# Patient Record
Sex: Female | Born: 1940 | Race: White | Hispanic: No | Marital: Married | Smoking: Former smoker
Health system: Southern US, Community
[De-identification: ages and names within clinical notes are randomized; demographics above are authoritative.]

## PROBLEM LIST (undated history)

## (undated) DIAGNOSIS — H35039 Hypertensive retinopathy, unspecified eye: Secondary | ICD-10-CM

## (undated) DIAGNOSIS — E7800 Pure hypercholesterolemia, unspecified: Secondary | ICD-10-CM

## (undated) DIAGNOSIS — H3530 Unspecified macular degeneration: Secondary | ICD-10-CM

## (undated) DIAGNOSIS — F32A Depression, unspecified: Secondary | ICD-10-CM

## (undated) DIAGNOSIS — J302 Other seasonal allergic rhinitis: Secondary | ICD-10-CM

## (undated) DIAGNOSIS — F419 Anxiety disorder, unspecified: Secondary | ICD-10-CM

## (undated) DIAGNOSIS — F329 Major depressive disorder, single episode, unspecified: Secondary | ICD-10-CM

## (undated) DIAGNOSIS — M1990 Unspecified osteoarthritis, unspecified site: Secondary | ICD-10-CM

## (undated) DIAGNOSIS — R112 Nausea with vomiting, unspecified: Secondary | ICD-10-CM

## (undated) DIAGNOSIS — H269 Unspecified cataract: Secondary | ICD-10-CM

## (undated) DIAGNOSIS — E039 Hypothyroidism, unspecified: Secondary | ICD-10-CM

## (undated) DIAGNOSIS — Z9889 Other specified postprocedural states: Secondary | ICD-10-CM

## (undated) DIAGNOSIS — I10 Essential (primary) hypertension: Secondary | ICD-10-CM

## (undated) HISTORY — DX: Pure hypercholesterolemia, unspecified: E78.00

## (undated) HISTORY — PX: UTERINE FIBROID SURGERY: SHX826

## (undated) HISTORY — DX: Depression, unspecified: F32.A

## (undated) HISTORY — DX: Hypertensive retinopathy, unspecified eye: H35.039

## (undated) HISTORY — DX: Unspecified osteoarthritis, unspecified site: M19.90

## (undated) HISTORY — DX: Essential (primary) hypertension: I10

## (undated) HISTORY — DX: Unspecified macular degeneration: H35.30

## (undated) HISTORY — DX: Hypothyroidism, unspecified: E03.9

## (undated) HISTORY — DX: Other seasonal allergic rhinitis: J30.2

## (undated) HISTORY — DX: Unspecified cataract: H26.9

## (undated) HISTORY — DX: Major depressive disorder, single episode, unspecified: F32.9

## (undated) HISTORY — DX: Anxiety disorder, unspecified: F41.9

## (undated) HISTORY — PX: TUBAL LIGATION: SHX77

---

## 1999-03-03 ENCOUNTER — Other Ambulatory Visit: Admission: RE | Admit: 1999-03-03 | Discharge: 1999-03-03 | Payer: Self-pay | Admitting: Obstetrics and Gynecology

## 1999-10-25 ENCOUNTER — Other Ambulatory Visit: Admission: RE | Admit: 1999-10-25 | Discharge: 1999-10-25 | Payer: Self-pay | Admitting: Obstetrics and Gynecology

## 2000-09-20 ENCOUNTER — Ambulatory Visit (HOSPITAL_COMMUNITY): Admission: RE | Admit: 2000-09-20 | Discharge: 2000-09-20 | Payer: Self-pay | Admitting: Obstetrics and Gynecology

## 2000-09-20 ENCOUNTER — Encounter (INDEPENDENT_AMBULATORY_CARE_PROVIDER_SITE_OTHER): Payer: Self-pay | Admitting: Specialist

## 2002-01-01 ENCOUNTER — Other Ambulatory Visit: Admission: RE | Admit: 2002-01-01 | Discharge: 2002-01-01 | Payer: Self-pay | Admitting: Obstetrics and Gynecology

## 2002-10-16 ENCOUNTER — Encounter: Payer: Self-pay | Admitting: Obstetrics and Gynecology

## 2002-10-16 ENCOUNTER — Encounter: Admission: RE | Admit: 2002-10-16 | Discharge: 2002-10-16 | Payer: Self-pay | Admitting: Obstetrics and Gynecology

## 2003-02-17 ENCOUNTER — Other Ambulatory Visit: Admission: RE | Admit: 2003-02-17 | Discharge: 2003-02-17 | Payer: Self-pay | Admitting: Obstetrics and Gynecology

## 2004-03-10 ENCOUNTER — Encounter: Admission: RE | Admit: 2004-03-10 | Discharge: 2004-03-10 | Payer: Self-pay | Admitting: Obstetrics and Gynecology

## 2005-07-13 ENCOUNTER — Encounter: Admission: RE | Admit: 2005-07-13 | Discharge: 2005-07-13 | Payer: Self-pay | Admitting: Obstetrics and Gynecology

## 2005-08-03 ENCOUNTER — Encounter: Admission: RE | Admit: 2005-08-03 | Discharge: 2005-08-03 | Payer: Self-pay | Admitting: Obstetrics and Gynecology

## 2007-01-03 ENCOUNTER — Encounter: Admission: RE | Admit: 2007-01-03 | Discharge: 2007-01-03 | Payer: Self-pay | Admitting: Obstetrics and Gynecology

## 2007-01-17 ENCOUNTER — Encounter: Admission: RE | Admit: 2007-01-17 | Discharge: 2007-01-17 | Payer: Self-pay | Admitting: Obstetrics and Gynecology

## 2008-01-30 ENCOUNTER — Encounter: Admission: RE | Admit: 2008-01-30 | Discharge: 2008-01-30 | Payer: Self-pay | Admitting: Obstetrics and Gynecology

## 2008-08-16 IMAGING — MG MM SCREEN MAMMOGRAM BILATERAL
4 series · 4 of 4 positions shown · non-contrast
Comparison: none

DG SCREEN MAMMOGRAM BILATERAL
Bilateral CC and MLO view(s) were taken.
Technologist: Ji, Anna Lia([email or web address])

DIGITAL SCREENING MAMMOGRAM WITH CAD:
There is a fibroglandular pattern.  A possible mass is noted in the left breast.  Spot compression 
views and possibly sonography are recommended for further evaluation.  In the right breast, no 
masses or malignant type calcifications are identified.  Compared with prior studies.

[R CC]
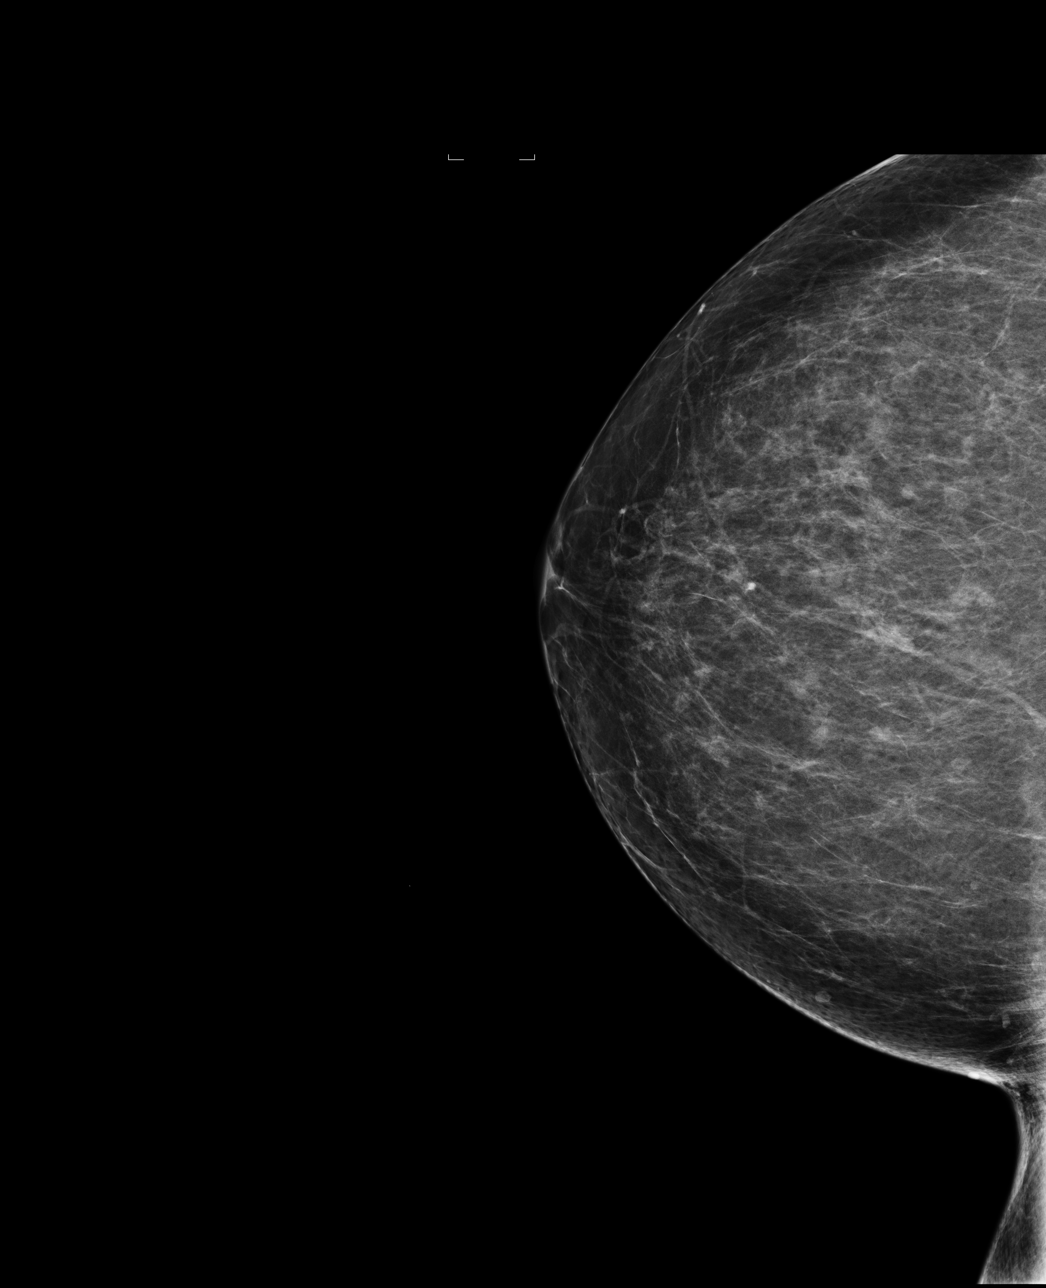

[L CC]
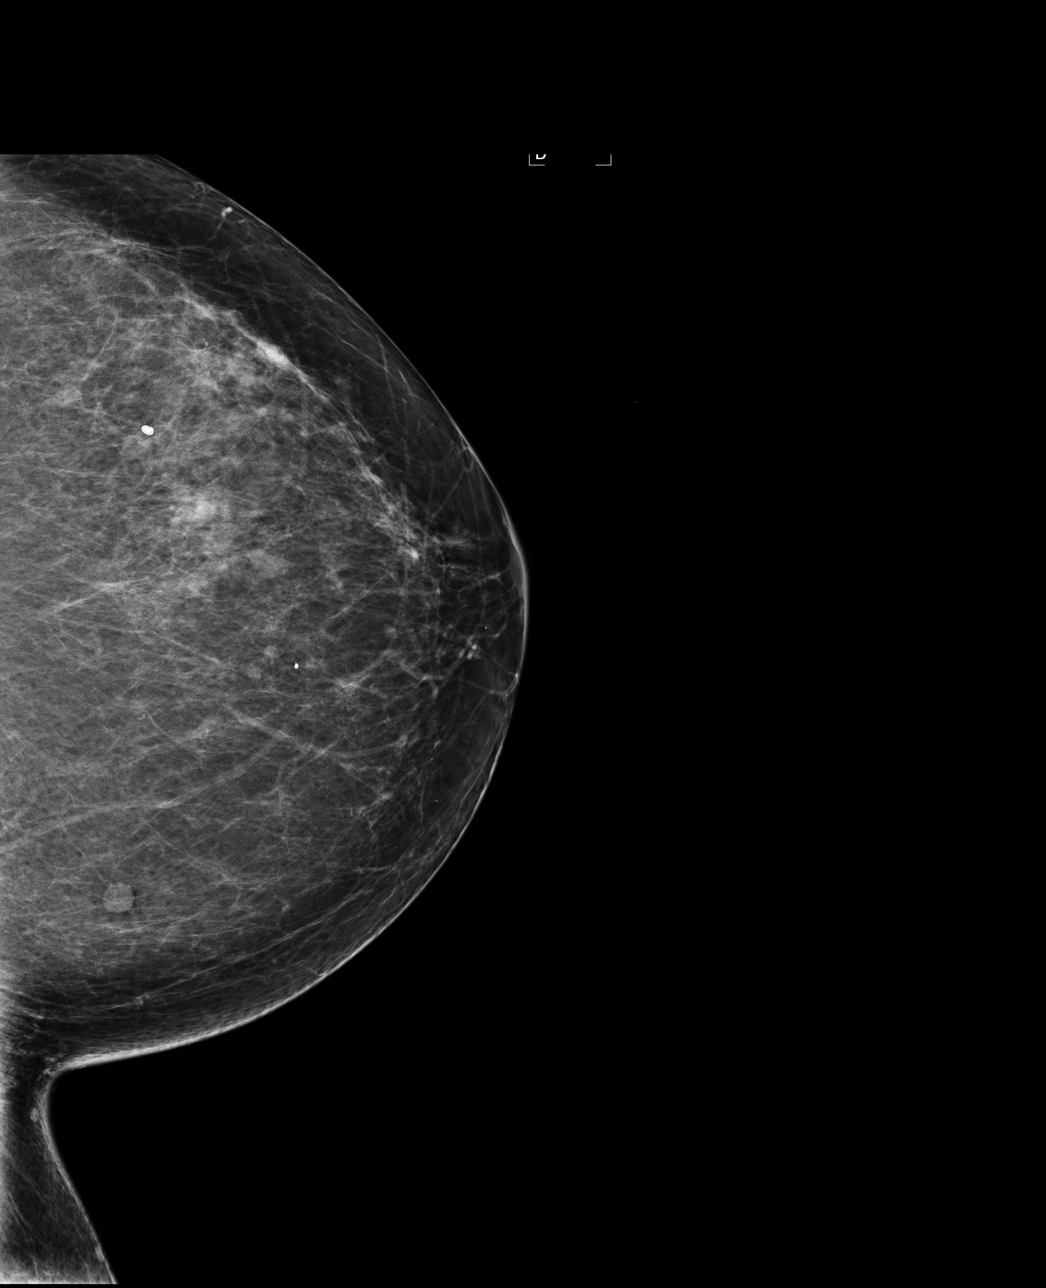

[L MLO]
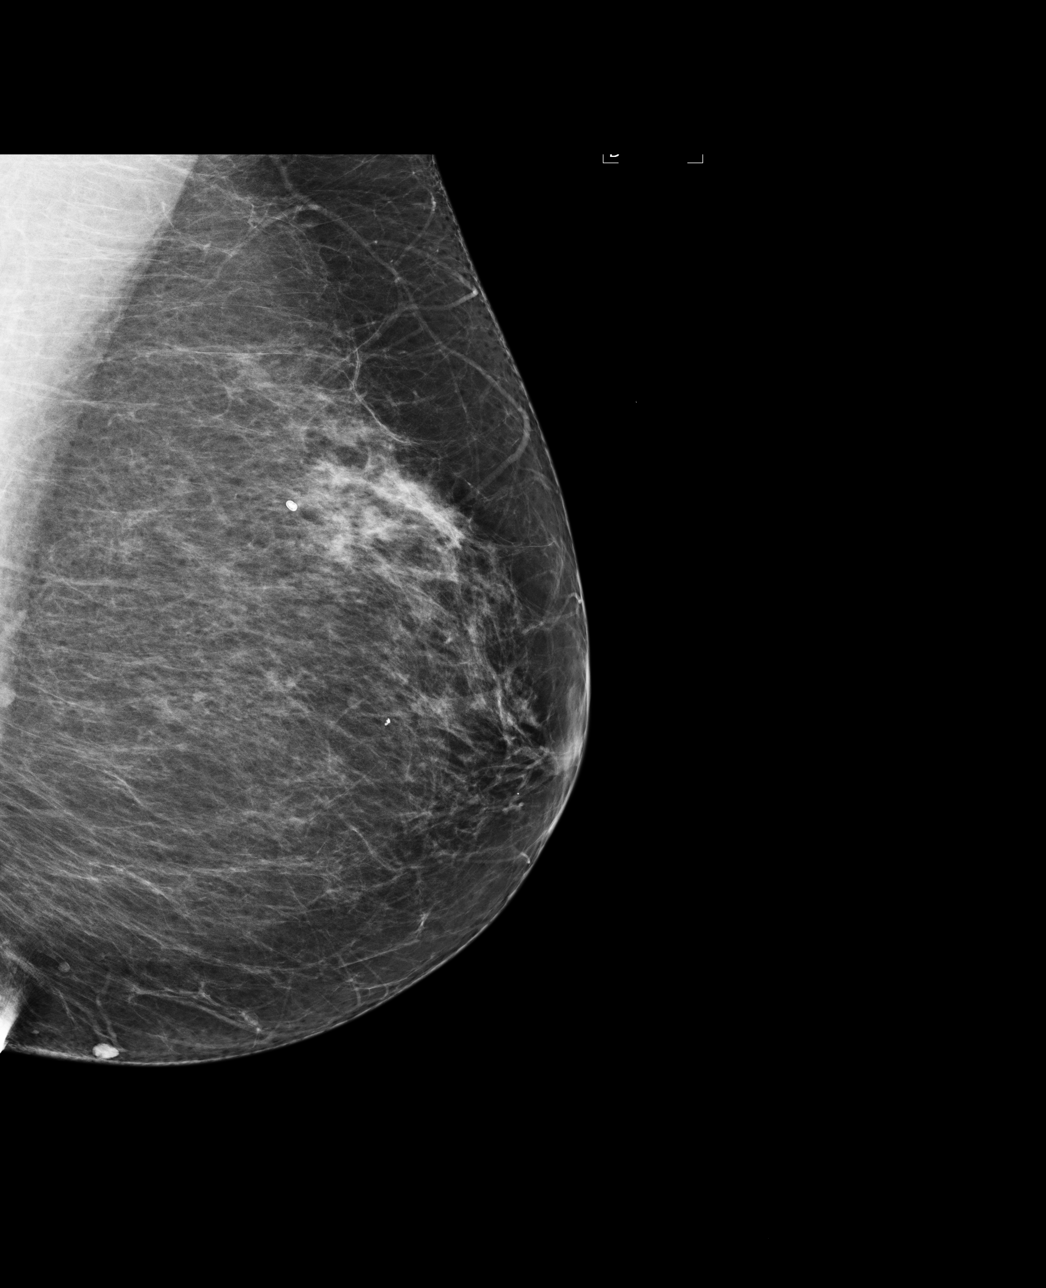

[R MLO]
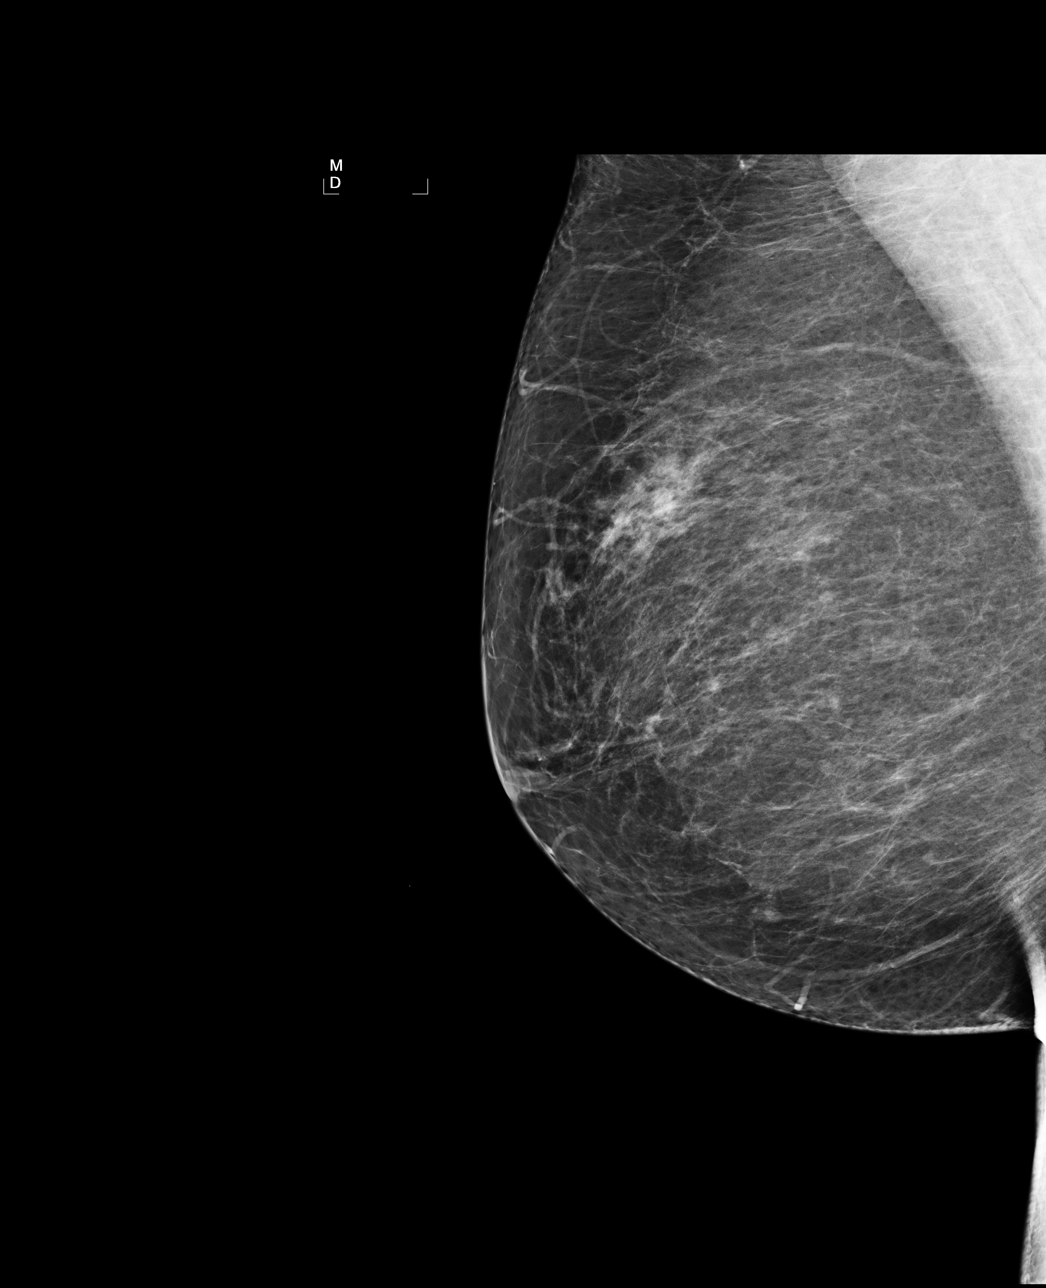

[4 of 4 positions shown; findings below may reference images not displayed]

IMPRESSION: Possible mass, left breast.  Additional evaluation is indicated.  The patient will be contacted for
additional studies and a supplementary report will follow.  No specific mammographic evidence of 
malignancy, right breast.

ASSESSMENT: Need additional imaging evaluation and/or prior mammograms for comparison - BI-RADS 0

Further imaging of the left breast.
ANALYZED BY COMPUTER AIDED DETECTION. , THIS PROCEDURE WAS A DIGITAL MAMMOGRAM.

## 2009-03-27 ENCOUNTER — Encounter: Admission: RE | Admit: 2009-03-27 | Discharge: 2009-03-27 | Payer: Self-pay | Admitting: Obstetrics and Gynecology

## 2010-04-02 ENCOUNTER — Encounter: Admission: RE | Admit: 2010-04-02 | Discharge: 2010-04-02 | Payer: Self-pay | Admitting: Obstetrics and Gynecology

## 2010-09-18 ENCOUNTER — Encounter: Payer: Self-pay | Admitting: Obstetrics and Gynecology

## 2010-09-19 ENCOUNTER — Encounter: Payer: Self-pay | Admitting: Obstetrics and Gynecology

## 2011-01-14 NOTE — Op Note (Signed)
[facility]  Patient:    Darlene Crawford, Darlene Crawford                        MRN: [id number] Proc. Date: 09/20/00 Attending:  Cordelia Pen A. Rosalio Macadamia, M.D.                           Operative Report  PREOPERATIVE DIAGNOSES:       1. Postmenopausal bleeding.                               2. Submucosal fibroid.   POSTOPERATIVE DIAGNOSES:      1. Postmenopausal bleeding.                               2. Submucosal fibroid.                               3. Endometrial polyp.  OPERATION/PROCEDURE:          1. Dilatation and curettage.                               2. Hysteroscopy.                               3. Resectoscopic excision of endometrial polyp                                  and submucosal fibroid.  SURGEON:                      Sherry A. Rosalio Macadamia, M.D.  ANESTHESIA:                   General.  INDICATIONS FOR PROCEDURE:    This patient is a 70 year old G5 P3, 0-2-2, female who has menstrual periods every month lasting six to seven days, on hormone replacement therapy.  The patient initially tried continuous hormone replacement therapy; however, she had ongoing bleeding.  Because of the ongoing bleeding she was changed to cyclical estrogen replacement therapy. Even on the estrogen replacement therapy in this fashion the patient had irregular bleeding between periods as well as her periods.  Because of that the patient underwent an ultrasound and the ultrasound revealed a 2 cm mass in the endometrial cavity.  Because of this the patient is brought to the operating room for dilatation and curettage, hysteroscopy, with resectoscope.  FINDINGS:                     1. Mid position normal size uterus.                               2. No adnexal mass.                               3. Posterior wall submucosal fibroid,  approximately 2 cm.                               4. Endometrial polyp present as well.  DESCRIPTION OF  PROCEDURE:     The patient was brought to the operating room and given adequate general anesthesia, and was placed in the dorsal lithotomy position on the operating room table.  The perineum and vagina were washed with Betadine and the bladder was in-and-out catheterized.  The patient was draped in sterile fashion and pelvic examination performed.  A speculum was placed within the vagina and the vagina washed with Betadine.  The anterior lip of the cervix was grasped with a single-tooth tenaculum and the cervix sounded.  The cervix was dilated with Shawnie Pons dilators to a #31 and the hysteroscope was easily introduced into the endometrial cavity.  Pictures were obtained.  Using a double loop right angle resector the endometrial polyp was removed first.  The submucosal fibroid was then removed in sheets.  Samples of the remaining portion of the endometrium were then excised.  Adequate hemostasis was present.  A final picture could not be obtained because the camera had gotten slightly wet and made it difficult to get an adequate picture.  Adequate hemostasis was present, however.  The resectoscope and tenaculum were removed from the vagina.  A small amount of bleeding from the tenaculum site was noted and Monsels solution was used to stop this bleeding. Adequate hemostasis was present.  All instruments were removed from the vagina.  The patient was taken out of the dorsal lithotomy position and was awakened.  She was moved from the operating room table to a stretcher in stable condition.  There were no complications.  Estimated blood loss was 5 cc.  Sorbitol differential was -90. DD:  09/20/00 TD:  09/20/00 Job: [id number]

## 2011-04-01 ENCOUNTER — Other Ambulatory Visit: Payer: Self-pay | Admitting: Internal Medicine

## 2011-04-01 DIAGNOSIS — Z1231 Encounter for screening mammogram for malignant neoplasm of breast: Secondary | ICD-10-CM

## 2011-04-22 ENCOUNTER — Ambulatory Visit
Admission: RE | Admit: 2011-04-22 | Discharge: 2011-04-22 | Disposition: A | Discharge status: Home / Self Care | Payer: PRIVATE HEALTH INSURANCE | Source: Ambulatory Visit | Attending: Internal Medicine | Admitting: Internal Medicine

## 2011-04-22 DIAGNOSIS — Z1231 Encounter for screening mammogram for malignant neoplasm of breast: Secondary | ICD-10-CM

## 2011-04-26 ENCOUNTER — Other Ambulatory Visit: Payer: Self-pay | Admitting: Internal Medicine

## 2011-04-26 DIAGNOSIS — R928 Other abnormal and inconclusive findings on diagnostic imaging of breast: Secondary | ICD-10-CM

## 2011-05-03 ENCOUNTER — Ambulatory Visit
Admission: RE | Admit: 2011-05-03 | Discharge: 2011-05-03 | Disposition: A | Discharge status: Home / Self Care | Payer: PRIVATE HEALTH INSURANCE | Source: Ambulatory Visit | Attending: Internal Medicine | Admitting: Internal Medicine

## 2011-05-03 ENCOUNTER — Other Ambulatory Visit: Payer: Self-pay | Admitting: Internal Medicine

## 2011-05-03 DIAGNOSIS — R928 Other abnormal and inconclusive findings on diagnostic imaging of breast: Secondary | ICD-10-CM

## 2012-04-27 ENCOUNTER — Other Ambulatory Visit: Payer: Self-pay | Admitting: Internal Medicine

## 2012-04-27 DIAGNOSIS — R928 Other abnormal and inconclusive findings on diagnostic imaging of breast: Secondary | ICD-10-CM

## 2012-04-27 DIAGNOSIS — N6320 Unspecified lump in the left breast, unspecified quadrant: Secondary | ICD-10-CM

## 2012-05-17 ENCOUNTER — Ambulatory Visit
Admission: RE | Admit: 2012-05-17 | Discharge: 2012-05-17 | Disposition: A | Discharge status: Home / Self Care | Payer: Medicare Other | Source: Ambulatory Visit | Attending: Internal Medicine | Admitting: Internal Medicine

## 2012-05-17 ENCOUNTER — Other Ambulatory Visit: Payer: Self-pay | Admitting: Internal Medicine

## 2012-05-17 DIAGNOSIS — Z1231 Encounter for screening mammogram for malignant neoplasm of breast: Secondary | ICD-10-CM

## 2012-05-17 DIAGNOSIS — R928 Other abnormal and inconclusive findings on diagnostic imaging of breast: Secondary | ICD-10-CM

## 2012-05-17 DIAGNOSIS — N6320 Unspecified lump in the left breast, unspecified quadrant: Secondary | ICD-10-CM

## 2013-07-09 ENCOUNTER — Other Ambulatory Visit (HOSPITAL_COMMUNITY): Payer: Self-pay | Admitting: Internal Medicine

## 2013-07-09 DIAGNOSIS — Z139 Encounter for screening, unspecified: Secondary | ICD-10-CM

## 2013-07-16 ENCOUNTER — Ambulatory Visit (HOSPITAL_COMMUNITY): Payer: Medicare Other

## 2013-08-01 ENCOUNTER — Ambulatory Visit (HOSPITAL_COMMUNITY)
Admission: RE | Admit: 2013-08-01 | Discharge: 2013-08-01 | Disposition: A | Discharge status: Home / Self Care | Payer: Medicare Other | Source: Ambulatory Visit | Attending: Internal Medicine | Admitting: Internal Medicine

## 2013-08-01 DIAGNOSIS — Z139 Encounter for screening, unspecified: Secondary | ICD-10-CM

## 2013-08-01 DIAGNOSIS — Z1231 Encounter for screening mammogram for malignant neoplasm of breast: Secondary | ICD-10-CM | POA: Insufficient documentation

## 2013-08-05 ENCOUNTER — Other Ambulatory Visit: Payer: Self-pay | Admitting: Internal Medicine

## 2013-08-05 DIAGNOSIS — R928 Other abnormal and inconclusive findings on diagnostic imaging of breast: Secondary | ICD-10-CM

## 2013-08-09 ENCOUNTER — Telehealth: Payer: Self-pay

## 2013-08-09 NOTE — Telephone Encounter (Signed)
Pt was referred by Dr. Sherryll Burger for screening colonoscopy. She said she had one about 20 years ago by a Careers adviser, can't remember his name, think he was in the office with Dr. Gabriel Cirri.   She is not having any problems and she is taking care of her husband and son who have medical problems.  Said she will call first of the year when she can schedule. I will let Dr. Sherryll Burger know.

## 2013-08-28 ENCOUNTER — Ambulatory Visit (HOSPITAL_COMMUNITY)
Admission: RE | Admit: 2013-08-28 | Discharge: 2013-08-28 | Disposition: A | Discharge status: Home / Self Care | Payer: Medicare Other | Source: Ambulatory Visit | Attending: Internal Medicine | Admitting: Internal Medicine

## 2013-08-28 ENCOUNTER — Other Ambulatory Visit: Payer: Self-pay | Admitting: Internal Medicine

## 2013-08-28 DIAGNOSIS — R928 Other abnormal and inconclusive findings on diagnostic imaging of breast: Secondary | ICD-10-CM

## 2013-09-02 ENCOUNTER — Telehealth: Payer: Self-pay

## 2013-09-02 NOTE — Telephone Encounter (Signed)
LMOM to call.

## 2013-09-24 NOTE — Telephone Encounter (Signed)
Letter reminder sent to pt to call to schedule colonoscopy.

## 2014-03-31 ENCOUNTER — Other Ambulatory Visit (HOSPITAL_COMMUNITY): Payer: Self-pay | Admitting: Nurse Practitioner

## 2014-03-31 DIAGNOSIS — R928 Other abnormal and inconclusive findings on diagnostic imaging of breast: Secondary | ICD-10-CM

## 2014-04-22 ENCOUNTER — Ambulatory Visit (HOSPITAL_COMMUNITY)
Admission: RE | Admit: 2014-04-22 | Discharge: 2014-04-22 | Disposition: A | Discharge status: Home / Self Care | Payer: Medicare HMO | Source: Ambulatory Visit | Attending: Nurse Practitioner | Admitting: Nurse Practitioner

## 2014-04-22 DIAGNOSIS — R928 Other abnormal and inconclusive findings on diagnostic imaging of breast: Secondary | ICD-10-CM | POA: Diagnosis not present

## 2016-04-01 ENCOUNTER — Other Ambulatory Visit (HOSPITAL_COMMUNITY): Payer: Self-pay | Admitting: Nurse Practitioner

## 2016-04-01 DIAGNOSIS — Z1231 Encounter for screening mammogram for malignant neoplasm of breast: Secondary | ICD-10-CM

## 2016-04-11 ENCOUNTER — Ambulatory Visit (HOSPITAL_COMMUNITY)
Admission: RE | Admit: 2016-04-11 | Discharge: 2016-04-11 | Disposition: A | Discharge status: Home / Self Care | Payer: Medicare HMO | Source: Ambulatory Visit | Attending: Nurse Practitioner | Admitting: Nurse Practitioner

## 2016-04-11 DIAGNOSIS — Z1231 Encounter for screening mammogram for malignant neoplasm of breast: Secondary | ICD-10-CM | POA: Insufficient documentation

## 2016-11-17 DIAGNOSIS — E7800 Pure hypercholesterolemia, unspecified: Secondary | ICD-10-CM | POA: Diagnosis not present

## 2016-11-17 DIAGNOSIS — I10 Essential (primary) hypertension: Secondary | ICD-10-CM | POA: Diagnosis not present

## 2016-11-17 DIAGNOSIS — R69 Illness, unspecified: Secondary | ICD-10-CM | POA: Diagnosis not present

## 2016-11-17 DIAGNOSIS — E039 Hypothyroidism, unspecified: Secondary | ICD-10-CM | POA: Diagnosis not present

## 2016-11-17 DIAGNOSIS — Z713 Dietary counseling and surveillance: Secondary | ICD-10-CM | POA: Diagnosis not present

## 2016-11-17 DIAGNOSIS — Z6831 Body mass index (BMI) 31.0-31.9, adult: Secondary | ICD-10-CM | POA: Diagnosis not present

## 2016-11-17 DIAGNOSIS — Z299 Encounter for prophylactic measures, unspecified: Secondary | ICD-10-CM | POA: Diagnosis not present

## 2017-01-11 DIAGNOSIS — R69 Illness, unspecified: Secondary | ICD-10-CM | POA: Diagnosis not present

## 2017-02-21 DIAGNOSIS — E039 Hypothyroidism, unspecified: Secondary | ICD-10-CM | POA: Diagnosis not present

## 2017-02-21 DIAGNOSIS — Z299 Encounter for prophylactic measures, unspecified: Secondary | ICD-10-CM | POA: Diagnosis not present

## 2017-02-21 DIAGNOSIS — M8580 Other specified disorders of bone density and structure, unspecified site: Secondary | ICD-10-CM | POA: Diagnosis not present

## 2017-02-21 DIAGNOSIS — I10 Essential (primary) hypertension: Secondary | ICD-10-CM | POA: Diagnosis not present

## 2017-02-21 DIAGNOSIS — R69 Illness, unspecified: Secondary | ICD-10-CM | POA: Diagnosis not present

## 2017-02-21 DIAGNOSIS — E7800 Pure hypercholesterolemia, unspecified: Secondary | ICD-10-CM | POA: Diagnosis not present

## 2017-02-21 DIAGNOSIS — Z1389 Encounter for screening for other disorder: Secondary | ICD-10-CM | POA: Diagnosis not present

## 2017-02-21 DIAGNOSIS — E559 Vitamin D deficiency, unspecified: Secondary | ICD-10-CM | POA: Diagnosis not present

## 2017-02-21 DIAGNOSIS — Z7189 Other specified counseling: Secondary | ICD-10-CM | POA: Diagnosis not present

## 2017-02-21 DIAGNOSIS — Z79899 Other long term (current) drug therapy: Secondary | ICD-10-CM | POA: Diagnosis not present

## 2017-02-21 DIAGNOSIS — Z87891 Personal history of nicotine dependence: Secondary | ICD-10-CM | POA: Diagnosis not present

## 2017-02-21 DIAGNOSIS — Z0000 Encounter for general adult medical examination without abnormal findings: Secondary | ICD-10-CM | POA: Diagnosis not present

## 2017-02-21 DIAGNOSIS — Z6831 Body mass index (BMI) 31.0-31.9, adult: Secondary | ICD-10-CM | POA: Diagnosis not present

## 2017-03-16 DIAGNOSIS — R69 Illness, unspecified: Secondary | ICD-10-CM | POA: Diagnosis not present

## 2017-05-22 ENCOUNTER — Other Ambulatory Visit (HOSPITAL_COMMUNITY): Payer: Self-pay | Admitting: Nurse Practitioner

## 2017-05-22 DIAGNOSIS — E039 Hypothyroidism, unspecified: Secondary | ICD-10-CM | POA: Diagnosis not present

## 2017-05-22 DIAGNOSIS — Z6832 Body mass index (BMI) 32.0-32.9, adult: Secondary | ICD-10-CM | POA: Diagnosis not present

## 2017-05-22 DIAGNOSIS — Z1231 Encounter for screening mammogram for malignant neoplasm of breast: Secondary | ICD-10-CM

## 2017-05-22 DIAGNOSIS — E7800 Pure hypercholesterolemia, unspecified: Secondary | ICD-10-CM | POA: Diagnosis not present

## 2017-05-22 DIAGNOSIS — R69 Illness, unspecified: Secondary | ICD-10-CM | POA: Diagnosis not present

## 2017-05-22 DIAGNOSIS — I10 Essential (primary) hypertension: Secondary | ICD-10-CM | POA: Diagnosis not present

## 2017-05-22 DIAGNOSIS — Z299 Encounter for prophylactic measures, unspecified: Secondary | ICD-10-CM | POA: Diagnosis not present

## 2017-05-26 ENCOUNTER — Ambulatory Visit (HOSPITAL_COMMUNITY): Payer: Medicare HMO

## 2017-06-01 ENCOUNTER — Ambulatory Visit (HOSPITAL_COMMUNITY)
Admission: RE | Admit: 2017-06-01 | Discharge: 2017-06-01 | Disposition: A | Discharge status: Home / Self Care | Payer: Medicare HMO | Source: Ambulatory Visit | Attending: Nurse Practitioner | Admitting: Nurse Practitioner

## 2017-06-01 DIAGNOSIS — Z1231 Encounter for screening mammogram for malignant neoplasm of breast: Secondary | ICD-10-CM | POA: Insufficient documentation

## 2017-08-02 DIAGNOSIS — H2513 Age-related nuclear cataract, bilateral: Secondary | ICD-10-CM | POA: Diagnosis not present

## 2017-08-02 DIAGNOSIS — H5203 Hypermetropia, bilateral: Secondary | ICD-10-CM | POA: Diagnosis not present

## 2017-08-02 DIAGNOSIS — I10 Essential (primary) hypertension: Secondary | ICD-10-CM | POA: Diagnosis not present

## 2017-08-02 DIAGNOSIS — D3131 Benign neoplasm of right choroid: Secondary | ICD-10-CM | POA: Diagnosis not present

## 2017-08-02 DIAGNOSIS — H353121 Nonexudative age-related macular degeneration, left eye, early dry stage: Secondary | ICD-10-CM | POA: Diagnosis not present

## 2017-08-02 DIAGNOSIS — H35033 Hypertensive retinopathy, bilateral: Secondary | ICD-10-CM | POA: Diagnosis not present

## 2017-08-02 DIAGNOSIS — H353112 Nonexudative age-related macular degeneration, right eye, intermediate dry stage: Secondary | ICD-10-CM | POA: Diagnosis not present

## 2017-08-02 DIAGNOSIS — H52223 Regular astigmatism, bilateral: Secondary | ICD-10-CM | POA: Diagnosis not present

## 2017-08-02 DIAGNOSIS — H524 Presbyopia: Secondary | ICD-10-CM | POA: Diagnosis not present

## 2017-08-02 DIAGNOSIS — H35463 Secondary vitreoretinal degeneration, bilateral: Secondary | ICD-10-CM | POA: Diagnosis not present

## 2017-08-31 DIAGNOSIS — E7800 Pure hypercholesterolemia, unspecified: Secondary | ICD-10-CM | POA: Diagnosis not present

## 2017-08-31 DIAGNOSIS — Z87891 Personal history of nicotine dependence: Secondary | ICD-10-CM | POA: Diagnosis not present

## 2017-08-31 DIAGNOSIS — Z299 Encounter for prophylactic measures, unspecified: Secondary | ICD-10-CM | POA: Diagnosis not present

## 2017-08-31 DIAGNOSIS — R69 Illness, unspecified: Secondary | ICD-10-CM | POA: Diagnosis not present

## 2017-08-31 DIAGNOSIS — Z6832 Body mass index (BMI) 32.0-32.9, adult: Secondary | ICD-10-CM | POA: Diagnosis not present

## 2017-08-31 DIAGNOSIS — M5030 Other cervical disc degeneration, unspecified cervical region: Secondary | ICD-10-CM | POA: Diagnosis not present

## 2017-08-31 DIAGNOSIS — I10 Essential (primary) hypertension: Secondary | ICD-10-CM | POA: Diagnosis not present

## 2017-08-31 DIAGNOSIS — E039 Hypothyroidism, unspecified: Secondary | ICD-10-CM | POA: Diagnosis not present

## 2017-09-26 DIAGNOSIS — H35463 Secondary vitreoretinal degeneration, bilateral: Secondary | ICD-10-CM | POA: Diagnosis not present

## 2017-09-26 DIAGNOSIS — D3131 Benign neoplasm of right choroid: Secondary | ICD-10-CM | POA: Diagnosis not present

## 2017-09-26 DIAGNOSIS — H2513 Age-related nuclear cataract, bilateral: Secondary | ICD-10-CM | POA: Diagnosis not present

## 2017-09-26 DIAGNOSIS — H35412 Lattice degeneration of retina, left eye: Secondary | ICD-10-CM | POA: Diagnosis not present

## 2017-09-26 DIAGNOSIS — H353121 Nonexudative age-related macular degeneration, left eye, early dry stage: Secondary | ICD-10-CM | POA: Diagnosis not present

## 2017-09-26 DIAGNOSIS — H353112 Nonexudative age-related macular degeneration, right eye, intermediate dry stage: Secondary | ICD-10-CM | POA: Diagnosis not present

## 2017-09-26 DIAGNOSIS — H35021 Exudative retinopathy, right eye: Secondary | ICD-10-CM | POA: Diagnosis not present

## 2017-09-28 NOTE — Progress Notes (Signed)
[facility] Note  09/29/2017     CHIEF COMPLAINT Patient presents for Retina Evaluation   HISTORY OF PRESENT ILLNESS: Darlene Crawford is a 77 y.o. female who presents to the clinic today for:   HPI    Retina Evaluation    In both eyes.  This started 1 week ago.  Duration of 24 hours.  Associated Symptoms Distortion.  Negative for Floaters, Redness, Scalp Tenderness, Weight Loss, Fever, Trauma, Pain, Flashes, Photophobia, Jaw Claudication, Fatigue, Blind Spot, Glare and Shoulder/Hip pain.  Context:  distance vision, mid-range vision, near vision, reading, watching TV and computer work.  Treatments tried include artificial tears.  Response to treatment was no improvement.  I, the attending physician,  performed the HPI with the patient and updated documentation appropriately.          Comments    Referral of Dr. Rosana Hoes for retina evaluation. Patient states for appx. one week she has noticed  discoloration in her central vision of the right eye. Denies wavy vision, glare, ocular pain. Pt uses Dry eye Gtts Prn. Pt takes PreserVision QD       Last edited by Bernarda Caffey, MD on 09/29/2017 11:07 AM. (History)    Pt states she saw Dr. Rosana Hoes due to having a change in [place] grid; Pt states she was dx with ARMD OU x 1 year ago; Pt reports bing on medication for HTN, elevated chol, and thyroid; Pt states she began taking an antiinflammatories for arthritis; Pt reports she is also taking melatonin for a sleep aid; Pt states she had an eye exam in December and states "everything was normal"; Pt states BP is "usually normal";   Referring physician: Leticia Clas, DO [address] [place]. [place], [place] [address]  HISTORICAL INFORMATION:   Selected notes from the MEDICAL RECORD NUMBER Referred by Dr. Leander Rams for concern of ARMD OD with progression to CNVM vs CSR;  LEE- 01.29.19 (R. Davis) [BCVA OD: 20/100 OS: 20/30] Ocular Hx- AMD OU, cataract OU, DES OU, HTN  retinopathy OU, WWP OU, choroidal nevus OD;  PMH- elevated chol, HTN, hyperthyoridism    CURRENT MEDICATIONS: Current Outpatient Medications (Ophthalmic Drugs)  Medication Sig  . Artificial Tear Ointment (DRY EYES OP) Apply to eye.   No current facility-administered medications for this visit.  (Ophthalmic Drugs)   Current Outpatient Medications (Other)  Medication Sig  . b complex vitamins capsule Take 1 capsule by mouth daily.  . calcium citrate-vitamin D (CITRACAL+D) 315-200 MG-UNIT tablet Take 1 tablet by mouth 2 (two) times daily.  . diclofenac (VOLTAREN) 50 MG EC tablet   . hydrochlorothiazide (HYDRODIURIL) 25 MG tablet   . levothyroxine (SYNTHROID, LEVOTHROID) 100 MCG tablet Take 100 mcg by mouth daily before breakfast.  . losartan (COZAAR) 100 MG tablet   . Melatonin-Pyridoxine (MELATIN PO) Take by mouth.  . Multiple Vitamins-Minerals (PRESERVISION AREDS 2 PO) Take by mouth.  . sertraline (ZOLOFT) 50 MG tablet Take 50 mg by mouth daily.   No current facility-administered medications for this visit.  (Other)      REVIEW OF SYSTEMS: ROS    Positive for: Musculoskeletal, Cardiovascular, Eyes   Negative for: Constitutional, Gastrointestinal, Neurological, Skin, Genitourinary, HENT, Endocrine, Respiratory, Psychiatric, Allergic/Imm, Heme/Lymph   Last edited by Zenovia Jordan, LPN on 05/02/7653  6:50 AM. (History)       ALLERGIES No Known Allergies  PAST MEDICAL HISTORY Past Medical History:  Diagnosis Date  . Hypertension   . Osteoarthritis  Past Surgical History:  Procedure Laterality Date  . TUBAL LIGATION    . UTERINE FIBROID SURGERY      FAMILY HISTORY Family History  Problem Relation Age of Onset  . Cancer Mother        lung  . Stroke Father   . Stroke Brother     SOCIAL HISTORY Social History   Tobacco Use  . Smoking status: Never Smoker  . Smokeless tobacco: Never Used  Substance Use Topics  . Alcohol use: No    Frequency: Never  .  Drug use: No         OPHTHALMIC EXAM:  Base Eye Exam    Visual Acuity (Snellen - Linear)      Right Left   Dist cc 20/30 -1 20/20 -1   Dist ph cc 20/25 +2 NI   Correction:  Glasses       Tonometry (Tonopen, 9:58 AM)      Right Left   Pressure 13 13       Pupils      Dark Light Shape React APD   Right 5 4 Round Brisk None   Left 5 4 Round Brisk None       Visual Fields (Counting fingers)      Left Right    Full Full       Extraocular Movement      Right Left    Full, Ortho Full, Ortho       Neuro/Psych    Oriented x3:  Yes   Mood/Affect:  Normal       Dilation    Both eyes:  1.0% Mydriacyl, 2.5% Phenylephrine @ 9:59 AM        Slit Lamp and Fundus Exam    Slit Lamp Exam      Right Left   Lids/Lashes Dermatochalasis - upper lid Dermatochalasis - upper lid   Conjunctiva/Sclera White and quiet White and quiet   Cornea Arcus, early nasal band K Arcus, early nasal band K   Anterior Chamber moderate depth, with narrow angle temporally moderate depth, with narrow angle temporally   Iris Round and well dilated Round and well dilated   Lens 2+ Nuclear sclerosis, 2+ Cortical cataract 2+ Nuclear sclerosis, 2+ Cortical cataract   Vitreous Vitreous syneresis, Posterior vitreous detachment, Weiss ring Vitreous syneresis       Fundus Exam      Right Left   Disc Normal Normal   C/D Ratio 0.3 0.3   Macula Blunted foveal reflex, +SRF, Drusen, no heme Flat, Retinal pigment epithelial mottling,superior para-foveal focal area of RPE atrophy, rare drusen, No heme or edema   Vessels Copper wiring, AV crossing changes, mildly Tortuous Copper wiring, AV crossing changes, mildly Tortuous   Periphery Attached, peripheral drusen / RPE changes nasally Patch of Lattice degeneration at 0600, scattered peripheral drusen, peripheral cystoid degeneration          IMAGING AND PROCEDURES  Imaging and Procedures for 09/29/17  OCT, Retina - OU - Both Eyes     Right Eye Quality  was good. Central Foveal Thickness: 413. Progression has no prior data. Findings include abnormal foveal contour, subretinal fluid, no IRF, retinal drusen , pigment epithelial detachment.   Left Eye Quality was good. Central Foveal Thickness: 276. Progression has no prior data. Findings include normal foveal contour, no IRF, no SRF (Rare drusen).   Notes *Images captured and stored on drive  Diagnosis / Impression:  OD: + central SRF OS: NFP, No IRF/SRF  Clinical management:  See below  Abbreviations: NFP - Normal foveal profile. CME - cystoid macular edema. PED - pigment epithelial detachment. IRF - intraretinal fluid. SRF - subretinal fluid. EZ - ellipsoid zone. ERM - epiretinal membrane. ORA - outer retinal atrophy. ORT - outer retinal tubulation. SRHM - subretinal hyper-reflective material         Fluorescein Angiography Optos (Transit OD)     Right Eye Early phase findings include normal observations, microaneurysm. Mid/Late phase findings include normal observations. Choroidal neovascularization is not present.   Left Eye Early phase findings include normal observations. Mid/Late phase findings include normal observations. Choroidal neovascularization is not present.   Notes Impression:  OD: relatively normal study. Mild peripheral Mas. No obvious CNV OS: normal study. No CNV                ASSESSMENT/PLAN:    ICD-10-CM   1. Exudative age-related macular degeneration of right eye with active choroidal neovascularization (HCC) H35.3211 Fluorescein Angiography Optos (Transit OD)  2. Retinal edema H35.81 OCT, Retina - OU - Both Eyes  3. Essential hypertension I10   4. Hypertensive retinopathy of both eyes H35.033 Fluorescein Angiography Optos (Transit OD)  5. 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 Posterior vitreous detachment of right eye H43.811   7. 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 Exudative age related macular  degeneration vs CSCR OD    - OCT with + central SRF  - FA without leakage / pooling into area of SRF  - VA remains 20/25  - discussed findings and prognosis  - review of systems reveals significant stressors in life, but no exogenous steroid use  - discussed possibility of treatment with anti-VEGF should symptoms worsen  - recommend monitoring for now -- pt in agreement with plan  - f/u in 3-4 wks -- DFE/OCT/possible repeat FA on [place]  3,4. Hypertensive retinopathy OU - discussed importance of tight BP control - monitor  5. Lattice degeneration OS- - patch of inferior lattice OS -- no RT, holes or SRF - discussed findings, prognosis, and treatment options including observation - monitor  6. PVD / vitreous syneresis OD-   Discussed findings and prognosis  No RT or RD on 360 peripheral exam  Reviewed s/s of RT/RD  Strict return precautions for any such RT/RD signs/symptoms  7. Combined form age-related cataract OU-  - The symptoms of cataract, surgical options, and treatments and risks were discussed with patient. - discussed diagnosis and progression - not yet visually significant - monitor for now  Ophthalmic Meds Ordered this visit:  No orders of the defined types were placed in this encounter.      Return for 3-4 wks, Dilated Exam, OCT.  There are no Patient Instructions on file for this visit.   Explained the diagnoses, plan, and follow up with the patient and they expressed understanding.  Patient expressed understanding of the importance of proper follow up care.   This document serves as a record of services personally performed by Gardiner Sleeper, MD, PhD. It was created on their behalf by Catha Brow, [place], a certified ophthalmic assistant. The creation of this record is the provider's dictation and/or activities during the visit.  Electronically signed by: Catha Brow, COA  09/29/17 3:14 PM   Gardiner Sleeper, M.D., Ph.D. Diseases & Surgery of  the Retina and [place] 09/29/17  I have reviewed the above documentation for  accuracy and completeness, and I agree with the above. Gardiner Sleeper, M.D., Ph.D. 09/29/17 3:14 PM     Abbreviations: M myopia (nearsighted); A astigmatism; H hyperopia (farsighted); P presbyopia; Mrx spectacle prescription;  CTL contact lenses; OD right eye; OS left eye; OU both eyes  XT exotropia; ET esotropia; PEK punctate epithelial keratitis; PEE punctate epithelial erosions; DES dry eye syndrome; MGD meibomian gland dysfunction; ATs artificial tears; PFAT's preservative free artificial tears; [place] nuclear sclerotic cataract; PSC posterior subcapsular cataract; ERM epi-retinal membrane; PVD posterior vitreous detachment; RD retinal detachment; DM diabetes mellitus; DR diabetic retinopathy; NPDR non-proliferative diabetic retinopathy; PDR proliferative diabetic retinopathy; CSME clinically significant macular edema; DME diabetic macular edema; dbh dot blot hemorrhages; CWS cotton wool spot; POAG primary open angle glaucoma; C/D cup-to-disc ratio; HVF humphrey visual field; GVF goldmann visual field; OCT optical coherence tomography; IOP intraocular pressure; BRVO Branch retinal vein occlusion; CRVO central retinal vein occlusion; CRAO central retinal artery occlusion; BRAO branch retinal artery occlusion; RT retinal tear; SB scleral buckle; PPV pars plana vitrectomy; VH Vitreous hemorrhage; PRP panretinal laser photocoagulation; IVK intravitreal kenalog; VMT vitreomacular traction; MH Macular hole;  NVD neovascularization of the disc; NVE neovascularization elsewhere; AREDS age related eye disease study; ARMD age related macular degeneration; POAG primary open angle glaucoma; EBMD epithelial/anterior basement membrane dystrophy; ACIOL anterior chamber intraocular lens; IOL intraocular lens; PCIOL posterior chamber intraocular lens; Phaco/IOL phacoemulsification with intraocular lens  placement; [place] photorefractive keratectomy; LASIK laser assisted in situ keratomileusis; HTN hypertension; DM diabetes mellitus; COPD chronic obstructive pulmonary disease

## 2017-09-29 ENCOUNTER — Ambulatory Visit (INDEPENDENT_AMBULATORY_CARE_PROVIDER_SITE_OTHER): Payer: Medicare HMO | Admitting: Ophthalmology

## 2017-09-29 ENCOUNTER — Encounter (INDEPENDENT_AMBULATORY_CARE_PROVIDER_SITE_OTHER): Payer: Self-pay | Admitting: Ophthalmology

## 2017-09-29 DIAGNOSIS — H3581 Retinal edema: Secondary | ICD-10-CM

## 2017-09-29 DIAGNOSIS — H353211 Exudative age-related macular degeneration, right eye, with active choroidal neovascularization: Secondary | ICD-10-CM

## 2017-09-29 DIAGNOSIS — H25813 Combined forms of age-related cataract, bilateral: Secondary | ICD-10-CM | POA: Diagnosis not present

## 2017-09-29 DIAGNOSIS — H43811 Vitreous degeneration, right eye: Secondary | ICD-10-CM

## 2017-09-29 DIAGNOSIS — H35412 Lattice degeneration of retina, left eye: Secondary | ICD-10-CM

## 2017-09-29 DIAGNOSIS — H35033 Hypertensive retinopathy, bilateral: Secondary | ICD-10-CM

## 2017-09-29 DIAGNOSIS — I10 Essential (primary) hypertension: Secondary | ICD-10-CM

## 2017-10-26 NOTE — Progress Notes (Signed)
[facility] Note  10/27/2017     CHIEF COMPLAINT Patient presents for Retina Follow Up   HISTORY OF PRESENT ILLNESS: Darlene Crawford is a 77 y.o. female who presents to the clinic today for:   HPI    Retina Follow Up    Patient presents with  Wet AMD.  In right eye.  This started 3 weeks ago.  Severity is mild.  Since onset it is gradually improving.  I, the attending physician,  performed the HPI with the patient and updated documentation appropriately.          Comments    F/U EXU AMD vs CSR OD. Patient states "she does not have as much of a dark area any more, discoloration has become lighter OD, her vision is improving gradually". Pt is taking Quill oil , PreserVision Qd  ,using Dry eye Gtt's PRN       Last edited by Bernarda Caffey, MD on 10/27/2017 11:31 AM. (History)    Pt states she feels OD VA is improving; Pt states she feels "the dark area is lighter";   Referring physician: Monico Blitz, MD [place], [place] [address]  HISTORICAL INFORMATION:   Selected notes from the MEDICAL RECORD NUMBER Referred by Dr. Leander Rams for concern of ARMD OD with progression to CNVM vs CSR;  LEE- 01.29.19 ([facility]. [facility]) [BCVA OD: 20/100 OS: 20/30] Ocular Hx- AMD OU, cataract OU, DES OU, HTN retinopathy OU, WWP OU, choroidal nevus OD;  PMH- elevated chol, HTN, hyperthyoridism    CURRENT MEDICATIONS: Current Outpatient Medications (Ophthalmic Drugs)  Medication Sig  . Artificial Tear Ointment (DRY EYES OP) Apply to eye.   No current facility-administered medications for this visit.  (Ophthalmic Drugs)   Current Outpatient Medications (Other)  Medication Sig  . b complex vitamins capsule Take 1 capsule by mouth daily.  . calcium citrate-vitamin D (CITRACAL+D) 315-200 MG-UNIT tablet Take 1 tablet by mouth 2 (two) times daily.  . diclofenac (VOLTAREN) 50 MG EC tablet   . hydrochlorothiazide (HYDRODIURIL) 25 MG tablet   . levothyroxine (SYNTHROID,  LEVOTHROID) 100 MCG tablet Take 100 mcg by mouth daily before breakfast.  . losartan (COZAAR) 100 MG tablet   . Melatonin-Pyridoxine (MELATIN PO) Take by mouth.  . Multiple Vitamins-Minerals (PRESERVISION AREDS 2 PO) Take by mouth.  . Omega-3 Fatty Acids (EQL FISH OIL PO) Take by mouth.  . sertraline (ZOLOFT) 50 MG tablet Take 50 mg by mouth daily.   No current facility-administered medications for this visit.  (Other)      REVIEW OF SYSTEMS: ROS    Positive for: Eyes   Negative for: Constitutional, Gastrointestinal, Neurological, Skin, Genitourinary, Musculoskeletal, HENT, Endocrine, Cardiovascular, Respiratory, Psychiatric, Allergic/Imm, Heme/Lymph   Last edited by Zenovia Jordan, LPN on 09/06/1476 29:56 AM. (History)       ALLERGIES No Known Allergies  PAST MEDICAL HISTORY Past Medical History:  Diagnosis Date  . Hypertension   . Osteoarthritis    Past Surgical History:  Procedure Laterality Date  . TUBAL LIGATION    . UTERINE FIBROID SURGERY      FAMILY HISTORY Family History  Problem Relation Age of Onset  . Cancer Mother        lung  . Stroke Father   . Stroke Brother     SOCIAL HISTORY Social History   Tobacco Use  . Smoking status: Never Smoker  . Smokeless tobacco: Never Used  Substance Use Topics  . Alcohol use: No  Frequency: Never  . Drug use: No         OPHTHALMIC EXAM:  Base Eye Exam    Visual Acuity (Snellen - Linear)      Right Left   Dist cc 20/30 +2 20/20   Dist ph cc NI NI   Correction:  Glasses       Tonometry (Tonopen, 10:27 AM)      Right Left   Pressure 14 17       Pachymetry    10/27/2017       Pupils      Dark Light Shape React APD   Right 4 3 Round Brisk None   Left 4 3 Round Brisk None       Visual Fields (Counting fingers)      Left Right    Full Full       Extraocular Movement      Right Left    Full Full       Neuro/Psych    Oriented x3:  Yes   Mood/Affect:  Normal       Dilation    Both  eyes:  1.0% Mydriacyl, Paremyd @ 10:26 AM        Slit Lamp and Fundus Exam    Slit Lamp Exam      Right Left   Lids/Lashes Dermatochalasis - upper lid Dermatochalasis - upper lid   Conjunctiva/Sclera White and quiet White and quiet   Cornea Arcus, early nasal band K Arcus, early nasal band K   Anterior Chamber moderate depth, with narrow angle temporally moderate depth, with narrow angle temporally   Iris Round and well dilated Round and well dilated   Lens 2+ Nuclear sclerosis, 2+ Cortical cataract 2+ Nuclear sclerosis, 2+ Cortical cataract   Vitreous Vitreous syneresis, Posterior vitreous detachment, Weiss ring Vitreous syneresis       Fundus Exam      Right Left   Disc Normal Normal   C/D Ratio 0.3 0.3   Macula Blunted foveal reflex, +SRF -- improved, Drusen, no heme, RPE mottling and clumping Flat, Retinal pigment epithelial mottling,superior para-foveal focal area of RPE atrophy, rare drusen, No heme or edema   Vessels Copper wiring, AV crossing changes, mildly Tortuous Copper wiring, AV crossing changes, mildly Tortuous   Periphery Attached, peripheral drusen / RPE changes nasally Patch of Lattice degeneration at 0600, scattered peripheral drusen, peripheral cystoid degeneration          IMAGING AND PROCEDURES  Imaging and Procedures for 10/28/17  OCT, Retina - OU - Both Eyes     Right Eye Quality was good. Central Foveal Thickness: 295. Progression has improved. Findings include subretinal fluid, no IRF, retinal drusen , pigment epithelial detachment, normal foveal contour, subretinal hyper-reflective material.   Left Eye Quality was good. Central Foveal Thickness: 275. Progression has been stable. Findings include normal foveal contour, no IRF, no SRF, retinal drusen .   Notes *Images captured and stored on drive  Diagnosis / Impression:  OD: central SRF vastly improved OS: NFP, No IRF/SRF  Clinical management:  See below  Abbreviations: NFP - Normal foveal  profile. CME - cystoid macular edema. PED - pigment epithelial detachment. IRF - intraretinal fluid. SRF - subretinal fluid. EZ - ellipsoid zone. ERM - epiretinal membrane. ORA - outer retinal atrophy. ORT - outer retinal tubulation. SRHM - subretinal hyper-reflective material         Fluorescein Angiography [place] (Transit OD)     Right Eye Progression has been stable.  Early phase findings include normal observations. Mid/Late phase findings include normal observations. Choroidal neovascularization is not present.   Left Eye Progression has been stable. Early phase findings include normal observations. Mid/Late phase findings include normal observations. Choroidal neovascularization is not present.   Notes Impression:  No CNVM OD                ASSESSMENT/PLAN:    ICD-10-CM   1. Exudative age-related macular degeneration of right eye with active choroidal neovascularization ([facility]) H35.3211 Fluorescein Angiography [place] (Transit OD)  2. Retinal edema H35.81 OCT, Retina - OU - Both Eyes  3. Essential hypertension I10   4. Hypertensive retinopathy of both eyes H35.033 Fluorescein Angiography [place] (Transit OD)  5. 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 Posterior vitreous detachment of right eye H43.811   7. 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 Exudative age related macular degeneration vs CSCR OD    - OCT with + central SRF -- vastly improved today  - repeat FA without leakage / pooling into area of SRF  - VA remains 20/25, but subjectively improved -- "dark area is lighter"  - discussed findings and prognosis  - review of systems reveals significant stressors in life, but no exogenous steroid use  - discussed possibility of treatment with anti-VEGF should symptoms worsen  - recommend continued monitoring for now -- pt in agreement with plan  - f/u in 6-8 wks -- DFE/OCT  3,4. Hypertensive retinopathy OU - discussed  importance of tight BP control - monitor  5. Lattice degeneration OS- - patch of inferior lattice OS -- no RT, holes or SRF - discussed findings, prognosis, and treatment options including observation - monitor  6. PVD / vitreous syneresis OD-   Discussed findings and prognosis  No RT or RD on 360 peripheral exam  Reviewed s/s of RT/RD  Strict return precautions for any such RT/RD signs/symptoms  7. Combined form age-related cataract OU-  - The symptoms of cataract, surgical options, and treatments and risks were discussed with patient. - discussed diagnosis and progression - not yet visually significant - monitor for now  Ophthalmic Meds Ordered this visit:  No orders of the defined types were placed in this encounter.      Return in about 7 weeks (around 12/15/2017) for F/U Exu AMD OD vs CSCR OD.  There are no Patient Instructions on file for this visit.   Explained the diagnoses, plan, and follow up with the patient and they expressed understanding.  Patient expressed understanding of the importance of proper follow up care.   This document serves as a record of services personally performed by Gardiner Sleeper, MD, PhD. It was created on their behalf by Catha Brow, [place], a certified ophthalmic assistant. The creation of this record is the provider's dictation and/or activities during the visit.  Electronically signed by: Catha Brow, [place]  10/28/17 1:25 PM   Gardiner Sleeper, M.D., Ph.D. Diseases & Surgery of the Retina and [place] 10/28/17   I have reviewed the above documentation for accuracy and completeness, and I agree with the above. Gardiner Sleeper, M.D., Ph.D. 10/28/17 1:25 PM     Abbreviations: M myopia (nearsighted); A astigmatism; H hyperopia (farsighted); P presbyopia; Mrx spectacle prescription;  CTL contact lenses; OD right eye; OS left eye; OU both eyes  XT exotropia; ET esotropia; PEK punctate epithelial  keratitis; PEE punctate epithelial erosions; DES  dry eye syndrome; MGD meibomian gland dysfunction; ATs artificial tears; PFAT's preservative free artificial tears; [place] nuclear sclerotic cataract; PSC posterior subcapsular cataract; ERM epi-retinal membrane; PVD posterior vitreous detachment; RD retinal detachment; DM diabetes mellitus; DR diabetic retinopathy; NPDR non-proliferative diabetic retinopathy; PDR proliferative diabetic retinopathy; CSME clinically significant macular edema; DME diabetic macular edema; dbh dot blot hemorrhages; CWS cotton wool spot; POAG primary open angle glaucoma; C/D cup-to-disc ratio; HVF humphrey visual field; GVF goldmann visual field; OCT optical coherence tomography; IOP intraocular pressure; BRVO Branch retinal vein occlusion; CRVO central retinal vein occlusion; CRAO central retinal artery occlusion; BRAO branch retinal artery occlusion; RT retinal tear; SB scleral buckle; PPV pars plana vitrectomy; VH Vitreous hemorrhage; PRP panretinal laser photocoagulation; IVK intravitreal kenalog; VMT vitreomacular traction; MH Macular hole;  NVD neovascularization of the disc; NVE neovascularization elsewhere; AREDS age related eye disease study; ARMD age related macular degeneration; POAG primary open angle glaucoma; EBMD epithelial/anterior basement membrane dystrophy; ACIOL anterior chamber intraocular lens; IOL intraocular lens; PCIOL posterior chamber intraocular lens; Phaco/IOL phacoemulsification with intraocular lens placement; [place] photorefractive keratectomy; LASIK laser assisted in situ keratomileusis; HTN hypertension; DM diabetes mellitus; COPD chronic obstructive pulmonary disease

## 2017-10-27 ENCOUNTER — Encounter (INDEPENDENT_AMBULATORY_CARE_PROVIDER_SITE_OTHER): Payer: Self-pay | Admitting: Ophthalmology

## 2017-10-27 ENCOUNTER — Ambulatory Visit (INDEPENDENT_AMBULATORY_CARE_PROVIDER_SITE_OTHER): Payer: Medicare HMO | Admitting: Ophthalmology

## 2017-10-27 DIAGNOSIS — H3581 Retinal edema: Secondary | ICD-10-CM

## 2017-10-27 DIAGNOSIS — H353211 Exudative age-related macular degeneration, right eye, with active choroidal neovascularization: Secondary | ICD-10-CM

## 2017-10-27 DIAGNOSIS — H35412 Lattice degeneration of retina, left eye: Secondary | ICD-10-CM

## 2017-10-27 DIAGNOSIS — H35033 Hypertensive retinopathy, bilateral: Secondary | ICD-10-CM

## 2017-10-27 DIAGNOSIS — I10 Essential (primary) hypertension: Secondary | ICD-10-CM

## 2017-10-27 DIAGNOSIS — H43811 Vitreous degeneration, right eye: Secondary | ICD-10-CM

## 2017-10-27 DIAGNOSIS — H25813 Combined forms of age-related cataract, bilateral: Secondary | ICD-10-CM

## 2017-10-28 ENCOUNTER — Encounter (INDEPENDENT_AMBULATORY_CARE_PROVIDER_SITE_OTHER): Payer: Self-pay | Admitting: Ophthalmology

## 2017-11-01 DIAGNOSIS — Z299 Encounter for prophylactic measures, unspecified: Secondary | ICD-10-CM | POA: Diagnosis not present

## 2017-11-01 DIAGNOSIS — E7800 Pure hypercholesterolemia, unspecified: Secondary | ICD-10-CM | POA: Diagnosis not present

## 2017-11-01 DIAGNOSIS — R350 Frequency of micturition: Secondary | ICD-10-CM | POA: Diagnosis not present

## 2017-11-01 DIAGNOSIS — Z6832 Body mass index (BMI) 32.0-32.9, adult: Secondary | ICD-10-CM | POA: Diagnosis not present

## 2017-11-01 DIAGNOSIS — I10 Essential (primary) hypertension: Secondary | ICD-10-CM | POA: Diagnosis not present

## 2017-11-01 DIAGNOSIS — R300 Dysuria: Secondary | ICD-10-CM | POA: Diagnosis not present

## 2017-11-01 DIAGNOSIS — E039 Hypothyroidism, unspecified: Secondary | ICD-10-CM | POA: Diagnosis not present

## 2017-11-30 DIAGNOSIS — I10 Essential (primary) hypertension: Secondary | ICD-10-CM | POA: Diagnosis not present

## 2017-11-30 DIAGNOSIS — E039 Hypothyroidism, unspecified: Secondary | ICD-10-CM | POA: Diagnosis not present

## 2017-11-30 DIAGNOSIS — Z299 Encounter for prophylactic measures, unspecified: Secondary | ICD-10-CM | POA: Diagnosis not present

## 2017-11-30 DIAGNOSIS — R69 Illness, unspecified: Secondary | ICD-10-CM | POA: Diagnosis not present

## 2017-11-30 DIAGNOSIS — E7800 Pure hypercholesterolemia, unspecified: Secondary | ICD-10-CM | POA: Diagnosis not present

## 2017-12-07 NOTE — Progress Notes (Signed)
[facility] Note  12/08/2017     CHIEF COMPLAINT Patient presents for Retina Follow Up   HISTORY OF PRESENT ILLNESS: Darlene Crawford is a 77 y.o. female who presents to the clinic today for:   HPI    Retina Follow Up    Patient presents with  Other.  In right eye.  Severity is moderate.  Duration of 7 weeks.  Since onset it is stable.  I, the attending physician,  performed the HPI with the patient and updated documentation appropriately.          Comments    Pt presents today for F/U for Exu AMD OD vs CSCR OD, pt states VA is the same since last visit, pt denies flashes, floaters, pain or wavy vision, pt uses OTC AT's PRN and Preservision vits,        Last edited by Bernarda Caffey, MD on 12/08/2017 11:05 AM. (History)    Pt states she feels OD VA is stable;   Referring physician: Monico Blitz, MD [place], [place] [address]  HISTORICAL INFORMATION:   Selected notes from the [facility] Referred by Dr. Leander Rams for concern of ARMD OD with progression to CNVM vs CSR;  LEE- 01.29.19 ([facility]. [facility]) [BCVA OD: 20/100 OS: 20/30] Ocular Hx- AMD OU, cataract OU, DES OU, HTN retinopathy OU, WWP OU, choroidal nevus OD;  PMH- elevated chol, HTN, hyperthyoridism    CURRENT MEDICATIONS: Current Outpatient Medications (Ophthalmic Drugs)  Medication Sig  . Artificial Tear Ointment (DRY EYES OP) Apply to eye.   No current facility-administered medications for this visit.  (Ophthalmic Drugs)   Current Outpatient Medications (Other)  Medication Sig  . atorvastatin (LIPITOR) 10 MG tablet   . b complex vitamins capsule Take 1 capsule by mouth daily.  . calcium citrate-vitamin D (CITRACAL+D) 315-200 MG-UNIT tablet Take 1 tablet by mouth 2 (two) times daily.  . diclofenac (VOLTAREN) 50 MG EC tablet   . hydrochlorothiazide (HYDRODIURIL) 25 MG tablet   . levothyroxine (SYNTHROID, LEVOTHROID) 100 MCG tablet Take 100 mcg by mouth daily before breakfast.   . losartan (COZAAR) 100 MG tablet   . Melatonin-Pyridoxine (MELATIN PO) Take by mouth.  . Multiple Vitamins-Minerals (PRESERVISION AREDS 2 PO) Take by mouth.  . Omega-3 Fatty Acids (EQL FISH OIL PO) Take by mouth.  . sertraline (ZOLOFT) 50 MG tablet Take 50 mg by mouth daily.   No current facility-administered medications for this visit.  (Other)      REVIEW OF SYSTEMS: ROS    Positive for: Musculoskeletal, Cardiovascular, Eyes   Negative for: Constitutional, Gastrointestinal, Neurological, Skin, Genitourinary, HENT, Endocrine, Respiratory, Psychiatric, Allergic/Imm, Heme/Lymph   Last edited by Debbrah Alar, COT on 12/08/2017 10:10 AM. (History)       ALLERGIES No Known Allergies  PAST MEDICAL HISTORY Past Medical History:  Diagnosis Date  . Hypertension   . Osteoarthritis    Past Surgical History:  Procedure Laterality Date  . TUBAL LIGATION    . UTERINE FIBROID SURGERY      FAMILY HISTORY Family History  Problem Relation Age of Onset  . Cancer Mother        lung  . Stroke Father   . Stroke Brother     SOCIAL HISTORY Social History   Tobacco Use  . Smoking status: Never Smoker  . Smokeless tobacco: Never Used  Substance Use Topics  . Alcohol use: No    Frequency: Never  . Drug use:  No         OPHTHALMIC EXAM:  Base Eye Exam    Visual Acuity (Snellen - Linear)      Right Left   Dist cc 20/20 -1 20/20 -1   Dist ph cc NI 20/20   Correction:  Glasses       Tonometry (Tonopen, 10:15 AM)      Right Left   Pressure 11 12       Pupils      Dark Light Shape React APD   Right 4 3 Round Brisk None   Left 4 3 Round Brisk None       Visual Fields (Counting fingers)      Left Right    Full Full       Extraocular Movement      Right Left    Full, Ortho Full, Ortho       Neuro/Psych    Oriented x3:  Yes   Mood/Affect:  Normal       Dilation    Both eyes:  1.0% Mydriacyl, 2.5% Phenylephrine @ 10:15 AM        Slit Lamp and Fundus  Exam    Slit Lamp Exam      Right Left   Lids/Lashes Dermatochalasis - upper lid Dermatochalasis - upper lid   Conjunctiva/Sclera White and quiet White and quiet   Cornea Arcus, early nasal band K Arcus, early nasal band K   Anterior Chamber moderate depth, with narrow angle temporally moderate depth, with narrow angle temporally   Iris Round and well dilated Round and well dilated   Lens 2+ Nuclear sclerosis, 2+ Cortical cataract 2+ Nuclear sclerosis, 2+ Cortical cataract   Vitreous Vitreous syneresis, Posterior vitreous detachment, Weiss ring Vitreous syneresis       Fundus Exam      Right Left   Disc Normal Normal   C/D Ratio 0.3 0.4   Macula Blunted foveal reflex, Drusen, no heme, RPE mottling and clumping, small area of edema/SRF inferonasal to fovea Flat, Retinal pigment epithelial mottling,superior para-foveal focal area of RPE atrophy, rare drusen, No heme or edema   Vessels Copper wiring, AV crossing changes, Tortuous Copper wiring, AV crossing changes, mildly Tortuous   Periphery Attached, peripheral drusen / RPE changes nasally Patch of Lattice degeneration at 0600, scattered peripheral drusen, peripheral cystoid degeneration          IMAGING AND PROCEDURES  Imaging and Procedures for 12/11/17  OCT, Retina - OU - Both Eyes       Right Eye Quality was good. Central Foveal Thickness: 276. Progression has worsened. Findings include subretinal fluid, no IRF, retinal drusen , normal foveal contour, pigment epithelial detachment (new pocket of SRF inferionasal to fovea).   Left Eye Quality was good. Central Foveal Thickness: 275. Progression has been stable. Findings include normal foveal contour, no IRF, no SRF, retinal drusen .   Notes *Images captured and stored on drive  Diagnosis / Impression:  OD: +SRF with new pocket inferonasal to fovea OS: NFP, No IRF/SRF  Clinical management:  See below  Abbreviations: NFP - Normal foveal profile. CME - cystoid macular  edema. PED - pigment epithelial detachment. IRF - intraretinal fluid. SRF - subretinal fluid. EZ - ellipsoid zone. ERM - epiretinal membrane. ORA - outer retinal atrophy. ORT - outer retinal tubulation. SRHM - subretinal hyper-reflective material                  ASSESSMENT/PLAN:    ICD-10-CM  1. Exudative age-related macular degeneration of right eye with active choroidal neovascularization ([facility]) H35.3211 OCT, Retina - OU - Both Eyes  2. Retinal edema H35.81   3. Essential hypertension I10   4. Hypertensive retinopathy of both eyes H35.033   5. 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 Posterior vitreous detachment of right eye H43.811   7. 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 Exudative age related macular degeneration vs CSCR OD    - OCT with central SRF improved today, but new area of SRF has reaccumulated  - repeat FA last visit without leakage / pooling into area of SRF  - VA remains 20/25, but subjectively improved -- "dark area is lighter"  - discussed findings and prognosis  - review of systems reveals significant stressors in life, but no exogenous steroid use  - discussed possibility of treatment with anti-VEGF should symptoms worsen  - OCT-A obtained today 4.12.19  - recommend continued monitoring for now -- pt in agreement with plan  - f/u in 6-8 wks -- DFE/OCT - possible repeat OCT-A  3,4. Hypertensive retinopathy OU - discussed importance of tight BP control - stable - monitor  5. Lattice degeneration OS- - patch of inferior lattice OS -- no RT, holes or SRF - discussed findings, prognosis, and treatment options including observation - monitor  6. PVD / vitreous syneresis OD-   Discussed findings and prognosis  No RT or RD on 360 peripheral exam  Reviewed s/s of RT/RD  Strict return precautions for any such RT/RD signs/symptoms  monitor  7. Combined form age-related cataract OU-  - The symptoms of cataract,  surgical options, and treatments and risks were discussed with patient. - discussed diagnosis and progression - not yet visually significant - monitor for now  Ophthalmic Meds Ordered this visit:  No orders of the defined types were placed in this encounter.      Return in about 7 weeks (around 01/26/2018) for F/U Exu AMD vs CSCR OD, Dilated exam, OCT.  There are no Patient Instructions on file for this visit.   Explained the diagnoses, plan, and follow up with the patient and they expressed understanding.  Patient expressed understanding of the importance of proper follow up care.   This document serves as a record of services personally performed by Gardiner Sleeper, MD, PhD. It was created on their behalf by Catha Brow, [place], a certified ophthalmic assistant. The creation of this record is the provider's dictation and/or activities during the visit.  Electronically signed by: Catha Brow, [place]  12/11/17 1:10 PM   Gardiner Sleeper, M.D., Ph.D. Diseases & Surgery of the Retina and [place] 12/11/17   I have reviewed the above documentation for accuracy and completeness, and I agree with the above. Gardiner Sleeper, M.D., Ph.D. 12/11/17 1:13 PM    Abbreviations: M myopia (nearsighted); A astigmatism; H hyperopia (farsighted); P presbyopia; Mrx spectacle prescription;  CTL contact lenses; OD right eye; OS left eye; OU both eyes  XT exotropia; ET esotropia; PEK punctate epithelial keratitis; PEE punctate epithelial erosions; DES dry eye syndrome; MGD meibomian gland dysfunction; ATs artificial tears; PFAT's preservative free artificial tears; [place] nuclear sclerotic cataract; PSC posterior subcapsular cataract; ERM epi-retinal membrane; PVD posterior vitreous detachment; RD retinal detachment; DM diabetes mellitus; DR diabetic retinopathy; NPDR non-proliferative diabetic retinopathy; PDR proliferative diabetic retinopathy; CSME clinically  significant macular edema; DME diabetic macular edema; dbh dot blot  hemorrhages; CWS cotton wool spot; POAG primary open angle glaucoma; C/D cup-to-disc ratio; HVF humphrey visual field; GVF goldmann visual field; OCT optical coherence tomography; IOP intraocular pressure; BRVO Branch retinal vein occlusion; CRVO central retinal vein occlusion; CRAO central retinal artery occlusion; BRAO branch retinal artery occlusion; RT retinal tear; SB scleral buckle; PPV pars plana vitrectomy; VH Vitreous hemorrhage; PRP panretinal laser photocoagulation; IVK intravitreal kenalog; VMT vitreomacular traction; MH Macular hole;  NVD neovascularization of the disc; NVE neovascularization elsewhere; AREDS age related eye disease study; ARMD age related macular degeneration; POAG primary open angle glaucoma; EBMD epithelial/anterior basement membrane dystrophy; ACIOL anterior chamber intraocular lens; IOL intraocular lens; PCIOL posterior chamber intraocular lens; Phaco/IOL phacoemulsification with intraocular lens placement; [place] photorefractive keratectomy; LASIK laser assisted in situ keratomileusis; HTN hypertension; DM diabetes mellitus; COPD chronic obstructive pulmonary disease

## 2017-12-08 ENCOUNTER — Ambulatory Visit (INDEPENDENT_AMBULATORY_CARE_PROVIDER_SITE_OTHER): Payer: Medicare HMO | Admitting: Ophthalmology

## 2017-12-08 ENCOUNTER — Encounter (INDEPENDENT_AMBULATORY_CARE_PROVIDER_SITE_OTHER): Payer: Self-pay | Admitting: Ophthalmology

## 2017-12-08 DIAGNOSIS — H25813 Combined forms of age-related cataract, bilateral: Secondary | ICD-10-CM

## 2017-12-08 DIAGNOSIS — H35033 Hypertensive retinopathy, bilateral: Secondary | ICD-10-CM

## 2017-12-08 DIAGNOSIS — H353211 Exudative age-related macular degeneration, right eye, with active choroidal neovascularization: Secondary | ICD-10-CM

## 2017-12-08 DIAGNOSIS — H43811 Vitreous degeneration, right eye: Secondary | ICD-10-CM

## 2017-12-08 DIAGNOSIS — I10 Essential (primary) hypertension: Secondary | ICD-10-CM

## 2017-12-08 DIAGNOSIS — H35412 Lattice degeneration of retina, left eye: Secondary | ICD-10-CM | POA: Diagnosis not present

## 2017-12-08 DIAGNOSIS — H3581 Retinal edema: Secondary | ICD-10-CM

## 2017-12-11 ENCOUNTER — Encounter (INDEPENDENT_AMBULATORY_CARE_PROVIDER_SITE_OTHER): Payer: Self-pay | Admitting: Ophthalmology

## 2018-02-01 NOTE — Progress Notes (Signed)
[facility] Note  02/02/2018     CHIEF COMPLAINT Patient presents for Retina Follow Up   HISTORY OF PRESENT ILLNESS: Darlene Crawford is a 77 y.o. female who presents to the clinic today for:   HPI    Retina Follow Up    Patient presents with  Wet AMD.  This started 4 months ago.  Severity is moderate.  Since onset it is gradually worsening.  I, the attending physician,  performed the HPI with the patient and updated documentation appropriately.          Comments    F/U EXU AMD/CSCR OD. Patient states she feels her vision is getting worse , the dark spot in her OD has became larger, small print is difficult to see to read. Denies floaters, flashes and ocular pain. Pt is taking Ocuvite QD, using AT gtt's Prn       Last edited by Bernarda Caffey, MD on 02/02/2018  1:24 PM. (History)    Pt states she feels OD VA is stable;   Referring physician: Monico Blitz, MD [place], [place] [id number]  HISTORICAL INFORMATION:   Selected notes from the MEDICAL RECORD NUMBER Referred by Dr. Leander Rams for concern of ARMD OD with progression to CNVM vs CSR;  LEE- 01.29.19 (R. [facility]) [BCVA OD: 20/100 OS: 20/30] Ocular Hx- AMD OU, cataract OU, DES OU, HTN retinopathy OU, WWP OU, choroidal nevus OD;  PMH- elevated chol, HTN, hyperthyoridism    CURRENT MEDICATIONS: Current Outpatient Medications (Ophthalmic Drugs)  Medication Sig  . Artificial Tear Ointment (DRY EYES OP) Apply to eye.   No current facility-administered medications for this visit.  (Ophthalmic Drugs)   Current Outpatient Medications (Other)  Medication Sig  . atorvastatin (LIPITOR) 10 MG tablet   . b complex vitamins capsule Take 1 capsule by mouth daily.  . calcium citrate-vitamin D (CITRACAL+D) 315-200 MG-UNIT tablet Take 1 tablet by mouth 2 (two) times daily.  . diclofenac (VOLTAREN) 50 MG EC tablet   . hydrochlorothiazide (HYDRODIURIL) 25 MG tablet   . levothyroxine (SYNTHROID, LEVOTHROID)  100 MCG tablet Take 100 mcg by mouth daily before breakfast.  . losartan (COZAAR) 100 MG tablet   . Melatonin-Pyridoxine (MELATIN PO) Take by mouth.  . Multiple Vitamins-Minerals (PRESERVISION AREDS 2 PO) Take by mouth.  . Omega-3 Fatty Acids (EQL FISH OIL PO) Take by mouth.  . sertraline (ZOLOFT) 50 MG tablet Take 50 mg by mouth daily.  Marland Kitchen eplerenone (INSPRA) 25 MG tablet Take 1 tablet (25 mg total) by mouth daily.   No current facility-administered medications for this visit.  (Other)      REVIEW OF SYSTEMS: ROS    Positive for: Musculoskeletal, Cardiovascular, Eyes   Negative for: Constitutional, Gastrointestinal, Neurological, Skin, Genitourinary, HENT, Endocrine, Respiratory, Psychiatric, Allergic/Imm, Heme/Lymph   Last edited by Cherrie Gauze, COA on 02/02/2018 11:50 AM. (History)       ALLERGIES No Known Allergies  PAST MEDICAL HISTORY Past Medical History:  Diagnosis Date  . Hypertension   . Osteoarthritis    Past Surgical History:  Procedure Laterality Date  . TUBAL LIGATION    . UTERINE FIBROID SURGERY      FAMILY HISTORY Family History  Problem Relation Age of Onset  . Cancer Mother        lung  . Stroke Father   . Stroke Brother     SOCIAL HISTORY Social History   Tobacco Use  . Smoking status: Never Smoker  .  Smokeless tobacco: Never Used  Substance Use Topics  . Alcohol use: No    Frequency: Never  . Drug use: No         OPHTHALMIC EXAM:  Base Eye Exam    Visual Acuity (Snellen - Linear)      Right Left   Dist cc 20/50 20/30 +2   Dist ph cc 20/30 -2 20/25       Tonometry (Tonopen, 10:28 AM)      Right Left   Pressure 14 13       Pupils      Dark Light Shape React APD   Right 4 3 Round Slow None   Left 4 3 Round Slow None       Visual Fields (Counting fingers)      Left Right    Full Full       Extraocular Movement      Right Left    Full, Ortho Full, Ortho       Neuro/Psych    Oriented x3:  Yes   Mood/Affect:   Normal       Dilation    Both eyes:  1.0% Mydriacyl, 2.5% Phenylephrine @ 10:28 AM        Slit Lamp and Fundus Exam    Slit Lamp Exam      Right Left   Lids/Lashes Dermatochalasis - upper lid Dermatochalasis - upper lid   Conjunctiva/Sclera White and quiet White and quiet   Cornea Arcus, early nasal band K Arcus, early nasal band K   Anterior Chamber moderate depth, with narrow angle temporally moderate depth, with narrow angle temporally   Iris Round and well dilated Round and well dilated   Lens 2+ Nuclear sclerosis, 2+ Cortical cataract 2+ Nuclear sclerosis, 2+ Cortical cataract   Vitreous Vitreous syneresis, Posterior vitreous detachment, Weiss ring Vitreous syneresis       Fundus Exam      Right Left   Disc Normal Normal   C/D Ratio 0.3 0.3   Macula Blunted foveal reflex, Drusen, no heme, RPE mottling and clumping, central SRF  Flat, Retinal pigment epithelial mottling,superior para-foveal focal area of RPE atrophy, rare drusen, No heme or edema   Vessels Copper wiring, AV crossing changes, Tortuous Copper wiring, AV crossing changes, mildly Tortuous   Periphery Attached, peripheral drusen / RPE changes nasally, peripheral cystoid degeneration Patch of Lattice degeneration at 0600, scattered peripheral drusen, peripheral cystoid degeneration        Refraction    Wearing Rx      Sphere Cylinder Axis Add   Right +2.00 +1.25 158 +2.50   Left +2.00 +0.75 002 +2.50   Age:  98m   Type:  PAL       Manifest Refraction      Sphere Cylinder Axis Dist VA   Right +2.75 +0.50 165 20/40+2   Left +2.50 +1.00 010 20/20          IMAGING AND PROCEDURES  Imaging and Procedures for 12/11/17  OCT, Retina - OU - Both Eyes       Right Eye Quality was good. Central Foveal Thickness: 509. Progression has worsened. Findings include subretinal fluid, no IRF, retinal drusen , pigment epithelial detachment, abnormal foveal contour.   Left Eye Quality was good. Central Foveal  Thickness: 274. Progression has been stable. Findings include normal foveal contour, no IRF, no SRF, retinal drusen .   Notes *Images captured and stored on drive  Diagnosis / Impression:  OD: interval development  of sub-foveal SRF OS: NFP, No IRF/SRF  Clinical management:  See below  Abbreviations: NFP - Normal foveal profile. CME - cystoid macular edema. PED - pigment epithelial detachment. IRF - intraretinal fluid. SRF - subretinal fluid. EZ - ellipsoid zone. ERM - epiretinal membrane. ORA - outer retinal atrophy. ORT - outer retinal tubulation. SRHM - subretinal hyper-reflective material                  ASSESSMENT/PLAN:    ICD-10-CM   1. Exudative age-related macular degeneration of right eye with active choroidal neovascularization ([place]) H35.3211   2. 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 Retinal edema H35.81 OCT, Retina - OU - Both Eyes  4. Essential hypertension I10   5. Hypertensive retinopathy of both eyes H35.033   6. 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 Posterior vitreous detachment of right eye H43.811   8. 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 Exudative age related macular degeneration vs CSCR OD    - repeat FA last visit without leakage / pooling into area of SRF  - VA decreased 20/30-2 from 20/25, subjectively worse -- "dark area is larger"  - OCT with central SRF reaccumulated again today  - repeat OCT-A obtained today (06.07.19)  - discussed findings and prognosis  - review of systems reveals significant stressors in life, but no exogenous steroid use  - discussed treatment with anti-VEGF vs 25mg  of Eplerenone  - pt wishes to be treated with Eplerenone 25mg  po daily  - f/u in 4-6 wks -- DFE/OCT - repeat OCT-A  4,5. Hypertensive retinopathy OU - discussed importance of tight BP control - stable - monitor  6. Lattice degeneration OS- - patch of inferior lattice OS -- no RT, holes or  SRF - discussed findings, prognosis, and treatment options including observation - monitor  7. PVD / vitreous syneresis OD-   Discussed findings and prognosis  No RT or RD on 360 peripheral exam  Reviewed s/s of RT/RD  Strict return precautions for any such RT/RD signs/symptoms  monitor  8. Combined form age-related cataract OU-  - The symptoms of cataract, surgical options, and treatments and risks were discussed with patient. - discussed diagnosis and progression - not yet visually significant - monitor for now  Ophthalmic Meds Ordered this visit:  Meds ordered this encounter  Medications  . eplerenone (INSPRA) 25 MG tablet    Sig: Take 1 tablet (25 mg total) by mouth daily.    Dispense:  30 tablet    Refill:  3       Return in about 1 month (around 03/02/2018) for F/U Exu AMD vs CSCR OD, DFE, OCT-A.  There are no Patient Instructions on file for this visit.   Explained the diagnoses, plan, and follow up with the patient and they expressed understanding.  Patient expressed understanding of the importance of proper follow up care.   This document serves as a record of services personally performed by Gardiner Sleeper, MD, PhD. It was created on their behalf by Catha Brow, [place], a certified ophthalmic assistant. The creation of this record is the provider's dictation and/or activities during the visit.  Electronically signed by: Catha Brow, [place]  06.06.19 4:37 PM   Gardiner Sleeper, M.D., Ph.D. Diseases & Surgery of the Retina and Vitreous [facility]  I have reviewed the above documentation for accuracy and completeness, and I agree with  the above. Gardiner Sleeper, M.D., Ph.D. 02/03/18 4:42 PM    Abbreviations: M myopia (nearsighted); A astigmatism; H hyperopia (farsighted); P presbyopia; Mrx spectacle prescription;  CTL contact lenses; OD right eye; OS left eye; OU both eyes  XT exotropia; ET esotropia; PEK punctate epithelial keratitis;  PEE punctate epithelial erosions; DES dry eye syndrome; MGD meibomian gland dysfunction; ATs artificial tears; PFAT's preservative free artificial tears; [place] nuclear sclerotic cataract; PSC posterior subcapsular cataract; ERM epi-retinal membrane; PVD posterior vitreous detachment; RD retinal detachment; DM diabetes mellitus; DR diabetic retinopathy; NPDR non-proliferative diabetic retinopathy; PDR proliferative diabetic retinopathy; CSME clinically significant macular edema; DME diabetic macular edema; dbh dot blot hemorrhages; CWS cotton wool spot; POAG primary open angle glaucoma; C/D cup-to-disc ratio; HVF humphrey visual field; GVF goldmann visual field; OCT optical coherence tomography; IOP intraocular pressure; BRVO Branch retinal vein occlusion; CRVO central retinal vein occlusion; CRAO central retinal artery occlusion; BRAO branch retinal artery occlusion; RT retinal tear; SB scleral buckle; PPV pars plana vitrectomy; VH Vitreous hemorrhage; PRP panretinal laser photocoagulation; IVK intravitreal kenalog; VMT vitreomacular traction; MH Macular hole;  NVD neovascularization of the disc; NVE neovascularization elsewhere; AREDS age related eye disease study; ARMD age related macular degeneration; POAG primary open angle glaucoma; EBMD epithelial/anterior basement membrane dystrophy; ACIOL anterior chamber intraocular lens; IOL intraocular lens; PCIOL posterior chamber intraocular lens; Phaco/IOL phacoemulsification with intraocular lens placement; [place] photorefractive keratectomy; LASIK laser assisted in situ keratomileusis; HTN hypertension; DM diabetes mellitus; COPD chronic obstructive pulmonary disease

## 2018-02-02 ENCOUNTER — Ambulatory Visit (INDEPENDENT_AMBULATORY_CARE_PROVIDER_SITE_OTHER): Payer: Medicare HMO | Admitting: Ophthalmology

## 2018-02-02 ENCOUNTER — Encounter (INDEPENDENT_AMBULATORY_CARE_PROVIDER_SITE_OTHER): Payer: Self-pay | Admitting: Ophthalmology

## 2018-02-02 DIAGNOSIS — H35033 Hypertensive retinopathy, bilateral: Secondary | ICD-10-CM | POA: Diagnosis not present

## 2018-02-02 DIAGNOSIS — H3581 Retinal edema: Secondary | ICD-10-CM | POA: Diagnosis not present

## 2018-02-02 DIAGNOSIS — H43811 Vitreous degeneration, right eye: Secondary | ICD-10-CM

## 2018-02-02 DIAGNOSIS — H353211 Exudative age-related macular degeneration, right eye, with active choroidal neovascularization: Secondary | ICD-10-CM

## 2018-02-02 DIAGNOSIS — I10 Essential (primary) hypertension: Secondary | ICD-10-CM

## 2018-02-02 DIAGNOSIS — H25813 Combined forms of age-related cataract, bilateral: Secondary | ICD-10-CM | POA: Diagnosis not present

## 2018-02-02 DIAGNOSIS — H35711 Central serous chorioretinopathy, right eye: Secondary | ICD-10-CM | POA: Diagnosis not present

## 2018-02-02 DIAGNOSIS — H35412 Lattice degeneration of retina, left eye: Secondary | ICD-10-CM | POA: Diagnosis not present

## 2018-02-02 MED ORDER — EPLERENONE 25 MG PO TABS: 25.0000 mg | ORAL_TABLET | Freq: Every day | ORAL | 3 refills | Status: DC

## 2018-02-03 ENCOUNTER — Encounter (INDEPENDENT_AMBULATORY_CARE_PROVIDER_SITE_OTHER): Payer: Self-pay | Admitting: Ophthalmology

## 2018-02-07 NOTE — Progress Notes (Signed)
[facility] Note  02/08/2018     CHIEF COMPLAINT Patient presents for Retina Follow Up   HISTORY OF PRESENT ILLNESS: Darlene Crawford is a 77 y.o. female who presents to the clinic today for:   HPI    Retina Follow Up    Patient presents with  Wet AMD.  In right eye.  Severity is mild.  Since onset it is stable.  I, the attending physician,  performed the HPI with the patient and updated documentation appropriately.          Comments    F/U EXU AMD/CSCR OD. Patient states her vision has became distorted. Pt is ready to have tx if indicated.         Last edited by Bernarda Caffey, MD on 02/08/2018  1:36 PM. (History)      Referring physician: Monico Blitz, MD [place], [place] [id number]  HISTORICAL INFORMATION:   Selected notes from the MEDICAL RECORD NUMBER Referred by Dr. Leander Rams for concern of ARMD OD with progression to CNVM vs CSR;  LEE- 01.29.19 ([facility]. [facility]) [BCVA OD: 20/100 OS: 20/30] Ocular Hx- AMD OU, cataract OU, DES OU, HTN retinopathy OU, WWP OU, choroidal nevus OD;  PMH- elevated chol, HTN, hyperthyoridism    CURRENT MEDICATIONS: Current Outpatient Medications (Ophthalmic Drugs)  Medication Sig  . Artificial Tear Ointment (DRY EYES OP) Apply to eye.   No current facility-administered medications for this visit.  (Ophthalmic Drugs)   Current Outpatient Medications (Other)  Medication Sig  . atorvastatin (LIPITOR) 10 MG tablet   . b complex vitamins capsule Take 1 capsule by mouth daily.  . calcium citrate-vitamin D (CITRACAL+D) 315-200 MG-UNIT tablet Take 1 tablet by mouth 2 (two) times daily.  . diclofenac (VOLTAREN) 50 MG EC tablet   . eplerenone (INSPRA) 25 MG tablet Take 1 tablet (25 mg total) by mouth daily.  . hydrochlorothiazide (HYDRODIURIL) 25 MG tablet   . levothyroxine (SYNTHROID, LEVOTHROID) 100 MCG tablet Take 100 mcg by mouth daily before breakfast.  . losartan (COZAAR) 100 MG tablet   . Melatonin-Pyridoxine  (MELATIN PO) Take by mouth.  . Multiple Vitamins-Minerals (PRESERVISION AREDS 2 PO) Take by mouth.  . Omega-3 Fatty Acids (EQL FISH OIL PO) Take by mouth.  . sertraline (ZOLOFT) 50 MG tablet Take 50 mg by mouth daily.   No current facility-administered medications for this visit.  (Other)      REVIEW OF SYSTEMS: ROS    Positive for: Eyes   Negative for: Constitutional, Gastrointestinal, Neurological, Skin, Genitourinary, Musculoskeletal, HENT, Endocrine, Cardiovascular, Respiratory, Psychiatric, Allergic/Imm, Heme/Lymph   Last edited by Zenovia Jordan, LPN on 0/16/0109 32:35 PM. (History)       ALLERGIES No Known Allergies  PAST MEDICAL HISTORY Past Medical History:  Diagnosis Date  . Hypertension   . Osteoarthritis    Past Surgical History:  Procedure Laterality Date  . TUBAL LIGATION    . UTERINE FIBROID SURGERY      FAMILY HISTORY Family History  Problem Relation Age of Onset  . Cancer Mother        lung  . Stroke Father   . Stroke Brother     SOCIAL HISTORY Social History   Tobacco Use  . Smoking status: Never Smoker  . Smokeless tobacco: Never Used  Substance Use Topics  . Alcohol use: No    Frequency: Never  . Drug use: No         OPHTHALMIC EXAM:  Base  Eye Exam    Visual Acuity (Snellen - Linear)      Right Left   Dist cc 20/40 -1 20/20 -2   Dist ph cc 20/30 -2 NI   Correction:  Glasses       Tonometry (Tonopen, 1:04 PM)      Right Left   Pressure 11 13       Pupils      Dark Light Shape React APD   Right 4 2 Round Brisk None   Left 4 2 Round Brisk None       Visual Fields (Counting fingers)      Left Right    Full Full       Extraocular Movement      Right Left    Full, Ortho Full, Ortho       Neuro/Psych    Oriented x3:  Yes   Mood/Affect:  Normal       Dilation    Right eye:  1.0% Mydriacyl, 2.5% Phenylephrine @ 1:04 PM        Slit Lamp and Fundus Exam    Slit Lamp Exam      Right Left   Lids/Lashes  Dermatochalasis - upper lid Dermatochalasis - upper lid   Conjunctiva/Sclera White and quiet White and quiet   Cornea Arcus, early nasal band K Arcus, early nasal band K   Anterior Chamber moderate depth, with narrow angle temporally moderate depth, with narrow angle temporally   Iris Round and well dilated Round and well dilated   Lens 2+ Nuclear sclerosis, 2+ Cortical cataract 2+ Nuclear sclerosis, 2+ Cortical cataract   Vitreous Vitreous syneresis, Posterior vitreous detachment, Weiss ring Vitreous syneresis       Fundus Exam      Right Left   Disc Normal Normal   C/D Ratio 0.3 0.3   Macula Blunted foveal reflex, Drusen, no heme, RPE mottling and clumping, central SRF  Flat, Retinal pigment epithelial mottling,superior para-foveal focal area of RPE atrophy, rare drusen, No heme or edema   Vessels Copper wiring, AV crossing changes, Tortuous Copper wiring, AV crossing changes, mildly Tortuous   Periphery Attached, peripheral drusen / RPE changes nasally, peripheral cystoid degeneration Patch of Lattice degeneration at 0600, scattered peripheral drusen, peripheral cystoid degeneration          IMAGING AND PROCEDURES  Imaging and Procedures for 12/11/17  OCT, Retina - OU - Both Eyes       Right Eye Quality was good. Central Foveal Thickness: 481. Progression has improved. Findings include subretinal fluid, no IRF, retinal drusen , pigment epithelial detachment, abnormal foveal contour.   Left Eye Quality was good. Central Foveal Thickness: 278. Progression has been stable. Findings include normal foveal contour, no IRF, no SRF, retinal drusen .   Notes *Images captured and stored on drive  Diagnosis / Impression:  OD: interval improvement but persistent sub-foveal SRF OS: NFP, No IRF/SRF  Clinical management:  See below  Abbreviations: NFP - Normal foveal profile. CME - cystoid macular edema. PED - pigment epithelial detachment. IRF - intraretinal fluid. SRF - subretinal  fluid. EZ - ellipsoid zone. ERM - epiretinal membrane. ORA - outer retinal atrophy. ORT - outer retinal tubulation. SRHM - subretinal hyper-reflective material                  ASSESSMENT/PLAN:    ICD-10-CM   1. Exudative age-related macular degeneration of right eye with active choroidal neovascularization ([place]) H35.3211   2. Central serous chorioretinopathy of  right eye H35.711   3. Retinal edema H35.81 OCT, Retina - OU - Both Eyes  4. Essential hypertension I10   5. Hypertensive retinopathy of both eyes H35.033   6. 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 Posterior vitreous detachment of right eye H43.811   8. 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 Exudative age related macular degeneration vs CSCR OD    - repeat FA last visit without leakage / pooling into area of SRF  - VA decreased 20/30-2 from 20/25, subjectively worse -- "dark area is larger"   - repeat OCT-A obtained 06.07.19  - OCT today with central SRF slightly improved from 6.7.19  - re-presents today because eplerenone is expensive and pt thought that she needed an injection if she was not able to start eplerenone  - review of systems reveals significant stressors in life, but no exogenous steroid use  - discussed treatment with anti-VEGF vs 25mg  of Eplerenone  - pt unable to afford Eplerenone and wishes to observe again until next visit --  reasonable  - f/u as scheduled (07.12.19)  4,5. Hypertensive retinopathy OU - discussed importance of tight BP control - stable - monitor  6. Lattice degeneration OS- - patch of inferior lattice OS -- no RT, holes or SRF - discussed findings, prognosis, and treatment options including observation - monitor  7. PVD / vitreous syneresis OD-   Discussed findings and prognosis  No RT or RD on 360 peripheral exam  Reviewed s/s of RT/RD  Strict return precautions for any such RT/RD signs/symptoms  monitor  8. Combined form  age-related cataract OU-  - The symptoms of cataract, surgical options, and treatments and risks were discussed with patient. - discussed diagnosis and progression - not yet visually significant - monitor for now  Ophthalmic Meds Ordered this visit:  No orders of the defined types were placed in this encounter.      Return in about 1 month (around 03/09/2018) for as scheduled.  There are no Patient Instructions on file for this visit.   Explained the diagnoses, plan, and follow up with the patient and they expressed understanding.  Patient expressed understanding of the importance of proper follow up care.   This document serves as a record of services personally performed by Gardiner Sleeper, MD, PhD. It was created on their behalf by Ernest Mallick, OA, an ophthalmic assistant. The creation of this record is the provider's dictation and/or activities during the visit.    Electronically signed by: Ernest Mallick, OA  06.12.2019 1:13 AM   This document serves as a record of services personally performed by Gardiner Sleeper, MD, PhD. It was created on their behalf by Catha Brow, [place], a certified ophthalmic assistant. The creation of this record is the provider's dictation and/or activities during the visit.  Electronically signed by: Catha Brow, COA  06.13.19 1:13 AM   Gardiner Sleeper, M.D., Ph.D. Diseases & Surgery of the Retina and Vitreous [facility]  I have reviewed the above documentation for accuracy and completeness, and I agree with the above. Gardiner Sleeper, M.D., Ph.D. 02/12/18 1:17 AM     Abbreviations: M myopia (nearsighted); A astigmatism; H hyperopia (farsighted); P presbyopia; Mrx spectacle prescription;  CTL contact lenses; OD right eye; OS left eye; OU both eyes  XT exotropia; ET esotropia; PEK punctate epithelial keratitis; PEE punctate epithelial erosions; DES dry eye syndrome; MGD meibomian gland dysfunction;  ATs artificial tears;  PFAT's preservative free artificial tears; [place] nuclear sclerotic cataract; PSC posterior subcapsular cataract; ERM epi-retinal membrane; PVD posterior vitreous detachment; RD retinal detachment; DM diabetes mellitus; DR diabetic retinopathy; NPDR non-proliferative diabetic retinopathy; PDR proliferative diabetic retinopathy; CSME clinically significant macular edema; DME diabetic macular edema; dbh dot blot hemorrhages; CWS cotton wool spot; POAG primary open angle glaucoma; C/D cup-to-disc ratio; HVF humphrey visual field; GVF goldmann visual field; OCT optical coherence tomography; IOP intraocular pressure; BRVO Branch retinal vein occlusion; CRVO central retinal vein occlusion; CRAO central retinal artery occlusion; BRAO branch retinal artery occlusion; RT retinal tear; SB scleral buckle; PPV pars plana vitrectomy; VH Vitreous hemorrhage; PRP panretinal laser photocoagulation; IVK intravitreal kenalog; VMT vitreomacular traction; MH Macular hole;  NVD neovascularization of the disc; NVE neovascularization elsewhere; AREDS age related eye disease study; ARMD age related macular degeneration; POAG primary open angle glaucoma; EBMD epithelial/anterior basement membrane dystrophy; ACIOL anterior chamber intraocular lens; IOL intraocular lens; PCIOL posterior chamber intraocular lens; Phaco/IOL phacoemulsification with intraocular lens placement; [place] photorefractive keratectomy; LASIK laser assisted in situ keratomileusis; HTN hypertension; DM diabetes mellitus; COPD chronic obstructive pulmonary disease

## 2018-02-08 ENCOUNTER — Ambulatory Visit (INDEPENDENT_AMBULATORY_CARE_PROVIDER_SITE_OTHER): Payer: Medicare HMO | Admitting: Ophthalmology

## 2018-02-08 ENCOUNTER — Encounter (INDEPENDENT_AMBULATORY_CARE_PROVIDER_SITE_OTHER): Payer: Self-pay | Admitting: Ophthalmology

## 2018-02-08 DIAGNOSIS — H35711 Central serous chorioretinopathy, right eye: Secondary | ICD-10-CM

## 2018-02-08 DIAGNOSIS — H43811 Vitreous degeneration, right eye: Secondary | ICD-10-CM

## 2018-02-08 DIAGNOSIS — H35033 Hypertensive retinopathy, bilateral: Secondary | ICD-10-CM | POA: Diagnosis not present

## 2018-02-08 DIAGNOSIS — H25813 Combined forms of age-related cataract, bilateral: Secondary | ICD-10-CM

## 2018-02-08 DIAGNOSIS — H35412 Lattice degeneration of retina, left eye: Secondary | ICD-10-CM

## 2018-02-08 DIAGNOSIS — H3581 Retinal edema: Secondary | ICD-10-CM

## 2018-02-08 DIAGNOSIS — H353211 Exudative age-related macular degeneration, right eye, with active choroidal neovascularization: Secondary | ICD-10-CM | POA: Diagnosis not present

## 2018-02-08 DIAGNOSIS — I10 Essential (primary) hypertension: Secondary | ICD-10-CM

## 2018-02-26 DIAGNOSIS — I10 Essential (primary) hypertension: Secondary | ICD-10-CM | POA: Diagnosis not present

## 2018-02-26 DIAGNOSIS — Z79899 Other long term (current) drug therapy: Secondary | ICD-10-CM | POA: Diagnosis not present

## 2018-02-26 DIAGNOSIS — Z1339 Encounter for screening examination for other mental health and behavioral disorders: Secondary | ICD-10-CM | POA: Diagnosis not present

## 2018-02-26 DIAGNOSIS — Z6832 Body mass index (BMI) 32.0-32.9, adult: Secondary | ICD-10-CM | POA: Diagnosis not present

## 2018-02-26 DIAGNOSIS — Z1331 Encounter for screening for depression: Secondary | ICD-10-CM | POA: Diagnosis not present

## 2018-02-26 DIAGNOSIS — Z0000 Encounter for general adult medical examination without abnormal findings: Secondary | ICD-10-CM | POA: Diagnosis not present

## 2018-02-26 DIAGNOSIS — Z7189 Other specified counseling: Secondary | ICD-10-CM | POA: Diagnosis not present

## 2018-02-26 DIAGNOSIS — Z299 Encounter for prophylactic measures, unspecified: Secondary | ICD-10-CM | POA: Diagnosis not present

## 2018-02-26 DIAGNOSIS — E039 Hypothyroidism, unspecified: Secondary | ICD-10-CM | POA: Diagnosis not present

## 2018-02-26 DIAGNOSIS — E7800 Pure hypercholesterolemia, unspecified: Secondary | ICD-10-CM | POA: Diagnosis not present

## 2018-03-07 NOTE — Progress Notes (Signed)
[facility] Note  03/09/2018     CHIEF COMPLAINT Patient presents for Retina Follow Up   HISTORY OF PRESENT ILLNESS: Darlene Crawford is a 77 y.o. female who presents to the clinic today for:   HPI    Retina Follow Up    Patient presents with  Wet AMD.  In right eye.  This started 5 months ago.  Severity is moderate.  Duration of 4 weeks.  Since onset it is gradually worsening.  I, the attending physician,  performed the HPI with the patient and updated documentation appropriately.          Comments    Patient states distortion has gotten slightly worse (minor change) OD. Vision seems good OS. Patient denies pain/discomfort. No floaters, flashes, etc.       Last edited by Bernarda Caffey, MD on 03/09/2018 11:43 AM. (History)      Referring physician: Monico Blitz, MD [place], [place] [address]  HISTORICAL INFORMATION:   Selected notes from the MEDICAL RECORD NUMBER Referred by Dr. Leander Rams for concern of ARMD OD with progression to CNVM vs CSR;  LEE- 01.29.19 (R. Davis) [BCVA OD: 20/100 OS: 20/30] Ocular Hx- AMD OU, cataract OU, DES OU, HTN retinopathy OU, WWP OU, choroidal nevus OD;  PMH- elevated chol, HTN, hyperthyoridism    CURRENT MEDICATIONS: Current Outpatient Medications (Ophthalmic Drugs)  Medication Sig  . Artificial Tear Ointment (DRY EYES OP) Apply to eye.   No current facility-administered medications for this visit.  (Ophthalmic Drugs)   Current Outpatient Medications (Other)  Medication Sig  . atorvastatin (LIPITOR) 10 MG tablet   . b complex vitamins capsule Take 1 capsule by mouth daily.  . calcium citrate-vitamin D (CITRACAL+D) 315-200 MG-UNIT tablet Take 1 tablet by mouth 2 (two) times daily.  . diclofenac (VOLTAREN) 50 MG EC tablet   . eplerenone (INSPRA) 25 MG tablet Take 1 tablet (25 mg total) by mouth daily.  . hydrochlorothiazide (HYDRODIURIL) 25 MG tablet   . levothyroxine (SYNTHROID, LEVOTHROID) 100 MCG  tablet Take 100 mcg by mouth daily before breakfast.  . losartan (COZAAR) 100 MG tablet   . Melatonin-Pyridoxine (MELATIN PO) Take by mouth.  . Multiple Vitamins-Minerals (PRESERVISION AREDS 2 PO) Take by mouth.  . Omega-3 Fatty Acids (EQL FISH OIL PO) Take by mouth.  . sertraline (ZOLOFT) 50 MG tablet Take 50 mg by mouth daily.   No current facility-administered medications for this visit.  (Other)      REVIEW OF SYSTEMS: ROS    Positive for: Eyes   Negative for: Constitutional, Gastrointestinal, Neurological, Skin, Genitourinary, Musculoskeletal, HENT, Endocrine, Cardiovascular, Respiratory, Psychiatric, Allergic/Imm, Heme/Lymph   Last edited by Roselee Nova D on 03/09/2018 10:17 AM. (History)       ALLERGIES No Known Allergies  PAST MEDICAL HISTORY Past Medical History:  Diagnosis Date  . Hypertension   . Osteoarthritis    Past Surgical History:  Procedure Laterality Date  . TUBAL LIGATION    . UTERINE FIBROID SURGERY      FAMILY HISTORY Family History  Problem Relation Age of Onset  . Cancer Mother        lung  . Stroke Father   . Stroke Brother     SOCIAL HISTORY Social History   Tobacco Use  . Smoking status: Never Smoker  . Smokeless tobacco: Never Used  Substance Use Topics  . Alcohol use: No    Frequency: Never  . Drug use: No  OPHTHALMIC EXAM:  Base Eye Exam    Visual Acuity (Snellen - Linear)      Right Left   Dist cc 20/40 -2 20/25 -2   Dist ph cc 20/40 +2 20/20 -2   Correction:  Glasses       Tonometry (Tonopen, 10:26 AM)      Right Left   Pressure 17 16       Pupils      Dark Light Shape React APD   Right 3 2 Round Slow None   Left 3 2 Round Slow None       Visual Fields (Counting fingers)      Left Right    Full Full       Extraocular Movement      Right Left    Full, Ortho Full, Ortho       Neuro/Psych    Oriented x3:  Yes   Mood/Affect:  Normal       Dilation    Both eyes:  1.0% Mydriacyl, 2.5%  Phenylephrine @ 10:26 AM        Slit Lamp and Fundus Exam    Slit Lamp Exam      Right Left   Lids/Lashes Dermatochalasis - upper lid Dermatochalasis - upper lid   Conjunctiva/Sclera White and quiet White and quiet   Cornea Arcus, early nasal band K Arcus, early nasal band K   Anterior Chamber moderate depth, with narrow angle temporally moderate depth, with narrow angle temporally   Iris Round and well dilated Round and well dilated   Lens 2+ Nuclear sclerosis, 2+ Cortical cataract 2+ Nuclear sclerosis, 2+ Cortical cataract   Vitreous Vitreous syneresis, Posterior vitreous detachment, Weiss ring Vitreous syneresis       Fundus Exam      Right Left   Disc Normal Normal   C/D Ratio 0.3 0.3   Macula Blunted foveal reflex, Drusen, no heme, RPE mottling and clumping, central SRF -- mild interval improvement Flat, Retinal pigment epithelial mottling,superior para-foveal focal area of RPE atrophy, rare drusen, No heme or edema   Vessels Copper wiring, AV crossing changes, Tortuous Copper wiring, AV crossing changes, mildly Tortuous   Periphery Attached, peripheral drusen / RPE changes nasally, peripheral cystoid degeneration Patch of Lattice degeneration at 0600, scattered peripheral drusen, peripheral cystoid degeneration        Refraction    Wearing Rx      Sphere Cylinder Axis Add   Right +2.00 +1.25 158 +2.50   Left +2.00 +0.75 002 +2.50   Type:  PAL       Manifest Refraction      Sphere Cylinder Axis Dist VA   Right +2.50 +1.00 012 20/30-1   Left +2.00 +0.75 180 20/20-1          IMAGING AND PROCEDURES  Imaging and Procedures for 12/11/17  OCT, Retina - OU - Both Eyes       Right Eye Quality was good. Central Foveal Thickness: 393. Progression has improved. Findings include subretinal fluid, no IRF, retinal drusen , pigment epithelial detachment, abnormal foveal contour (Interval improvement but persistent SRF).   Left Eye Quality was good. Central Foveal  Thickness: 274. Progression has been stable. Findings include normal foveal contour, no IRF, no SRF, retinal drusen .   Notes *Images captured and stored on drive  Diagnosis / Impression:  OD: interval improvement but persistent sub-foveal SRF OS: NFP, No IRF/SRF  Clinical management:  See below  Abbreviations: NFP - Normal foveal profile.  CME - cystoid macular edema. PED - pigment epithelial detachment. IRF - intraretinal fluid. SRF - subretinal fluid. EZ - ellipsoid zone. ERM - epiretinal membrane. ORA - outer retinal atrophy. ORT - outer retinal tubulation. SRHM - subretinal hyper-reflective material                  ASSESSMENT/PLAN:    ICD-10-CM   1. Exudative age-related macular degeneration of right eye with active choroidal neovascularization (HCC) H35.3211 OCT, Retina - OU - Both Eyes  2. 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 Retinal edema H35.81 OCT, Retina - OU - Both Eyes  4. Essential hypertension I10   5. Hypertensive retinopathy of both eyes H35.033   6. 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 Posterior vitreous detachment of right eye H43.811   8. 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 Exudative age related macular degeneration vs CSCR OD    - repeat FA last visit without leakage / pooling into area of SRF  - VA 20/40 OD from 20/25, pt reports persistent blurry spot   - repeat OCT-A obtained  - OCT today with central SRF slightly improved from 6.7.19  - review of systems reveals significant stressors in life, but no exogenous steroid use  - discussed treatment with anti-VEGF vs 25mg  of Eplerenone  - pt unable to afford Eplerenone and wishes to observe again until next visit -- reasonable  - f/u 4 wks -- possible injection if SRF persists or worsens  4,5. Hypertensive retinopathy OU - discussed importance of tight BP control - stable - monitor  6. Lattice degeneration OS- - patch of inferior  lattice OS -- no RT, holes or SRF - discussed findings, prognosis, and treatment options including observation - monitor  7. PVD / vitreous syneresis OD-   Discussed findings and prognosis  No RT or RD on 360 peripheral exam  Reviewed s/s of RT/RD  Strict return precautions for any such RT/RD signs/symptoms  8. Combined form age-related cataract OU-  - The symptoms of cataract, surgical options, and treatments and risks were discussed with patient. - discussed diagnosis and progression - not yet visually significant - monitor for now  Ophthalmic Meds Ordered this visit:  No orders of the defined types were placed in this encounter.      Return in about 1 month (around 04/06/2018) for Dilated Exam, OCT.  There are no Patient Instructions on file for this visit.   Explained the diagnoses, plan, and follow up with the patient and they expressed understanding.  Patient expressed understanding of the importance of proper follow up care.   This document serves as a record of services personally performed by Gardiner Sleeper, MD, PhD. It was created on their behalf by Catha Brow, [place], a certified ophthalmic assistant. The creation of this record is the provider's dictation and/or activities during the visit.  Electronically signed by: Catha Brow, [place]  07.10.19 11:23 AM   Gardiner Sleeper, M.D., Ph.D. Diseases & Surgery of the Retina and Vitreous [facility]  I have reviewed the above documentation for accuracy and completeness, and I agree with the above. Gardiner Sleeper, M.D., Ph.D. 03/12/18 11:27 AM     Abbreviations: M myopia (nearsighted); A astigmatism; H hyperopia (farsighted); P presbyopia; Mrx spectacle prescription;  CTL contact lenses; OD right eye; OS left eye; OU both eyes  XT exotropia; ET esotropia; PEK punctate epithelial keratitis;  PEE punctate epithelial erosions; DES dry eye syndrome; MGD meibomian gland dysfunction; ATs artificial  tears; PFAT's preservative free artificial tears; [place] nuclear sclerotic cataract; PSC posterior subcapsular cataract; ERM epi-retinal membrane; PVD posterior vitreous detachment; RD retinal detachment; DM diabetes mellitus; DR diabetic retinopathy; NPDR non-proliferative diabetic retinopathy; PDR proliferative diabetic retinopathy; CSME clinically significant macular edema; DME diabetic macular edema; dbh dot blot hemorrhages; CWS cotton wool spot; POAG primary open angle glaucoma; C/D cup-to-disc ratio; HVF humphrey visual field; GVF goldmann visual field; OCT optical coherence tomography; IOP intraocular pressure; BRVO Branch retinal vein occlusion; CRVO central retinal vein occlusion; CRAO central retinal artery occlusion; BRAO branch retinal artery occlusion; RT retinal tear; SB scleral buckle; PPV pars plana vitrectomy; VH Vitreous hemorrhage; PRP panretinal laser photocoagulation; IVK intravitreal kenalog; VMT vitreomacular traction; MH Macular hole;  NVD neovascularization of the disc; NVE neovascularization elsewhere; AREDS age related eye disease study; ARMD age related macular degeneration; POAG primary open angle glaucoma; EBMD epithelial/anterior basement membrane dystrophy; ACIOL anterior chamber intraocular lens; IOL intraocular lens; PCIOL posterior chamber intraocular lens; Phaco/IOL phacoemulsification with intraocular lens placement; [place] photorefractive keratectomy; LASIK laser assisted in situ keratomileusis; HTN hypertension; DM diabetes mellitus; COPD chronic obstructive pulmonary disease

## 2018-03-09 ENCOUNTER — Ambulatory Visit (INDEPENDENT_AMBULATORY_CARE_PROVIDER_SITE_OTHER): Payer: Medicare HMO | Admitting: Ophthalmology

## 2018-03-09 ENCOUNTER — Encounter (INDEPENDENT_AMBULATORY_CARE_PROVIDER_SITE_OTHER): Payer: Self-pay | Admitting: Ophthalmology

## 2018-03-09 DIAGNOSIS — H43811 Vitreous degeneration, right eye: Secondary | ICD-10-CM

## 2018-03-09 DIAGNOSIS — H35412 Lattice degeneration of retina, left eye: Secondary | ICD-10-CM | POA: Diagnosis not present

## 2018-03-09 DIAGNOSIS — H35033 Hypertensive retinopathy, bilateral: Secondary | ICD-10-CM | POA: Diagnosis not present

## 2018-03-09 DIAGNOSIS — H3581 Retinal edema: Secondary | ICD-10-CM

## 2018-03-09 DIAGNOSIS — H35711 Central serous chorioretinopathy, right eye: Secondary | ICD-10-CM

## 2018-03-09 DIAGNOSIS — I10 Essential (primary) hypertension: Secondary | ICD-10-CM

## 2018-03-09 DIAGNOSIS — H25813 Combined forms of age-related cataract, bilateral: Secondary | ICD-10-CM

## 2018-03-09 DIAGNOSIS — H353211 Exudative age-related macular degeneration, right eye, with active choroidal neovascularization: Secondary | ICD-10-CM

## 2018-03-19 ENCOUNTER — Encounter: Payer: Self-pay | Admitting: Nurse Practitioner

## 2018-03-19 DIAGNOSIS — Z1212 Encounter for screening for malignant neoplasm of rectum: Secondary | ICD-10-CM | POA: Diagnosis not present

## 2018-03-19 DIAGNOSIS — Z1211 Encounter for screening for malignant neoplasm of colon: Secondary | ICD-10-CM | POA: Diagnosis not present

## 2018-03-19 LAB — COLOGUARD

## 2018-04-05 NOTE — Progress Notes (Signed)
[facility] Note  04/06/2018     CHIEF COMPLAINT Patient presents for Retina Follow Up   HISTORY OF PRESENT ILLNESS: Darlene Crawford is a 77 y.o. female who presents to the clinic today for:   HPI    Retina Follow Up    Patient presents with  Wet AMD.  In right eye.  This started 2 months ago.  Severity is mild.  Since onset it is stable.  I, the attending physician,  performed the HPI with the patient and updated documentation appropriately.          Comments    F/U EXU AMD OD. Patient states she continues to see a "dark circle" when she closes left eye. Denies flashes ,floaters and ocular pain.       Last edited by Bernarda Caffey, MD on 04/06/2018  1:02 PM. (History)      Referring physician: Monico Blitz, MD [place], [place] [address]  HISTORICAL INFORMATION:   Selected notes from the MEDICAL RECORD NUMBER Referred by Dr. Leander Rams for concern of ARMD OD with progression to CNVM vs CSR;  LEE- 01.29.19 (R. Davis) [BCVA OD: 20/100 OS: 20/30] Ocular Hx- AMD OU, cataract OU, DES OU, HTN retinopathy OU, WWP OU, choroidal nevus OD;  PMH- elevated chol, HTN, hyperthyoridism    CURRENT MEDICATIONS: Current Outpatient Medications (Ophthalmic Drugs)  Medication Sig  . Artificial Tear Ointment (DRY EYES OP) Apply to eye.   No current facility-administered medications for this visit.  (Ophthalmic Drugs)   Current Outpatient Medications (Other)  Medication Sig  . atorvastatin (LIPITOR) 10 MG tablet   . b complex vitamins capsule Take 1 capsule by mouth daily.  . calcium citrate-vitamin D (CITRACAL+D) 315-200 MG-UNIT tablet Take 1 tablet by mouth 2 (two) times daily.  . diclofenac (VOLTAREN) 50 MG EC tablet   . eplerenone (INSPRA) 25 MG tablet Take 1 tablet (25 mg total) by mouth daily.  . hydrochlorothiazide (HYDRODIURIL) 25 MG tablet   . levothyroxine (SYNTHROID, LEVOTHROID) 100 MCG tablet Take 100 mcg by mouth daily before breakfast.  .  losartan (COZAAR) 100 MG tablet   . Melatonin-Pyridoxine (MELATIN PO) Take by mouth.  . Multiple Vitamins-Minerals (PRESERVISION AREDS 2 PO) Take by mouth.  . Omega-3 Fatty Acids (EQL FISH OIL PO) Take by mouth.  . sertraline (ZOLOFT) 50 MG tablet Take 50 mg by mouth daily.   No current facility-administered medications for this visit.  (Other)      REVIEW OF SYSTEMS: ROS    Positive for: Skin, Eyes   Negative for: Constitutional, Gastrointestinal, Neurological, Genitourinary, Musculoskeletal, HENT, Endocrine, Cardiovascular, Respiratory, Psychiatric, Allergic/Imm, Heme/Lymph   Last edited by Zenovia Jordan, LPN on 6/0/6301 60:10 AM. (History)       ALLERGIES No Known Allergies  PAST MEDICAL HISTORY Past Medical History:  Diagnosis Date  . Hypertension   . Osteoarthritis    Past Surgical History:  Procedure Laterality Date  . TUBAL LIGATION    . UTERINE FIBROID SURGERY      FAMILY HISTORY Family History  Problem Relation Age of Onset  . Cancer Mother        lung  . Stroke Father   . Stroke Brother     SOCIAL HISTORY Social History   Tobacco Use  . Smoking status: Never Smoker  . Smokeless tobacco: Never Used  Substance Use Topics  . Alcohol use: No    Frequency: Never  . Drug use: No  OPHTHALMIC EXAM:  Base Eye Exam    Visual Acuity (Snellen - Linear)      Right Left   Dist cc 20/30 20/20   Dist ph cc NI    Correction:  Glasses       Tonometry (Tonopen, 10:50 AM)      Right Left   Pressure 10 10       Pupils      Dark Light Shape React APD   Right 3 2 Round Brisk None   Left 3 2 Round Brisk None       Visual Fields (Counting fingers)      Left Right    Full Full       Extraocular Movement      Right Left    Full, Ortho Full, Ortho       Neuro/Psych    Oriented x3:  Yes   Mood/Affect:  Normal       Dilation    Both eyes:  1.0% Mydriacyl, 2.5% Phenylephrine @ 10:50 AM        Slit Lamp and Fundus Exam    Slit  Lamp Exam      Right Left   Lids/Lashes Dermatochalasis - upper lid Dermatochalasis - upper lid   Conjunctiva/Sclera White and quiet White and quiet   Cornea Arcus, early nasal band K Arcus, early nasal band K   Anterior Chamber moderate depth, with narrow angle temporally moderate depth, with narrow angle temporally   Iris Round and well dilated Round and well dilated   Lens 2+ Nuclear sclerosis, 2+ Cortical cataract 2+ Nuclear sclerosis, 2+ Cortical cataract   Vitreous Vitreous syneresis, Posterior vitreous detachment, Weiss ring Vitreous syneresis       Fundus Exam      Right Left   Disc Pink and Sharp Pink and Sharp   C/D Ratio 0.3 0.4   Macula Blunted foveal reflex, Drusen, RPE mottling and clumping, central SRF -- mild interval improvement, no heme Flat, Retinal pigment epithelial mottling,superior para-foveal focal area of RPE atrophy, rare drusen, No heme or edema   Vessels Copper wiring, AV crossing changes, Tortuous Copper wiring, AV crossing changes, mildly Tortuous   Periphery Attached, peripheral drusen / RPE changes nasally, peripheral cystoid degeneration Patch of Lattice degeneration at 0600, scattered peripheral drusen, peripheral cystoid degeneration          IMAGING AND PROCEDURES  Imaging and Procedures for 12/11/17  OCT, Retina - OU - Both Eyes       Right Eye Quality was good. Central Foveal Thickness: 362. Progression has improved. Findings include subretinal fluid, no IRF, retinal drusen , pigment epithelial detachment, abnormal foveal contour (Interval improvement but persistent SRF).   Left Eye Quality was good. Central Foveal Thickness: 277. Progression has been stable. Findings include normal foveal contour, no IRF, no SRF, retinal drusen .   Notes *Images captured and stored on drive  Diagnosis / Impression:  OD: interval improvement but persistent sub-foveal SRF OS: NFP, No IRF/SRF  Clinical management:  See below  Abbreviations: NFP -  Normal foveal profile. CME - cystoid macular edema. PED - pigment epithelial detachment. IRF - intraretinal fluid. SRF - subretinal fluid. EZ - ellipsoid zone. ERM - epiretinal membrane. ORA - outer retinal atrophy. ORT - outer retinal tubulation. SRHM - subretinal hyper-reflective material                  ASSESSMENT/PLAN:    ICD-10-CM   1. Exudative age-related macular degeneration of right eye  with active choroidal neovascularization ([place]) H35.3211 OCT, Retina - OU - Both Eyes  2. 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 Retinal edema H35.81 OCT, Retina - OU - Both Eyes  4. Essential hypertension I10   5. Hypertensive retinopathy of both eyes H35.033   6. 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 Posterior vitreous detachment of right eye H43.811   8. 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 Exudative age related macular degeneration vs CSCR OD    - repeat FA last visit without leakage / pooling into area of SRF  - today, VA 20/30 OD from 20/40, pt reports subjective improvement in blurry spot  - OCT today with central SRF slightly improved from prior  - review of systems reveals significant stressors in life, but no exogenous steroid use  - discussed treatment with anti-VEGF vs 25mg  of Eplerenone  - pt unable to afford Eplerenone   - pt wishes to continue to observe/monitor -- reasonable  - f/u 6 weeks -- possible injection if SRF persists or worsens  4,5. Hypertensive retinopathy OU - discussed importance of tight BP control - stable - monitor  6. Lattice degeneration OS- - patch of inferior lattice OS -- no RT, holes or SRF - discussed findings, prognosis, and treatment options including observation - monitor  7. PVD / vitreous syneresis OD-   Discussed findings and prognosis  No RT or RD on 360 peripheral exam  Reviewed s/s of RT/RD  Strict return precautions for any such RT/RD signs/symptoms  8. Combined  form age-related cataract OU-  - The symptoms of cataract, surgical options, and treatments and risks were discussed with patient. - discussed diagnosis and progression - not yet visually significant - monitor for now  Ophthalmic Meds Ordered this visit:  No orders of the defined types were placed in this encounter.      Return in about 6 weeks (around 05/18/2018) for F/U Exu AMD OD, DFE, OCT.  There are no Patient Instructions on file for this visit.   Explained the diagnoses, plan, and follow up with the patient and they expressed understanding.  Patient expressed understanding of the importance of proper follow up care.   This document serves as a record of services personally performed by Gardiner Sleeper, MD, PhD. It was created on their behalf by Catha Brow, [place], a certified ophthalmic assistant. The creation of this record is the provider's dictation and/or activities during the visit.  Electronically signed by: Catha Brow, [place]  08.08.19 1:03 PM   Gardiner Sleeper, M.D., Ph.D. Diseases & Surgery of the Retina and Vitreous [facility]  I have reviewed the above documentation for accuracy and completeness, and I agree with the above. Gardiner Sleeper, M.D., Ph.D. 04/06/18 1:05 PM    Abbreviations: M myopia (nearsighted); A astigmatism; H hyperopia (farsighted); P presbyopia; Mrx spectacle prescription;  CTL contact lenses; OD right eye; OS left eye; OU both eyes  XT exotropia; ET esotropia; PEK punctate epithelial keratitis; PEE punctate epithelial erosions; DES dry eye syndrome; MGD meibomian gland dysfunction; ATs artificial tears; PFAT's preservative free artificial tears; [place] nuclear sclerotic cataract; PSC posterior subcapsular cataract; ERM epi-retinal membrane; PVD posterior vitreous detachment; RD retinal detachment; DM diabetes mellitus; DR diabetic retinopathy; NPDR non-proliferative diabetic retinopathy; PDR proliferative diabetic  retinopathy; CSME clinically significant macular edema; DME diabetic macular edema; dbh dot blot hemorrhages; CWS cotton wool spot; POAG primary open  angle glaucoma; C/D cup-to-disc ratio; HVF humphrey visual field; GVF goldmann visual field; OCT optical coherence tomography; IOP intraocular pressure; BRVO Branch retinal vein occlusion; CRVO central retinal vein occlusion; CRAO central retinal artery occlusion; BRAO branch retinal artery occlusion; RT retinal tear; SB scleral buckle; PPV pars plana vitrectomy; VH Vitreous hemorrhage; PRP panretinal laser photocoagulation; IVK intravitreal kenalog; VMT vitreomacular traction; MH Macular hole;  NVD neovascularization of the disc; NVE neovascularization elsewhere; AREDS age related eye disease study; ARMD age related macular degeneration; POAG primary open angle glaucoma; EBMD epithelial/anterior basement membrane dystrophy; ACIOL anterior chamber intraocular lens; IOL intraocular lens; PCIOL posterior chamber intraocular lens; Phaco/IOL phacoemulsification with intraocular lens placement; [place] photorefractive keratectomy; LASIK laser assisted in situ keratomileusis; HTN hypertension; DM diabetes mellitus; COPD chronic obstructive pulmonary disease

## 2018-04-06 ENCOUNTER — Encounter (INDEPENDENT_AMBULATORY_CARE_PROVIDER_SITE_OTHER): Payer: Self-pay | Admitting: Ophthalmology

## 2018-04-06 ENCOUNTER — Ambulatory Visit (INDEPENDENT_AMBULATORY_CARE_PROVIDER_SITE_OTHER): Payer: Medicare HMO | Admitting: Ophthalmology

## 2018-04-06 DIAGNOSIS — H35033 Hypertensive retinopathy, bilateral: Secondary | ICD-10-CM | POA: Diagnosis not present

## 2018-04-06 DIAGNOSIS — H35711 Central serous chorioretinopathy, right eye: Secondary | ICD-10-CM

## 2018-04-06 DIAGNOSIS — H3581 Retinal edema: Secondary | ICD-10-CM

## 2018-04-06 DIAGNOSIS — H25813 Combined forms of age-related cataract, bilateral: Secondary | ICD-10-CM

## 2018-04-06 DIAGNOSIS — H353211 Exudative age-related macular degeneration, right eye, with active choroidal neovascularization: Secondary | ICD-10-CM

## 2018-04-06 DIAGNOSIS — H35412 Lattice degeneration of retina, left eye: Secondary | ICD-10-CM

## 2018-04-06 DIAGNOSIS — I10 Essential (primary) hypertension: Secondary | ICD-10-CM

## 2018-04-06 DIAGNOSIS — H43811 Vitreous degeneration, right eye: Secondary | ICD-10-CM | POA: Diagnosis not present

## 2018-04-10 DIAGNOSIS — Z299 Encounter for prophylactic measures, unspecified: Secondary | ICD-10-CM | POA: Diagnosis not present

## 2018-04-10 DIAGNOSIS — Z713 Dietary counseling and surveillance: Secondary | ICD-10-CM | POA: Diagnosis not present

## 2018-04-10 DIAGNOSIS — R195 Other fecal abnormalities: Secondary | ICD-10-CM | POA: Diagnosis not present

## 2018-04-10 DIAGNOSIS — I10 Essential (primary) hypertension: Secondary | ICD-10-CM | POA: Diagnosis not present

## 2018-04-10 DIAGNOSIS — Z6832 Body mass index (BMI) 32.0-32.9, adult: Secondary | ICD-10-CM | POA: Diagnosis not present

## 2018-04-18 ENCOUNTER — Encounter: Payer: Self-pay | Admitting: Internal Medicine

## 2018-05-08 DIAGNOSIS — M8580 Other specified disorders of bone density and structure, unspecified site: Secondary | ICD-10-CM | POA: Diagnosis not present

## 2018-05-08 DIAGNOSIS — E2839 Other primary ovarian failure: Secondary | ICD-10-CM | POA: Diagnosis not present

## 2018-05-11 DIAGNOSIS — R69 Illness, unspecified: Secondary | ICD-10-CM | POA: Diagnosis not present

## 2018-05-18 ENCOUNTER — Encounter (INDEPENDENT_AMBULATORY_CARE_PROVIDER_SITE_OTHER): Payer: Medicare HMO | Admitting: Ophthalmology

## 2018-05-24 NOTE — Progress Notes (Signed)
[facility] Note  05/25/2018     CHIEF COMPLAINT Patient presents for Retina Follow Up   HISTORY OF PRESENT ILLNESS: Darlene Crawford is a 77 y.o. female who presents to the clinic today for:   HPI    Retina Follow Up    Patient presents with  Wet AMD.  In right eye.  Severity is moderate.  Duration of 6 weeks.  Since onset it is stable.  I, the attending physician,  performed the HPI with the patient and updated documentation appropriately.          Comments    Pt presents for exu ARMD OD f/u, pt states VA is the same as last time, pt denies flashes, floaters, pain and wavy vision, pt uses OTC gtts PRN for dryness and she takes preservision vits       Last edited by Bernarda Caffey, MD on 05/25/2018  1:55 PM. (History)      Referring physician: Monico Blitz, MD [place], [place] [address]  HISTORICAL INFORMATION:   Selected notes from the MEDICAL RECORD NUMBER Referred by Dr. Leander Rams for concern of ARMD OD with progression to CNVM vs CSR;  LEE- 01.29.19 (R. Davis) [BCVA OD: 20/100 OS: 20/30] Ocular Hx- AMD OU, cataract OU, DES OU, HTN retinopathy OU, WWP OU, choroidal nevus OD;  PMH- elevated chol, HTN, hyperthyoridism    CURRENT MEDICATIONS: Current Outpatient Medications (Ophthalmic Drugs)  Medication Sig  . Artificial Tear Ointment (DRY EYES OP) Apply to eye.   No current facility-administered medications for this visit.  (Ophthalmic Drugs)   Current Outpatient Medications (Other)  Medication Sig  . dextromethorphan-guaiFENesin (MUCINEX DM) 30-600 MG 12hr tablet Take 1 tablet by mouth 2 (two) times daily.  . fluticasone (FLONASE) 50 MCG/ACT nasal spray Place into both nostrils daily.  Marland Kitchen loratadine (CLARITIN) 10 MG tablet Take 10 mg by mouth daily.  . pseudoephedrine-guaifenesin (TUSSIN PE) 30-100 MG/5ML SYRP Take 5 mLs by mouth every 4 (four) hours as needed.  Marland Kitchen atorvastatin (LIPITOR) 10 MG tablet   . b complex vitamins capsule  Take 1 capsule by mouth daily.  . calcium citrate-vitamin D (CITRACAL+D) 315-200 MG-UNIT tablet Take 1 tablet by mouth 2 (two) times daily.  . diclofenac (VOLTAREN) 50 MG EC tablet   . eplerenone (INSPRA) 25 MG tablet Take 1 tablet (25 mg total) by mouth daily.  . hydrochlorothiazide (HYDRODIURIL) 25 MG tablet   . levothyroxine (SYNTHROID, LEVOTHROID) 100 MCG tablet Take 100 mcg by mouth daily before breakfast.  . losartan (COZAAR) 100 MG tablet   . Melatonin-Pyridoxine (MELATIN PO) Take by mouth.  . Multiple Vitamins-Minerals (PRESERVISION AREDS 2 PO) Take by mouth.  . Omega-3 Fatty Acids (EQL FISH OIL PO) Take by mouth.  . sertraline (ZOLOFT) 50 MG tablet Take 50 mg by mouth daily.   Current Facility-Administered Medications (Other)  Medication Route  . Bevacizumab (AVASTIN) SOLN 1.25 mg Intravitreal      REVIEW OF SYSTEMS: ROS    Positive for: Musculoskeletal, Cardiovascular, Eyes   Negative for: Constitutional, Gastrointestinal, Neurological, Skin, Genitourinary, HENT, Endocrine, Respiratory, Psychiatric, Allergic/Imm, Heme/Lymph   Last edited by Debbrah Alar, COT on 05/25/2018  1:09 PM. (History)       ALLERGIES No Known Allergies  PAST MEDICAL HISTORY Past Medical History:  Diagnosis Date  . Hypertension   . Osteoarthritis    Past Surgical History:  Procedure Laterality Date  . TUBAL LIGATION    . UTERINE FIBROID SURGERY  FAMILY HISTORY Family History  Problem Relation Age of Onset  . Cancer Mother        lung  . Stroke Father   . Stroke Brother     SOCIAL HISTORY Social History   Tobacco Use  . Smoking status: Never Smoker  . Smokeless tobacco: Never Used  Substance Use Topics  . Alcohol use: No    Frequency: Never  . Drug use: No         OPHTHALMIC EXAM:  Base Eye Exam    Visual Acuity (Snellen - Linear)      Right Left   Dist [place] 20/50 -2 20/20 -1   Dist ph [place] 20/50 NI       Tonometry (Tonopen, 1:14 PM)      Right Left    Pressure 15 15       Pupils      Dark Light Shape React APD   Right 4 2 Round Brisk None   Left 4 2 Round Brisk None       Visual Fields (Counting fingers)      Left Right    Full Full       Extraocular Movement      Right Left    Full, Ortho Full, Ortho       Neuro/Psych    Oriented x3:  Yes   Mood/Affect:  Normal       Dilation    Both eyes:  1.0% Mydriacyl, 2.5% Phenylephrine @ 1:14 PM        Slit Lamp and Fundus Exam    Slit Lamp Exam      Right Left   Lids/Lashes Dermatochalasis - upper lid Dermatochalasis - upper lid   Conjunctiva/Sclera White and quiet White and quiet   Cornea Arcus, early nasal band K Arcus, early nasal band K   Anterior Chamber moderate depth, with narrow angle temporally moderate depth, with narrow angle temporally   Iris Round and well dilated Round and well dilated   Lens 2+ Nuclear sclerosis, 2+ Cortical cataract 2+ Nuclear sclerosis, 2+ Cortical cataract   Vitreous Vitreous syneresis, Posterior vitreous detachment, Weiss ring Vitreous syneresis       Fundus Exam      Right Left   Disc Pink and Sharp Pink and Sharp, Tilted disc   C/D Ratio 0.3 0.4   Macula Blunted foveal reflex, Drusen, RPE mottling and clumping, central SRF and edema--slightly worse, no heme Flat, Retinal pigment epithelial mottling, superior para-foveal focal area of RPE atrophy, rare drusen, No heme or edema   Vessels Copper wiring, AV crossing changes, Tortuous, Vascular attenuation Copper wiring, AV crossing changes, mildly Tortuous   Periphery Attached, peripheral drusen / RPE changes nasally, peripheral cystoid degeneration Patch of Lattice degeneration at 0600, scattered peripheral drusen, peripheral cystoid degeneration          IMAGING AND PROCEDURES  Imaging and Procedures for 12/11/17  OCT, Retina - OU - Both Eyes       Right Eye Quality was good. Central Foveal Thickness: 483. Progression has worsened. Findings include subretinal fluid, no IRF,  retinal drusen , pigment epithelial detachment, abnormal foveal contour (Interval increase in SRF).   Left Eye Quality was good. Central Foveal Thickness: 278. Progression has been stable. Findings include normal foveal contour, no IRF, no SRF, retinal drusen .   Notes *Images captured and stored on drive  Diagnosis / Impression:  OD: interval increase in sub-foveal SRF OS: NFP, No IRF/SRF  Clinical management:  See  below  Abbreviations: NFP - Normal foveal profile. CME - cystoid macular edema. PED - pigment epithelial detachment. IRF - intraretinal fluid. SRF - subretinal fluid. EZ - ellipsoid zone. ERM - epiretinal membrane. ORA - outer retinal atrophy. ORT - outer retinal tubulation. SRHM - subretinal hyper-reflective material         Intravitreal Injection, Pharmacologic Agent - OD - Right Eye       Time Out 05/25/2018. 2:07 PM. Confirmed correct patient, procedure, site, and patient consented.   Anesthesia Topical anesthesia was used. Anesthetic medications included Lidocaine 2%, Tetracaine 0.5%.   Procedure Preparation included 5% betadine to ocular surface, eyelid speculum. A 30 gauge needle was used.   Injection:  1.25 mg Bevacizumab 1.25mg /0.16ml   NDC: 35573-220-25, Lot: [id number] , Expiration date: 06/30/2018   Route: Intravitreal, Site: Right Eye, Waste: 0 mg  Post-op Post injection exam found visual acuity of at least counting fingers. The patient tolerated the procedure well. There were no complications. The patient received written and verbal post procedure care education.                 ASSESSMENT/PLAN:    ICD-10-CM   1. Exudative age-related macular degeneration of right eye with active choroidal neovascularization (HCC) H35.3211 OCT, Retina - OU - Both Eyes    Intravitreal Injection, Pharmacologic Agent - OD - Right Eye    Bevacizumab (AVASTIN) SOLN 1.25 mg  2. 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 Retinal edema H35.81 OCT, Retina - OU - Both Eyes  4. Essential hypertension I10   5. Hypertensive retinopathy of both eyes H35.033   6. 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 Posterior vitreous detachment of right eye H43.811   8. 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 Exudative age related macular degeneration vs CSCR OD    - repeat FA (03.02.19) without leakage / pooling into area of SRF  - today, VA 20/50 OD from 20/30, pt reports subjective increase in blurry spot and worse vision  - OCT today with central SRF increased from prior  - review of systems reveals significant stressors in life, but no exogenous steroid use  - discussed treatment with anti-VEGF vs 25mg  of Eplerenone  - pt unable to afford Eplerenone   - recommend IVA OD #1 today  - pt wishes to proceed  - RBA of procedure discussed, questions answered  - informed consent obtained and signed  - see procedure note  - f/u 4 weeks  4,5. Hypertensive retinopathy OU - discussed importance of tight BP control - stable - monitor  6. Lattice degeneration OS- - patch of inferior lattice OS -- no RT, holes or SRF - discussed findings, prognosis, and treatment options including observation - monitor  7. PVD / vitreous syneresis OD-   Discussed findings and prognosis  No RT or RD on 360 peripheral exam  Reviewed s/s of RT/RD  Strict return precautions for any such RT/RD signs/symptoms  8. Combined form age-related cataract OU-  - The symptoms of cataract, surgical options, and treatments and risks were discussed with patient. - discussed diagnosis and progression - not yet visually significant - monitor for now  Ophthalmic Meds Ordered this visit:  Meds ordered this encounter  Medications  . Bevacizumab (AVASTIN) SOLN 1.25 mg       Return in about 4 weeks (around 06/22/2018) for F/U Exu AMD OD, DFE, OCT.  There are no Patient Instructions on file for this  visit.   Explained the diagnoses, plan, and follow up with the patient and they expressed understanding.  Patient expressed understanding of the importance of proper follow up care.   This document serves as a record of services personally performed by Gardiner Sleeper, MD, PhD. It was created on their behalf by Ernest Mallick, OA, an ophthalmic assistant. The creation of this record is the provider's dictation and/or activities during the visit.    Electronically signed by: Ernest Mallick, OA  09.26.19 8:37 AM    Gardiner Sleeper, M.D., Ph.D. Diseases & Surgery of the Retina and Vitreous [facility]   I have reviewed the above documentation for accuracy and completeness, and I agree with the above. Gardiner Sleeper, M.D., Ph.D. 05/28/18 8:38 AM   Abbreviations: M myopia (nearsighted); A astigmatism; H hyperopia (farsighted); P presbyopia; Mrx spectacle prescription;  CTL contact lenses; OD right eye; OS left eye; OU both eyes  XT exotropia; ET esotropia; PEK punctate epithelial keratitis; PEE punctate epithelial erosions; DES dry eye syndrome; MGD meibomian gland dysfunction; ATs artificial tears; PFAT's preservative free artificial tears; [place] nuclear sclerotic cataract; PSC posterior subcapsular cataract; ERM epi-retinal membrane; PVD posterior vitreous detachment; RD retinal detachment; DM diabetes mellitus; DR diabetic retinopathy; NPDR non-proliferative diabetic retinopathy; PDR proliferative diabetic retinopathy; CSME clinically significant macular edema; DME diabetic macular edema; dbh dot blot hemorrhages; CWS cotton wool spot; POAG primary open angle glaucoma; C/D cup-to-disc ratio; HVF humphrey visual field; GVF goldmann visual field; OCT optical coherence tomography; IOP intraocular pressure; BRVO Branch retinal vein occlusion; CRVO central retinal vein occlusion; CRAO central retinal artery occlusion; BRAO branch retinal artery occlusion; RT retinal tear; SB scleral  buckle; PPV pars plana vitrectomy; VH Vitreous hemorrhage; PRP panretinal laser photocoagulation; IVK intravitreal kenalog; VMT vitreomacular traction; MH Macular hole;  NVD neovascularization of the disc; NVE neovascularization elsewhere; AREDS age related eye disease study; ARMD age related macular degeneration; POAG primary open angle glaucoma; EBMD epithelial/anterior basement membrane dystrophy; ACIOL anterior chamber intraocular lens; IOL intraocular lens; PCIOL posterior chamber intraocular lens; Phaco/IOL phacoemulsification with intraocular lens placement; [place] photorefractive keratectomy; LASIK laser assisted in situ keratomileusis; HTN hypertension; DM diabetes mellitus; COPD chronic obstructive pulmonary disease

## 2018-05-25 ENCOUNTER — Ambulatory Visit (INDEPENDENT_AMBULATORY_CARE_PROVIDER_SITE_OTHER): Payer: Medicare HMO | Admitting: Ophthalmology

## 2018-05-25 ENCOUNTER — Encounter (INDEPENDENT_AMBULATORY_CARE_PROVIDER_SITE_OTHER): Payer: Self-pay | Admitting: Ophthalmology

## 2018-05-25 DIAGNOSIS — H43811 Vitreous degeneration, right eye: Secondary | ICD-10-CM | POA: Diagnosis not present

## 2018-05-25 DIAGNOSIS — H35711 Central serous chorioretinopathy, right eye: Secondary | ICD-10-CM | POA: Diagnosis not present

## 2018-05-25 DIAGNOSIS — H353211 Exudative age-related macular degeneration, right eye, with active choroidal neovascularization: Secondary | ICD-10-CM | POA: Diagnosis not present

## 2018-05-25 DIAGNOSIS — I10 Essential (primary) hypertension: Secondary | ICD-10-CM | POA: Diagnosis not present

## 2018-05-25 DIAGNOSIS — H35033 Hypertensive retinopathy, bilateral: Secondary | ICD-10-CM | POA: Diagnosis not present

## 2018-05-25 DIAGNOSIS — H3581 Retinal edema: Secondary | ICD-10-CM

## 2018-05-25 DIAGNOSIS — H25813 Combined forms of age-related cataract, bilateral: Secondary | ICD-10-CM

## 2018-05-25 DIAGNOSIS — H35412 Lattice degeneration of retina, left eye: Secondary | ICD-10-CM | POA: Diagnosis not present

## 2018-05-28 ENCOUNTER — Encounter (INDEPENDENT_AMBULATORY_CARE_PROVIDER_SITE_OTHER): Payer: Self-pay | Admitting: Ophthalmology

## 2018-05-28 DIAGNOSIS — H25813 Combined forms of age-related cataract, bilateral: Secondary | ICD-10-CM | POA: Diagnosis not present

## 2018-05-28 DIAGNOSIS — H35033 Hypertensive retinopathy, bilateral: Secondary | ICD-10-CM | POA: Diagnosis not present

## 2018-05-28 DIAGNOSIS — H35412 Lattice degeneration of retina, left eye: Secondary | ICD-10-CM | POA: Diagnosis not present

## 2018-05-28 DIAGNOSIS — H353211 Exudative age-related macular degeneration, right eye, with active choroidal neovascularization: Secondary | ICD-10-CM | POA: Diagnosis not present

## 2018-05-28 DIAGNOSIS — I10 Essential (primary) hypertension: Secondary | ICD-10-CM | POA: Diagnosis not present

## 2018-05-28 DIAGNOSIS — H3581 Retinal edema: Secondary | ICD-10-CM | POA: Diagnosis not present

## 2018-05-28 DIAGNOSIS — H35711 Central serous chorioretinopathy, right eye: Secondary | ICD-10-CM | POA: Diagnosis not present

## 2018-05-28 DIAGNOSIS — H43811 Vitreous degeneration, right eye: Secondary | ICD-10-CM | POA: Diagnosis not present

## 2018-05-28 MED ORDER — BEVACIZUMAB CHEMO INJECTION 1.25MG/0.05ML SYRINGE FOR KALEIDOSCOPE
1.2500 mg | INTRAVITREAL | Status: AC
  Administered 2018-05-28: 1.25 mg via INTRAVITREAL

## 2018-05-30 DIAGNOSIS — Z299 Encounter for prophylactic measures, unspecified: Secondary | ICD-10-CM | POA: Diagnosis not present

## 2018-05-30 DIAGNOSIS — I10 Essential (primary) hypertension: Secondary | ICD-10-CM | POA: Diagnosis not present

## 2018-05-30 DIAGNOSIS — Z713 Dietary counseling and surveillance: Secondary | ICD-10-CM | POA: Diagnosis not present

## 2018-05-30 DIAGNOSIS — Z6832 Body mass index (BMI) 32.0-32.9, adult: Secondary | ICD-10-CM | POA: Diagnosis not present

## 2018-06-12 DIAGNOSIS — R69 Illness, unspecified: Secondary | ICD-10-CM | POA: Diagnosis not present

## 2018-06-21 NOTE — Progress Notes (Signed)
[facility] Note  06/22/2018     CHIEF COMPLAINT Patient presents for Retina Follow Up   HISTORY OF PRESENT ILLNESS: Darlene Crawford is a 77 y.o. female who presents to the clinic today for:   HPI    Retina Follow Up    Patient presents with  Wet AMD.  In right eye.  Severity is moderate.  Duration of 4 weeks.  Since onset it is gradually worsening.  I, the attending physician,  performed the HPI with the patient and updated documentation appropriately.          Comments    Patient states vision slightly worse OD. No eye pain/discomfort.        Last edited by Bernarda Caffey, MD on 06/22/2018 12:43 PM. (History)    pt states she does not feel like she got any improvement from the injection  Referring physician: Monico Blitz, MD [place], [place] [address]  HISTORICAL INFORMATION:   Selected notes from the MEDICAL RECORD NUMBER Referred by Dr. Leander Rams for concern of ARMD OD with progression to CNVM vs CSR;  LEE- 01.29.19 (R. Davis) [BCVA OD: 20/100 OS: 20/30] Ocular Hx- AMD OU, cataract OU, DES OU, HTN retinopathy OU, WWP OU, choroidal nevus OD;  PMH- elevated chol, HTN, hyperthyoridism    CURRENT MEDICATIONS: Current Outpatient Medications (Ophthalmic Drugs)  Medication Sig  . Artificial Tear Ointment (DRY EYES OP) Apply to eye.   No current facility-administered medications for this visit.  (Ophthalmic Drugs)   Current Outpatient Medications (Other)  Medication Sig  . atorvastatin (LIPITOR) 10 MG tablet   . b complex vitamins capsule Take 1 capsule by mouth daily.  . calcium citrate-vitamin D (CITRACAL+D) 315-200 MG-UNIT tablet Take 1 tablet by mouth 2 (two) times daily.  Marland Kitchen dextromethorphan-guaiFENesin (MUCINEX DM) 30-600 MG 12hr tablet Take 1 tablet by mouth 2 (two) times daily.  . diclofenac (VOLTAREN) 50 MG EC tablet   . eplerenone (INSPRA) 25 MG tablet Take 1 tablet (25 mg total) by mouth daily.  . fluticasone (FLONASE) 50  MCG/ACT nasal spray Place into both nostrils daily.  . hydrochlorothiazide (HYDRODIURIL) 25 MG tablet   . levothyroxine (SYNTHROID, LEVOTHROID) 100 MCG tablet Take 100 mcg by mouth daily before breakfast.  . loratadine (CLARITIN) 10 MG tablet Take 10 mg by mouth daily.  Marland Kitchen losartan (COZAAR) 100 MG tablet   . Melatonin-Pyridoxine (MELATIN PO) Take by mouth.  . Multiple Vitamins-Minerals (PRESERVISION AREDS 2 PO) Take by mouth.  . Omega-3 Fatty Acids (EQL FISH OIL PO) Take by mouth.  . pseudoephedrine-guaifenesin (TUSSIN PE) 30-100 MG/5ML SYRP Take 5 mLs by mouth every 4 (four) hours as needed.  . sertraline (ZOLOFT) 50 MG tablet Take 50 mg by mouth daily.   Current Facility-Administered Medications (Other)  Medication Route  . Bevacizumab (AVASTIN) SOLN 1.25 mg Intravitreal  . Bevacizumab (AVASTIN) SOLN 1.25 mg Intravitreal      REVIEW OF SYSTEMS: ROS    Positive for: Musculoskeletal, Cardiovascular, Eyes   Negative for: Constitutional, Gastrointestinal, Neurological, Skin, Genitourinary, HENT, Endocrine, Respiratory, Psychiatric, Allergic/Imm, Heme/Lymph   Last edited by Roselee Nova D on 06/22/2018 12:12 PM. (History)       ALLERGIES No Known Allergies  PAST MEDICAL HISTORY Past Medical History:  Diagnosis Date  . Hypertension   . Osteoarthritis    Past Surgical History:  Procedure Laterality Date  . TUBAL LIGATION    . UTERINE FIBROID SURGERY      FAMILY HISTORY  Family History  Problem Relation Age of Onset  . Cancer Mother        lung  . Stroke Father   . Stroke Brother     SOCIAL HISTORY Social History   Tobacco Use  . Smoking status: Never Smoker  . Smokeless tobacco: Never Used  Substance Use Topics  . Alcohol use: No    Frequency: Never  . Drug use: No         OPHTHALMIC EXAM:  Base Eye Exam    Visual Acuity (Snellen - Linear)      Right Left   Dist cc 20/40 -2 20/20 -1   Dist ph cc NI    Correction:  Glasses       Tonometry  (Tonopen, 12:18 PM)      Right Left   Pressure 13 14       Pupils      Dark Light Shape React APD   Right 4 2 Round Brisk None   Left 4 2 Round Brisk None       Visual Fields (Counting fingers)      Left Right    Full Full       Extraocular Movement      Right Left    Full, Ortho Full, Ortho       Neuro/Psych    Oriented x3:  Yes   Mood/Affect:  Normal       Dilation    Both eyes:  1.0% Mydriacyl, 2.5% Phenylephrine @ 12:18 PM        Slit Lamp and Fundus Exam    Slit Lamp Exam      Right Left   Lids/Lashes Dermatochalasis - upper lid Dermatochalasis - upper lid   Conjunctiva/Sclera White and quiet White and quiet   Cornea Arcus, early nasal band K Arcus, early nasal band K   Anterior Chamber moderate depth, with narrow angle temporally moderate depth, with narrow angle temporally   Iris Round and well dilated Round and well dilated   Lens 2+ Nuclear sclerosis, 2+ Cortical cataract 2+ Nuclear sclerosis, 2+ Cortical cataract   Vitreous Vitreous syneresis, Posterior vitreous detachment, Weiss ring Vitreous syneresis       Fundus Exam      Right Left   Disc Pink and Sharp Pink and Sharp, Tilted disc   C/D Ratio 0.3 0.4   Macula Blunted foveal reflex, Drusen, RPE mottling and clumping, central SRF and edema--no significant interval change, no heme Flat, Retinal pigment epithelial mottling, superior para-foveal focal area of RPE atrophy -- stable, rare drusen, No heme or edema   Vessels Copper wiring, AV crossing changes, Tortuous, Vascular attenuation Copper wiring, AV crossing changes, mildly Tortuous   Periphery Attached, peripheral drusen / RPE changes nasally, peripheral cystoid degeneration Patch of Lattice degeneration at 0600, scattered peripheral drusen, peripheral cystoid degeneration        Refraction    Wearing Rx      Sphere Cylinder Axis Add   Right +2.00 +1.25 158 +2.50   Left +2.00 +0.75 002 +2.50   Type:  PAL       Manifest Refraction       Sphere Cylinder Axis Dist VA   Right +2.00 +1.75 160 20/40-2   Left              IMAGING AND PROCEDURES  Imaging and Procedures for 12/11/17  OCT, Retina - OU - Both Eyes       Right Eye Quality was good. Central Foveal  Thickness: 483. Progression has been stable. Findings include subretinal fluid, no IRF, retinal drusen , pigment epithelial detachment, abnormal foveal contour (No significant interval change).   Left Eye Quality was good. Central Foveal Thickness: 277. Progression has been stable. Findings include normal foveal contour, no IRF, no SRF, retinal drusen .   Notes *Images captured and stored on drive  Diagnosis / Impression:  OD: persistent SRF -- No significant interval change OS: NFP, No IRF/SRF  Clinical management:  See below  Abbreviations: NFP - Normal foveal profile. CME - cystoid macular edema. PED - pigment epithelial detachment. IRF - intraretinal fluid. SRF - subretinal fluid. EZ - ellipsoid zone. ERM - epiretinal membrane. ORA - outer retinal atrophy. ORT - outer retinal tubulation. SRHM - subretinal hyper-reflective material         Intravitreal Injection, Pharmacologic Agent - OD - Right Eye       Time Out 06/22/2018. 12:55 PM. Confirmed correct patient, procedure, site, and patient consented.   Anesthesia Topical anesthesia was used. Anesthetic medications included Lidocaine 2%, Proparacaine 0.5%.   Procedure Preparation included 5% betadine to ocular surface, eyelid speculum. A 30 gauge needle was used.   Injection:  1.25 mg Bevacizumab 1.25mg /0.80ml   NDC: 50242-060-01, Lot: [id number] , Expiration date: 08/09/2018   Route: Intravitreal, Site: Right Eye, Waste: 0 mg  Post-op Post injection exam found visual acuity of at least counting fingers. The patient tolerated the procedure well. There were no complications. The patient received written and verbal post procedure care education.                 ASSESSMENT/PLAN:     ICD-10-CM   1. Exudative age-related macular degeneration of right eye with active choroidal neovascularization (HCC) H35.3211 Intravitreal Injection, Pharmacologic Agent - OD - Right Eye    Bevacizumab (AVASTIN) SOLN 1.25 mg  2. 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 Retinal edema H35.81 OCT, Retina - OU - Both Eyes  4. Essential hypertension I10   5. Hypertensive retinopathy of both eyes H35.033   6. 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 Posterior vitreous detachment of right eye H43.811   8. 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 Exudative age related macular degeneration vs CSCR OD    - repeat FA (03.02.19) without leakage / pooling into area of SRF  - today, VA 20/40 OD from 20/30, pt reports no subjective change in vision -- still with central blur  - OCT today with central SRF unchanged from prior  - review of systems reveals significant stressors in life, but no exogenous steroid use  - discussed treatment with anti-VEGF vs 25mg  of Eplerenone  - pt unable to afford Eplerenone   - S/P IVA #1 OD (09.27.19)  - recommend IVA OD #2 today, 10.25.19  - pt wishes to proceed  - RBA of procedure discussed, questions answered  - informed consent obtained and signed  - see procedure note  - f/u 4 weeks  4,5. Hypertensive retinopathy OU - discussed importance of tight BP control - stable - monitor  6. Lattice degeneration OS- - patch of inferior lattice OS -- no RT, holes or SRF - discussed findings, prognosis, and treatment options including observation - monitor  7. PVD / vitreous syneresis OD-   Discussed findings and prognosis  No RT or RD on 360 peripheral exam  Reviewed s/s of RT/RD  Strict return precautions for any such RT/RD signs/symptoms  8. Combined form age-related cataract OU-  - The symptoms of cataract, surgical options, and treatments and risks were discussed with patient. - discussed diagnosis and  progression - not yet visually significant - monitor for now  Ophthalmic Meds Ordered this visit:  Meds ordered this encounter  Medications  . Bevacizumab (AVASTIN) SOLN 1.25 mg       Return in about 4 weeks (around 07/20/2018) for Exu ARMD, DFE, OCT, injection.  There are no Patient Instructions on file for this visit.   Explained the diagnoses, plan, and follow up with the patient and they expressed understanding.  Patient expressed understanding of the importance of proper follow up care.   This document serves as a record of services personally performed by Gardiner Sleeper, MD, PhD. It was created on their behalf by Ernest Mallick, OA, an ophthalmic assistant. The creation of this record is the provider's dictation and/or activities during the visit.    Electronically signed by: Ernest Mallick, OA  10.24.19 11:38 PM    Gardiner Sleeper, M.D., Ph.D. Diseases & Surgery of the Retina and Vitreous [facility]   I have reviewed the above documentation for accuracy and completeness, and I agree with the above. Gardiner Sleeper, M.D., Ph.D. 06/24/18 11:39 PM   Abbreviations: M myopia (nearsighted); A astigmatism; H hyperopia (farsighted); P presbyopia; Mrx spectacle prescription;  CTL contact lenses; OD right eye; OS left eye; OU both eyes  XT exotropia; ET esotropia; PEK punctate epithelial keratitis; PEE punctate epithelial erosions; DES dry eye syndrome; MGD meibomian gland dysfunction; ATs artificial tears; PFAT's preservative free artificial tears; [place] nuclear sclerotic cataract; PSC posterior subcapsular cataract; ERM epi-retinal membrane; PVD posterior vitreous detachment; RD retinal detachment; DM diabetes mellitus; DR diabetic retinopathy; NPDR non-proliferative diabetic retinopathy; PDR proliferative diabetic retinopathy; CSME clinically significant macular edema; DME diabetic macular edema; dbh dot blot hemorrhages; CWS cotton wool spot; POAG primary open  angle glaucoma; C/D cup-to-disc ratio; HVF humphrey visual field; GVF goldmann visual field; OCT optical coherence tomography; IOP intraocular pressure; BRVO Branch retinal vein occlusion; CRVO central retinal vein occlusion; CRAO central retinal artery occlusion; BRAO branch retinal artery occlusion; RT retinal tear; SB scleral buckle; PPV pars plana vitrectomy; VH Vitreous hemorrhage; PRP panretinal laser photocoagulation; IVK intravitreal kenalog; VMT vitreomacular traction; MH Macular hole;  NVD neovascularization of the disc; NVE neovascularization elsewhere; AREDS age related eye disease study; ARMD age related macular degeneration; POAG primary open angle glaucoma; EBMD epithelial/anterior basement membrane dystrophy; ACIOL anterior chamber intraocular lens; IOL intraocular lens; PCIOL posterior chamber intraocular lens; Phaco/IOL phacoemulsification with intraocular lens placement; [place] photorefractive keratectomy; LASIK laser assisted in situ keratomileusis; HTN hypertension; DM diabetes mellitus; COPD chronic obstructive pulmonary disease

## 2018-06-22 ENCOUNTER — Ambulatory Visit (INDEPENDENT_AMBULATORY_CARE_PROVIDER_SITE_OTHER): Payer: Medicare HMO | Admitting: Ophthalmology

## 2018-06-22 ENCOUNTER — Encounter (INDEPENDENT_AMBULATORY_CARE_PROVIDER_SITE_OTHER): Payer: Self-pay | Admitting: Ophthalmology

## 2018-06-22 DIAGNOSIS — H35033 Hypertensive retinopathy, bilateral: Secondary | ICD-10-CM

## 2018-06-22 DIAGNOSIS — H43811 Vitreous degeneration, right eye: Secondary | ICD-10-CM

## 2018-06-22 DIAGNOSIS — H353211 Exudative age-related macular degeneration, right eye, with active choroidal neovascularization: Secondary | ICD-10-CM

## 2018-06-22 DIAGNOSIS — H35711 Central serous chorioretinopathy, right eye: Secondary | ICD-10-CM

## 2018-06-22 DIAGNOSIS — I10 Essential (primary) hypertension: Secondary | ICD-10-CM

## 2018-06-22 DIAGNOSIS — H25813 Combined forms of age-related cataract, bilateral: Secondary | ICD-10-CM

## 2018-06-22 DIAGNOSIS — H35412 Lattice degeneration of retina, left eye: Secondary | ICD-10-CM

## 2018-06-22 DIAGNOSIS — H3581 Retinal edema: Secondary | ICD-10-CM | POA: Diagnosis not present

## 2018-06-24 ENCOUNTER — Encounter (INDEPENDENT_AMBULATORY_CARE_PROVIDER_SITE_OTHER): Payer: Self-pay | Admitting: Ophthalmology

## 2018-06-24 DIAGNOSIS — H353211 Exudative age-related macular degeneration, right eye, with active choroidal neovascularization: Secondary | ICD-10-CM | POA: Diagnosis not present

## 2018-06-24 DIAGNOSIS — H3581 Retinal edema: Secondary | ICD-10-CM | POA: Diagnosis not present

## 2018-06-24 MED ORDER — BEVACIZUMAB CHEMO INJECTION 1.25MG/0.05ML SYRINGE FOR KALEIDOSCOPE
1.2500 mg | INTRAVITREAL | Status: AC
  Administered 2018-06-24: 1.25 mg via INTRAVITREAL

## 2018-07-19 NOTE — Progress Notes (Signed)
[facility] Note  07/20/2018     CHIEF COMPLAINT Patient presents for Retina Follow Up   HISTORY OF PRESENT ILLNESS: Darlene Crawford is a 77 y.o. female who presents to the clinic today for:   HPI    Retina Follow Up    Patient presents with  Wet AMD.  In right eye.  This started months ago.  Severity is moderate.  Duration of months.  Since onset it is stable.  I, the attending physician,  performed the HPI with the patient and updated documentation appropriately.          Comments    77 y/o female pt here for 4 wk f/u for wet ARMD OD w/CSCR.  No change in [place] OU.  Denies pain, flashes, but is noticing more floaters OD since last injection.  Rewetting gtts prn OU.       Last edited by Bernarda Caffey, MD on 07/20/2018  1:05 PM. (History)    pt states vision is still the same  Referring physician: Monico Blitz, MD [place], [place] [id number]  HISTORICAL INFORMATION:   Selected notes from the MEDICAL RECORD NUMBER Referred by Dr. Leander Rams for concern of ARMD OD with progression to CNVM vs CSR;  LEE- 01.29.19 (R. Davis) [BCVA OD: 20/100 OS: 20/30] Ocular Hx- AMD OU, cataract OU, DES OU, HTN retinopathy OU, WWP OU, choroidal nevus OD;  PMH- elevated chol, HTN, hyperthyoridism    CURRENT MEDICATIONS: Current Outpatient Medications (Ophthalmic Drugs)  Medication Sig  . Artificial Tear Ointment (DRY EYES OP) Apply to eye.   No current facility-administered medications for this visit.  (Ophthalmic Drugs)   Current Outpatient Medications (Other)  Medication Sig  . atorvastatin (LIPITOR) 10 MG tablet   . b complex vitamins capsule Take 1 capsule by mouth daily.  . calcium citrate-vitamin D (CITRACAL+D) 315-200 MG-UNIT tablet Take 1 tablet by mouth 2 (two) times daily.  Marland Kitchen dextromethorphan-guaiFENesin (MUCINEX DM) 30-600 MG 12hr tablet Take 1 tablet by mouth 2 (two) times daily.  . diclofenac (VOLTAREN) 50 MG EC tablet   . eplerenone (INSPRA)  25 MG tablet Take 1 tablet (25 mg total) by mouth daily.  . fluticasone (FLONASE) 50 MCG/ACT nasal spray Place into both nostrils daily.  . hydrochlorothiazide (HYDRODIURIL) 25 MG tablet   . levothyroxine (SYNTHROID, LEVOTHROID) 100 MCG tablet Take 100 mcg by mouth daily before breakfast.  . loratadine (CLARITIN) 10 MG tablet Take 10 mg by mouth daily.  Marland Kitchen losartan (COZAAR) 100 MG tablet   . Melatonin-Pyridoxine (MELATIN PO) Take by mouth.  . Multiple Vitamins-Minerals (PRESERVISION AREDS 2 PO) Take by mouth.  . Omega-3 Fatty Acids (EQL FISH OIL PO) Take by mouth.  . pseudoephedrine-guaifenesin (TUSSIN PE) 30-100 MG/5ML SYRP Take 5 mLs by mouth every 4 (four) hours as needed.  . sertraline (ZOLOFT) 50 MG tablet Take 50 mg by mouth daily.   Current Facility-Administered Medications (Other)  Medication Route  . Bevacizumab (AVASTIN) SOLN 1.25 mg Intravitreal  . Bevacizumab (AVASTIN) SOLN 1.25 mg Intravitreal  . Bevacizumab (AVASTIN) SOLN 1.25 mg Intravitreal      REVIEW OF SYSTEMS: ROS    Positive for: Eyes   Negative for: Constitutional, Gastrointestinal, Neurological, Skin, Genitourinary, Musculoskeletal, HENT, Endocrine, Cardiovascular, Respiratory, Psychiatric, Allergic/Imm, Heme/Lymph   Last edited by Matthew Folks, COA on 07/20/2018 12:55 PM. (History)       ALLERGIES No Known Allergies  PAST MEDICAL HISTORY Past Medical History:  Diagnosis Date  .  Hypertension   . Osteoarthritis    Past Surgical History:  Procedure Laterality Date  . TUBAL LIGATION    . UTERINE FIBROID SURGERY      FAMILY HISTORY Family History  Problem Relation Age of Onset  . Cancer Mother        lung  . Stroke Father   . Stroke Brother     SOCIAL HISTORY Social History   Tobacco Use  . Smoking status: Never Smoker  . Smokeless tobacco: Never Used  Substance Use Topics  . Alcohol use: No    Frequency: Never  . Drug use: No         OPHTHALMIC EXAM:  Base Eye Exam     Visual Acuity (Snellen - Linear)      Right Left   Dist cc 20/40 -2 20/20   Dist ph cc NI    Correction:  Glasses       Tonometry (Tonopen, 1:00 PM)      Right Left   Pressure 12 10       Pupils      Dark Light Shape React APD   Right 4 2 Round Brisk None   Left 4 2 Round Brisk None       Visual Fields (Counting fingers)      Left Right    Full Full       Extraocular Movement      Right Left    Full, Ortho Full, Ortho       Neuro/Psych    Oriented x3:  Yes   Mood/Affect:  Normal       Dilation    Both eyes:  1.0% Mydriacyl, 2.5% Phenylephrine @ 1:00 PM        Slit Lamp and Fundus Exam    Slit Lamp Exam      Right Left   Lids/Lashes Dermatochalasis - upper lid Dermatochalasis - upper lid   Conjunctiva/Sclera White and quiet White and quiet   Cornea Arcus, early nasal band K Arcus, early nasal band K   Anterior Chamber moderate depth, with narrow angle temporally moderate depth, with narrow angle temporally   Iris Round and well dilated Round and well dilated   Lens 2+ Nuclear sclerosis, 2+ Cortical cataract 2+ Nuclear sclerosis, 2+ Cortical cataract   Vitreous Vitreous syneresis, Posterior vitreous detachment, Weiss ring Vitreous syneresis       Fundus Exam      Right Left   Disc Pink and Sharp Pink and Sharp, Tilted disc   C/D Ratio 0.3 0.4   Macula Blunted foveal reflex, Drusen, RPE mottling and clumping, central SRF and edema--no significant interval change, no heme Flat, Retinal pigment epithelial mottling, superior para-foveal focal area of RPE atrophy -- stable, rare drusen, No heme or edema   Vessels Copper wiring, AV crossing changes, Tortuous, Vascular attenuation Copper wiring, AV crossing changes, mildly Tortuous   Periphery Attached, peripheral drusen / RPE changes nasally, peripheral cystoid degeneration Patch of Lattice degeneration at 0600, scattered peripheral drusen, peripheral cystoid degeneration          IMAGING AND PROCEDURES  Imaging  and Procedures for 12/11/17  OCT, Retina - OU - Both Eyes       Right Eye Quality was good. Central Foveal Thickness: 471. Progression has been stable. Findings include subretinal fluid, no IRF, retinal drusen , pigment epithelial detachment, abnormal foveal contour (No significant interval change).   Left Eye Quality was good. Central Foveal Thickness: 279. Progression has been stable. Findings  include normal foveal contour, no IRF, no SRF, retinal drusen .   Notes *Images captured and stored on drive  Diagnosis / Impression:  OD: persistent SRF -- No significant interval change OS: NFP, No IRF/SRF  Clinical management:  See below  Abbreviations: NFP - Normal foveal profile. CME - cystoid macular edema. PED - pigment epithelial detachment. IRF - intraretinal fluid. SRF - subretinal fluid. EZ - ellipsoid zone. ERM - epiretinal membrane. ORA - outer retinal atrophy. ORT - outer retinal tubulation. SRHM - subretinal hyper-reflective material         Intravitreal Injection, Pharmacologic Agent - OD - Right Eye       Time Out 07/20/2018. 1:24 PM. Confirmed correct patient, procedure, site, and patient consented.   Anesthesia Topical anesthesia was used. Anesthetic medications included Lidocaine 2%, Proparacaine 0.5%.   Procedure Preparation included 5% betadine to ocular surface, eyelid speculum. A supplied needle was used.   Injection:  1.25 mg Bevacizumab 1.25mg /0.34ml   NDC: 50242-060-01, Lot: [id number] , Expiration date: 09/27/2018   Route: Intravitreal, Site: Right Eye, Waste: 0 mg  Post-op Post injection exam found visual acuity of at least counting fingers. The patient tolerated the procedure well. There were no complications. The patient received written and verbal post procedure care education.                 ASSESSMENT/PLAN:    ICD-10-CM   1. Exudative age-related macular degeneration of right eye with active choroidal neovascularization (HCC)  H35.3211 Intravitreal Injection, Pharmacologic Agent - OD - Right Eye    Bevacizumab (AVASTIN) SOLN 1.25 mg  2. 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 Retinal edema H35.81 OCT, Retina - OU - Both Eyes  4. Essential hypertension I10   5. Hypertensive retinopathy of both eyes H35.033   6. 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 Posterior vitreous detachment of right eye H43.811   8. 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 Exudative age related macular degeneration vs CSCR OD    - repeat FA (03.02.19) without leakage / pooling into area of SRF  - today, VA 20/40 OD from 20/30, pt reports no subjective change in vision -- still with central blur  - OCT today with central SRF unchanged from prior  - review of systems reveals significant stressors in life, but no exogenous steroid use  - discussed treatment with anti-VEGF vs 25mg  of Eplerenone  - pt unable to afford Eplerenone   - S/P IVA #1 OD (09.27.19),  #2 (10.25.19)  - recommend IVA OD #3 today, 11.22.19  - pt wishes to proceed  - RBA of procedure discussed, questions answered  - informed consent obtained and signed  - see procedure note  - discussed possibility of switching therapy if SRF remains unresponsive to IVA  - [employer] benefits investigation initiated, 11.22.19  - f/u 4 weeks  4,5. Hypertensive retinopathy OU - discussed importance of tight BP control - stable - monitor  6. Lattice degeneration OS- - patch of inferior lattice OS -- no RT, holes or SRF - discussed findings, prognosis, and treatment options including observation - monitor  7. PVD / vitreous syneresis OD-   Discussed findings and prognosis  No RT or RD on 360 peripheral exam  Reviewed s/s of RT/RD  Strict return precautions for any such RT/RD signs/symptoms    8. Combined form age-related cataract OU-  - The symptoms of cataract, surgical options, and treatments and  risks were discussed with  patient. - discussed diagnosis and progression - not yet visually significant - monitor for now  Ophthalmic Meds Ordered this visit:  Meds ordered this encounter  Medications  . Bevacizumab (AVASTIN) SOLN 1.25 mg       Return in about 4 weeks (around 08/17/2018) for Dilated Exam, OCT, Possible Injxn.  There are no Patient Instructions on file for this visit.   Explained the diagnoses, plan, and follow up with the patient and they expressed understanding.  Patient expressed understanding of the importance of proper follow up care.   This document serves as a record of services personally performed by Gardiner Sleeper, MD, PhD. It was created on their behalf by Ernest Mallick, OA, an ophthalmic assistant. The creation of this record is the provider's dictation and/or activities during the visit.    Electronically signed by: Ernest Mallick, OA  11.21.19 1:37 PM .   Gardiner Sleeper, M.D., Ph.D. Diseases & Surgery of the Retina and Vitreous [facility]   I have reviewed the above documentation for accuracy and completeness, and I agree with the above. Gardiner Sleeper, M.D., Ph.D. 07/20/18 1:37 PM    Abbreviations: M myopia (nearsighted); A astigmatism; H hyperopia (farsighted); P presbyopia; Mrx spectacle prescription;  CTL contact lenses; OD right eye; OS left eye; OU both eyes  XT exotropia; ET esotropia; PEK punctate epithelial keratitis; PEE punctate epithelial erosions; DES dry eye syndrome; MGD meibomian gland dysfunction; ATs artificial tears; PFAT's preservative free artificial tears; [place] nuclear sclerotic cataract; PSC posterior subcapsular cataract; ERM epi-retinal membrane; PVD posterior vitreous detachment; RD retinal detachment; DM diabetes mellitus; DR diabetic retinopathy; NPDR non-proliferative diabetic retinopathy; PDR proliferative diabetic retinopathy; CSME clinically significant macular edema; DME diabetic macular edema; dbh dot blot hemorrhages;  CWS cotton wool spot; POAG primary open angle glaucoma; C/D cup-to-disc ratio; HVF humphrey visual field; GVF goldmann visual field; OCT optical coherence tomography; IOP intraocular pressure; BRVO Branch retinal vein occlusion; CRVO central retinal vein occlusion; CRAO central retinal artery occlusion; BRAO branch retinal artery occlusion; RT retinal tear; SB scleral buckle; PPV pars plana vitrectomy; VH Vitreous hemorrhage; PRP panretinal laser photocoagulation; IVK intravitreal kenalog; VMT vitreomacular traction; MH Macular hole;  NVD neovascularization of the disc; NVE neovascularization elsewhere; AREDS age related eye disease study; ARMD age related macular degeneration; POAG primary open angle glaucoma; EBMD epithelial/anterior basement membrane dystrophy; ACIOL anterior chamber intraocular lens; IOL intraocular lens; PCIOL posterior chamber intraocular lens; Phaco/IOL phacoemulsification with intraocular lens placement; [place] photorefractive keratectomy; LASIK laser assisted in situ keratomileusis; HTN hypertension; DM diabetes mellitus; COPD chronic obstructive pulmonary disease

## 2018-07-20 ENCOUNTER — Encounter (INDEPENDENT_AMBULATORY_CARE_PROVIDER_SITE_OTHER): Payer: Self-pay | Admitting: Ophthalmology

## 2018-07-20 ENCOUNTER — Ambulatory Visit (INDEPENDENT_AMBULATORY_CARE_PROVIDER_SITE_OTHER): Payer: Medicare HMO | Admitting: Ophthalmology

## 2018-07-20 DIAGNOSIS — H35711 Central serous chorioretinopathy, right eye: Secondary | ICD-10-CM

## 2018-07-20 DIAGNOSIS — H353211 Exudative age-related macular degeneration, right eye, with active choroidal neovascularization: Secondary | ICD-10-CM

## 2018-07-20 DIAGNOSIS — H3581 Retinal edema: Secondary | ICD-10-CM

## 2018-07-20 DIAGNOSIS — H43811 Vitreous degeneration, right eye: Secondary | ICD-10-CM

## 2018-07-20 DIAGNOSIS — H25813 Combined forms of age-related cataract, bilateral: Secondary | ICD-10-CM

## 2018-07-20 DIAGNOSIS — I10 Essential (primary) hypertension: Secondary | ICD-10-CM

## 2018-07-20 DIAGNOSIS — H35033 Hypertensive retinopathy, bilateral: Secondary | ICD-10-CM

## 2018-07-20 DIAGNOSIS — H35412 Lattice degeneration of retina, left eye: Secondary | ICD-10-CM

## 2018-07-20 MED ORDER — BEVACIZUMAB CHEMO INJECTION 1.25MG/0.05ML SYRINGE FOR KALEIDOSCOPE
1.2500 mg | INTRAVITREAL | Status: AC
  Administered 2018-07-20: 1.25 mg via INTRAVITREAL

## 2018-07-25 ENCOUNTER — Other Ambulatory Visit: Payer: Self-pay

## 2018-07-25 ENCOUNTER — Ambulatory Visit: Payer: Medicare HMO | Admitting: Nurse Practitioner

## 2018-07-25 ENCOUNTER — Encounter: Payer: Self-pay | Admitting: Nurse Practitioner

## 2018-07-25 DIAGNOSIS — R195 Other fecal abnormalities: Secondary | ICD-10-CM

## 2018-07-25 MED ORDER — PEG 3350-KCL-NA BICARB-NACL 420 G PO SOLR: 4000.0000 mL | ORAL | 0 refills | Status: DC

## 2018-07-25 NOTE — Assessment & Plan Note (Signed)
The patient was referred to our office for positive Cologuard testing.  She has not had a colonoscopy in 25 years.  She is generally asymptomatic from a GI standpoint.  Given positive Cologuard we will proceed with colonoscopy at this time.  Return for follow-up based on post procedure recommendations.  Proceed with colonoscopy with Dr. Oneida Alar in the near future. The risks, benefits, and alternatives have been discussed in detail with the patient. They state understanding and desire to proceed.   The patient is currently on Zoloft which is residual from the passing of her husband and associated stress/anxiety.  No other anticoagulants, anxiolytics, chronic pain medications, or antidepressants.  Conscious sedation should be adequate for procedure.

## 2018-07-25 NOTE — Progress Notes (Addendum)
REVIEWED-NO ADDITIONAL RECOMMENDATIONS.  Primary Care Physician:  Monico Blitz, MD Primary Gastroenterologist:  Dr. Oneida Alar  Chief Complaint  Patient presents with  . positive cologuard    Patient had ate tomatoes when she took test. Had TCS 25 yrs ago. No FH colon CA.    HPI:   Darlene Crawford is a 77 y.o. female who presents on referral from primary care for heme positive stool.  Reviewed information provided with referral including Cologuard test result which was positive on 03/19/2018.  Also reviewed office visit dated 04/10/2018 when the patient presented to discuss Cologuard.  Denied personal history of colon cancer and denied family history of colon cancer.  Recommended referral to GI for colonoscopy consideration..   No history of colonoscopy found in our system.  Today she states she's doing ok overall. She had a colonoscopy 25 years ago. Had a recent positive Cologuard. Denies abdominal pain, N/V, hematochezia, melena, fever, chills, unintentional weight loss. Has chronic hemorrhoids which she is able to self manage. Intermittent constipation, will use OTC treatments as needed. Denies chest pain, dyspnea, dizziness, lightheadedness, syncope, near syncope. Denies any other upper or lower GI symptoms.  Past Medical History:  Diagnosis Date  . Anxiety and depression   . Hypercholesterolemia   . Hypertension   . Hypothyroidism   . Osteoarthritis   . Seasonal allergies     Past Surgical History:  Procedure Laterality Date  . TUBAL LIGATION    . UTERINE FIBROID SURGERY      Current Outpatient Medications  Medication Sig Dispense Refill  . acidophilus (RISAQUAD) CAPS capsule Take 1 capsule by mouth daily.    . Artificial Tear Ointment (DRY EYES OP) Apply to eye.    Marland Kitchen atorvastatin (LIPITOR) 10 MG tablet     . Cholecalciferol (VITAMIN D) 50 MCG (2000 UT) tablet Take 4,000 Units by mouth daily.    . hydrochlorothiazide (HYDRODIURIL) 25 MG tablet Take 25 mg by mouth daily.       Marland Kitchen levothyroxine (SYNTHROID, LEVOTHROID) 100 MCG tablet Take 100 mcg by mouth daily before breakfast.    . losartan (COZAAR) 100 MG tablet Take 100 mg by mouth daily.     . Melatonin-Pyridoxine (MELATIN PO) Take by mouth.    . Multiple Vitamins-Minerals (PRESERVISION AREDS 2 PO) Take by mouth.    . sertraline (ZOLOFT) 50 MG tablet Take 50 mg by mouth daily.    Marland Kitchen loratadine (CLARITIN) 10 MG tablet Take 10 mg by mouth daily.     Current Facility-Administered Medications  Medication Dose Route Frequency Provider Last Rate Last Dose  . Bevacizumab (AVASTIN) SOLN 1.25 mg  1.25 mg Intravitreal  Bernarda Caffey, MD   1.25 mg at 05/28/18 0837  . Bevacizumab (AVASTIN) SOLN 1.25 mg  1.25 mg Intravitreal  Bernarda Caffey, MD   1.25 mg at 06/24/18 2336  . Bevacizumab (AVASTIN) SOLN 1.25 mg  1.25 mg Intravitreal  Bernarda Caffey, MD   1.25 mg at 07/20/18 1324    Allergies as of 07/25/2018 - Review Complete 07/25/2018  Allergen Reaction Noted  . Vicodin [hydrocodone-acetaminophen]  07/25/2018    Family History  Problem Relation Age of Onset  . Cancer Mother        lung  . Stroke Father   . Stroke Brother   . Colon cancer Neg Hx     Social History   Socioeconomic History  . Marital status: Married    Spouse name: Not on file  . Number of children: Not on file  .  Years of education: Not on file  . Highest education level: Not on file  Occupational History  . Not on file  Social Needs  . Financial resource strain: Not on file  . Food insecurity:    Worry: Not on file    Inability: Not on file  . Transportation needs:    Medical: Not on file    Non-medical: Not on file  Tobacco Use  . Smoking status: Former Smoker    Types: Cigarettes  . Smokeless tobacco: Never Used  Substance and Sexual Activity  . Alcohol use: No    Frequency: Never  . Drug use: No  . Sexual activity: Not on file  Lifestyle  . Physical activity:    Days per week: Not on file    Minutes per session: Not on file   . Stress: Not on file  Relationships  . Social connections:    Talks on phone: Not on file    Gets together: Not on file    Attends religious service: Not on file    Active member of club or organization: Not on file    Attends meetings of clubs or organizations: Not on file    Relationship status: Not on file  . Intimate partner violence:    Fear of current or ex partner: Not on file    Emotionally abused: Not on file    Physically abused: Not on file    Forced sexual activity: Not on file  Other Topics Concern  . Not on file  Social History Narrative  . Not on file    Review of Systems: Complete ROS negative except as per HPI.    Physical Exam: BP 132/78   Pulse 75   Temp (!) 97 F (36.1 C) (Oral)   Ht 5\' 6"  (1.676 m)   Wt 200 lb 9.6 oz (91 kg)   BMI 32.38 kg/m  General:   Alert and oriented. Pleasant and cooperative. Well-nourished and well-developed.  Eyes:  Without icterus, sclera clear and conjunctiva pink.  Ears:  Normal auditory acuity. Cardiovascular:  S1, S2 present without murmurs appreciated. Extremities without clubbing or edema. Respiratory:  Clear to auscultation bilaterally. No wheezes, rales, or rhonchi. No distress.  Gastrointestinal:  +BS, soft, non-tender and non-distended. No HSM noted. No guarding or rebound. No masses appreciated.  Rectal:  Deferred  Musculoskalatal:  Symmetrical without gross deformities. Neurologic:  Alert and oriented x4;  grossly normal neurologically. Psych:  Alert and cooperative. Normal mood and affect. Heme/Lymph/Immune: No excessive bruising noted.    07/25/2018 2:25 PM   Disclaimer: This note was dictated with voice recognition software. Similar sounding words can inadvertently be transcribed and may not be corrected upon review.

## 2018-07-25 NOTE — Patient Instructions (Signed)
1. We will schedule your colonoscopy for you. 2. Further recommendations will be made after your colonoscopy. 3. Return for follow-up based on post procedure recommendations. 4. Call us if you have any questions or concerns.  At [facility] we value your feedback. You may receive a survey about your visit today. Please share your experience as we strive to create trusting relationships with our patients to provide genuine, compassionate, quality care.  We appreciate your understanding and patience as we review any laboratory studies, imaging, and other diagnostic tests that are ordered as we care for you. Our office policy is 5 business days for review of these results, and any emergent or urgent results are addressed in a timely manner for your best interest. If you do not hear from our office in 1 week, please contact us.   We also encourage the use of MyChart, which contains your medical information for your review as well. If you are not enrolled in this feature, an access code is on this after visit summary for your convenience. Thank you for allowing [place] to be involved in your care.  It was great to see you today!  I hope you have a Happy Thanksgiving!!

## 2018-07-30 NOTE — Progress Notes (Signed)
cc'ed to pcp °

## 2018-08-16 NOTE — Progress Notes (Signed)
[facility] Note  08/17/2018     CHIEF COMPLAINT Patient presents for Retina Follow Up   HISTORY OF PRESENT ILLNESS: Darlene Crawford is a 77 y.o. female who presents to the clinic today for:   HPI    Retina Follow Up    Patient presents with  Wet AMD.  In right eye.  This started months ago.  Severity is moderate.  Duration of months.  Since onset it is stable.  I, the attending physician,  performed the HPI with the patient and updated documentation appropriately.          Comments    77 y/o female pt here for 4 wk f/u for wet ARMD OD w/active CNV.  No change in [place] OU.  Denies pain, flashes, new floaters.  Refresh prn OU.       Last edited by Bernarda Caffey, MD on 08/19/2018  1:22 AM. (History)    pt states vision is still the same  Referring physician: Leticia Clas, [address] [facility] [address] [place], [place] [address]  HISTORICAL INFORMATION:   Selected notes from the MEDICAL RECORD NUMBER Referred by Dr. Leander Rams for concern of ARMD OD with progression to CNVM vs CSR;  LEE- 01.29.19 (R. Davis) [BCVA OD: 20/100 OS: 20/30] Ocular Hx- AMD OU, cataract OU, DES OU, HTN retinopathy OU, WWP OU, choroidal nevus OD;  PMH- elevated chol, HTN, hyperthyoridism    CURRENT MEDICATIONS: Current Outpatient Medications (Ophthalmic Drugs)  Medication Sig  . Artificial Tear Ointment (DRY EYES OP) Apply to eye.   No current facility-administered medications for this visit.  (Ophthalmic Drugs)   Current Outpatient Medications (Other)  Medication Sig  . acidophilus (RISAQUAD) CAPS capsule Take 1 capsule by mouth daily.  Marland Kitchen atorvastatin (LIPITOR) 10 MG tablet   . Cholecalciferol (VITAMIN D) 50 MCG (2000 UT) tablet Take 4,000 Units by mouth daily.  . hydrochlorothiazide (HYDRODIURIL) 25 MG tablet Take 25 mg by mouth daily.   Marland Kitchen levothyroxine (SYNTHROID, LEVOTHROID) 100 MCG tablet Take 100 mcg by mouth daily before breakfast.  . loratadine (CLARITIN) 10 MG  tablet Take 10 mg by mouth daily.  Marland Kitchen losartan (COZAAR) 100 MG tablet Take 100 mg by mouth daily.   . Melatonin-Pyridoxine (MELATIN PO) Take by mouth.  . Multiple Vitamins-Minerals (PRESERVISION AREDS 2 PO) Take by mouth.  . polyethylene glycol-electrolytes (TRILYTE) 420 g solution Take 4,000 mLs by mouth as directed.  . sertraline (ZOLOFT) 50 MG tablet Take 50 mg by mouth daily.   Current Facility-Administered Medications (Other)  Medication Route  . Bevacizumab (AVASTIN) SOLN 1.25 mg Intravitreal  . Bevacizumab (AVASTIN) SOLN 1.25 mg Intravitreal  . Bevacizumab (AVASTIN) SOLN 1.25 mg Intravitreal      REVIEW OF SYSTEMS: ROS    Positive for: Eyes   Negative for: Constitutional, Gastrointestinal, Neurological, Skin, Genitourinary, Musculoskeletal, HENT, Endocrine, Cardiovascular, Respiratory, Psychiatric, Allergic/Imm, Heme/Lymph   Last edited by Matthew Folks, COA on 08/17/2018  2:11 PM. (History)       ALLERGIES Allergies  Allergen Reactions  . Vicodin [Hydrocodone-Acetaminophen]     Vomiting, nausea    PAST MEDICAL HISTORY Past Medical History:  Diagnosis Date  . Anxiety and depression   . Hypercholesterolemia   . Hypertension   . Hypothyroidism   . Macular degeneration    OD  . Osteoarthritis   . Seasonal allergies    Past Surgical History:  Procedure Laterality Date  . TUBAL LIGATION    .  UTERINE FIBROID SURGERY      FAMILY HISTORY Family History  Problem Relation Age of Onset  . Cancer Mother        lung  . Stroke Father   . Stroke Brother   . Colon cancer Neg Hx     SOCIAL HISTORY Social History   Tobacco Use  . Smoking status: Former Smoker    Types: Cigarettes  . Smokeless tobacco: Never Used  Substance Use Topics  . Alcohol use: No    Frequency: Never  . Drug use: No         OPHTHALMIC EXAM:  Base Eye Exam    Visual Acuity (Snellen - Linear)      Right Left   Dist cc 20/40 20/20   Dist ph cc NI    Correction:  Glasses        Tonometry (Tonopen, 2:14 PM)      Right Left   Pressure 12 14       Pupils      Dark Light Shape React APD   Right 4 2 Round Brisk None   Left 4 2 Round Brisk None       Visual Fields (Counting fingers)      Left Right    Full Full       Extraocular Movement      Right Left    Full, Ortho Full, Ortho       Neuro/Psych    Oriented x3:  Yes   Mood/Affect:  Normal       Dilation    Both eyes:  1.0% Mydriacyl, 2.5% Phenylephrine @ 2:14 PM        Slit Lamp and Fundus Exam    Slit Lamp Exam      Right Left   Lids/Lashes Dermatochalasis - upper lid Dermatochalasis - upper lid   Conjunctiva/Sclera White and quiet White and quiet   Cornea Arcus, early nasal band K Arcus, early nasal band K   Anterior Chamber moderate depth, with narrow angle temporally moderate depth, with narrow angle temporally   Iris Round and well dilated Round and well dilated   Lens 2+ Nuclear sclerosis, 2+ Cortical cataract 2+ Nuclear sclerosis, 2+ Cortical cataract   Vitreous Vitreous syneresis, Posterior vitreous detachment, Weiss ring Vitreous syneresis       Fundus Exam      Right Left   Disc Pink and Sharp Pink and Sharp, Tilted disc   C/D Ratio 0.3 0.4   Macula Blunted foveal reflex, Drusen, RPE mottling and clumping, central SRF and edema--interval decrease in SRF, punctate areas of RPE hypopigmentation Flat, Retinal pigment epithelial mottling, superior para-foveal focal area of RPE atrophy -- stable, rare drusen, No heme or edema   Vessels Copper wiring, AV crossing changes, Tortuous, Vascular attenuation Copper wiring, AV crossing changes, mildly Tortuous   Periphery Attached, peripheral drusen / RPE changes nasally, peripheral cystoid degeneration Patch of Lattice degeneration at 0600, scattered peripheral drusen, peripheral cystoid degeneration          IMAGING AND PROCEDURES  Imaging and Procedures for 12/11/17  OCT, Retina - OU - Both Eyes       Right Eye Quality was  good. Central Foveal Thickness: 399. Progression has improved. Findings include subretinal fluid, no IRF, retinal drusen , pigment epithelial detachment, abnormal foveal contour (Interval improvement in SRF).   Left Eye Quality was good. Central Foveal Thickness: 279. Progression has been stable. Findings include normal foveal contour, no IRF, no SRF, retinal drusen .  Notes *Images captured and stored on drive  Diagnosis / Impression:  OD: persistent SRF -- interval improvement in SRF OS: NFP, No IRF/SRF  Clinical management:  See below  Abbreviations: NFP - Normal foveal profile. CME - cystoid macular edema. PED - pigment epithelial detachment. IRF - intraretinal fluid. SRF - subretinal fluid. EZ - ellipsoid zone. ERM - epiretinal membrane. ORA - outer retinal atrophy. ORT - outer retinal tubulation. SRHM - subretinal hyper-reflective material         Intravitreal Injection, Pharmacologic Agent - OD - Right Eye       Time Out 08/17/2018. 4:06 PM. Confirmed correct patient, procedure, site, and patient consented.   Anesthesia Topical anesthesia was used. Anesthetic medications included Lidocaine 2%, Proparacaine 0.5%.   Procedure Preparation included 5% betadine to ocular surface, eyelid speculum. A 30 gauge needle was used.   Injection:  2 mg aflibercept Alfonse Flavors) SOLN   NDC: M7179715, Lot: [id number], Expiration date: 06/28/2019   Route: Intravitreal, Site: Right Eye, Waste: 0.05 mL  Post-op Post injection exam found visual acuity of at least counting fingers. The patient tolerated the procedure well. There were no complications. The patient received written and verbal post procedure care education.                 ASSESSMENT/PLAN:    ICD-10-CM   1. Exudative age-related macular degeneration of right eye with active choroidal neovascularization (HCC) H35.3211 Intravitreal Injection, Pharmacologic Agent - OD - Right Eye  2. 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 Retinal edema H35.81 OCT, Retina - OU - Both Eyes  4. Essential hypertension I10   5. Hypertensive retinopathy of both eyes H35.033   6. 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 Posterior vitreous detachment of right eye H43.811   8. 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 Exudative age related macular degeneration vs CSCR OD    - repeat FA (03.02.19) without leakage / pooling into area of SRF  - review of systems reveals significant stressors in life, but no exogenous steroid use  - discussed treatment with anti-VEGF vs 25mg  of Eplerenone  - pt unable to afford Eplerenone   - S/P IVA #1 OD (09.27.19),  #2 (10.25.19), #3 (11.22.19)  - discussed possibility of switching therapy if SRF remains unresponsive to IVA  - today, VA stable at 20/40 OD and pt reports no subjective change in vision -- still with central blur  - OCT today with persistent central SRF -- mild interval improvement  - discussed possible switch in therapy; pt approved for [employer] and IVE  - recommend IVE OD #1 today, 12.20.19  - pt wishes to proceed  - RBA of procedure discussed, questions answered  - informed consent obtained and signed  - see procedure note  - [email or web address] benefits investigation initiated, 11.22.19, approved as of 12.20.19  - f/u 4 weeks  4,5. Hypertensive retinopathy OU - discussed importance of tight BP control - stable - monitor  6. Lattice degeneration OS- - patch of inferior lattice OS -- no RT, holes or SRF - discussed findings, prognosis, and treatment options including observation - monitor  7. PVD / vitreous syneresis OD-   Discussed findings and prognosis  No RT or RD on 360 peripheral exam  Reviewed s/s of RT/RD  Strict return precautions for any such RT/RD signs/symptoms    8. Combined form age-related cataract OU-  - The symptoms of cataract, surgical options,  and treatments and risks were discussed with  patient. - discussed diagnosis and progression - not yet visually significant - monitor for now  Ophthalmic Meds Ordered this visit:  No orders of the defined types were placed in this encounter.      Return in about 4 weeks (around 09/14/2018) for F/U CSR OD, DFE, OCT.  There are no Patient Instructions on file for this visit.   Explained the diagnoses, plan, and follow up with the patient and they expressed understanding.  Patient expressed understanding of the importance of proper follow up care.   This document serves as a record of services personally performed by Gardiner Sleeper, MD, PhD. It was created on their behalf by Ernest Mallick, OA, an ophthalmic assistant. The creation of this record is the provider's dictation and/or activities during the visit.    Electronically signed by: Ernest Mallick, OA  12.19.19 1:22 AM     Gardiner Sleeper, M.D., Ph.D. Diseases & Surgery of the Retina and Vitreous [facility]  I have reviewed the above documentation for accuracy and completeness, and I agree with the above. Gardiner Sleeper, M.D., Ph.D. 08/19/18 1:25 AM    Abbreviations: M myopia (nearsighted); A astigmatism; H hyperopia (farsighted); P presbyopia; Mrx spectacle prescription;  CTL contact lenses; OD right eye; OS left eye; OU both eyes  XT exotropia; ET esotropia; PEK punctate epithelial keratitis; PEE punctate epithelial erosions; DES dry eye syndrome; MGD meibomian gland dysfunction; ATs artificial tears; PFAT's preservative free artificial tears; [place] nuclear sclerotic cataract; PSC posterior subcapsular cataract; ERM epi-retinal membrane; PVD posterior vitreous detachment; RD retinal detachment; DM diabetes mellitus; DR diabetic retinopathy; NPDR non-proliferative diabetic retinopathy; PDR proliferative diabetic retinopathy; CSME clinically significant macular edema; DME diabetic macular edema; dbh dot blot hemorrhages; CWS cotton wool spot; POAG primary  open angle glaucoma; C/D cup-to-disc ratio; HVF humphrey visual field; GVF goldmann visual field; OCT optical coherence tomography; IOP intraocular pressure; BRVO Branch retinal vein occlusion; CRVO central retinal vein occlusion; CRAO central retinal artery occlusion; BRAO branch retinal artery occlusion; RT retinal tear; SB scleral buckle; PPV pars plana vitrectomy; VH Vitreous hemorrhage; PRP panretinal laser photocoagulation; IVK intravitreal kenalog; VMT vitreomacular traction; MH Macular hole;  NVD neovascularization of the disc; NVE neovascularization elsewhere; AREDS age related eye disease study; ARMD age related macular degeneration; POAG primary open angle glaucoma; EBMD epithelial/anterior basement membrane dystrophy; ACIOL anterior chamber intraocular lens; IOL intraocular lens; PCIOL posterior chamber intraocular lens; Phaco/IOL phacoemulsification with intraocular lens placement; [place] photorefractive keratectomy; LASIK laser assisted in situ keratomileusis; HTN hypertension; DM diabetes mellitus; COPD chronic obstructive pulmonary disease

## 2018-08-17 ENCOUNTER — Encounter (INDEPENDENT_AMBULATORY_CARE_PROVIDER_SITE_OTHER): Payer: Self-pay | Admitting: Ophthalmology

## 2018-08-17 ENCOUNTER — Ambulatory Visit (INDEPENDENT_AMBULATORY_CARE_PROVIDER_SITE_OTHER): Payer: Medicare HMO | Admitting: Ophthalmology

## 2018-08-17 DIAGNOSIS — H25813 Combined forms of age-related cataract, bilateral: Secondary | ICD-10-CM

## 2018-08-17 DIAGNOSIS — H353211 Exudative age-related macular degeneration, right eye, with active choroidal neovascularization: Secondary | ICD-10-CM

## 2018-08-17 DIAGNOSIS — I10 Essential (primary) hypertension: Secondary | ICD-10-CM | POA: Diagnosis not present

## 2018-08-17 DIAGNOSIS — H35033 Hypertensive retinopathy, bilateral: Secondary | ICD-10-CM | POA: Diagnosis not present

## 2018-08-17 DIAGNOSIS — H35711 Central serous chorioretinopathy, right eye: Secondary | ICD-10-CM

## 2018-08-17 DIAGNOSIS — H43811 Vitreous degeneration, right eye: Secondary | ICD-10-CM | POA: Diagnosis not present

## 2018-08-17 DIAGNOSIS — H35412 Lattice degeneration of retina, left eye: Secondary | ICD-10-CM | POA: Diagnosis not present

## 2018-08-17 DIAGNOSIS — H3581 Retinal edema: Secondary | ICD-10-CM

## 2018-08-19 ENCOUNTER — Encounter (INDEPENDENT_AMBULATORY_CARE_PROVIDER_SITE_OTHER): Payer: Self-pay | Admitting: Ophthalmology

## 2018-08-19 MED ORDER — AFLIBERCEPT 2MG/0.05ML IZ SOLN FOR KALEIDOSCOPE
2.0000 mg | INTRAVITREAL | Status: AC
  Administered 2018-08-19: 2 mg via INTRAVITREAL

## 2018-08-31 DIAGNOSIS — I10 Essential (primary) hypertension: Secondary | ICD-10-CM | POA: Diagnosis not present

## 2018-08-31 DIAGNOSIS — E7800 Pure hypercholesterolemia, unspecified: Secondary | ICD-10-CM | POA: Diagnosis not present

## 2018-08-31 DIAGNOSIS — Z6832 Body mass index (BMI) 32.0-32.9, adult: Secondary | ICD-10-CM | POA: Diagnosis not present

## 2018-08-31 DIAGNOSIS — E039 Hypothyroidism, unspecified: Secondary | ICD-10-CM | POA: Diagnosis not present

## 2018-08-31 DIAGNOSIS — Z299 Encounter for prophylactic measures, unspecified: Secondary | ICD-10-CM | POA: Diagnosis not present

## 2018-08-31 DIAGNOSIS — R69 Illness, unspecified: Secondary | ICD-10-CM | POA: Diagnosis not present

## 2018-09-17 NOTE — Progress Notes (Signed)
[facility] Note  09/18/2018     CHIEF COMPLAINT Patient presents for Retina Follow Up   HISTORY OF PRESENT ILLNESS: Darlene Crawford is a 78 y.o. female who presents to the clinic today for:   HPI    Retina Follow Up    Patient presents with  Wet AMD.  In right eye.  This started 1 year ago.  Severity is mild.  Since onset it is stable.  I, the attending physician,  performed the HPI with the patient and updated documentation appropriately.          Comments    F/U EXU AMD. Patient states her vision is 'about the same", occasional floaters, denies new visual onsets issues. Pt is ready for tx today if indicted.       Last edited by Bernarda Caffey, MD on 09/18/2018  1:58 PM. (History)    pt states she has not noticed much of a change in vision since receiving Eylea last visit  Referring physician: Monico Blitz, MD [place], [place] [address]  HISTORICAL INFORMATION:   Selected notes from the MEDICAL RECORD NUMBER Referred by Dr. Leander Rams for concern of ARMD OD with progression to CNVM vs CSR;  LEE- 01.29.19 (R. Davis) [BCVA OD: 20/100 OS: 20/30] Ocular Hx- AMD OU, cataract OU, DES OU, HTN retinopathy OU, WWP OU, choroidal nevus OD;  PMH- elevated chol, HTN, hyperthyoridism    CURRENT MEDICATIONS: Current Outpatient Medications (Ophthalmic Drugs)  Medication Sig  . ARTIFICIAL TEAR SOLUTION OP Place 1 drop into both eyes daily as needed (dry eyes).   Current Facility-Administered Medications (Ophthalmic Drugs)  Medication Route  . aflibercept (EYLEA) SOLN 2 mg Intravitreal  . aflibercept (EYLEA) SOLN 2 mg Intravitreal   Current Outpatient Medications (Other)  Medication Sig  . acidophilus (RISAQUAD) CAPS capsule Take 1 capsule by mouth daily.  Marland Kitchen atorvastatin (LIPITOR) 10 MG tablet Take 10 mg by mouth at bedtime.   . Cholecalciferol (VITAMIN D) 50 MCG (2000 UT) tablet Take 4,000 Units by mouth at bedtime.   . hydrochlorothiazide  (HYDRODIURIL) 25 MG tablet Take 25 mg by mouth daily.   Marland Kitchen levothyroxine (SYNTHROID, LEVOTHROID) 100 MCG tablet Take 100 mcg by mouth daily before breakfast.  . loratadine (CLARITIN) 10 MG tablet Take 10 mg by mouth daily as needed for allergies.   Marland Kitchen losartan (COZAAR) 100 MG tablet Take 100 mg by mouth daily.   . Multiple Vitamins-Minerals (PRESERVISION AREDS 2 PO) Take 1 tablet by mouth at bedtime.   . polyethylene glycol-electrolytes (TRILYTE) 420 g solution Take 4,000 mLs by mouth as directed.  . sertraline (ZOLOFT) 50 MG tablet Take 50 mg by mouth daily.  . Cyanocobalamin (B-12 PO) Take 1 Dose by mouth 2 (two) times a week.  . fluticasone (FLONASE) 50 MCG/ACT nasal spray Place 1 spray into both nostrils daily as needed for allergies or rhinitis.  . Melatonin 10 MG CAPS Take 10 mg by mouth at bedtime.  . sodium chloride (OCEAN) 0.65 % SOLN nasal spray Place 1 spray into both nostrils as needed for congestion.   Current Facility-Administered Medications (Other)  Medication Route  . Bevacizumab (AVASTIN) SOLN 1.25 mg Intravitreal  . Bevacizumab (AVASTIN) SOLN 1.25 mg Intravitreal  . Bevacizumab (AVASTIN) SOLN 1.25 mg Intravitreal      REVIEW OF SYSTEMS: ROS    Positive for: Eyes   Negative for: Constitutional, Gastrointestinal, Neurological, Skin, Genitourinary, Musculoskeletal, HENT, Endocrine, Cardiovascular, Respiratory, Psychiatric, Allergic/Imm, Heme/Lymph  Last edited by Zenovia Jordan, LPN on 2/42/3536  1:44 PM. (History)       ALLERGIES Allergies  Allergen Reactions  . Vicodin [Hydrocodone-Acetaminophen] Nausea And Vomiting    PAST MEDICAL HISTORY Past Medical History:  Diagnosis Date  . Anxiety and depression   . Hypercholesterolemia   . Hypertension   . Hypothyroidism   . Macular degeneration    OD  . Osteoarthritis   . Seasonal allergies    Past Surgical History:  Procedure Laterality Date  . TUBAL LIGATION    . UTERINE FIBROID SURGERY      FAMILY  HISTORY Family History  Problem Relation Age of Onset  . Cancer Mother        lung  . Stroke Father   . Stroke Brother   . Colon cancer Neg Hx     SOCIAL HISTORY Social History   Tobacco Use  . Smoking status: Former Smoker    Types: Cigarettes  . Smokeless tobacco: Never Used  Substance Use Topics  . Alcohol use: No    Frequency: Never  . Drug use: No         OPHTHALMIC EXAM:  Base Eye Exam    Visual Acuity (Snellen - Linear)      Right Left   Dist cc 20/50 +1 20/25 -1   Dist ph cc 20/40 -2 20/20 -1   Correction:  Glasses       Pupils      Dark Light Shape React APD   Right 3 2 Round Brisk None   Left 3 2 Round Brisk None       Visual Fields (Counting fingers)      Left Right    Full Full       Extraocular Movement      Right Left    Full, Ortho Full, Ortho       Neuro/Psych    Oriented x3:  Yes   Mood/Affect:  Normal       Dilation    Both eyes:  1.0% Mydriacyl, 2.5% Phenylephrine @ 1:13 PM        Slit Lamp and Fundus Exam    Slit Lamp Exam      Right Left   Lids/Lashes Dermatochalasis - upper lid Dermatochalasis - upper lid   Conjunctiva/Sclera White and quiet White and quiet   Cornea Arcus, early nasal band K Arcus, early nasal band K   Anterior Chamber moderate depth, with narrow angle temporally moderate depth, with narrow angle temporally   Iris Round and well dilated Round and well dilated   Lens 2+ Nuclear sclerosis, 2+ Cortical cataract 2+ Nuclear sclerosis, 2+ Cortical cataract   Vitreous Vitreous syneresis, Posterior vitreous detachment, Weiss ring Vitreous syneresis       Fundus Exam      Right Left   Disc Pink and Sharp Pink and Sharp, Tilted disc   C/D Ratio 0.3 0.4   Macula Blunted foveal reflex, Drusen, RPE atrophy, central SRF and edema--interval improvement in SRF Flat, Retinal pigment epithelial mottling, superior para-foveal focal area of RPE atrophy -- stable, rare drusen, No heme or edema   Vessels Mild AV crossing  changes, Tortuous, Vascular attenuation Copper wiring, AV crossing changes, mildly Tortuous   Periphery Attached, peripheral drusen / RPE changes nasally, peripheral cystoid degeneration Patch of Lattice degeneration at 0600, scattered peripheral drusen, peripheral cystoid degeneration          IMAGING AND PROCEDURES  Imaging and Procedures for 12/11/17  OCT, Retina -  OU - Both Eyes       Right Eye Quality was good. Central Foveal Thickness: 295. Progression has improved. Findings include subretinal fluid, no IRF, retinal drusen , pigment epithelial detachment, abnormal foveal contour, subretinal hyper-reflective material (Interval improvement in SRF).   Left Eye Quality was good. Central Foveal Thickness: 276. Progression has been stable. Findings include normal foveal contour, no IRF, no SRF, retinal drusen .   Notes *Images captured and stored on drive  Diagnosis / Impression:  OD: interval improvement in SRF; shallow SRHM OS: NFP, No IRF/SRF  Clinical management:  See below  Abbreviations: NFP - Normal foveal profile. CME - cystoid macular edema. PED - pigment epithelial detachment. IRF - intraretinal fluid. SRF - subretinal fluid. EZ - ellipsoid zone. ERM - epiretinal membrane. ORA - outer retinal atrophy. ORT - outer retinal tubulation. SRHM - subretinal hyper-reflective material         Intravitreal Injection, Pharmacologic Agent - OD - Right Eye       Time Out 09/18/2018. 1:40 PM. Confirmed correct patient, procedure, site, and patient consented.   Anesthesia Topical anesthesia was used. Anesthetic medications included Lidocaine 2%, Proparacaine 0.5%.   Procedure Preparation included 5% betadine to ocular surface, eyelid speculum. A 30 gauge needle was used.   Injection:  2 mg aflibercept Alfonse Flavors) SOLN   NDC: [id number], Lot: [id number], Expiration date: 07/29/2019   Route: Intravitreal, Site: Right Eye, Waste: 0.05 mL  Post-op Post injection exam found  visual acuity of at least counting fingers. The patient tolerated the procedure well. There were no complications. The patient received written and verbal post procedure care education.                 ASSESSMENT/PLAN:    ICD-10-CM   1. Exudative age-related macular degeneration of right eye with active choroidal neovascularization (HCC) H35.3211 Intravitreal Injection, Pharmacologic Agent - OD - Right Eye    aflibercept (EYLEA) SOLN 2 mg  2. 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 Retinal edema H35.81 OCT, Retina - OU - Both Eyes  4. Essential hypertension I10   5. Hypertensive retinopathy of both eyes H35.033   6. 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 Posterior vitreous detachment of right eye H43.811   8. 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 Exudative age related macular degeneration vs CSCR OD    - repeat FA (03.02.19) without leakage / pooling into area of SRF  - review of systems reveals significant stressors in life, but no exogenous steroid use  - discussed treatment with anti-VEGF vs 25mg  of Eplerenone  - pt unable to afford Eplerenone   - S/P IVA #1 OD (09.27.19),  #2 (10.25.19), #3 (11.22.19) -- minimal response  - pt approved for [employer] and IVE  - S/P IVE OD #1 (12.20.19)  - today, VA stable at 20/40 OD and pt reports no subjective change in vision -- still with central blur  - OCT today with interval improvement in SRF  - recommend IVE OD #2 today, 01.21.20  - pt wishes to proceed  - RBA of procedure discussed, questions answered  - informed consent obtained and signed  - see procedure note  - [employer] benefits investigation initiated -- approved for 2020  - f/u 4 weeks  4,5. Hypertensive retinopathy OU - discussed importance of tight BP control - stable - monitor  6. Lattice degeneration OS- - patch of inferior lattice OS -- no  RT, holes or SRF - discussed findings, prognosis, and treatment  options including observation - monitor  7. PVD / vitreous syneresis OD-   Discussed findings and prognosis  No RT or RD on 360 peripheral exam  Reviewed s/s of RT/RD  Strict return precautions for any such RT/RD signs/symptoms   8. Combined form age-related cataract OU-  - The symptoms of cataract, surgical options, and treatments and risks were discussed with patient. - discussed diagnosis and progression - not yet visually significant - Monitor for now   Ophthalmic Meds Ordered this visit:  Meds ordered this encounter  Medications  . aflibercept (EYLEA) SOLN 2 mg       Return in about 4 weeks (around 10/16/2018) for f/u exu ARMD OD, DFE, OCT.  There are no Patient Instructions on file for this visit.   Explained the diagnoses, plan, and follow up with the patient and they expressed understanding.  Patient expressed understanding of the importance of proper follow up care.   This document serves as a record of services personally performed by Gardiner Sleeper, MD, PhD. It was created on their behalf by Ernest Mallick, OA, an ophthalmic assistant. The creation of this record is the provider's dictation and/or activities during the visit.    Electronically signed by: Ernest Mallick, OA  01.20.2020 4:50 PM    Gardiner Sleeper, M.D., Ph.D. Diseases & Surgery of the Retina and Vitreous [facility]  I have reviewed the above documentation for accuracy and completeness, and I agree with the above. Gardiner Sleeper, M.D., Ph.D. 09/21/18 4:50 PM     Abbreviations: M myopia (nearsighted); A astigmatism; H hyperopia (farsighted); P presbyopia; Mrx spectacle prescription;  CTL contact lenses; OD right eye; OS left eye; OU both eyes  XT exotropia; ET esotropia; PEK punctate epithelial keratitis; PEE punctate epithelial erosions; DES dry eye syndrome; MGD meibomian gland dysfunction; ATs artificial tears; PFAT's preservative free artificial tears; [place] nuclear  sclerotic cataract; PSC posterior subcapsular cataract; ERM epi-retinal membrane; PVD posterior vitreous detachment; RD retinal detachment; DM diabetes mellitus; DR diabetic retinopathy; NPDR non-proliferative diabetic retinopathy; PDR proliferative diabetic retinopathy; CSME clinically significant macular edema; DME diabetic macular edema; dbh dot blot hemorrhages; CWS cotton wool spot; POAG primary open angle glaucoma; C/D cup-to-disc ratio; HVF humphrey visual field; GVF goldmann visual field; OCT optical coherence tomography; IOP intraocular pressure; BRVO Branch retinal vein occlusion; CRVO central retinal vein occlusion; CRAO central retinal artery occlusion; BRAO branch retinal artery occlusion; RT retinal tear; SB scleral buckle; PPV pars plana vitrectomy; VH Vitreous hemorrhage; PRP panretinal laser photocoagulation; IVK intravitreal kenalog; VMT vitreomacular traction; MH Macular hole;  NVD neovascularization of the disc; NVE neovascularization elsewhere; AREDS age related eye disease study; ARMD age related macular degeneration; POAG primary open angle glaucoma; EBMD epithelial/anterior basement membrane dystrophy; ACIOL anterior chamber intraocular lens; IOL intraocular lens; PCIOL posterior chamber intraocular lens; Phaco/IOL phacoemulsification with intraocular lens placement; [place] photorefractive keratectomy; LASIK laser assisted in situ keratomileusis; HTN hypertension; DM diabetes mellitus; COPD chronic obstructive pulmonary disease

## 2018-09-18 ENCOUNTER — Ambulatory Visit (INDEPENDENT_AMBULATORY_CARE_PROVIDER_SITE_OTHER): Payer: Medicare HMO | Admitting: Ophthalmology

## 2018-09-18 ENCOUNTER — Encounter (INDEPENDENT_AMBULATORY_CARE_PROVIDER_SITE_OTHER): Payer: Self-pay | Admitting: Ophthalmology

## 2018-09-18 DIAGNOSIS — H25813 Combined forms of age-related cataract, bilateral: Secondary | ICD-10-CM

## 2018-09-18 DIAGNOSIS — H35711 Central serous chorioretinopathy, right eye: Secondary | ICD-10-CM

## 2018-09-18 DIAGNOSIS — H353211 Exudative age-related macular degeneration, right eye, with active choroidal neovascularization: Secondary | ICD-10-CM

## 2018-09-18 DIAGNOSIS — H3581 Retinal edema: Secondary | ICD-10-CM

## 2018-09-18 DIAGNOSIS — H43811 Vitreous degeneration, right eye: Secondary | ICD-10-CM

## 2018-09-18 DIAGNOSIS — I10 Essential (primary) hypertension: Secondary | ICD-10-CM

## 2018-09-18 DIAGNOSIS — H35412 Lattice degeneration of retina, left eye: Secondary | ICD-10-CM

## 2018-09-18 DIAGNOSIS — H35033 Hypertensive retinopathy, bilateral: Secondary | ICD-10-CM

## 2018-09-19 DIAGNOSIS — H353211 Exudative age-related macular degeneration, right eye, with active choroidal neovascularization: Secondary | ICD-10-CM | POA: Diagnosis not present

## 2018-09-19 DIAGNOSIS — H3581 Retinal edema: Secondary | ICD-10-CM | POA: Diagnosis not present

## 2018-09-19 MED ORDER — AFLIBERCEPT 2MG/0.05ML IZ SOLN FOR KALEIDOSCOPE
2.0000 mg | INTRAVITREAL | Status: AC
  Administered 2018-09-19: 2 mg via INTRAVITREAL

## 2018-09-21 ENCOUNTER — Encounter (INDEPENDENT_AMBULATORY_CARE_PROVIDER_SITE_OTHER): Payer: Self-pay | Admitting: Ophthalmology

## 2018-09-21 DIAGNOSIS — Z6833 Body mass index (BMI) 33.0-33.9, adult: Secondary | ICD-10-CM | POA: Diagnosis not present

## 2018-09-21 DIAGNOSIS — Z299 Encounter for prophylactic measures, unspecified: Secondary | ICD-10-CM | POA: Diagnosis not present

## 2018-09-21 DIAGNOSIS — E7800 Pure hypercholesterolemia, unspecified: Secondary | ICD-10-CM | POA: Diagnosis not present

## 2018-09-21 DIAGNOSIS — I10 Essential (primary) hypertension: Secondary | ICD-10-CM | POA: Diagnosis not present

## 2018-09-21 DIAGNOSIS — E039 Hypothyroidism, unspecified: Secondary | ICD-10-CM | POA: Diagnosis not present

## 2018-09-21 DIAGNOSIS — Z789 Other specified health status: Secondary | ICD-10-CM | POA: Diagnosis not present

## 2018-10-01 ENCOUNTER — Ambulatory Visit (HOSPITAL_COMMUNITY)
Admission: RE | Admit: 2018-10-01 | Discharge: 2018-10-01 | Disposition: A | Discharge status: Home / Self Care | Payer: Medicare HMO | Attending: Gastroenterology | Admitting: Gastroenterology

## 2018-10-01 ENCOUNTER — Encounter (HOSPITAL_COMMUNITY): Payer: Self-pay | Admitting: *Deleted

## 2018-10-01 ENCOUNTER — Other Ambulatory Visit: Payer: Self-pay

## 2018-10-01 ENCOUNTER — Encounter (HOSPITAL_COMMUNITY)
Admission: RE | Disposition: A | Discharge status: Home / Self Care | Payer: Self-pay | Source: Home / Self Care | Attending: Gastroenterology

## 2018-10-01 DIAGNOSIS — Z1211 Encounter for screening for malignant neoplasm of colon: Secondary | ICD-10-CM

## 2018-10-01 DIAGNOSIS — I10 Essential (primary) hypertension: Secondary | ICD-10-CM | POA: Insufficient documentation

## 2018-10-01 DIAGNOSIS — M1990 Unspecified osteoarthritis, unspecified site: Secondary | ICD-10-CM | POA: Insufficient documentation

## 2018-10-01 DIAGNOSIS — Z801 Family history of malignant neoplasm of trachea, bronchus and lung: Secondary | ICD-10-CM | POA: Insufficient documentation

## 2018-10-01 DIAGNOSIS — Z823 Family history of stroke: Secondary | ICD-10-CM | POA: Insufficient documentation

## 2018-10-01 DIAGNOSIS — K317 Polyp of stomach and duodenum: Secondary | ICD-10-CM | POA: Diagnosis not present

## 2018-10-01 DIAGNOSIS — K2950 Unspecified chronic gastritis without bleeding: Secondary | ICD-10-CM | POA: Diagnosis not present

## 2018-10-01 DIAGNOSIS — D120 Benign neoplasm of cecum: Secondary | ICD-10-CM | POA: Diagnosis not present

## 2018-10-01 DIAGNOSIS — K2980 Duodenitis without bleeding: Secondary | ICD-10-CM

## 2018-10-01 DIAGNOSIS — Z885 Allergy status to narcotic agent status: Secondary | ICD-10-CM | POA: Insufficient documentation

## 2018-10-01 DIAGNOSIS — Z79899 Other long term (current) drug therapy: Secondary | ICD-10-CM | POA: Insufficient documentation

## 2018-10-01 DIAGNOSIS — K648 Other hemorrhoids: Secondary | ICD-10-CM | POA: Diagnosis not present

## 2018-10-01 DIAGNOSIS — F329 Major depressive disorder, single episode, unspecified: Secondary | ICD-10-CM | POA: Insufficient documentation

## 2018-10-01 DIAGNOSIS — K222 Esophageal obstruction: Secondary | ICD-10-CM

## 2018-10-01 DIAGNOSIS — K920 Hematemesis: Secondary | ICD-10-CM

## 2018-10-01 DIAGNOSIS — H3530 Unspecified macular degeneration: Secondary | ICD-10-CM | POA: Diagnosis not present

## 2018-10-01 DIAGNOSIS — E039 Hypothyroidism, unspecified: Secondary | ICD-10-CM | POA: Diagnosis not present

## 2018-10-01 DIAGNOSIS — F419 Anxiety disorder, unspecified: Secondary | ICD-10-CM | POA: Insufficient documentation

## 2018-10-01 DIAGNOSIS — E7800 Pure hypercholesterolemia, unspecified: Secondary | ICD-10-CM | POA: Diagnosis not present

## 2018-10-01 DIAGNOSIS — K226 Gastro-esophageal laceration-hemorrhage syndrome: Secondary | ICD-10-CM | POA: Diagnosis not present

## 2018-10-01 DIAGNOSIS — Q438 Other specified congenital malformations of intestine: Secondary | ICD-10-CM | POA: Diagnosis not present

## 2018-10-01 DIAGNOSIS — K2970 Gastritis, unspecified, without bleeding: Secondary | ICD-10-CM | POA: Diagnosis not present

## 2018-10-01 DIAGNOSIS — D123 Benign neoplasm of transverse colon: Secondary | ICD-10-CM | POA: Diagnosis not present

## 2018-10-01 DIAGNOSIS — K449 Diaphragmatic hernia without obstruction or gangrene: Secondary | ICD-10-CM | POA: Diagnosis not present

## 2018-10-01 DIAGNOSIS — R69 Illness, unspecified: Secondary | ICD-10-CM | POA: Diagnosis not present

## 2018-10-01 DIAGNOSIS — R195 Other fecal abnormalities: Secondary | ICD-10-CM

## 2018-10-01 HISTORY — PX: COLONOSCOPY: SHX5424

## 2018-10-01 HISTORY — PX: ESOPHAGOGASTRODUODENOSCOPY: SHX5428

## 2018-10-01 HISTORY — PX: BIOPSY: SHX5522

## 2018-10-01 HISTORY — PX: POLYPECTOMY: SHX5525

## 2018-10-01 SURGERY — COLONOSCOPY
Anesthesia: Moderate Sedation

## 2018-10-01 MED ORDER — MIDAZOLAM HCL 5 MG/5ML IJ SOLN
INTRAMUSCULAR | Status: DC | PRN
  Administered 2018-10-01 (×2): 2 mg via INTRAVENOUS
  Administered 2018-10-01: 1 mg via INTRAVENOUS

## 2018-10-01 MED ORDER — EPINEPHRINE PF 1 MG/10ML IJ SOSY
PREFILLED_SYRINGE | INTRAMUSCULAR | Status: AC
  Filled 2018-10-01: qty 10

## 2018-10-01 MED ORDER — SODIUM CHLORIDE 0.9 % IV SOLN
INTRAVENOUS | Status: DC
  Administered 2018-10-01: 10:00:00 via INTRAVENOUS

## 2018-10-01 MED ORDER — SODIUM CHLORIDE (PF) 0.9 % IJ SOLN
PREFILLED_SYRINGE | INTRAMUSCULAR | Status: DC | PRN
  Administered 2018-10-01: 1 mL

## 2018-10-01 MED ORDER — STERILE WATER FOR IRRIGATION IR SOLN
Status: DC | PRN
  Administered 2018-10-01: 10:00:00

## 2018-10-01 MED ORDER — MEPERIDINE HCL 100 MG/ML IJ SOLN
INTRAMUSCULAR | Status: DC | PRN
  Administered 2018-10-01: 25 mg via INTRAVENOUS
  Administered 2018-10-01: 50 mg via INTRAVENOUS

## 2018-10-01 MED ORDER — ONDANSETRON HCL 4 MG/2ML IJ SOLN
INTRAMUSCULAR | Status: AC
  Filled 2018-10-01: qty 2

## 2018-10-01 MED ORDER — MIDAZOLAM HCL 5 MG/5ML IJ SOLN
INTRAMUSCULAR | Status: AC
  Filled 2018-10-01: qty 10

## 2018-10-01 MED ORDER — LIDOCAINE VISCOUS HCL 2 % MT SOLN
OROMUCOSAL | Status: AC
  Filled 2018-10-01: qty 15

## 2018-10-01 MED ORDER — MEPERIDINE HCL 100 MG/ML IJ SOLN
INTRAMUSCULAR | Status: AC
  Filled 2018-10-01: qty 2

## 2018-10-01 MED ORDER — LIDOCAINE VISCOUS HCL 2 % MT SOLN
OROMUCOSAL | Status: DC | PRN
  Administered 2018-10-01: 4 mL via OROMUCOSAL

## 2018-10-01 MED ORDER — ONDANSETRON HCL 4 MG/2ML IJ SOLN
INTRAMUSCULAR | Status: DC | PRN
  Administered 2018-10-01: 4 mg via INTRAVENOUS

## 2018-10-01 NOTE — Op Note (Signed)
[facility] Patient Name: Darlene Crawford Procedure Date: 10/01/2018 11:01 AM MRN: [id number] Date of Birth: May 28, 1941 Attending MD: Barney Drain MD, MD CSN: [id number] Age: 78 Admit Type: Outpatient Procedure:                Upper GI endoscopy WITH COLD FORCEPS BIOPSY/CONTROL                            BLEEDING Indications:              Hematemesis AFTER VOMITING TRILYTE Providers:                Barney Drain MD, MD, Gwenlyn Fudge RN, RN, Gerome Sam, RN, Randa Spike, Technician Referring MD:             Fuller Canada Manuella Ghazi MD, MD Medicines:                TCS + Midazolam 1 mg IV Complications:            No immediate complications. Estimated Blood Loss:     Estimated blood loss was minimal. Procedure:                Pre-Anesthesia Assessment:                           - Prior to the procedure, a History and Physical                            was performed, and patient medications and                            allergies were reviewed. The patient's tolerance of                            previous anesthesia was also reviewed. The risks                            and benefits of the procedure and the sedation                            options and risks were discussed with the patient.                            All questions were answered, and informed consent                            was obtained. Prior Anticoagulants: The patient has                            taken no previous anticoagulant or antiplatelet                            agents. ASA Grade Assessment: II - A patient with  mild systemic disease. After reviewing the risks                            and benefits, the patient was deemed in                            satisfactory condition to undergo the procedure.                            After obtaining informed consent, the endoscope was                            passed under direct vision. Throughout the                           procedure, the patient's blood pressure, pulse, and                            oxygen saturations were monitored continuously. The                            GIF-H190 ([id number]) scope was introduced through the                            mouth, and advanced to the second part of duodenum.                            The upper GI endoscopy was accomplished without                            difficulty. The patient tolerated the procedure                            well. Scope In: 11:04:03 AM Scope Out: 11:14:04 AM Total Procedure Duration: 0 hours 10 minutes 1 second  Findings:      A medium bleeding Mallory-Weiss tear with stigmata of recent bleeding       was found. Area was successfully injected with 1 mL of a 1:10,000       solution of epinephrine for hemostasis. Coagulation for hemostasis using       bipolar probe was successful.      A widely patent Schatzki ring was found at the gastroesophageal junction.      A small hiatal hernia was present.      A single 3 mm sessile polyp with no stigmata of recent bleeding was       found on the greater curvature of the stomach. The polyp was removed       with a cold biopsy forceps. Resection and retrieval were complete.      Patchy mild inflammation characterized by congestion (edema) and       erythema was found in the gastric antrum. Biopsies were taken with a       cold forceps for Helicobacter pylori testing.      Patchy mild inflammation characterized by congestion (edema) and       erythema was found in the duodenal bulb.  The second portion of the duodenum was normal. Impression:               - HEMATEMESIS DUE TO Mallory-Weiss tear.                           - Widely patent Schatzki ring.                           - Small hiatal hernia.                           - A single gastric polyp. Resected and retrieved.                           - MILD Gastritis/Duodenitis. Moderate Sedation:      Moderate  (conscious) sedation was administered by the endoscopy nurse       and supervised by the endoscopist. The following parameters were       monitored: oxygen saturation, heart rate, blood pressure, and response       to care. Total physician intraservice time was 45 minutes. Recommendation:           - Patient has a contact number available for                            emergencies. The signs and symptoms of potential                            delayed complications were discussed with the                            patient. Return to normal activities tomorrow.                            Written discharge instructions were provided to the                            patient.                           - High fiber diet and low fat diet.                           - Continue present medications.                           - Await pathology results. Procedure Code(s):        --- Professional ---                           90240, 59, Esophagogastroduodenoscopy, flexible,                            transoral; with control of bleeding, any method                           43239, Esophagogastroduodenoscopy, flexible,  transoral; with biopsy, single or multiple                           99153, Moderate sedation; each additional 15                            minutes intraservice time                           99153, Moderate sedation; each additional 15                            minutes intraservice time                           G0500, Moderate sedation services provided by the                            same physician or other qualified health care                            professional performing a gastrointestinal                            endoscopic service that sedation supports,                            requiring the presence of an independent trained                            observer to assist in the monitoring of the                            patient's level  of consciousness and physiological                            status; initial 15 minutes of intra-service time;                            patient age 59 years or older (additional time may                            be reported with [phone], as appropriate) Diagnosis Code(s):        --- Professional ---                           K22.6, Gastro-esophageal laceration-hemorrhage                            syndrome                           K22.2, Esophageal obstruction                           K44.9, Diaphragmatic hernia without obstruction or  gangrene                           K31.7, Polyp of stomach and duodenum                           K29.70, Gastritis, unspecified, without bleeding                           K29.80, Duodenitis without bleeding                           K92.0, Hematemesis CPT copyright 2018 [employer]. All rights reserved. The codes documented in this report are preliminary and upon coder review may  be revised to meet current compliance requirements. Barney Drain, MD Barney Drain MD, MD 10/01/2018 11:45:54 AM This report has been signed electronically. Number of Addenda: 0

## 2018-10-01 NOTE — Op Note (Signed)
[facility] Patient Name: Darlene Crawford Procedure Date: 10/01/2018 10:07 AM MRN: [id number] Date of Birth: 03-20-1941 Attending MD: Barney Drain MD, MD CSN: [id number] Age: 78 Admit Type: Outpatient Procedure:                Colonoscopy WITH COLD SNARE POLYPECTOMY Indications:              Screening for colorectal malignant neoplasm Providers:                Barney Drain MD, MD, Otis Peak B. Sharon Seller, RN,                            Gerome Sam, RN, Randa Spike, Technician Referring MD:             Fuller Canada Manuella Ghazi MD, MD Medicines:                Meperidine 75 mg IV, Midazolam 4 mg IV Complications:            No immediate complications. Estimated Blood Loss:     Estimated blood loss was minimal. Procedure:                Pre-Anesthesia Assessment:                           - Prior to the procedure, a History and Physical                            was performed, and patient medications and                            allergies were reviewed. The patient's tolerance of                            previous anesthesia was also reviewed. The risks                            and benefits of the procedure and the sedation                            options and risks were discussed with the patient.                            All questions were answered, and informed consent                            was obtained. Prior Anticoagulants: The patient has                            taken no previous anticoagulant or antiplatelet                            agents. ASA Grade Assessment: II - A patient with                            mild systemic disease. After reviewing the risks  and benefits, the patient was deemed in                            satisfactory condition to undergo the procedure.                            After obtaining informed consent, the colonoscope                            was passed under direct vision. Throughout the       procedure, the patient's blood pressure, pulse, and                            oxygen saturations were monitored continuously. The                            [id number]-[id number] ([id number]) scope was introduced through                            the anus and advanced to the the cecum, identified                            by appendiceal orifice and ileocecal valve. The                            colonoscopy was somewhat difficult due to a                            tortuous colon. Successful completion of the                            procedure was aided by straightening and shortening                            the scope to obtain bowel loop reduction and                            COLOWRAP. The patient tolerated the procedure well.                            The quality of the bowel preparation was excellent.                            The ileocecal valve, appendiceal orifice, and                            rectum were photographed. Scope In: 10:38:45 AM Scope Out: 11:00:02 AM Scope Withdrawal Time: 0 hours 16 minutes 13 seconds  Total Procedure Duration: 0 hours 21 minutes 17 seconds  Findings:      Four sessile polyps were found in the splenic flexure, hepatic       flexure(2) and cecum. The polyps were 3 to 6 mm in size. These polyps       were removed with a cold snare.  Resection and retrieval were complete.      Internal hemorrhoids were found. The hemorrhoids were small.      The recto-sigmoid colon, sigmoid colon and descending colon were       moderately tortuous. Impression:               - Four 3 to 6 mm polyps at the splenic flexure, at                            the hepatic flexure and in the cecum, removed with                            a cold snare. Resected and retrieved.                           - Internal hemorrhoids.                           - Tortuous colon. Moderate Sedation:      Moderate (conscious) sedation was administered by the endoscopy nurse       and  supervised by the endoscopist. The following parameters were       monitored: oxygen saturation, heart rate, blood pressure, and response       to care. Total physician intraservice time was 45 minutes. Recommendation:           - Patient has a contact number available for                            emergencies. The signs and symptoms of potential                            delayed complications were discussed with the                            patient. Return to normal activities tomorrow.                            Written discharge instructions were provided to the                            patient.                           - High fiber diet.                           - Continue present medications.                           - Await pathology results.                           - Repeat colonoscopy is not recommended due to                            current age (51 years or older) for surveillance. Procedure Code(s):        ---  Professional ---                           [phone], Colonoscopy, flexible; with removal of                            tumor(s), polyp(s), or other lesion(s) by snare                            technique                           99153, Moderate sedation; each additional 15                            minutes intraservice time                           99153, Moderate sedation; each additional 15                            minutes intraservice time                           G0500, Moderate sedation services provided by the                            same physician or other qualified health care                            professional performing a gastrointestinal                            endoscopic service that sedation supports,                            requiring the presence of an independent trained                            observer to assist in the monitoring of the                            patient's level of consciousness and physiological                             status; initial 15 minutes of intra-service time;                            patient age 76 years or older (additional time may                            be reported with [phone], as appropriate) Diagnosis Code(s):        --- Professional ---                           Z12.11, Encounter for screening for malignant  neoplasm of colon                           D12.3, Benign neoplasm of transverse colon (hepatic                            flexure or splenic flexure)                           D12.0, Benign neoplasm of cecum                           K64.8, Other hemorrhoids                           Q43.8, Other specified congenital malformations of                            intestine CPT copyright 2018 [employer]. All rights reserved. The codes documented in this report are preliminary and upon coder review may  be revised to meet current compliance requirements. Barney Drain, MD Barney Drain MD, MD 10/01/2018 11:38:52 AM This report has been signed electronically. Number of Addenda: 0

## 2018-10-01 NOTE — H&P (Addendum)
Primary Care Physician:  Monico Blitz, MD Primary Gastroenterologist:  Dr. Oneida Alar  Pre-Procedure History & Physical: HPI:  Darlene Crawford is a 78 y.o. female here for POSITIVE COLOGUARD/hematemesis.  Past Medical History:  Diagnosis Date  . Anxiety and depression   . Hypercholesterolemia   . Hypertension   . Hypothyroidism   . Macular degeneration    OD  . Osteoarthritis   . Seasonal allergies     Past Surgical History:  Procedure Laterality Date  . TUBAL LIGATION    . UTERINE FIBROID SURGERY      Prior to Admission medications   Medication Sig Start Date End Date Taking? Authorizing Provider  ARTIFICIAL TEAR SOLUTION OP Place 1 drop into both eyes daily as needed (dry eyes).   Yes [provider]  atorvastatin (LIPITOR) 10 MG tablet Take 10 mg by mouth at bedtime.  11/30/17  Yes [provider]  Cholecalciferol (VITAMIN D) 50 MCG (2000 UT) tablet Take 4,000 Units by mouth at bedtime.    Yes [provider]  Cyanocobalamin (B-12 PO) Take 1 Dose by mouth 2 (two) times a week.   Yes [provider]  hydrochlorothiazide (HYDRODIURIL) 25 MG tablet Take 25 mg by mouth daily.  09/25/17  Yes [provider]  levothyroxine (SYNTHROID, LEVOTHROID) 100 MCG tablet Take 100 mcg by mouth daily before breakfast.   Yes [provider]  losartan (COZAAR) 100 MG tablet Take 100 mg by mouth daily.  09/25/17  Yes [provider]  Melatonin 10 MG CAPS Take 10 mg by mouth at bedtime.   Yes [provider]  polyethylene glycol-electrolytes (TRILYTE) 420 g solution Take 4,000 mLs by mouth as directed. 07/25/18  Yes ,  L, MD  sertraline (ZOLOFT) 50 MG tablet Take 50 mg by mouth daily.   Yes [provider]  acidophilus (RISAQUAD) CAPS capsule Take 1 capsule by mouth daily.    [provider]  fluticasone (FLONASE) 50 MCG/ACT nasal spray Place 1 spray into both nostrils daily as needed for allergies or  rhinitis.    [provider]  loratadine (CLARITIN) 10 MG tablet Take 10 mg by mouth daily as needed for allergies.     [provider]  Multiple Vitamins-Minerals (PRESERVISION AREDS 2 PO) Take 1 tablet by mouth at bedtime.     [provider]  sodium chloride (OCEAN) 0.65 % SOLN nasal spray Place 1 spray into both nostrils as needed for congestion.    [provider]    Allergies as of 07/25/2018 - Review Complete 07/25/2018  Allergen Reaction Noted  . Vicodin [hydrocodone-acetaminophen]  07/25/2018    Family History  Problem Relation Age of Onset  . Cancer Mother        lung  . Stroke Father   . Stroke Brother   . Colon cancer Neg Hx     Social History   Socioeconomic History  . Marital status: Married    Spouse name: Not on file  . Number of children: Not on file  . Years of education: Not on file  . Highest education level: Not on file  Occupational History  . Not on file  Social Needs  . Financial resource strain: Not on file  . Food insecurity:    Worry: Not on file    Inability: Not on file  . Transportation needs:    Medical: Not on file    Non-medical: Not on file  Tobacco Use  . Smoking status: Former Smoker  Types: Cigarettes  . Smokeless tobacco: Never Used  Substance and Sexual Activity  . Alcohol use: No    Frequency: Never  . Drug use: No  . Sexual activity: Not on file  Lifestyle  . Physical activity:    Days per week: Not on file    Minutes per session: Not on file  . Stress: Not on file  Relationships  . Social connections:    Talks on phone: Not on file    Gets together: Not on file    Attends religious service: Not on file    Active member of club or organization: Not on file    Attends meetings of clubs or organizations: Not on file    Relationship status: Not on file  . Intimate partner violence:    Fear of current or ex partner: Not on file    Emotionally abused: Not on file    Physically  abused: Not on file    Forced sexual activity: Not on file  Other Topics Concern  . Not on file  Social History Narrative  . Not on file    Review of Systems: See HPI, otherwise negative ROS   Physical Exam: BP 104/74   Pulse 74   Temp 98.6 F (37 C) (Oral)   Resp 19   Ht 5\' 6"  (1.676 m)   Wt 90.3 kg   SpO2 100%   BMI 32.12 kg/m  General:   Alert,  pleasant and cooperative in NAD Head:  Normocephalic and atraumatic. Neck:  Supple; Lungs:  Clear throughout to auscultation.    Heart:  Regular rate and rhythm. Abdomen:  Soft, nontender and nondistended. Normal bowel sounds, without guarding, and without rebound.   Neurologic:  Alert and  oriented x4;  grossly normal neurologically.  Impression/Plan:    POSITIVE COLOGUARD/hematemesis  Plan:  1. TCS/EGD TODAY DISCUSSED PROCEDURE, BENEFITS, & RISKS: < 1% chance of medication reaction, bleeding, perforation, or rupture of spleen/liver.

## 2018-10-01 NOTE — Discharge Instructions (Signed)
You had 4 polyps removed. You have small internal hemorrhoids. WHEN YOU THREW UP YOU TORE YOUR ESOPHAGUS. I INJECTED EPINEPHRINE AND CAUTERIZED IT. You have mild gastritis/DUODENITIS, ONE STOMACH POLYPS, & a SMALL HIATAL HERNIA. I biopsied your stomach.   DRINK WATER TO KEEP YOUR URINE LIGHT YELLOW.  CONTINUE YOUR WEIGHT LOSS EFFORTS. YOUR BODY MASS INDEX IS OVER 30 WHICH MEANS YOU ARE OBESE. OBESITY IS ASSOCIATED WITH AN INCREASED FOR CIRRHOSIS AND ALL CANCERS, INCLUDING ESOPHAGEAL AND COLON CANCER. A WEIGHT OF 182 LBS OR LESS  WILL GET YOUR BODY MASS INDEX(BMI) UNDER 30.  FOLLOW A HIGH FIBER/LOW FAT DIET. AVOID ITEMS THAT CAUSE BLOATING. SEE INFO BELOW.  YOUR BIOPSY RESULTS WILL BE BACK IN 5 BUSINESS DAYS.  We do not routinely screen for polyps after the age of 91.   ENDOSCOPY Care After Read the instructions outlined below and refer to this sheet in the next week. These discharge instructions provide you with general information on caring for yourself after you leave the hospital. While your treatment has been planned according to the most current medical practices available, unavoidable complications occasionally occur. If you have any problems or questions after discharge, call DR. Krisha Beegle, [phone].  ACTIVITY  You may resume your regular activity, but move at a slower pace for the next 24 hours.   Take frequent rest periods for the next 24 hours.   Walking will help get rid of the air and reduce the bloated feeling in your belly (abdomen).   No driving for 24 hours (because of the medicine (anesthesia) used during the test).   You may shower.   Do not sign any important legal documents or operate any machinery for 24 hours (because of the anesthesia used during the test).    NUTRITION  Drink plenty of fluids.   You may resume your normal diet as instructed by your doctor.   Begin with a light meal and progress to your normal diet. Heavy or fried foods are harder to  digest and may make you feel sick to your stomach (nauseated).   Avoid alcoholic beverages for 24 hours or as instructed.    MEDICATIONS  You may resume your normal medications.   WHAT YOU CAN EXPECT TODAY  Some feelings of bloating in the abdomen.   Passage of more gas than usual.   Spotting of blood in your stool or on the toilet paper  .  IF YOU HAD POLYPS REMOVED DURING THE ENDOSCOPY:  Eat a soft diet IF YOU HAVE NAUSEA, BLOATING, ABDOMINAL PAIN, OR VOMITING.    FINDING OUT THE RESULTS OF YOUR TEST Not all test results are available during your visit. DR. Oneida Alar WILL CALL YOU WITHIN 7 DAYS OF YOUR PROCEDUE WITH YOUR RESULTS. Do not assume everything is normal if you have not heard from DR. Jillane Po IN ONE WEEK, CALL HER OFFICE AT [phone].  SEEK IMMEDIATE MEDICAL ATTENTION AND CALL THE OFFICE: [phone] IF:  You have more than a spotting of blood in your stool.   Your belly is swollen (abdominal distention).   You are nauseated or vomiting.   You have a temperature over 101F.   You have abdominal pain or discomfort that is severe or gets worse throughout the day.   Gastritis  Gastritis is an inflammation (the body's way of reacting to injury and/or infection) of the stomach. It is often caused by viral or bacterial (germ) infections. It can also be caused BY ASPIRIN, BC/GOODY POWDER'S, (IBUPROFEN) MOTRIN, OR ALEVE (NAPROXEN),  chemicals (including alcohol), SPICY FOODS, and medications. This illness may be associated with generalized malaise (feeling tired, not well), UPPER ABDOMINAL STOMACH cramps, and fever. One common bacterial cause of gastritis is an organism known as H. Pylori. This can be treated with antibiotics.    Hiatal Hernia A hiatal hernia occurs when a part of the stomach slides above the diaphragm. The diaphragm is the thin muscle separating the belly (abdomen) from the chest. A hiatal hernia can be something you are born with or develop over  time. Hiatal hernias may allow stomach acid to flow back into your esophagus, the tube which carries food from your mouth to your stomach. If this acid causes problems it is called GERD (gastro-esophageal reflux disease).    High-Fiber Diet A high-fiber diet changes your normal diet to include more whole grains, legumes, fruits, and vegetables. Changes in the diet involve replacing refined carbohydrates with unrefined foods. The calorie level of the diet is essentially unchanged. The Dietary Reference Intake (recommended amount) for adult males is 38 grams per day. For adult females, it is 25 grams per day. Pregnant and lactating women should consume 28 grams of fiber per day. Fiber is the intact part of a plant that is not broken down during digestion. Functional fiber is fiber that has been isolated from the plant to provide a beneficial effect in the body.  PURPOSE  Increase stool bulk.   Ease and regulate bowel movements.   Lower cholesterol.   REDUCE RISK OF COLON CANCER  INDICATIONS THAT YOU NEED MORE FIBER  Constipation and hemorrhoids.   Uncomplicated diverticulosis (intestine condition) and irritable bowel syndrome.   Weight management.   As a protective measure against hardening of the arteries (atherosclerosis), diabetes, and cancer.   GUIDELINES FOR INCREASING FIBER IN THE DIET  Start adding fiber to the diet slowly. A gradual increase of about 5 more grams (2 slices of whole-wheat bread, 2 servings of most fruits or vegetables, or 1 bowl of high-fiber cereal) per day is best. Too rapid an increase in fiber may result in constipation, flatulence, and bloating.   Drink enough water and fluids to keep your urine clear or pale yellow. Water, juice, or caffeine-free drinks are recommended. Not drinking enough fluid may cause constipation.   Eat a variety of high-fiber foods rather than one type of fiber.   Try to increase your intake of fiber through using high-fiber foods  rather than fiber pills or supplements that contain small amounts of fiber.   The goal is to change the types of food eaten. Do not supplement your present diet with high-fiber foods, but replace foods in your present diet.   INCLUDE A VARIETY OF FIBER SOURCES  Replace refined and processed grains with whole grains, canned fruits with fresh fruits, and incorporate other fiber sources. White rice, white breads, and most bakery goods contain little or no fiber.   Brown whole-grain rice, buckwheat oats, and many fruits and vegetables are all good sources of fiber. These include: broccoli, Brussels sprouts, cabbage, cauliflower, beets, sweet potatoes, white potatoes (skin on), carrots, tomatoes, eggplant, squash, berries, fresh fruits, and dried fruits.   Cereals appear to be the richest source of fiber. Cereal fiber is found in whole grains and bran. Bran is the fiber-rich outer coat of cereal grain, which is largely removed in refining. In whole-grain cereals, the bran remains. In breakfast cereals, the largest amount of fiber is found in those with "bran" in their names. The fiber content  is sometimes indicated on the label.   You may need to include additional fruits and vegetables each day.   In baking, for 1 cup white flour, you may use the following substitutions:   1 cup whole-wheat flour minus 2 tablespoons.   1/2 cup white flour plus 1/2 cup whole-wheat flour.    Low-Fat Diet BREADS, CEREALS, PASTA, RICE, DRIED PEAS, AND BEANS These products are high in carbohydrates and most are low in fat. Therefore, they can be increased in the diet as substitutes for fatty foods. They too, however, contain calories and should not be eaten in excess. Cereals can be eaten for snacks as well as for breakfast.  Include foods that contain fiber (fruits, vegetables, whole grains, and legumes). Research shows that fiber may lower blood cholesterol levels, especially the water-soluble fiber found in fruits,  vegetables, oat products, and legumes. FRUITS AND VEGETABLES It is good to eat fruits and vegetables. Besides being sources of fiber, both are rich in vitamins and some minerals. They help you get the daily allowances of these nutrients. Fruits and vegetables can be used for snacks and desserts. MEATS Limit lean meat, chicken, [place], and fish to no more than 6 ounces per day. Beef, Pork, and Lamb Use lean cuts of beef, pork, and lamb. Lean cuts include:  Extra-lean ground beef.  Arm roast.  Sirloin tip.  Center-cut ham.  Round steak.  Loin chops.  Rump roast.  Tenderloin.  Trim all fat off the outside of meats before cooking. It is not necessary to severely decrease the intake of red meat, but lean choices should be made. Lean meat is rich in protein and contains a highly absorbable form of iron. Premenopausal women, in particular, should avoid reducing lean red meat because this could increase the risk for low red blood cells (iron-deficiency anemia).  Chicken and [place] These are good sources of protein. The fat of poultry can be reduced by removing the skin and underlying fat layers before cooking. Chicken and [place] can be substituted for lean red meat in the diet. Poultry should not be fried or covered with high-fat sauces. Fish and Shellfish Fish is a good source of protein. Shellfish contain cholesterol, but they usually are low in saturated fatty acids. The preparation of fish is important. Like chicken and [place], they should not be fried or covered with high-fat sauces. EGGS Egg whites contain no fat or cholesterol. They can be eaten often. Try 1 to 2 egg whites instead of whole eggs in recipes or use egg substitutes that do not contain yolk.  MILK AND DAIRY PRODUCTS Use skim or 1% milk instead of 2% or whole milk. Decrease whole milk, natural, and processed cheeses. Use nonfat or low-fat (2%) cottage cheese or low-fat cheeses made from vegetable oils. Choose nonfat or low-fat (1  to 2%) yogurt. Experiment with evaporated skim milk in recipes that call for heavy cream. Substitute low-fat yogurt or low-fat cottage cheese for sour cream in dips and salad dressings. Have at least 2 servings of low-fat dairy products, such as 2 glasses of skim (or 1%) milk each day to help get your daily calcium intake.  FATS AND OILS Butterfat, lard, and beef fats are high in saturated fat and cholesterol. These should be avoided.Vegetable fats do not contain cholesterol. AVOID coconut oil, palm oil, and palm kernel oil, WHICH are very high in saturated fats. These should be limited. These fats are often used in bakery goods, processed foods, popcorn, oils, and nondairy creamers.  Vegetable shortenings and some peanut butters contain hydrogenated oils, which are also saturated fats. Read the labels on these foods and check for saturated vegetable oils.  Desirable liquid vegetable oils are corn oil, cottonseed oil, olive oil, canola oil, safflower oil, soybean oil, and sunflower oil. Peanut oil is not as good, but small amounts are acceptable. Buy a heart-healthy tub margarine that has no partially hydrogenated oils in the ingredients. AVOID Mayonnaise and salad dressings often are made from unsaturated fats.  OTHER EATING TIPS Snacks  Most sweets should be limited as snacks. They tend to be rich in calories and fats, and their caloric content outweighs their nutritional value. Some good choices in snacks are graham crackers, melba toast, soda crackers, bagels (no egg), English muffins, fruits, and vegetables. These snacks are preferable to snack crackers, [place] fries, and chips. Popcorn should be air-popped or cooked in small amounts of liquid vegetable oil.  Desserts Eat fruit, low-fat yogurt, and fruit ices instead of pastries, cake, and cookies. Sherbet, angel food cake, gelatin dessert, frozen low-fat yogurt, or other frozen products that do not contain saturated fat (pure fruit juice bars,  frozen ice pops) are also acceptable.   COOKING METHODS Choose those methods that use little or no fat. They include: Poaching.  Braising.  Steaming.  Grilling.  Baking.  Stir-frying.  Broiling.  Microwaving.  Foods can be cooked in a nonstick pan without added fat, or use a nonfat cooking spray in regular cookware. Limit fried foods and avoid frying in saturated fat. Add moisture to lean meats by using water, broth, cooking wines, and other nonfat or low-fat sauces along with the cooking methods mentioned above. Soups and stews should be chilled after cooking. The fat that forms on top after a few hours in the refrigerator should be skimmed off. When preparing meals, avoid using excess salt. Salt can contribute to raising blood pressure in some people.  EATING AWAY FROM HOME Order entres, potatoes, and vegetables without sauces or butter. When meat exceeds the size of a deck of cards (3 to 4 ounces), the rest can be taken home for another meal. Choose vegetable or fruit salads and ask for low-calorie salad dressings to be served on the side. Use dressings sparingly. Limit high-fat toppings, such as bacon, crumbled eggs, cheese, sunflower seeds, and olives. Ask for heart-healthy tub margarine instead of butter.

## 2018-10-03 ENCOUNTER — Telehealth: Payer: Self-pay | Admitting: Gastroenterology

## 2018-10-03 NOTE — Telephone Encounter (Signed)
Pt is aware.  

## 2018-10-03 NOTE — Telephone Encounter (Signed)
Please call pt. She had 4 simple adenomas removed. Please call pt. HER stomach Bx shows mild gastritis.    DRINK WATER TO KEEP YOUR URINE LIGHT YELLOW. CONTINUE YOUR WEIGHT LOSS EFFORTS.  A WEIGHT OF 182 LBS OR LESS  WILL GET YOUR BODY MASS INDEX(BMI) UNDER 30. FOLLOW A HIGH FIBER/LOW FAT DIET. AVOID ITEMS THAT CAUSE BLOATING.  We do not routinely screen for polyps after the age of 30.

## 2018-10-05 ENCOUNTER — Encounter (HOSPITAL_COMMUNITY): Payer: Self-pay | Admitting: Gastroenterology

## 2018-10-15 NOTE — Progress Notes (Signed)
[facility] Note  10/16/2018      CHIEF COMPLAINT Patient presents for Retina Follow Up   HISTORY OF PRESENT ILLNESS: Darlene Crawford is a 78 y.o. female who presents to the clinic today for:   HPI    Retina Follow Up    Patient presents with  Wet AMD.  In right eye.  This started 4 weeks ago.  Severity is moderate.  Duration of 4 weeks.  Since onset it is gradually improving.  I, the attending physician,  performed the HPI with the patient and updated documentation appropriately.          Comments    Patient here for 4 week retina follow up for exu ARMD OD. Patient states vision seems a little bit better. No eye pain.       Last edited by Bernarda Caffey, MD on 10/16/2018  2:25 PM. (History)      Referring physician: Monico Blitz, MD [place], [place] [address]  HISTORICAL INFORMATION:   Selected notes from the MEDICAL RECORD NUMBER Referred by Dr. Leander Rams for concern of ARMD OD with progression to CNVM vs CSR;  LEE- 01.29.19 (R. Davis) [BCVA OD: 20/100 OS: 20/30] Ocular Hx- AMD OU, cataract OU, DES OU, HTN retinopathy OU, WWP OU, choroidal nevus OD;  PMH- elevated chol, HTN, hyperthyoridism    CURRENT MEDICATIONS: Current Outpatient Medications (Ophthalmic Drugs)  Medication Sig  . ARTIFICIAL TEAR SOLUTION OP Place 1 drop into both eyes daily as needed (dry eyes).   Current Facility-Administered Medications (Ophthalmic Drugs)  Medication Route  . aflibercept (EYLEA) SOLN 2 mg Intravitreal  . aflibercept (EYLEA) SOLN 2 mg Intravitreal  . aflibercept (EYLEA) SOLN 2 mg Intravitreal   Current Outpatient Medications (Other)  Medication Sig  . acidophilus (RISAQUAD) CAPS capsule Take 1 capsule by mouth daily.  Marland Kitchen atorvastatin (LIPITOR) 10 MG tablet Take 10 mg by mouth at bedtime.   . Cholecalciferol (VITAMIN D) 50 MCG (2000 UT) tablet Take 4,000 Units by mouth at bedtime.   . Cyanocobalamin (B-12 PO) Take 1 Dose by mouth 2 (two) times a  week.  . fluticasone (FLONASE) 50 MCG/ACT nasal spray Place 1 spray into both nostrils daily as needed for allergies or rhinitis.  . hydrochlorothiazide (HYDRODIURIL) 25 MG tablet Take 25 mg by mouth daily.   Marland Kitchen levothyroxine (SYNTHROID, LEVOTHROID) 100 MCG tablet Take 100 mcg by mouth daily before breakfast.  . loratadine (CLARITIN) 10 MG tablet Take 10 mg by mouth daily as needed for allergies.   Marland Kitchen losartan (COZAAR) 100 MG tablet Take 100 mg by mouth daily.   . Melatonin 10 MG CAPS Take 10 mg by mouth at bedtime.  . Multiple Vitamins-Minerals (PRESERVISION AREDS 2 PO) Take 1 tablet by mouth at bedtime.   . sertraline (ZOLOFT) 50 MG tablet Take 50 mg by mouth daily.  . sodium chloride (OCEAN) 0.65 % SOLN nasal spray Place 1 spray into both nostrils as needed for congestion.   Current Facility-Administered Medications (Other)  Medication Route  . Bevacizumab (AVASTIN) SOLN 1.25 mg Intravitreal  . Bevacizumab (AVASTIN) SOLN 1.25 mg Intravitreal  . Bevacizumab (AVASTIN) SOLN 1.25 mg Intravitreal      REVIEW OF SYSTEMS: ROS    Positive for: Musculoskeletal, Cardiovascular, Eyes   Negative for: Constitutional, Gastrointestinal, Neurological, Skin, Genitourinary, HENT, Endocrine, Respiratory, Psychiatric, Allergic/Imm, Heme/Lymph   Last edited by Theodore Demark on 10/16/2018  2:09 PM. (History)       ALLERGIES  Allergies  Allergen Reactions  . Trilyte [Peg 3350-Kcl-Na Bicarb-Nacl]     HEMATEMESIS  . Vicodin [Hydrocodone-Acetaminophen] Nausea And Vomiting    PAST MEDICAL HISTORY Past Medical History:  Diagnosis Date  . Anxiety and depression   . Hypercholesterolemia   . Hypertension   . Hypothyroidism   . Macular degeneration    OD  . Osteoarthritis   . Seasonal allergies    Past Surgical History:  Procedure Laterality Date  . BIOPSY  10/01/2018   Procedure: BIOPSY;  Surgeon: Danie Binder, MD;  [facility]: [facility];  Service: Endoscopy;;  gastric bx & gastric polyp   . COLONOSCOPY N/A 10/01/2018   Procedure: COLONOSCOPY;  Surgeon: Danie Binder, MD;  Location: [facility];  Service: Endoscopy;  Laterality: N/A;  10:30am  . ESOPHAGOGASTRODUODENOSCOPY N/A 10/01/2018   Procedure: ESOPHAGOGASTRODUODENOSCOPY (EGD);  Surgeon: Danie Binder, MD;  Location: [facility];  Service: Endoscopy;  Laterality: N/A;  . POLYPECTOMY  10/01/2018   Procedure: POLYPECTOMY;  Surgeon: Danie Binder, MD;  Location: [facility];  Service: Endoscopy;;  colon   . TUBAL LIGATION    . UTERINE FIBROID SURGERY      FAMILY HISTORY Family History  Problem Relation Age of Onset  . Cancer Mother        lung  . Stroke Father   . Stroke Brother   . Colon cancer Neg Hx     SOCIAL HISTORY Social History   Tobacco Use  . Smoking status: Former Smoker    Types: Cigarettes  . Smokeless tobacco: Never Used  Substance Use Topics  . Alcohol use: No    Frequency: Never  . Drug use: No         OPHTHALMIC EXAM:  Base Eye Exam    Visual Acuity (Snellen - Linear)      Right Left   Dist cc 20/40 -1 20/20   Dist ph cc 20/40 +2    Correction:  Glasses       Tonometry (Tonopen, 2:06 PM)      Right Left   Pressure 13 12       Pupils      Dark Light Shape React APD   Right 3 2 Round Brisk None   Left 3 2 Round Brisk None       Visual Fields (Counting fingers)      Left Right    Full Full       Extraocular Movement      Right Left    Full, Ortho Full, Ortho       Neuro/Psych    Oriented x3:  Yes   Mood/Affect:  Normal       Dilation    Both eyes:  1.0% Mydriacyl, 2.5% Phenylephrine @ 2:06 PM        Slit Lamp and Fundus Exam    Slit Lamp Exam      Right Left   Lids/Lashes Dermatochalasis - upper lid Dermatochalasis - upper lid   Conjunctiva/Sclera White and quiet White and quiet   Cornea Arcus, early nasal band K Arcus, early nasal band K   Anterior Chamber moderate depth, with narrow angle temporally moderate depth, with narrow angle temporally    Iris Round and well dilated Round and well dilated   Lens 2+ Nuclear sclerosis, 2+ Cortical cataract 2+ Nuclear sclerosis, 2+ Cortical cataract   Vitreous Vitreous syneresis, Posterior vitreous detachment, Weiss ring Vitreous syneresis       Fundus Exam  Right Left   Disc Pink and Sharp Pink and Sharp, Tilted disc   C/D Ratio 0.3 0.4   Macula Blunted foveal reflex, Interval improvement in SRF, drusen, RPE mottling and clumping Flat, good foveal reflex, Retinal pigment epithelial mottling, superior para-foveal focal area of RPE atrophy -- stable, rare drusen, No heme or edema   Vessels Mild AV crossing changes, Tortuous, Vascular attenuation Copper wiring, AV crossing changes, mildly Tortuous   Periphery Attached, peripheral drusen / RPE changes nasally, peripheral cystoid degeneration Patch of Lattice degeneration at 0600, scattered peripheral drusen, peripheral cystoid degeneration        Refraction    Wearing Rx      Sphere Cylinder Axis Add   Right +2.00 +1.25 158 +2.50   Left +2.00 +0.75 002 +2.50   Type:  PAL          IMAGING AND PROCEDURES  Imaging and Procedures for 12/11/17  OCT, Retina - OU - Both Eyes       Right Eye Quality was good. Central Foveal Thickness: 273. Progression has improved. Findings include subretinal fluid, no IRF, retinal drusen , pigment epithelial detachment, subretinal hyper-reflective material, normal foveal contour, outer retinal atrophy (Interval improvement in SRF).   Left Eye Quality was good. Central Foveal Thickness: 276. Progression has been stable. Findings include normal foveal contour, no IRF, no SRF, retinal drusen .   Notes *Images captured and stored on drive  Diagnosis / Impression:  OD: interval improvement in SRF; shallow SRHM OS: NFP, No IRF/SRF  Clinical management:  See below  Abbreviations: NFP - Normal foveal profile. CME - cystoid macular edema. PED - pigment epithelial detachment. IRF - intraretinal fluid.  SRF - subretinal fluid. EZ - ellipsoid zone. ERM - epiretinal membrane. ORA - outer retinal atrophy. ORT - outer retinal tubulation. SRHM - subretinal hyper-reflective material         Intravitreal Injection, Pharmacologic Agent - OD - Right Eye       Time Out 10/16/2018. 3:10 PM. Confirmed correct patient, procedure, site, and patient consented.   Anesthesia Topical anesthesia was used. Anesthetic medications included Lidocaine 2%, Proparacaine 0.5%.   Procedure Preparation included 5% betadine to ocular surface, eyelid speculum. A 30 gauge needle was used.   Injection:  2 mg aflibercept Alfonse Flavors) SOLN   NDC: M7179715, Lot: [id number], Expiration date: 08/28/2019   Route: Intravitreal, Site: Right Eye, Waste: 0 mg  Post-op Post injection exam found visual acuity of at least counting fingers. The patient tolerated the procedure well. There were no complications. The patient received written and verbal post procedure care education.                 ASSESSMENT/PLAN:    ICD-10-CM   1. Exudative age-related macular degeneration of right eye with active choroidal neovascularization (HCC) H35.3211 Intravitreal Injection, Pharmacologic Agent - OD - Right Eye    aflibercept (EYLEA) SOLN 2 mg  2. 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 Retinal edema H35.81 OCT, Retina - OU - Both Eyes  4. Essential hypertension I10   5. Hypertensive retinopathy of both eyes H35.033   6. 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 Posterior vitreous detachment of right eye H43.811   8. 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 Exudative age related macular degeneration vs CSCR OD    - repeat FA (03.02.19) without leakage / pooling into area of SRF  - review of systems reveals  significant stressors in life, but no exogenous steroid use  - discussed treatment with anti-VEGF vs 25mg  of Eplerenone  - pt unable to afford Eplerenone   - S/P IVA  #1 OD (09.27.19),  #2 (10.25.19), #3 (11.22.19) -- minimal response  - pt approved for [employer] and IVE  - S/P IVE OD #1 (12.20.19), #2 (01.20.20)  - today, VA stable at 20/40 OD and pt reports no subjective change in vision -- still with central blur  - OCT today with interval improvement in SRF  - recommend IVE OD #3 today, 02.18.20 -- with extension to 6 wks  - pt wishes to proceed  - RBA of procedure discussed, questions answered  - informed consent obtained and signed  - see procedure note  - [email or web address] benefits investigation initiated -- approved for 2020  - f/u 6 weeks -- DFE/OCT/ possible injection  4,5. Hypertensive retinopathy OU - discussed importance of tight BP control - stable - monitor  6. Lattice degeneration OS- - patch of inferior lattice OS -- no RT, holes or SRF - discussed findings, prognosis, and treatment options including observation - monitor  7. PVD / vitreous syneresis OD-   Discussed findings and prognosis  No RT or RD on 360 peripheral exam  Reviewed s/s of RT/RD  Strict return precautions for any such RT/RD signs/symptoms   8. Combined form age-related cataract OU-  - The symptoms of cataract, surgical options, and treatments and risks were discussed with patient. - discussed diagnosis and progression - not yet visually significant - monitor for now   Ophthalmic Meds Ordered this visit:  Meds ordered this encounter  Medications  . aflibercept (EYLEA) SOLN 2 mg       Return in about 6 weeks (around 11/27/2018) for f/u exu ARMD OD, DFE, OCT.  There are no Patient Instructions on file for this visit.   Explained the diagnoses, plan, and follow up with the patient and they expressed understanding.  Patient expressed understanding of the importance of proper follow up care.   This document serves as a record of services personally performed by Gardiner Sleeper, MD, PhD. It was created on their behalf by Ernest Mallick, OA, an ophthalmic assistant.  The creation of this record is the provider's dictation and/or activities during the visit.    Electronically signed by: Ernest Mallick, OA  02.17.2020 5:20 PM    Gardiner Sleeper, M.D., Ph.D. Diseases & Surgery of the Retina and Vitreous [facility]  I have reviewed the above documentation for accuracy and completeness, and I agree with the above. Gardiner Sleeper, M.D., Ph.D. 10/16/18 5:21 PM     Abbreviations: M myopia (nearsighted); A astigmatism; H hyperopia (farsighted); P presbyopia; Mrx spectacle prescription;  CTL contact lenses; OD right eye; OS left eye; OU both eyes  XT exotropia; ET esotropia; PEK punctate epithelial keratitis; PEE punctate epithelial erosions; DES dry eye syndrome; MGD meibomian gland dysfunction; ATs artificial tears; PFAT's preservative free artificial tears; [place] nuclear sclerotic cataract; PSC posterior subcapsular cataract; ERM epi-retinal membrane; PVD posterior vitreous detachment; RD retinal detachment; DM diabetes mellitus; DR diabetic retinopathy; NPDR non-proliferative diabetic retinopathy; PDR proliferative diabetic retinopathy; CSME clinically significant macular edema; DME diabetic macular edema; dbh dot blot hemorrhages; CWS cotton wool spot; POAG primary open angle glaucoma; C/D cup-to-disc ratio; HVF humphrey visual field; GVF goldmann visual field; OCT optical coherence tomography; IOP intraocular pressure; BRVO Branch retinal vein occlusion; CRVO central retinal vein occlusion; CRAO central retinal artery occlusion;  BRAO branch retinal artery occlusion; RT retinal tear; SB scleral buckle; PPV pars plana vitrectomy; VH Vitreous hemorrhage; PRP panretinal laser photocoagulation; IVK intravitreal kenalog; VMT vitreomacular traction; MH Macular hole;  NVD neovascularization of the disc; NVE neovascularization elsewhere; AREDS age related eye disease study; ARMD age related macular degeneration; POAG primary open angle glaucoma; EBMD  epithelial/anterior basement membrane dystrophy; ACIOL anterior chamber intraocular lens; IOL intraocular lens; PCIOL posterior chamber intraocular lens; Phaco/IOL phacoemulsification with intraocular lens placement; [place] photorefractive keratectomy; LASIK laser assisted in situ keratomileusis; HTN hypertension; DM diabetes mellitus; COPD chronic obstructive pulmonary disease

## 2018-10-16 ENCOUNTER — Encounter (INDEPENDENT_AMBULATORY_CARE_PROVIDER_SITE_OTHER): Payer: Self-pay | Admitting: Ophthalmology

## 2018-10-16 ENCOUNTER — Ambulatory Visit (INDEPENDENT_AMBULATORY_CARE_PROVIDER_SITE_OTHER): Payer: Medicare HMO | Admitting: Ophthalmology

## 2018-10-16 DIAGNOSIS — H3581 Retinal edema: Secondary | ICD-10-CM

## 2018-10-16 DIAGNOSIS — H43811 Vitreous degeneration, right eye: Secondary | ICD-10-CM

## 2018-10-16 DIAGNOSIS — H35412 Lattice degeneration of retina, left eye: Secondary | ICD-10-CM | POA: Diagnosis not present

## 2018-10-16 DIAGNOSIS — I10 Essential (primary) hypertension: Secondary | ICD-10-CM | POA: Diagnosis not present

## 2018-10-16 DIAGNOSIS — H35033 Hypertensive retinopathy, bilateral: Secondary | ICD-10-CM

## 2018-10-16 DIAGNOSIS — H353211 Exudative age-related macular degeneration, right eye, with active choroidal neovascularization: Secondary | ICD-10-CM | POA: Diagnosis not present

## 2018-10-16 DIAGNOSIS — H35711 Central serous chorioretinopathy, right eye: Secondary | ICD-10-CM

## 2018-10-16 DIAGNOSIS — H25813 Combined forms of age-related cataract, bilateral: Secondary | ICD-10-CM

## 2018-10-16 MED ORDER — AFLIBERCEPT 2MG/0.05ML IZ SOLN FOR KALEIDOSCOPE
2.0000 mg | INTRAVITREAL | Status: AC
  Administered 2018-10-16: 2 mg via INTRAVITREAL

## 2018-10-18 DIAGNOSIS — I10 Essential (primary) hypertension: Secondary | ICD-10-CM | POA: Diagnosis not present

## 2018-10-18 DIAGNOSIS — Z6832 Body mass index (BMI) 32.0-32.9, adult: Secondary | ICD-10-CM | POA: Diagnosis not present

## 2018-10-18 DIAGNOSIS — E7800 Pure hypercholesterolemia, unspecified: Secondary | ICD-10-CM | POA: Diagnosis not present

## 2018-10-18 DIAGNOSIS — Z299 Encounter for prophylactic measures, unspecified: Secondary | ICD-10-CM | POA: Diagnosis not present

## 2018-11-01 DIAGNOSIS — Z6831 Body mass index (BMI) 31.0-31.9, adult: Secondary | ICD-10-CM | POA: Diagnosis not present

## 2018-11-01 DIAGNOSIS — R300 Dysuria: Secondary | ICD-10-CM | POA: Diagnosis not present

## 2018-11-01 DIAGNOSIS — Z299 Encounter for prophylactic measures, unspecified: Secondary | ICD-10-CM | POA: Diagnosis not present

## 2018-11-01 DIAGNOSIS — I10 Essential (primary) hypertension: Secondary | ICD-10-CM | POA: Diagnosis not present

## 2018-12-04 ENCOUNTER — Encounter (INDEPENDENT_AMBULATORY_CARE_PROVIDER_SITE_OTHER): Payer: Medicare HMO | Admitting: Ophthalmology

## 2018-12-13 DIAGNOSIS — Z713 Dietary counseling and surveillance: Secondary | ICD-10-CM | POA: Diagnosis not present

## 2018-12-13 DIAGNOSIS — Z6832 Body mass index (BMI) 32.0-32.9, adult: Secondary | ICD-10-CM | POA: Diagnosis not present

## 2018-12-13 DIAGNOSIS — W57XXXA Bitten or stung by nonvenomous insect and other nonvenomous arthropods, initial encounter: Secondary | ICD-10-CM | POA: Diagnosis not present

## 2018-12-13 DIAGNOSIS — I10 Essential (primary) hypertension: Secondary | ICD-10-CM | POA: Diagnosis not present

## 2018-12-13 DIAGNOSIS — Z299 Encounter for prophylactic measures, unspecified: Secondary | ICD-10-CM | POA: Diagnosis not present

## 2019-01-02 ENCOUNTER — Encounter (INDEPENDENT_AMBULATORY_CARE_PROVIDER_SITE_OTHER): Payer: Medicare HMO | Admitting: Ophthalmology

## 2019-02-05 ENCOUNTER — Encounter (INDEPENDENT_AMBULATORY_CARE_PROVIDER_SITE_OTHER): Payer: Medicare HMO | Admitting: Ophthalmology

## 2019-03-04 DIAGNOSIS — Z299 Encounter for prophylactic measures, unspecified: Secondary | ICD-10-CM | POA: Diagnosis not present

## 2019-03-04 DIAGNOSIS — R69 Illness, unspecified: Secondary | ICD-10-CM | POA: Diagnosis not present

## 2019-03-04 DIAGNOSIS — Z1331 Encounter for screening for depression: Secondary | ICD-10-CM | POA: Diagnosis not present

## 2019-03-04 DIAGNOSIS — Z1339 Encounter for screening examination for other mental health and behavioral disorders: Secondary | ICD-10-CM | POA: Diagnosis not present

## 2019-03-04 DIAGNOSIS — Z79899 Other long term (current) drug therapy: Secondary | ICD-10-CM | POA: Diagnosis not present

## 2019-03-04 DIAGNOSIS — E7800 Pure hypercholesterolemia, unspecified: Secondary | ICD-10-CM | POA: Diagnosis not present

## 2019-03-04 DIAGNOSIS — Z7189 Other specified counseling: Secondary | ICD-10-CM | POA: Diagnosis not present

## 2019-03-04 DIAGNOSIS — Z0000 Encounter for general adult medical examination without abnormal findings: Secondary | ICD-10-CM | POA: Diagnosis not present

## 2019-03-04 DIAGNOSIS — I10 Essential (primary) hypertension: Secondary | ICD-10-CM | POA: Diagnosis not present

## 2019-03-04 DIAGNOSIS — Z6832 Body mass index (BMI) 32.0-32.9, adult: Secondary | ICD-10-CM | POA: Diagnosis not present

## 2019-03-17 NOTE — Progress Notes (Signed)
[facility] Note  03/18/2019      CHIEF COMPLAINT Patient presents for Retina Follow Up   HISTORY OF PRESENT ILLNESS: Darlene Crawford is a 78 y.o. female who presents to the clinic today for:   HPI    Retina Follow Up    Patient presents with  Wet AMD.  In right eye.  Severity is moderate.  Duration of 5 months.  Since onset it is gradually worsening.  I, the attending physician,  performed the HPI with the patient and updated documentation appropriately.          Comments    Patient states vision had improved since last avastin treatment OD on 02.18.20. Had appointment scheduled in April 2020, but patient rescheduled due to [place] pandemic. For the past 2 months, vision is getting worse OD. Vision seems good OS.       Last edited by Bernarda Caffey, MD on 03/18/2019  1:27 PM. (History)      Referring physician: Monico Blitz, MD [place],  [place] [address]  HISTORICAL INFORMATION:   Selected notes from the MEDICAL RECORD NUMBER Referred by Dr. Leander Rams for concern of ARMD OD with progression to CNVM vs CSR;  LEE- 01.29.19 (R. Davis) [BCVA OD: 20/100 OS: 20/30] Ocular Hx- AMD OU, cataract OU, DES OU, HTN retinopathy OU, WWP OU, choroidal nevus OD;  PMH- elevated chol, HTN, hyperthyoridism    CURRENT MEDICATIONS: Current Outpatient Medications (Ophthalmic Drugs)  Medication Sig  . ARTIFICIAL TEAR SOLUTION OP Place 1 drop into both eyes daily as needed (dry eyes).   Current Facility-Administered Medications (Ophthalmic Drugs)  Medication Route  . aflibercept (EYLEA) SOLN 2 mg Intravitreal  . aflibercept (EYLEA) SOLN 2 mg Intravitreal  . aflibercept (EYLEA) SOLN 2 mg Intravitreal   Current Outpatient Medications (Other)  Medication Sig  . atorvastatin (LIPITOR) 10 MG tablet Take 10 mg by mouth at bedtime.   . Cholecalciferol (VITAMIN D) 50 MCG (2000 UT) tablet Take 4,000 Units by mouth at bedtime.   . Cyanocobalamin (B-12 PO) Take 1 Dose  by mouth 2 (two) times a week.  . fluticasone (FLONASE) 50 MCG/ACT nasal spray Place 1 spray into both nostrils daily as needed for allergies or rhinitis.  . hydrochlorothiazide (HYDRODIURIL) 25 MG tablet Take 25 mg by mouth daily. 1/2 tablet am, 1/2 tablet pm  . levothyroxine (SYNTHROID, LEVOTHROID) 100 MCG tablet Take 100 mcg by mouth daily before breakfast.  . loratadine (CLARITIN) 10 MG tablet Take 10 mg by mouth daily as needed for allergies.   Marland Kitchen losartan (COZAAR) 100 MG tablet Take 100 mg by mouth daily.   . Melatonin 10 MG CAPS Take 10 mg by mouth at bedtime.  . Multiple Vitamins-Minerals (PRESERVISION AREDS 2 PO) Take 1 tablet by mouth at bedtime.   . sertraline (ZOLOFT) 50 MG tablet Take 50 mg by mouth daily.  . sodium chloride (OCEAN) 0.65 % SOLN nasal spray Place 1 spray into both nostrils as needed for congestion.  Marland Kitchen acidophilus (RISAQUAD) CAPS capsule Take 1 capsule by mouth daily.   Current Facility-Administered Medications (Other)  Medication Route  . Bevacizumab (AVASTIN) SOLN 1.25 mg Intravitreal  . Bevacizumab (AVASTIN) SOLN 1.25 mg Intravitreal  . Bevacizumab (AVASTIN) SOLN 1.25 mg Intravitreal      REVIEW OF SYSTEMS: ROS    Positive for: Musculoskeletal, Cardiovascular, Eyes   Negative for: Constitutional, Gastrointestinal, Neurological, Skin, Genitourinary, HENT, Endocrine, Respiratory, Psychiatric, Allergic/Imm, Heme/Lymph   Last edited by  Roselee Nova D on 03/18/2019  1:03 PM. (History)       ALLERGIES Allergies  Allergen Reactions  . Trilyte [Peg 3350-Kcl-Na Bicarb-Nacl]     HEMATEMESIS  . Vicodin [Hydrocodone-Acetaminophen] Nausea And Vomiting    PAST MEDICAL HISTORY Past Medical History:  Diagnosis Date  . Anxiety and depression   . Hypercholesterolemia   . Hypertension   . Hypothyroidism   . Macular degeneration    OD  . Osteoarthritis   . Seasonal allergies    Past Surgical History:  Procedure Laterality Date  . BIOPSY  10/01/2018    Procedure: BIOPSY;  Surgeon: Danie Binder, MD;  [facility]: [facility];  Service: Endoscopy;;  gastric bx & gastric polyp  . COLONOSCOPY N/A 10/01/2018   Procedure: COLONOSCOPY;  Surgeon: Danie Binder, MD;  Location: [facility];  Service: Endoscopy;  Laterality: N/A;  10:30am  . ESOPHAGOGASTRODUODENOSCOPY N/A 10/01/2018   Procedure: ESOPHAGOGASTRODUODENOSCOPY (EGD);  Surgeon: Danie Binder, MD;  Location: [facility];  Service: Endoscopy;  Laterality: N/A;  . POLYPECTOMY  10/01/2018   Procedure: POLYPECTOMY;  Surgeon: Danie Binder, MD;  Location: [facility];  Service: Endoscopy;;  colon   . TUBAL LIGATION    . UTERINE FIBROID SURGERY      FAMILY HISTORY Family History  Problem Relation Age of Onset  . Cancer Mother        lung  . Stroke Father   . Stroke Brother   . Colon cancer Neg Hx     SOCIAL HISTORY Social History   Tobacco Use  . Smoking status: Former Smoker    Types: Cigarettes  . Smokeless tobacco: Never Used  Substance Use Topics  . Alcohol use: No    Frequency: Never  . Drug use: No         OPHTHALMIC EXAM:  Base Eye Exam    Visual Acuity (Snellen - Linear)      Right Left   Dist cc 20/40 +2 20/20   Dist ph cc NI    Correction: Glasses       Tonometry (Tonopen, 1:13 PM)      Right Left   Pressure 16 14       Pupils      Dark Light Shape React APD   Right 3 2 Round Brisk None   Left 3 2 Round Brisk None       Visual Fields (Counting fingers)      Left Right    Full Full       Extraocular Movement      Right Left    Full, Ortho Full, Ortho       Neuro/Psych    Oriented x3: Yes   Mood/Affect: Normal       Dilation    Both eyes: 1.0% Mydriacyl, 2.5% Phenylephrine @ 1:13 PM        Slit Lamp and Fundus Exam    Slit Lamp Exam      Right Left   Lids/Lashes Dermatochalasis - upper lid Dermatochalasis - upper lid   Conjunctiva/Sclera White and quiet White and quiet   Cornea Arcus, early nasal band K, 1+PEE Arcus, early  nasal band K, 1+PEE   Anterior Chamber moderate depth, with narrow angle  moderate depth, with narrow angle temporally   Iris Round and well dilated Round and well dilated   Lens 2-3+ Nuclear sclerosis, 2-3+ Cortical cataract 2+ Nuclear sclerosis, 2+ Cortical cataract   Vitreous Vitreous syneresis, Posterior vitreous detachment,  Weiss ring Vitreous syneresis       Fundus Exam      Right Left   Disc Pink and Sharp Pink and Sharp, Tilted disc   C/D Ratio 0.4 0.4   Macula Blunted foveal reflex, Interval increaset in SRF, drusen, RPE mottling and clumping Flat, good foveal reflex, Retinal pigment epithelial mottling, superior para-foveal focal area of RPE atrophy -- stable, rare drusen, No heme or edema   Vessels Mild AV crossing changes, Tortuous, Vascular attenuation Copper wiring, AV crossing changes, mildly Tortuous   Periphery Attached, peripheral drusen / RPE changes nasally, peripheral cystoid degeneration Patch of Lattice degeneration at 0600, scattered peripheral drusen, peripheral cystoid degeneration        Refraction    Wearing Rx      Sphere Cylinder Axis Add   Right +2.00 +1.25 158 +2.50   Left +2.00 +0.75 002 +2.50   Type: PAL       Manifest Refraction      Sphere Cylinder Axis Dist VA   Right +2.50 +0.75 157 20/30+1   Left              IMAGING AND PROCEDURES  Imaging and Procedures for 12/11/17  OCT, Retina - OU - Both Eyes       Right Eye Quality was good. Central Foveal Thickness: 380. Progression has worsened. Findings include subretinal fluid, no IRF, retinal drusen , pigment epithelial detachment, subretinal hyper-reflective material, normal foveal contour, outer retinal atrophy (Interval increase in SRF).   Left Eye Quality was good. Central Foveal Thickness: 276. Progression has been stable. Findings include normal foveal contour, no IRF, no SRF, retinal drusen .   Notes *Images captured and stored on drive  Diagnosis / Impression:  OD: interval  increase in SRF OS: NFP, No IRF/SRF -- stable from prior  Clinical management:  See below  Abbreviations: NFP - Normal foveal profile. CME - cystoid macular edema. PED - pigment epithelial detachment. IRF - intraretinal fluid. SRF - subretinal fluid. EZ - ellipsoid zone. ERM - epiretinal membrane. ORA - outer retinal atrophy. ORT - outer retinal tubulation. SRHM - subretinal hyper-reflective material         Intravitreal Injection, Pharmacologic Agent - OD - Right Eye       Time Out 03/18/2019. 1:40 PM. Confirmed correct patient, procedure, site, and patient consented.   Anesthesia Topical anesthesia was used. Anesthetic medications included Lidocaine 4%, Proparacaine 0.5%.   Procedure Preparation included 5% betadine to ocular surface, eyelid speculum. A 30 gauge needle was used.   Injection:  2 mg aflibercept Alfonse Flavors) SOLN   NDC: A3590391, Lot: [id number], Expiration date: 10/19/2018   Route: Intravitreal, Site: Right Eye, Waste: 0.05 mL  Post-op Post injection exam found visual acuity of at least counting fingers. The patient tolerated the procedure well. There were no complications. The patient received written and verbal post procedure care education. Post injection medications were not given.                 ASSESSMENT/PLAN:    ICD-10-CM   1. Exudative age-related macular degeneration of right eye with active choroidal neovascularization (HCC)  H35.3211 Intravitreal Injection, Pharmacologic Agent - OD - Right Eye    aflibercept (EYLEA) SOLN 2 mg  2. Central serous chorioretinopathy of right eye  H35.711   3. Retinal edema  H35.81 OCT, Retina - OU - Both Eyes  4. Essential hypertension  I10   5. Hypertensive retinopathy of both eyes  H35.033   6. 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 Posterior vitreous detachment of right eye  H43.811   8. Combined forms of age-related cataract of both eyes  H25.813     1-3. Exudative age related macular  degeneration vs CSCR OD    - repeat FA (03.02.19) without leakage / pooling into area of SRF  - review of systems reveals significant stressors in life, but no exogenous steroid use  - discussed treatment with anti-VEGF vs 25mg  of Eplerenone  - pt unable to afford Eplerenone   - S/P IVA #1 OD (09.27.19),  #2 (10.25.19), #3 (11.22.19) -- minimal response  - pt approved for [employer] and IVE  - S/P IVE OD #1 (12.20.19), #2 (01.20.20), #3 (02.18.20)  - delayed f/u since Feb 2020 due to COVID-19 concerns  - today, VA stable at 20/40 OD but pt reports subjective decline in vision -- worse central blur  - OCT today with interval increase in SRF  - recommend IVE OD #4 today, 07.20.20  - pt wishes to proceed  - RBA of procedure discussed, questions answered  - informed consent obtained and signed  - see procedure note  - [email or web address] benefits investigation initiated -- approved for 2020  - f/u 4-6 weeks -- DFE/OCT/ possible injection  4,5. Hypertensive retinopathy OU - discussed importance of tight BP control - stable - monitor  6. Lattice degeneration OS- - patch of inferior lattice OS -- no RT, holes or SRF - discussed findings, prognosis, and treatment options including observation - monitor  7. PVD / vitreous syneresis OD-   Discussed findings and prognosis  No RT or RD on 360 peripheral exam  Reviewed s/s of RT/RD  Strict return precautions for any such RT/RD signs/symptoms   8. Combined form age-related cataract OU-  - The symptoms of cataract, surgical options, and treatments and risks were discussed with patient. - discussed diagnosis and progression - not yet visually significant - monitor for now   Ophthalmic Meds Ordered this visit:  Meds ordered this encounter  Medications  . aflibercept (EYLEA) SOLN 2 mg       Return in about 4 weeks (around 04/15/2019) for Dilated Exam, OCT, Possible Injxn.  There are no Patient Instructions on file for this visit.   Explained  the diagnoses, plan, and follow up with the patient and they expressed understanding.  Patient expressed understanding of the importance of proper follow up care.   This document serves as a record of services personally performed by Gardiner Sleeper, MD, PhD. It was created on their behalf by Ernest Mallick, OA, an ophthalmic assistant. The creation of this record is the provider's dictation and/or activities during the visit.    Electronically signed by: Ernest Mallick, OA  07.19.2020 3:45 PM     Gardiner Sleeper, M.D., Ph.D. Diseases & Surgery of the Retina and Vitreous [facility]  I have reviewed the above documentation for accuracy and completeness, and I agree with the above. Gardiner Sleeper, M.D., Ph.D. 03/18/19 3:45 PM    Abbreviations: M myopia (nearsighted); A astigmatism; H hyperopia (farsighted); P presbyopia; Mrx spectacle prescription;  CTL contact lenses; OD right eye; OS left eye; OU both eyes  XT exotropia; ET esotropia; PEK punctate epithelial keratitis; PEE punctate epithelial erosions; DES dry eye syndrome; MGD meibomian gland dysfunction; ATs artificial tears; PFAT's preservative free artificial tears; [place] nuclear sclerotic cataract; PSC posterior subcapsular cataract; ERM epi-retinal membrane; PVD posterior vitreous detachment; RD retinal detachment; DM  diabetes mellitus; DR diabetic retinopathy; NPDR non-proliferative diabetic retinopathy; PDR proliferative diabetic retinopathy; CSME clinically significant macular edema; DME diabetic macular edema; dbh dot blot hemorrhages; CWS cotton wool spot; POAG primary open angle glaucoma; C/D cup-to-disc ratio; HVF humphrey visual field; GVF goldmann visual field; OCT optical coherence tomography; IOP intraocular pressure; BRVO Branch retinal vein occlusion; CRVO central retinal vein occlusion; CRAO central retinal artery occlusion; BRAO branch retinal artery occlusion; RT retinal tear; SB scleral buckle; PPV pars  plana vitrectomy; VH Vitreous hemorrhage; PRP panretinal laser photocoagulation; IVK intravitreal kenalog; VMT vitreomacular traction; MH Macular hole;  NVD neovascularization of the disc; NVE neovascularization elsewhere; AREDS age related eye disease study; ARMD age related macular degeneration; POAG primary open angle glaucoma; EBMD epithelial/anterior basement membrane dystrophy; ACIOL anterior chamber intraocular lens; IOL intraocular lens; PCIOL posterior chamber intraocular lens; Phaco/IOL phacoemulsification with intraocular lens placement; [place] photorefractive keratectomy; LASIK laser assisted in situ keratomileusis; HTN hypertension; DM diabetes mellitus; COPD chronic obstructive pulmonary disease

## 2019-03-18 ENCOUNTER — Ambulatory Visit (INDEPENDENT_AMBULATORY_CARE_PROVIDER_SITE_OTHER): Payer: Medicare HMO | Admitting: Ophthalmology

## 2019-03-18 ENCOUNTER — Encounter (INDEPENDENT_AMBULATORY_CARE_PROVIDER_SITE_OTHER): Payer: Self-pay | Admitting: Ophthalmology

## 2019-03-18 ENCOUNTER — Other Ambulatory Visit: Payer: Self-pay

## 2019-03-18 DIAGNOSIS — H25813 Combined forms of age-related cataract, bilateral: Secondary | ICD-10-CM

## 2019-03-18 DIAGNOSIS — H35412 Lattice degeneration of retina, left eye: Secondary | ICD-10-CM | POA: Diagnosis not present

## 2019-03-18 DIAGNOSIS — I10 Essential (primary) hypertension: Secondary | ICD-10-CM | POA: Diagnosis not present

## 2019-03-18 DIAGNOSIS — H35033 Hypertensive retinopathy, bilateral: Secondary | ICD-10-CM | POA: Diagnosis not present

## 2019-03-18 DIAGNOSIS — H353211 Exudative age-related macular degeneration, right eye, with active choroidal neovascularization: Secondary | ICD-10-CM

## 2019-03-18 DIAGNOSIS — H43811 Vitreous degeneration, right eye: Secondary | ICD-10-CM | POA: Diagnosis not present

## 2019-03-18 DIAGNOSIS — H35711 Central serous chorioretinopathy, right eye: Secondary | ICD-10-CM | POA: Diagnosis not present

## 2019-03-18 DIAGNOSIS — H3581 Retinal edema: Secondary | ICD-10-CM

## 2019-03-18 MED ORDER — AFLIBERCEPT 2MG/0.05ML IZ SOLN FOR KALEIDOSCOPE
2.0000 mg | INTRAVITREAL | Status: AC | PRN
  Administered 2019-03-18: 2 mg via INTRAVITREAL

## 2019-04-19 ENCOUNTER — Encounter (INDEPENDENT_AMBULATORY_CARE_PROVIDER_SITE_OTHER): Payer: Medicare HMO | Admitting: Ophthalmology

## 2019-04-19 ENCOUNTER — Encounter (INDEPENDENT_AMBULATORY_CARE_PROVIDER_SITE_OTHER): Payer: Self-pay

## 2019-04-29 NOTE — Progress Notes (Signed)
[facility] Note  04/30/2019      CHIEF COMPLAINT Patient presents for Retina Follow Up   HISTORY OF PRESENT ILLNESS: Darlene Crawford is a 78 y.o. female who presents to the clinic today for:   HPI    Retina Follow Up    Patient presents with  Dry AMD.  In right eye.  This started weeks ago.  Severity is moderate.  Duration of weeks.  Since onset it is stable.  I, the attending physician,  performed the HPI with the patient and updated documentation appropriately.          Comments    Patient states her vision is about the same.  Denies eye pain or discomfort. Patient still has same floaters OD.       Last edited by Bernarda Caffey, MD on 04/30/2019  1:40 PM. (History)    pt states she has not noticed much change in her vision since last visit, but states the blurry spot may have shrunken a little  Referring physician: Monico Blitz, MD [place],  [place] [address]  HISTORICAL INFORMATION:   Selected notes from the MEDICAL RECORD NUMBER Referred by Dr. Leander Rams for concern of ARMD OD with progression to CNVM vs CSR;  LEE- 01.29.19 (R. Davis) [BCVA OD: 20/100 OS: 20/30] Ocular Hx- AMD OU, cataract OU, DES OU, HTN retinopathy OU, WWP OU, choroidal nevus OD;  PMH- elevated chol, HTN, hyperthyoridism    CURRENT MEDICATIONS: Current Outpatient Medications (Ophthalmic Drugs)  Medication Sig  . ARTIFICIAL TEAR SOLUTION OP Place 1 drop into both eyes daily as needed (dry eyes).   Current Facility-Administered Medications (Ophthalmic Drugs)  Medication Route  . aflibercept (EYLEA) SOLN 2 mg Intravitreal  . aflibercept (EYLEA) SOLN 2 mg Intravitreal  . aflibercept (EYLEA) SOLN 2 mg Intravitreal   Current Outpatient Medications (Other)  Medication Sig  . acidophilus (RISAQUAD) CAPS capsule Take 1 capsule by mouth daily.  Marland Kitchen atorvastatin (LIPITOR) 10 MG tablet Take 10 mg by mouth at bedtime.   . Cholecalciferol (VITAMIN D) 50 MCG (2000 UT) tablet  Take 4,000 Units by mouth at bedtime.   . Cyanocobalamin (B-12 PO) Take 1 Dose by mouth 2 (two) times a week.  . fluticasone (FLONASE) 50 MCG/ACT nasal spray Place 1 spray into both nostrils daily as needed for allergies or rhinitis.  . hydrochlorothiazide (HYDRODIURIL) 25 MG tablet Take 25 mg by mouth daily. 1/2 tablet am, 1/2 tablet pm  . levothyroxine (SYNTHROID, LEVOTHROID) 100 MCG tablet Take 100 mcg by mouth daily before breakfast.  . loratadine (CLARITIN) 10 MG tablet Take 10 mg by mouth daily as needed for allergies.   Marland Kitchen losartan (COZAAR) 100 MG tablet Take 100 mg by mouth daily.   . Melatonin 10 MG CAPS Take 10 mg by mouth at bedtime.  . Multiple Vitamins-Minerals (PRESERVISION AREDS 2 PO) Take 1 tablet by mouth at bedtime.   . sertraline (ZOLOFT) 50 MG tablet Take 50 mg by mouth daily.  . sodium chloride (OCEAN) 0.65 % SOLN nasal spray Place 1 spray into both nostrils as needed for congestion.   Current Facility-Administered Medications (Other)  Medication Route  . Bevacizumab (AVASTIN) SOLN 1.25 mg Intravitreal  . Bevacizumab (AVASTIN) SOLN 1.25 mg Intravitreal  . Bevacizumab (AVASTIN) SOLN 1.25 mg Intravitreal      REVIEW OF SYSTEMS: ROS    Positive for: Musculoskeletal, Cardiovascular, Eyes   Negative for: Constitutional, Gastrointestinal, Neurological, Skin, Genitourinary, HENT, Endocrine, Respiratory, Psychiatric,  Allergic/Imm, Heme/Lymph   Last edited by Doneen Poisson on 04/30/2019  1:07 PM. (History)       ALLERGIES Allergies  Allergen Reactions  . Trilyte [Peg 3350-Kcl-Na Bicarb-Nacl]     HEMATEMESIS  . Vicodin [Hydrocodone-Acetaminophen] Nausea And Vomiting    PAST MEDICAL HISTORY Past Medical History:  Diagnosis Date  . Anxiety and depression   . Hypercholesterolemia   . Hypertension   . Hypothyroidism   . Macular degeneration    OD  . Osteoarthritis   . Seasonal allergies    Past Surgical History:  Procedure Laterality Date  . BIOPSY   10/01/2018   Procedure: BIOPSY;  Surgeon: Danie Binder, MD;  [facility]: [facility];  Service: Endoscopy;;  gastric bx & gastric polyp  . COLONOSCOPY N/A 10/01/2018   Procedure: COLONOSCOPY;  Surgeon: Danie Binder, MD;  Location: [facility];  Service: Endoscopy;  Laterality: N/A;  10:30am  . ESOPHAGOGASTRODUODENOSCOPY N/A 10/01/2018   Procedure: ESOPHAGOGASTRODUODENOSCOPY (EGD);  Surgeon: Danie Binder, MD;  Location: [facility];  Service: Endoscopy;  Laterality: N/A;  . POLYPECTOMY  10/01/2018   Procedure: POLYPECTOMY;  Surgeon: Danie Binder, MD;  Location: [facility];  Service: Endoscopy;;  colon   . TUBAL LIGATION    . UTERINE FIBROID SURGERY      FAMILY HISTORY Family History  Problem Relation Age of Onset  . Cancer Mother        lung  . Stroke Father   . Stroke Brother   . Colon cancer Neg Hx     SOCIAL HISTORY Social History   Tobacco Use  . Smoking status: Former Smoker    Types: Cigarettes  . Smokeless tobacco: Never Used  Substance Use Topics  . Alcohol use: No    Frequency: Never  . Drug use: No         OPHTHALMIC EXAM:  Base Eye Exam    Visual Acuity (Snellen - Linear)      Right Left   Dist cc 20/40 +2 20/20 -2   Dist ph cc NI        Tonometry (Tonopen, 1:09 PM)      Right Left   Pressure 11 13       Pupils      Dark Light Shape React APD   Right 3 2 Round Brisk 0   Left 3 2 Round Brisk 0       Visual Fields      Left Right    Full Full       Extraocular Movement      Right Left    Full Full       Neuro/Psych    Oriented x3: Yes   Mood/Affect: Normal       Dilation    Both eyes: 1.0% Mydriacyl, 2.5% Phenylephrine @ 1:09 PM        Slit Lamp and Fundus Exam    Slit Lamp Exam      Right Left   Lids/Lashes Dermatochalasis - upper lid Dermatochalasis - upper lid   Conjunctiva/Sclera White and quiet White and quiet   Cornea Arcus, early nasal band K, 1+PEE Arcus, early nasal band K, 1+PEE   Anterior Chamber  moderate depth, with narrow angle  moderate depth, with narrow angle temporally   Iris Round and well dilated Round and well dilated   Lens 2-3+ Nuclear sclerosis, 2-3+ Cortical cataract 2+ Nuclear sclerosis, 2+ Cortical cataract   Vitreous Vitreous syneresis, Posterior vitreous detachment,  Weiss ring Vitreous syneresis       Fundus Exam      Right Left   Disc Pink and Sharp Pink and Sharp, Tilted disc   C/D Ratio 0.4 0.4   Macula Blunted foveal reflex; Interval improvement in SRF--residual SRF mostly inferior; drusen; +RPE mottling and clumping Flat, good foveal reflex, Retinal pigment epithelial mottling, superior para-foveal focal area of RPE atrophy -- stable, rare drusen, No heme or edema   Vessels Mild AV crossing changes, Tortuous, Vascular attenuation Copper wiring, AV crossing changes, mildly Tortuous   Periphery Attached, peripheral drusen / RPE changes nasally, peripheral cystoid degeneration Patch of Lattice degeneration at 0600, scattered peripheral drusen, peripheral cystoid degeneration        Refraction    Wearing Rx      Sphere Cylinder Axis Add   Right +2.00 +1.25 158 +2.50   Left +2.00 +0.75 002 +2.50   Type: PAL          IMAGING AND PROCEDURES  Imaging and Procedures for 12/11/17  OCT, Retina - OU - Both Eyes       Right Eye Quality was good. Central Foveal Thickness: 308. Progression has improved. Findings include subretinal fluid, no IRF, retinal drusen , pigment epithelial detachment, subretinal hyper-reflective material, normal foveal contour, outer retinal atrophy (Mild Interval improvement in SRF).   Left Eye Quality was good. Central Foveal Thickness: 275. Progression has been stable. Findings include normal foveal contour, no IRF, no SRF, retinal drusen .   Notes *Images captured and stored on drive  Diagnosis / Impression:  OD: Mild Interval improvement in SRF OS: NFP, No IRF/SRF -- stable from prior  Clinical management:  See  below  Abbreviations: NFP - Normal foveal profile. CME - cystoid macular edema. PED - pigment epithelial detachment. IRF - intraretinal fluid. SRF - subretinal fluid. EZ - ellipsoid zone. ERM - epiretinal membrane. ORA - outer retinal atrophy. ORT - outer retinal tubulation. SRHM - subretinal hyper-reflective material         Intravitreal Injection, Pharmacologic Agent - OD - Right Eye       Time Out 04/30/2019. 1:30 AM. Confirmed correct patient, procedure, site, and patient consented.   Anesthesia Topical anesthesia was used. Anesthetic medications included Lidocaine 2%, Proparacaine 0.5%.   Procedure Preparation included 5% betadine to ocular surface, eyelid speculum. A 30 gauge needle was used.   Injection:  2 mg aflibercept Alfonse Flavors) SOLN   NDC: [id number], Lot: [id number], Expiration date: 11/26/2019   Route: Intravitreal, Site: Right Eye, Waste: 0.05 mL  Post-op Post injection exam found visual acuity of at least counting fingers. The patient tolerated the procedure well. There were no complications. The patient received written and verbal post procedure care education.                 ASSESSMENT/PLAN:    ICD-10-CM   1. Exudative age-related macular degeneration of right eye with active choroidal neovascularization (HCC)  H35.3211 Intravitreal Injection, Pharmacologic Agent - OD - Right Eye    aflibercept (EYLEA) SOLN 2 mg    CANCELED: Intravitreal Injection, Pharmacologic Agent - OS - Left Eye  2. Central serous chorioretinopathy of right eye  H35.711   3. Retinal edema  H35.81 OCT, Retina - OU - Both Eyes  4. Essential hypertension  I10   5. Hypertensive retinopathy of both eyes  H35.033   6. Lattice degeneration of left retina  H35.412   7. Posterior vitreous detachment of right eye  H43.811   8.  Combined forms of age-related cataract of both eyes  H25.813     1-3. Exudative age related macular degeneration vs CSCR OD    - repeat FA (03.02.19) without  leakage / pooling into area of SRF  - review of systems reveals significant stressors in life, but no exogenous steroid use  - discussed treatment with anti-VEGF vs 25mg  of Eplerenone  - pt unable to afford Eplerenone   - S/P IVA #1 OD (09.27.19),  #2 (10.25.19), #3 (11.22.19) -- minimal response  - pt approved for [employer] and IVE  - S/P IVE OD #1 (12.20.19), #2 (01.20.20), #3 (02.18.20), #4 (07.20.20)  - lost to f/u from Feb 2020 to July 2020 due to COVID-19 concerns  - today, VA stable at 20/40 OD but pt reports subjective improvement in size of central blur spot  - OCT today with mild interval improvement in SRF  - recommend IVE OD #5 today, 09.01.20  - pt wishes to proceed  - RBA of procedure discussed, questions answered  - informed consent obtained and signed  - see procedure note  - [email or web address] benefits investigation initiated -- approved for 2020  - f/u 4-6 weeks -- DFE/OCT/ possible injection  4,5. Hypertensive retinopathy OU - discussed importance of tight BP control - stable - monitor  6. Lattice degeneration OS-  - patch of inferior lattice OS -- no RT, holes or SRF  - discussed findings, prognosis, and treatment options including observation  - monitor  7. PVD / vitreous syneresis OD-   Discussed findings and prognosis  No RT or RD on 360 peripheral exam  Reviewed s/s of RT/RD  Strict return precautions for any such RT/RD signs/symptoms   8. Combined form age-related cataract OU-   - The symptoms of cataract, surgical options, and treatments and risks were discussed with patient.  - discussed diagnosis and progression  - not yet visually significant  - monitor for now   Ophthalmic Meds Ordered this visit:  Meds ordered this encounter  Medications  . aflibercept (EYLEA) SOLN 2 mg       Return for f/u 4-6 weeks f/u ARMD OD, DFE, OCT.  There are no Patient Instructions on file for this visit.   Explained the diagnoses, plan, and follow up with the patient  and they expressed understanding.  Patient expressed understanding of the importance of proper follow up care.   This document serves as a record of services personally performed by Gardiner Sleeper, MD, PhD. It was created on their behalf by Ernest Mallick, OA, an ophthalmic assistant. The creation of this record is the provider's dictation and/or activities during the visit.    Electronically signed by: Ernest Mallick, OA  08.31.2020 9:25 PM     Gardiner Sleeper, M.D., Ph.D. Diseases & Surgery of the Retina and Vitreous [facility]  I have reviewed the above documentation for accuracy and completeness, and I agree with the above. Gardiner Sleeper, M.D., Ph.D. 04/30/19 9:27 PM   Abbreviations: M myopia (nearsighted); A astigmatism; H hyperopia (farsighted); P presbyopia; Mrx spectacle prescription;  CTL contact lenses; OD right eye; OS left eye; OU both eyes  XT exotropia; ET esotropia; PEK punctate epithelial keratitis; PEE punctate epithelial erosions; DES dry eye syndrome; MGD meibomian gland dysfunction; ATs artificial tears; PFAT's preservative free artificial tears; [place] nuclear sclerotic cataract; PSC posterior subcapsular cataract; ERM epi-retinal membrane; PVD posterior vitreous detachment; RD retinal detachment; DM diabetes mellitus; DR diabetic retinopathy; NPDR non-proliferative diabetic retinopathy; PDR  proliferative diabetic retinopathy; CSME clinically significant macular edema; DME diabetic macular edema; dbh dot blot hemorrhages; CWS cotton wool spot; POAG primary open angle glaucoma; C/D cup-to-disc ratio; HVF humphrey visual field; GVF goldmann visual field; OCT optical coherence tomography; IOP intraocular pressure; BRVO Branch retinal vein occlusion; CRVO central retinal vein occlusion; CRAO central retinal artery occlusion; BRAO branch retinal artery occlusion; RT retinal tear; SB scleral buckle; PPV pars plana vitrectomy; VH Vitreous hemorrhage; PRP panretinal  laser photocoagulation; IVK intravitreal kenalog; VMT vitreomacular traction; MH Macular hole;  NVD neovascularization of the disc; NVE neovascularization elsewhere; AREDS age related eye disease study; ARMD age related macular degeneration; POAG primary open angle glaucoma; EBMD epithelial/anterior basement membrane dystrophy; ACIOL anterior chamber intraocular lens; IOL intraocular lens; PCIOL posterior chamber intraocular lens; Phaco/IOL phacoemulsification with intraocular lens placement; [place] photorefractive keratectomy; LASIK laser assisted in situ keratomileusis; HTN hypertension; DM diabetes mellitus; COPD chronic obstructive pulmonary disease

## 2019-04-30 ENCOUNTER — Other Ambulatory Visit: Payer: Self-pay

## 2019-04-30 ENCOUNTER — Ambulatory Visit (INDEPENDENT_AMBULATORY_CARE_PROVIDER_SITE_OTHER): Payer: Medicare HMO | Admitting: Ophthalmology

## 2019-04-30 ENCOUNTER — Encounter (INDEPENDENT_AMBULATORY_CARE_PROVIDER_SITE_OTHER): Payer: Self-pay | Admitting: Ophthalmology

## 2019-04-30 DIAGNOSIS — I10 Essential (primary) hypertension: Secondary | ICD-10-CM | POA: Diagnosis not present

## 2019-04-30 DIAGNOSIS — H35711 Central serous chorioretinopathy, right eye: Secondary | ICD-10-CM

## 2019-04-30 DIAGNOSIS — H35412 Lattice degeneration of retina, left eye: Secondary | ICD-10-CM | POA: Diagnosis not present

## 2019-04-30 DIAGNOSIS — H3581 Retinal edema: Secondary | ICD-10-CM

## 2019-04-30 DIAGNOSIS — H43811 Vitreous degeneration, right eye: Secondary | ICD-10-CM

## 2019-04-30 DIAGNOSIS — H25813 Combined forms of age-related cataract, bilateral: Secondary | ICD-10-CM

## 2019-04-30 DIAGNOSIS — H35033 Hypertensive retinopathy, bilateral: Secondary | ICD-10-CM

## 2019-04-30 DIAGNOSIS — H353211 Exudative age-related macular degeneration, right eye, with active choroidal neovascularization: Secondary | ICD-10-CM | POA: Diagnosis not present

## 2019-04-30 MED ORDER — AFLIBERCEPT 2MG/0.05ML IZ SOLN FOR KALEIDOSCOPE
2.0000 mg | INTRAVITREAL | Status: AC | PRN
  Administered 2019-04-30: 2 mg via INTRAVITREAL

## 2019-05-09 DIAGNOSIS — R69 Illness, unspecified: Secondary | ICD-10-CM | POA: Diagnosis not present

## 2019-06-03 NOTE — Progress Notes (Signed)
[facility] Note  06/11/2019      CHIEF COMPLAINT Patient presents for Retina Follow Up   HISTORY OF PRESENT ILLNESS: Darlene Crawford is a 78 y.o. female who presents to the clinic today for:   HPI    Retina Follow Up    Patient presents with  Wet AMD.  In right eye.  This started weeks ago.  Severity is moderate.  Duration of weeks.  Since onset it is stable.  I, the attending physician,  performed the HPI with the patient and updated documentation appropriately.          Comments    Patient states her vision is about the same.  She denies eye pain or discomfort and denies any new or worsening floaters or fol OU.       Last edited by Bernarda Caffey, MD on 06/11/2019  1:30 PM. (History)    pt states she feels like her vision is worse than before, she states her glasses are 78 years old and she feels like she needs new ones  Referring physician: Leticia Clas, [address] [place],  [place] [address]  HISTORICAL INFORMATION:   Selected notes from the MEDICAL RECORD NUMBER Referred by Dr. Leander Rams for concern of ARMD OD with progression to CNVM vs CSR;  LEE- 01.29.19 (R. Davis) [BCVA OD: 20/100 OS: 20/30] Ocular Hx- AMD OU, cataract OU, DES OU, HTN retinopathy OU, WWP OU, choroidal nevus OD;  PMH- elevated chol, HTN, hyperthyoridism    CURRENT MEDICATIONS: Current Outpatient Medications (Ophthalmic Drugs)  Medication Sig  . ARTIFICIAL TEAR SOLUTION OP Place 1 drop into both eyes daily as needed (dry eyes).   Current Facility-Administered Medications (Ophthalmic Drugs)  Medication Route  . aflibercept (EYLEA) SOLN 2 mg Intravitreal  . aflibercept (EYLEA) SOLN 2 mg Intravitreal  . aflibercept (EYLEA) SOLN 2 mg Intravitreal   Current Outpatient Medications (Other)  Medication Sig  . acidophilus (RISAQUAD) CAPS capsule Take 1 capsule by mouth daily.  Marland Kitchen atorvastatin (LIPITOR) 10 MG tablet Take 10 mg by mouth at bedtime.   .  Cholecalciferol (VITAMIN D) 50 MCG (2000 UT) tablet Take 4,000 Units by mouth at bedtime.   . Cyanocobalamin (B-12 PO) Take 1 Dose by mouth 2 (two) times a week.  . fluticasone (FLONASE) 50 MCG/ACT nasal spray Place 1 spray into both nostrils daily as needed for allergies or rhinitis.  . hydrochlorothiazide (HYDRODIURIL) 25 MG tablet Take 25 mg by mouth daily. 1/2 tablet am, 1/2 tablet pm  . levothyroxine (SYNTHROID, LEVOTHROID) 100 MCG tablet Take 100 mcg by mouth daily before breakfast.  . loratadine (CLARITIN) 10 MG tablet Take 10 mg by mouth daily as needed for allergies.   Marland Kitchen losartan (COZAAR) 100 MG tablet Take 100 mg by mouth daily.   . Melatonin 10 MG CAPS Take 10 mg by mouth at bedtime.  . Multiple Vitamins-Minerals (PRESERVISION AREDS 2 PO) Take 1 tablet by mouth at bedtime.   . sertraline (ZOLOFT) 50 MG tablet Take 50 mg by mouth daily.  . sodium chloride (OCEAN) 0.65 % SOLN nasal spray Place 1 spray into both nostrils as needed for congestion.   Current Facility-Administered Medications (Other)  Medication Route  . Bevacizumab (AVASTIN) SOLN 1.25 mg Intravitreal  . Bevacizumab (AVASTIN) SOLN 1.25 mg Intravitreal  . Bevacizumab (AVASTIN) SOLN 1.25 mg Intravitreal      REVIEW OF SYSTEMS: ROS    Positive for: Musculoskeletal, Cardiovascular, Eyes  Negative for: Constitutional, Gastrointestinal, Neurological, Skin, Genitourinary, HENT, Endocrine, Respiratory, Psychiatric, Allergic/Imm, Heme/Lymph   Last edited by Doneen Poisson on 06/11/2019  1:07 PM. (History)       ALLERGIES Allergies  Allergen Reactions  . Trilyte [Peg 3350-Kcl-Na Bicarb-Nacl]     HEMATEMESIS  . Vicodin [Hydrocodone-Acetaminophen] Nausea And Vomiting    PAST MEDICAL HISTORY Past Medical History:  Diagnosis Date  . Anxiety and depression   . Hypercholesterolemia   . Hypertension   . Hypothyroidism   . Macular degeneration    OD  . Osteoarthritis   . Seasonal allergies    Past Surgical  History:  Procedure Laterality Date  . BIOPSY  10/01/2018   Procedure: BIOPSY;  Surgeon: Danie Binder, MD;  [facility];  Service: Endoscopy;;  gastric bx & gastric polyp  . COLONOSCOPY N/A 10/01/2018   Procedure: COLONOSCOPY;  Surgeon: Danie Binder, MD;  [facility]: [facility];  Service: Endoscopy;  Laterality: N/A;  10:30am  . ESOPHAGOGASTRODUODENOSCOPY N/A 10/01/2018   Procedure: ESOPHAGOGASTRODUODENOSCOPY (EGD);  Surgeon: Danie Binder, MD;  [facility]: [facility];  Service: Endoscopy;  Laterality: N/A;  . POLYPECTOMY  10/01/2018   Procedure: POLYPECTOMY;  Surgeon: Danie Binder, MD;  Location: [facility];  Service: Endoscopy;;  colon   . TUBAL LIGATION    . UTERINE FIBROID SURGERY      FAMILY HISTORY Family History  Problem Relation Age of Onset  . Cancer Mother        lung  . Stroke Father   . Stroke Brother   . Colon cancer Neg Hx     SOCIAL HISTORY Social History   Tobacco Use  . Smoking status: Former Smoker    Types: Cigarettes  . Smokeless tobacco: Never Used  Substance Use Topics  . Alcohol use: No    Frequency: Never  . Drug use: No         OPHTHALMIC EXAM:  Base Eye Exam    Visual Acuity (Snellen - Linear)      Right Left   Dist cc 20/40 +1 20/20 -2   Dist ph cc NI    Correction: Glasses       Tonometry (Tonopen, 1:10 PM)      Right Left   Pressure 12 13       Pupils      Dark Light Shape React APD   Right 3 2 Round Brisk 0   Left 3 2 Round Brisk 0       Visual Fields      Left Right    Full Full       Extraocular Movement      Right Left    Full Full       Neuro/Psych    Oriented x3: Yes   Mood/Affect: Normal       Dilation    Right eye: 1.0% Mydriacyl, 2.5% Phenylephrine @ 1:10 PM  Patient deferred dilation OS--did not bring driver today        Slit Lamp and Fundus Exam    Slit Lamp Exam      Right Left   Lids/Lashes Dermatochalasis - upper lid Dermatochalasis - upper lid   Conjunctiva/Sclera  White and quiet White and quiet   Cornea Arcus, early nasal band K, 1+PEE Arcus, early nasal band K, 1+PEE   Anterior Chamber moderate depth, with narrow angle  moderate depth, with narrow angle temporally   Iris Round and well dilated Round and well  dilated   Lens 2-3+ Nuclear sclerosis, 2-3+ Cortical cataract 2+ Nuclear sclerosis, 2+ Cortical cataract   Vitreous Vitreous syneresis, Posterior vitreous detachment, Weiss ring Vitreous syneresis       Fundus Exam      Right Left   Disc Pink and Sharp    C/D Ratio 0.3 0.4   Macula Blunted foveal reflex; persistent SRF -- slight increase centrally; drusen; +RPE mottling and clumping    Vessels Mild AV crossing changes, Tortuous, Vascular attenuation    Periphery Attached, peripheral drusen / RPE changes nasally, peripheral cystoid degeneration         Refraction    Wearing Rx      Sphere Cylinder Axis Add   Right +2.00 +1.25 158 +2.50   Left +2.00 +0.75 002 +2.50   Type: PAL          IMAGING AND PROCEDURES  Imaging and Procedures for 12/11/17  OCT, Retina - OU - Both Eyes       Right Eye Quality was good. Central Foveal Thickness: 329. Progression has worsened. Findings include subretinal fluid, no IRF, retinal drusen , pigment epithelial detachment, subretinal hyper-reflective material, normal foveal contour, outer retinal atrophy (Persistent SRF -- slight worsening centrally).   Left Eye Quality was good. Central Foveal Thickness: 274. Progression has been stable. Findings include normal foveal contour, no IRF, no SRF, retinal drusen .   Notes *Images captured and stored on drive  Diagnosis / Impression:  OD: Persistent SRF -- slight increase centrally OS: NFP, No IRF/SRF  Clinical management:  See below  Abbreviations: NFP - Normal foveal profile. CME - cystoid macular edema. PED - pigment epithelial detachment. IRF - intraretinal fluid. SRF - subretinal fluid. EZ - ellipsoid zone. ERM - epiretinal membrane. ORA -  outer retinal atrophy. ORT - outer retinal tubulation. SRHM - subretinal hyper-reflective material         Intravitreal Injection, Pharmacologic Agent - OD - Right Eye       Time Out 06/11/2019. 1:03 PM. Confirmed correct patient, procedure, site, and patient consented.   Anesthesia Topical anesthesia was used. Anesthetic medications included Lidocaine 2%, Proparacaine 0.5%.   Procedure Preparation included 5% betadine to ocular surface, eyelid speculum. A 30 gauge needle was used.   Injection:  2 mg aflibercept Alfonse Flavors) SOLN   NDC: [id number], Lot: [id number], Expiration date: 12/17/2019   Route: Intravitreal, Site: Right Eye, Waste: 0 mL  Post-op Post injection exam found visual acuity of at least counting fingers. The patient tolerated the procedure well. There were no complications. The patient received written and verbal post procedure care education.                 ASSESSMENT/PLAN:    ICD-10-CM   1. Exudative age-related macular degeneration of right eye with active choroidal neovascularization (HCC)  H35.3211 Intravitreal Injection, Pharmacologic Agent - OD - Right Eye    aflibercept (EYLEA) SOLN 2 mg  2. Central serous chorioretinopathy of right eye  H35.711   3. Retinal edema  H35.81 OCT, Retina - OU - Both Eyes  4. Essential hypertension  I10   5. Hypertensive retinopathy of both eyes  H35.033   6. Lattice degeneration of left retina  H35.412   7. Posterior vitreous detachment of right eye  H43.811   8. Combined forms of age-related cataract of both eyes  H25.813     1-3. Exudative age related macular degeneration vs CSCR OD    - repeat FA (03.02.19) without leakage / pooling into  area of SRF  - review of systems reveals significant stressors in life, but no exogenous steroid use  - discussed treatment with anti-VEGF vs 25mg  of Eplerenone  - pt unable to afford Eplerenone   - S/P IVA #1 OD (09.27.19),  #2 (10.25.19), #3 (11.22.19) -- minimal  response  - pt approved for [employer] and IVE  - S/P IVE OD #1 (12.20.19), #2 (01.20.20), #3 (02.18.20), #4 (07.20.20) #5 (09.01.20)  - lost to f/u from Feb 2020 to July 2020 due to COVID-19 concerns  - today, VA stable at 20/40 OD   - OCT today with persistent SRF - slight increase centrally  - recommend IVE OD #6 today, 10.13.20  - pt wishes to proceed  - RBA of procedure discussed, questions answered  - informed consent obtained and signed  - see procedure note  - [email or web address] benefits investigation initiated -- approved for 2020  - f/u 4 weeks -- DFE/OCT/ possible injection  4,5. Hypertensive retinopathy OU  - discussed importance of tight BP control  - stable  - monitor   6. Lattice degeneration OS-  - patch of inferior lattice OS -- no RT, holes or SRF  - discussed findings, prognosis, and treatment options including observation  - monitor  7. PVD / vitreous syneresis OD-   Discussed findings and prognosis  No RT or RD on 360 peripheral exam  Reviewed s/s of RT/RD  Strict return precautions for any such RT/RD signs/symptoms  8. Combined form age-related cataract OU-   - The symptoms of cataract, surgical options, and treatments and risks were discussed with patient.  - discussed diagnosis and progression  - not yet visually significant  - monitor for now   Ophthalmic Meds Ordered this visit:  Meds ordered this encounter  Medications  . aflibercept (EYLEA) SOLN 2 mg       Return in about 4 weeks (around 07/09/2019) for f/u exu ARMD OD, DFE, OCT.  There are no Patient Instructions on file for this visit.   Explained the diagnoses, plan, and follow up with the patient and they expressed understanding.  Patient expressed understanding of the importance of proper follow up care.   Electronically signed by: Estill Bakes, COT 06/03/19 10:28 a.m.  This document serves as a record of services personally performed by Gardiner Sleeper, MD, PhD. It was created on their  behalf by Ernest Mallick, OA, an ophthalmic assistant. The creation of this record is the provider's dictation and/or activities during the visit.    Electronically signed by: Ernest Mallick, OA 10.13.2020 2:05 PM  Gardiner Sleeper, M.D., Ph.D. Diseases & Surgery of the Retina and Vitreous [facility] 06/11/19  I have reviewed the above documentation for accuracy and completeness, and I agree with the above. Gardiner Sleeper, M.D., Ph.D. 06/11/19 2:05 PM   Abbreviations: M myopia (nearsighted); A astigmatism; H hyperopia (farsighted); P presbyopia; Mrx spectacle prescription;  CTL contact lenses; OD right eye; OS left eye; OU both eyes  XT exotropia; ET esotropia; PEK punctate epithelial keratitis; PEE punctate epithelial erosions; DES dry eye syndrome; MGD meibomian gland dysfunction; ATs artificial tears; PFAT's preservative free artificial tears; [place] nuclear sclerotic cataract; PSC posterior subcapsular cataract; ERM epi-retinal membrane; PVD posterior vitreous detachment; RD retinal detachment; DM diabetes mellitus; DR diabetic retinopathy; NPDR non-proliferative diabetic retinopathy; PDR proliferative diabetic retinopathy; CSME clinically significant macular edema; DME diabetic macular edema; dbh dot blot hemorrhages; CWS cotton wool spot; POAG primary open angle glaucoma; C/D cup-to-disc ratio;  HVF humphrey visual field; GVF goldmann visual field; OCT optical coherence tomography; IOP intraocular pressure; BRVO Branch retinal vein occlusion; CRVO central retinal vein occlusion; CRAO central retinal artery occlusion; BRAO branch retinal artery occlusion; RT retinal tear; SB scleral buckle; PPV pars plana vitrectomy; VH Vitreous hemorrhage; PRP panretinal laser photocoagulation; IVK intravitreal kenalog; VMT vitreomacular traction; MH Macular hole;  NVD neovascularization of the disc; NVE neovascularization elsewhere; AREDS age related eye disease study; ARMD age related macular  degeneration; POAG primary open angle glaucoma; EBMD epithelial/anterior basement membrane dystrophy; ACIOL anterior chamber intraocular lens; IOL intraocular lens; PCIOL posterior chamber intraocular lens; Phaco/IOL phacoemulsification with intraocular lens placement; [place] photorefractive keratectomy; LASIK laser assisted in situ keratomileusis; HTN hypertension; DM diabetes mellitus; COPD chronic obstructive pulmonary disease

## 2019-06-05 DIAGNOSIS — R69 Illness, unspecified: Secondary | ICD-10-CM | POA: Diagnosis not present

## 2019-06-11 ENCOUNTER — Encounter (INDEPENDENT_AMBULATORY_CARE_PROVIDER_SITE_OTHER): Payer: Self-pay | Admitting: Ophthalmology

## 2019-06-11 ENCOUNTER — Ambulatory Visit (INDEPENDENT_AMBULATORY_CARE_PROVIDER_SITE_OTHER): Payer: Medicare HMO | Admitting: Ophthalmology

## 2019-06-11 DIAGNOSIS — H353211 Exudative age-related macular degeneration, right eye, with active choroidal neovascularization: Secondary | ICD-10-CM

## 2019-06-11 DIAGNOSIS — I10 Essential (primary) hypertension: Secondary | ICD-10-CM | POA: Diagnosis not present

## 2019-06-11 DIAGNOSIS — H35033 Hypertensive retinopathy, bilateral: Secondary | ICD-10-CM

## 2019-06-11 DIAGNOSIS — H3581 Retinal edema: Secondary | ICD-10-CM | POA: Diagnosis not present

## 2019-06-11 DIAGNOSIS — H35412 Lattice degeneration of retina, left eye: Secondary | ICD-10-CM | POA: Diagnosis not present

## 2019-06-11 DIAGNOSIS — H35711 Central serous chorioretinopathy, right eye: Secondary | ICD-10-CM | POA: Diagnosis not present

## 2019-06-11 DIAGNOSIS — H25813 Combined forms of age-related cataract, bilateral: Secondary | ICD-10-CM | POA: Diagnosis not present

## 2019-06-11 DIAGNOSIS — H43811 Vitreous degeneration, right eye: Secondary | ICD-10-CM

## 2019-06-11 MED ORDER — AFLIBERCEPT 2MG/0.05ML IZ SOLN FOR KALEIDOSCOPE
2.0000 mg | INTRAVITREAL | Status: AC | PRN
  Administered 2019-06-11: 2 mg via INTRAVITREAL

## 2019-06-19 DIAGNOSIS — I10 Essential (primary) hypertension: Secondary | ICD-10-CM | POA: Diagnosis not present

## 2019-06-19 DIAGNOSIS — Z299 Encounter for prophylactic measures, unspecified: Secondary | ICD-10-CM | POA: Diagnosis not present

## 2019-06-19 DIAGNOSIS — Z713 Dietary counseling and surveillance: Secondary | ICD-10-CM | POA: Diagnosis not present

## 2019-06-19 DIAGNOSIS — N183 Chronic kidney disease, stage 3 unspecified: Secondary | ICD-10-CM | POA: Diagnosis not present

## 2019-06-19 DIAGNOSIS — Z6832 Body mass index (BMI) 32.0-32.9, adult: Secondary | ICD-10-CM | POA: Diagnosis not present

## 2019-06-21 DIAGNOSIS — R69 Illness, unspecified: Secondary | ICD-10-CM | POA: Diagnosis not present

## 2019-07-16 DIAGNOSIS — R69 Illness, unspecified: Secondary | ICD-10-CM | POA: Diagnosis not present

## 2019-07-17 NOTE — Progress Notes (Signed)
[facility] Note  07/19/2019      CHIEF COMPLAINT Patient presents for Retina Follow Up   HISTORY OF PRESENT ILLNESS: Darlene Crawford is a 78 y.o. female who presents to the clinic today for:   HPI    Retina Follow Up    Patient presents with  Dry AMD.  In right eye.  This started 6 months ago.  Since onset it is stable.  I, the attending physician,  performed the HPI with the patient and updated documentation appropriately.          Comments    F/U EXU AMD OD. Patient states her vision is about the same, denies new onsets/issues. Pt using Refresh ou PRN       Last edited by Bernarda Caffey, MD on 07/19/2019  2:12 PM. (History)    pt states her vision has not changed since the last visit   Referring physician: Leticia Clas, [address] [place],  [place] [address]  HISTORICAL INFORMATION:   Selected notes from the MEDICAL RECORD NUMBER Referred by Dr. Leander Rams for concern of ARMD OD with progression to CNVM vs CSR;  LEE- 01.29.19 (R. Davis) [BCVA OD: 20/100 OS: 20/30] Ocular Hx- AMD OU, cataract OU, DES OU, HTN retinopathy OU, WWP OU, choroidal nevus OD;  PMH- elevated chol, HTN, hyperthyoridism    CURRENT MEDICATIONS: Current Outpatient Medications (Ophthalmic Drugs)  Medication Sig  . ARTIFICIAL TEAR SOLUTION OP Place 1 drop into both eyes daily as needed (dry eyes).   Current Facility-Administered Medications (Ophthalmic Drugs)  Medication Route  . aflibercept (EYLEA) SOLN 2 mg Intravitreal  . aflibercept (EYLEA) SOLN 2 mg Intravitreal  . aflibercept (EYLEA) SOLN 2 mg Intravitreal   Current Outpatient Medications (Other)  Medication Sig  . acidophilus (RISAQUAD) CAPS capsule Take 1 capsule by mouth daily.  Marland Kitchen atorvastatin (LIPITOR) 10 MG tablet Take 10 mg by mouth at bedtime.   . Cholecalciferol (VITAMIN D) 50 MCG (2000 UT) tablet Take 4,000 Units by mouth at bedtime.   . Cyanocobalamin (B-12 PO) Take 1 Dose by mouth 2  (two) times a week.  . fluticasone (FLONASE) 50 MCG/ACT nasal spray Place 1 spray into both nostrils daily as needed for allergies or rhinitis.  . hydrochlorothiazide (HYDRODIURIL) 25 MG tablet Take 25 mg by mouth daily. 1/2 tablet am, 1/2 tablet pm  . levothyroxine (SYNTHROID, LEVOTHROID) 100 MCG tablet Take 100 mcg by mouth daily before breakfast.  . loratadine (CLARITIN) 10 MG tablet Take 10 mg by mouth daily as needed for allergies.   Marland Kitchen losartan (COZAAR) 100 MG tablet Take 100 mg by mouth daily.   . Melatonin 10 MG CAPS Take 10 mg by mouth at bedtime.  . Multiple Vitamins-Minerals (PRESERVISION AREDS 2 PO) Take 1 tablet by mouth at bedtime.   . sertraline (ZOLOFT) 50 MG tablet Take 50 mg by mouth daily.  . sodium chloride (OCEAN) 0.65 % SOLN nasal spray Place 1 spray into both nostrils as needed for congestion.   Current Facility-Administered Medications (Other)  Medication Route  . Bevacizumab (AVASTIN) SOLN 1.25 mg Intravitreal  . Bevacizumab (AVASTIN) SOLN 1.25 mg Intravitreal  . Bevacizumab (AVASTIN) SOLN 1.25 mg Intravitreal      REVIEW OF SYSTEMS: ROS    Positive for: Eyes   Negative for: Constitutional, Gastrointestinal, Neurological, Skin, Genitourinary, Musculoskeletal, HENT, Endocrine, Cardiovascular, Respiratory, Psychiatric, Allergic/Imm, Heme/Lymph   Last edited by Zenovia Jordan, LPN on [id number]  [id number] PM. (  History)       ALLERGIES Allergies  Allergen Reactions  . Trilyte [Peg 3350-Kcl-Na Bicarb-Nacl]     HEMATEMESIS  . Vicodin [Hydrocodone-Acetaminophen] Nausea And Vomiting    PAST MEDICAL HISTORY Past Medical History:  Diagnosis Date  . Anxiety and depression   . Hypercholesterolemia   . Hypertension   . Hypothyroidism   . Macular degeneration    OD  . Osteoarthritis   . Seasonal allergies    Past Surgical History:  Procedure Laterality Date  . BIOPSY  10/01/2018   Procedure: BIOPSY;  Surgeon: Danie Binder, MD;  [facility]: [facility];   Service: Endoscopy;;  gastric bx & gastric polyp  . COLONOSCOPY N/A 10/01/2018   Procedure: COLONOSCOPY;  Surgeon: Danie Binder, MD;  Location: [facility];  Service: Endoscopy;  Laterality: N/A;  10:30am  . ESOPHAGOGASTRODUODENOSCOPY N/A 10/01/2018   Procedure: ESOPHAGOGASTRODUODENOSCOPY (EGD);  Surgeon: Danie Binder, MD;  Location: [facility];  Service: Endoscopy;  Laterality: N/A;  . POLYPECTOMY  10/01/2018   Procedure: POLYPECTOMY;  Surgeon: Danie Binder, MD;  Location: [facility];  Service: Endoscopy;;  colon   . TUBAL LIGATION    . UTERINE FIBROID SURGERY      FAMILY HISTORY Family History  Problem Relation Age of Onset  . Cancer Mother        lung  . Stroke Father   . Stroke Brother   . Colon cancer Neg Hx     SOCIAL HISTORY Social History   Tobacco Use  . Smoking status: Former Smoker    Types: Cigarettes  . Smokeless tobacco: Never Used  Substance Use Topics  . Alcohol use: No    Frequency: Never  . Drug use: No         OPHTHALMIC EXAM:  Base Eye Exam    Visual Acuity (Snellen - Linear)      Right Left   Dist cc 20/40 +2 20/20 -2   Dist ph cc NI NI   Correction: Glasses       Tonometry (Tonopen, 1:28 PM)      Right Left   Pressure 10 11       Pupils      Dark Light Shape React APD   Right 3 2 Round Brisk None   Left 3 2 Round Brisk None       Visual Fields (Counting fingers)      Left Right    Full Full       Extraocular Movement      Right Left    Full, Ortho Full, Ortho       Neuro/Psych    Oriented x3: Yes   Mood/Affect: Normal       Dilation    Both eyes: 1.0% Mydriacyl, 2.5% Phenylephrine @ 1:25 PM        Slit Lamp and Fundus Exam    Slit Lamp Exam      Right Left   Lids/Lashes Dermatochalasis - upper lid Dermatochalasis - upper lid   Conjunctiva/Sclera White and quiet White and quiet   Cornea Arcus, early nasal band K, 1+PEE Arcus, early nasal band K, 1+PEE   Anterior Chamber moderate depth, with narrow  angle  moderate depth, with narrow angle temporally   Iris Round and well dilated Round and well dilated   Lens 2-3+ Nuclear sclerosis, 2-3+ Cortical cataract 2+ Nuclear sclerosis, 2+ Cortical cataract   Vitreous Vitreous syneresis, Posterior vitreous detachment, Weiss ring Vitreous syneresis  Fundus Exam      Right Left   Disc Pink and Sharp Pink and Sharp, Tilted disc   C/D Ratio 0.3 0.4   Macula Blunted foveal reflex; persistent SRF; drusen; +RPE mottling and clumping Flat, good foveal reflex, Retinal pigment epithelial mottling, superior para-foveal focal area of RPE atrophy -- stable, rare drusen, No heme or edema   Vessels Mild AV crossing changes, Tortuous, Vascular attenuation Copper wiring, AV crossing changes, mildly Tortuous   Periphery Attached, peripheral drusen / RPE changes nasally, peripheral cystoid degeneration Patch of Lattice degeneration at 0600, scattered peripheral drusen, peripheral cystoid degeneration          IMAGING AND PROCEDURES  Imaging and Procedures for 12/11/17  Intravitreal Injection, Pharmacologic Agent - OD - Right Eye       Time Out 07/19/2019. 1:32 PM. Confirmed correct patient, procedure, site, and patient consented.   Anesthesia Topical anesthesia was used. Anesthetic medications included Lidocaine 2%, Proparacaine 0.5%.   Procedure Preparation included 5% betadine to ocular surface, eyelid speculum. A 30 gauge needle was used.   Injection:  2 mg aflibercept Alfonse Flavors) SOLN   NDC: A3590391, LotJL:[id number], Expiration date: 12/24/2019   Route: Intravitreal, Site: Right Eye, Waste: 0.05 mL  Post-op Post injection exam found visual acuity of at least counting fingers. The patient tolerated the procedure well. There were no complications. The patient received written and verbal post procedure care education.        OCT, Retina - OU - Both Eyes       Right Eye Quality was good. Central Foveal Thickness: 349. Progression has  been stable. Findings include subretinal fluid, no IRF, retinal drusen , pigment epithelial detachment, subretinal hyper-reflective material, normal foveal contour, outer retinal atrophy (Persistent SRF; ratty photo receptors).   Left Eye Quality was good. Central Foveal Thickness: 273. Progression has been stable. Findings include normal foveal contour, no IRF, no SRF, retinal drusen .   Notes *Images captured and stored on drive  Diagnosis / Impression:  OD: Persistent SRF; ratty photo receptors OS: NFP, No IRF/SRF  Clinical management:  See below  Abbreviations: NFP - Normal foveal profile. CME - cystoid macular edema. PED - pigment epithelial detachment. IRF - intraretinal fluid. SRF - subretinal fluid. EZ - ellipsoid zone. ERM - epiretinal membrane. ORA - outer retinal atrophy. ORT - outer retinal tubulation. SRHM - subretinal hyper-reflective material                  ASSESSMENT/PLAN:    ICD-10-CM   1. Exudative age-related macular degeneration of right eye with active choroidal neovascularization (HCC)  H35.3211 Intravitreal Injection, Pharmacologic Agent - OD - Right Eye    aflibercept (EYLEA) SOLN 2 mg  2. Central serous chorioretinopathy of right eye  H35.711   3. Retinal edema  H35.81 OCT, Retina - OU - Both Eyes  4. Essential hypertension  I10   5. Hypertensive retinopathy of both eyes  H35.033   6. Lattice degeneration of left retina  H35.412   7. Posterior vitreous detachment of right eye  H43.811   8. Combined forms of age-related cataract of both eyes  H25.813     1-3. Exudative age related macular degeneration vs CSCR OD    - repeat FA (03.02.19) without leakage / pooling into area of SRF  - review of systems reveals significant stressors in life, but no exogenous steroid use  - discussed treatment with anti-VEGF vs po Eplerenone  - pt unable to afford Eplerenone   -  S/P IVA #1 OD (09.27.19),  #2 (10.25.19), #3 (11.22.19) -- minimal response  - pt  approved for [employer] and IVE  - S/P IVE OD #1 (12.20.19), #2 (01.20.20), #3 (02.18.20), #4 (07.20.20) #5 (09.01.20), #6 (10.13.20)  - lost to f/u from Feb 2020 to July 2020 due to COVID-19 concerns  - today, VA stable at 20/40 OD   - OCT today with persistent SRF  - recommend IVE OD #7 today, 11.20.20, and re-discussed possible addition of po eplerenone (50 mg daily)  - pt wishes to proceed with IVE OD  - RBA of procedure discussed, questions answered  - informed consent obtained and signed  - see procedure note  - [employer] benefits investigation initiated -- approved for 2020  - f/u 4 weeks -- DFE/OCT/ possible injection  4,5. Hypertensive retinopathy OU  - discussed importance of tight BP control  - stable  - monitor   6. Lattice degeneration OS-  - patch of inferior lattice OS -- no RT, holes or SRF  - discussed findings, prognosis, and treatment options including observation  - monitor  7. PVD / vitreous syneresis OD-   Discussed findings and prognosis  No RT or RD on 360 peripheral exam  Reviewed s/s of RT/RD  Strict return precautions for any such RT/RD signs/symptoms  8. Combined form age-related cataract OU-   - The symptoms of cataract, surgical options, and treatments and risks were discussed with patient.  - discussed diagnosis and progression  - not yet visually significant  - monitor for now   Ophthalmic Meds Ordered this visit:  Meds ordered this encounter  Medications  . aflibercept (EYLEA) SOLN 2 mg       Return in about 4 weeks (around 08/16/2019) for f/u CSR, DFE, OCT.  There are no Patient Instructions on file for this visit.   Explained the diagnoses, plan, and follow up with the patient and they expressed understanding.  Patient expressed understanding of the importance of proper follow up care.   This document serves as a record of services personally performed by Gardiner Sleeper, MD, PhD. It was created on their behalf by Ernest Mallick, OA, an  ophthalmic assistant. The creation of this record is the provider's dictation and/or activities during the visit.    Electronically signed by: Ernest Mallick, OA 11.18.2020 7:27 PM  Gardiner Sleeper, M.D., Ph.D. Diseases & Surgery of the Retina and Vitreous [facility]  I have reviewed the above documentation for accuracy and completeness, and I agree with the above. Gardiner Sleeper, M.D., Ph.D. 07/19/19 7:27 PM    Abbreviations: M myopia (nearsighted); A astigmatism; H hyperopia (farsighted); P presbyopia; Mrx spectacle prescription;  CTL contact lenses; OD right eye; OS left eye; OU both eyes  XT exotropia; ET esotropia; PEK punctate epithelial keratitis; PEE punctate epithelial erosions; DES dry eye syndrome; MGD meibomian gland dysfunction; ATs artificial tears; PFAT's preservative free artificial tears; [place] nuclear sclerotic cataract; PSC posterior subcapsular cataract; ERM epi-retinal membrane; PVD posterior vitreous detachment; RD retinal detachment; DM diabetes mellitus; DR diabetic retinopathy; NPDR non-proliferative diabetic retinopathy; PDR proliferative diabetic retinopathy; CSME clinically significant macular edema; DME diabetic macular edema; dbh dot blot hemorrhages; CWS cotton wool spot; POAG primary open angle glaucoma; C/D cup-to-disc ratio; HVF humphrey visual field; GVF goldmann visual field; OCT optical coherence tomography; IOP intraocular pressure; BRVO Branch retinal vein occlusion; CRVO central retinal vein occlusion; CRAO central retinal artery occlusion; BRAO branch retinal artery occlusion; RT retinal tear; SB  scleral buckle; PPV pars plana vitrectomy; VH Vitreous hemorrhage; PRP panretinal laser photocoagulation; IVK intravitreal kenalog; VMT vitreomacular traction; MH Macular hole;  NVD neovascularization of the disc; NVE neovascularization elsewhere; AREDS age related eye disease study; ARMD age related macular degeneration; POAG primary open angle  glaucoma; EBMD epithelial/anterior basement membrane dystrophy; ACIOL anterior chamber intraocular lens; IOL intraocular lens; PCIOL posterior chamber intraocular lens; Phaco/IOL phacoemulsification with intraocular lens placement; [place] photorefractive keratectomy; LASIK laser assisted in situ keratomileusis; HTN hypertension; DM diabetes mellitus; COPD chronic obstructive pulmonary disease

## 2019-07-19 ENCOUNTER — Ambulatory Visit (INDEPENDENT_AMBULATORY_CARE_PROVIDER_SITE_OTHER): Payer: Medicare HMO | Admitting: Ophthalmology

## 2019-07-19 ENCOUNTER — Encounter (INDEPENDENT_AMBULATORY_CARE_PROVIDER_SITE_OTHER): Payer: Self-pay | Admitting: Ophthalmology

## 2019-07-19 DIAGNOSIS — H353211 Exudative age-related macular degeneration, right eye, with active choroidal neovascularization: Secondary | ICD-10-CM | POA: Diagnosis not present

## 2019-07-19 DIAGNOSIS — H3581 Retinal edema: Secondary | ICD-10-CM

## 2019-07-19 DIAGNOSIS — H35412 Lattice degeneration of retina, left eye: Secondary | ICD-10-CM | POA: Diagnosis not present

## 2019-07-19 DIAGNOSIS — I10 Essential (primary) hypertension: Secondary | ICD-10-CM | POA: Diagnosis not present

## 2019-07-19 DIAGNOSIS — H35033 Hypertensive retinopathy, bilateral: Secondary | ICD-10-CM

## 2019-07-19 DIAGNOSIS — H25813 Combined forms of age-related cataract, bilateral: Secondary | ICD-10-CM | POA: Diagnosis not present

## 2019-07-19 DIAGNOSIS — H35711 Central serous chorioretinopathy, right eye: Secondary | ICD-10-CM | POA: Diagnosis not present

## 2019-07-19 DIAGNOSIS — H43811 Vitreous degeneration, right eye: Secondary | ICD-10-CM

## 2019-07-19 MED ORDER — AFLIBERCEPT 2MG/0.05ML IZ SOLN FOR KALEIDOSCOPE
2.0000 mg | INTRAVITREAL | Status: AC | PRN
  Administered 2019-07-19: 2 mg via INTRAVITREAL

## 2019-08-19 NOTE — Progress Notes (Addendum)
[facility] Note  08/20/2019      CHIEF COMPLAINT Patient presents for Retina Follow Up   HISTORY OF PRESENT ILLNESS: Darlene Crawford is a 78 y.o. female who presents to the clinic today for:   HPI    Retina Follow Up    Patient presents with  Wet AMD.  In right eye.  This started months ago.  Severity is moderate.  Duration of 5 weeks.  Since onset it is stable.  I, the attending physician,  performed the HPI with the patient and updated documentation appropriately.          Comments    78 y/o female pt here for 5 wk f/u for exu ARMD of the right eye.  No change in [place] in either eye.  Denies pain, flashes, floaters.  Dark spot in right eye vision is less dim.  Discovered she cannot take oral eplerenone due to effects on kidneys and blood pressure.  No gtts.       Last edited by Bernarda Caffey, MD on 08/21/2019  8:59 AM. (History)    pt states she doesn't feel like her vision has gotten any better   Referring physician: Monico Blitz, MD [place],  [place] [address]  HISTORICAL INFORMATION:   Selected notes from the MEDICAL RECORD NUMBER Referred by Dr. Leander Rams for concern of ARMD OD with progression to CNVM vs CSR    CURRENT MEDICATIONS: Current Outpatient Medications (Ophthalmic Drugs)  Medication Sig  . ARTIFICIAL TEAR SOLUTION OP Place 1 drop into both eyes daily as needed (dry eyes).   Current Facility-Administered Medications (Ophthalmic Drugs)  Medication Route  . aflibercept (EYLEA) SOLN 2 mg Intravitreal  . aflibercept (EYLEA) SOLN 2 mg Intravitreal  . aflibercept (EYLEA) SOLN 2 mg Intravitreal   Current Outpatient Medications (Other)  Medication Sig  . acidophilus (RISAQUAD) CAPS capsule Take 1 capsule by mouth daily.  Marland Kitchen atorvastatin (LIPITOR) 10 MG tablet Take 10 mg by mouth at bedtime.   . Cholecalciferol (VITAMIN D) 50 MCG (2000 UT) tablet Take 4,000 Units by mouth at bedtime.   . Cyanocobalamin (B-12 PO) Take 1 Dose by  mouth 2 (two) times a week.  . fluticasone (FLONASE) 50 MCG/ACT nasal spray Place 1 spray into both nostrils daily as needed for allergies or rhinitis.  . hydrochlorothiazide (HYDRODIURIL) 25 MG tablet Take 25 mg by mouth daily. 1/2 tablet am, 1/2 tablet pm  . levothyroxine (SYNTHROID, LEVOTHROID) 100 MCG tablet Take 100 mcg by mouth daily before breakfast.  . loratadine (CLARITIN) 10 MG tablet Take 10 mg by mouth daily as needed for allergies.   Marland Kitchen losartan (COZAAR) 100 MG tablet Take 100 mg by mouth daily.   . Melatonin 10 MG CAPS Take 10 mg by mouth at bedtime.  . Multiple Vitamins-Minerals (PRESERVISION AREDS 2 PO) Take 1 tablet by mouth at bedtime.   . sertraline (ZOLOFT) 50 MG tablet Take 50 mg by mouth daily.  . sodium chloride (OCEAN) 0.65 % SOLN nasal spray Place 1 spray into both nostrils as needed for congestion.   Current Facility-Administered Medications (Other)  Medication Route  . Bevacizumab (AVASTIN) SOLN 1.25 mg Intravitreal  . Bevacizumab (AVASTIN) SOLN 1.25 mg Intravitreal  . Bevacizumab (AVASTIN) SOLN 1.25 mg Intravitreal      REVIEW OF SYSTEMS: ROS    Positive for: Genitourinary, Musculoskeletal, Eyes   Negative for: Constitutional, Gastrointestinal, Neurological, Skin, HENT, Endocrine, Cardiovascular, Respiratory, Psychiatric, Allergic/Imm, Heme/Lymph   Last  edited by Matthew Folks, COA on 08/20/2019  2:08 PM. (History)       ALLERGIES Allergies  Allergen Reactions  . Trilyte [Peg 3350-Kcl-Na Bicarb-Nacl]     HEMATEMESIS  . Vicodin [Hydrocodone-Acetaminophen] Nausea And Vomiting    PAST MEDICAL HISTORY Past Medical History:  Diagnosis Date  . Anxiety and depression   . Hypercholesterolemia   . Hypertension   . Hypothyroidism   . Macular degeneration    OD  . Osteoarthritis   . Seasonal allergies    Past Surgical History:  Procedure Laterality Date  . BIOPSY  10/01/2018   Procedure: BIOPSY;  Surgeon: Danie Binder, MD;  [facility];  Service: Endoscopy;;  gastric bx & gastric polyp  . COLONOSCOPY N/A 10/01/2018   Procedure: COLONOSCOPY;  Surgeon: Danie Binder, MD;  [facility]: [facility];  Service: Endoscopy;  Laterality: N/A;  10:30am  . ESOPHAGOGASTRODUODENOSCOPY N/A 10/01/2018   Procedure: ESOPHAGOGASTRODUODENOSCOPY (EGD);  Surgeon: Danie Binder, MD;  Location: [facility];  Service: Endoscopy;  Laterality: N/A;  . POLYPECTOMY  10/01/2018   Procedure: POLYPECTOMY;  Surgeon: Danie Binder, MD;  Location: [facility];  Service: Endoscopy;;  colon   . TUBAL LIGATION    . UTERINE FIBROID SURGERY      FAMILY HISTORY Family History  Problem Relation Age of Onset  . Cancer Mother        lung  . Stroke Father   . Stroke Brother   . Colon cancer Neg Hx     SOCIAL HISTORY Social History   Tobacco Use  . Smoking status: Former Smoker    Types: Cigarettes  . Smokeless tobacco: Never Used  Substance Use Topics  . Alcohol use: No  . Drug use: No         OPHTHALMIC EXAM:  Base Eye Exam    Visual Acuity (Snellen - Linear)      Right Left   Dist cc 20/40 20/20 -2   Dist ph cc NI    Correction: Glasses       Tonometry (Tonopen, 2:10 PM)      Right Left   Pressure 11 11       Pupils      Dark Light Shape React APD   Right 3 2 Round Brisk None   Left 3 2 Round Brisk None       Visual Fields (Counting fingers)      Left Right    Full Full       Extraocular Movement      Right Left    Full, Ortho Full, Ortho       Neuro/Psych    Oriented x3: Yes   Mood/Affect: Normal       Dilation    Right eye: 1.0% Mydriacyl, 2.5% Phenylephrine @ 2:10 PM  Pt requests to only dilate right eye due to not having a driver today.        Slit Lamp and Fundus Exam    Slit Lamp Exam      Right Left   Lids/Lashes Dermatochalasis - upper lid Dermatochalasis - upper lid   Conjunctiva/Sclera White and quiet White and quiet   Cornea Arcus, early nasal band K, 1+PEE Arcus, early nasal band K,  1+PEE   Anterior Chamber moderate depth, with narrow angle  moderate depth, with narrow angle temporally   Iris Round and well dilated Round and well dilated   Lens 2-3+ Nuclear sclerosis, 2-3+ Cortical cataract 2+  Nuclear sclerosis, 2+ Cortical cataract   Vitreous Vitreous syneresis, Posterior vitreous detachment, Weiss ring Vitreous syneresis       Fundus Exam      Right Left   Disc Pink and Sharp    C/D Ratio 0.3 0.4   Macula Blunted foveal reflex; persistent SRF -- interval improvement; drusen; +RPE mottling and clumping    Vessels Mild AV crossing changes, Tortuous, Vascular attenuation    Periphery Attached, peripheral drusen / RPE changes nasally, peripheral cystoid degeneration           IMAGING AND PROCEDURES  Imaging and Procedures for 12/11/17  OCT, Retina - OU - Both Eyes       Right Eye Quality was good. Central Foveal Thickness: 322. Progression has improved. Findings include subretinal fluid, no IRF, retinal drusen , pigment epithelial detachment, subretinal hyper-reflective material, normal foveal contour, outer retinal atrophy (Persistent SRF -- mild interval improvement; ratty photo receptors).   Left Eye Quality was good. Central Foveal Thickness: 274. Progression has been stable. Findings include normal foveal contour, no IRF, no SRF, retinal drusen .   Notes *Images captured and stored on drive  Diagnosis / Impression:  OD: Persistent SRF--mild interval improvement; ratty photo receptors OS: NFP, No IRF/SRF  Clinical management:  See below  Abbreviations: NFP - Normal foveal profile. CME - cystoid macular edema. PED - pigment epithelial detachment. IRF - intraretinal fluid. SRF - subretinal fluid. EZ - ellipsoid zone. ERM - epiretinal membrane. ORA - outer retinal atrophy. ORT - outer retinal tubulation. SRHM - subretinal hyper-reflective material         Intravitreal Injection, Pharmacologic Agent - OD - Right Eye       Time Out 08/20/2019.  2:42 PM. Confirmed correct patient, procedure, site, and patient consented.   Anesthesia Topical anesthesia was used. Anesthetic medications included Lidocaine 2%, Proparacaine 0.5%.   Procedure Preparation included 5% betadine to ocular surface, eyelid speculum. A supplied (32 g) needle was used.   Injection:  2 mg aflibercept Alfonse Flavors) SOLN   NDC: A3590391, Lot: [id number], Expiration date: 02/26/2020   Route: Intravitreal, Site: Right Eye, Waste: 0.05 mL  Post-op Post injection exam found visual acuity of at least counting fingers. The patient tolerated the procedure well. There were no complications. The patient received written and verbal post procedure care education.                 ASSESSMENT/PLAN:    ICD-10-CM   1. Exudative age-related macular degeneration of right eye with active choroidal neovascularization (HCC)  H35.3211 Intravitreal Injection, Pharmacologic Agent - OD - Right Eye    aflibercept (EYLEA) SOLN 2 mg  2. Central serous chorioretinopathy of right eye  H35.711   3. Retinal edema  H35.81 OCT, Retina - OU - Both Eyes  4. Essential hypertension  I10   5. Hypertensive retinopathy of both eyes  H35.033   6. Lattice degeneration of left retina  H35.412   7. Posterior vitreous detachment of right eye  H43.811   8. Combined forms of age-related cataract of both eyes  H25.813     1-3. Exudative age related macular degeneration vs CSCR OD    - repeat FA (03.02.19) without leakage / pooling into area of SRF  - review of systems reveals significant stressors in life, but no exogenous steroid use  - discussed treatment with anti-VEGF vs po Eplerenone  - pt unable to afford Eplerenone   - S/P IVA #1 OD (09.27.19),  #2 (10.25.19), #3 (11.22.19) --  minimal response  - pt approved for [employer] and IVE  - S/P IVE OD #1 (12.20.19), #2 (01.20.20), #3 (02.18.20), #4 (07.20.20) #5 (09.01.20), #6 (10.13.20), #7 (11.20.20)  - lost to f/u from Feb 2020 to July 2020 due  to COVID-19 concerns  - today, VA stable at 20/40 OD   - OCT today with improved SRF  - recommend IVE OD #8 today, 12.22.20, and re-discussed possible addition of po eplerenone (50 mg daily) -- pt unable to take medication because it effects her kidneys  - pt wishes to proceed with IVE OD  - RBA of procedure discussed, questions answered  - informed consent obtained and signed  - see procedure note  - [email or web address] benefits investigation initiated -- approved for 2020, and pt states she did phone renewal for 2021  - f/u 4 weeks -- DFE/OCT/ possible injection  4,5. Hypertensive retinopathy OU  - discussed importance of tight BP control  - stable  - monitor   6. Lattice degeneration OS-  - patch of inferior lattice OS -- no RT, holes or SRF  - discussed findings, prognosis, and treatment options including observation  - monitor  7. PVD / vitreous syneresis OD-   Discussed findings and prognosis  No RT or RD on 360 peripheral exam  Reviewed s/s of RT/RD  Strict return precautions for any such RT/RD signs/symptoms  8. Combined form age-related cataract OU-   - The symptoms of cataract, surgical options, and treatments and risks were discussed with patient.  - discussed diagnosis and progression  - not yet visually significant  - monitor for now   Ophthalmic Meds Ordered this visit:  Meds ordered this encounter  Medications  . aflibercept (EYLEA) SOLN 2 mg       Return in about 4 weeks (around 09/17/2019) for f/u ex AMD / CSR OD--Dilated Exam, OCT, Possible Injxn.  There are no Patient Instructions on file for this visit.   Explained the diagnoses, plan, and follow up with the patient and they expressed understanding.  Patient expressed understanding of the importance of proper follow up care.   This document serves as a record of services personally performed by Gardiner Sleeper, MD, PhD. It was created on their behalf by Estill Bakes, COT an ophthalmic technician. The creation  of this record is the provider's dictation and/or activities during the visit.    Electronically signed by: Estill Bakes, COT 08/19/19 @ 9:06 AM   This document serves as a record of services personally performed by Gardiner Sleeper, MD, PhD. It was created on their behalf by Ernest Mallick, OA, an ophthalmic assistant. The creation of this record is the provider's dictation and/or activities during the visit.    Electronically signed by: Ernest Mallick, OA 12.22.2020 9:06 AM  Gardiner Sleeper, M.D., Ph.D. Diseases & Surgery of the Retina and [place] 08/20/2019   I have reviewed the above documentation for accuracy and completeness, and I agree with the above. Gardiner Sleeper, M.D., Ph.D. 08/21/19 9:06 AM    Abbreviations: M myopia (nearsighted); A astigmatism; H hyperopia (farsighted); P presbyopia; Mrx spectacle prescription;  CTL contact lenses; OD right eye; OS left eye; OU both eyes  XT exotropia; ET esotropia; PEK punctate epithelial keratitis; PEE punctate epithelial erosions; DES dry eye syndrome; MGD meibomian gland dysfunction; ATs artificial tears; PFAT's preservative free artificial tears; [place] nuclear sclerotic cataract; PSC posterior subcapsular cataract; ERM epi-retinal membrane; PVD posterior vitreous detachment; RD retinal detachment; DM  diabetes mellitus; DR diabetic retinopathy; NPDR non-proliferative diabetic retinopathy; PDR proliferative diabetic retinopathy; CSME clinically significant macular edema; DME diabetic macular edema; dbh dot blot hemorrhages; CWS cotton wool spot; POAG primary open angle glaucoma; C/D cup-to-disc ratio; HVF humphrey visual field; GVF goldmann visual field; OCT optical coherence tomography; IOP intraocular pressure; BRVO Branch retinal vein occlusion; CRVO central retinal vein occlusion; CRAO central retinal artery occlusion; BRAO branch retinal artery occlusion; RT retinal tear; SB scleral buckle; PPV pars plana  vitrectomy; VH Vitreous hemorrhage; PRP panretinal laser photocoagulation; IVK intravitreal kenalog; VMT vitreomacular traction; MH Macular hole;  NVD neovascularization of the disc; NVE neovascularization elsewhere; AREDS age related eye disease study; ARMD age related macular degeneration; POAG primary open angle glaucoma; EBMD epithelial/anterior basement membrane dystrophy; ACIOL anterior chamber intraocular lens; IOL intraocular lens; PCIOL posterior chamber intraocular lens; Phaco/IOL phacoemulsification with intraocular lens placement; [place] photorefractive keratectomy; LASIK laser assisted in situ keratomileusis; HTN hypertension; DM diabetes mellitus; COPD chronic obstructive pulmonary disease

## 2019-08-20 ENCOUNTER — Ambulatory Visit (INDEPENDENT_AMBULATORY_CARE_PROVIDER_SITE_OTHER): Payer: Medicare HMO | Admitting: Ophthalmology

## 2019-08-20 ENCOUNTER — Encounter (INDEPENDENT_AMBULATORY_CARE_PROVIDER_SITE_OTHER): Payer: Self-pay | Admitting: Ophthalmology

## 2019-08-20 DIAGNOSIS — H353211 Exudative age-related macular degeneration, right eye, with active choroidal neovascularization: Secondary | ICD-10-CM | POA: Diagnosis not present

## 2019-08-20 DIAGNOSIS — H35033 Hypertensive retinopathy, bilateral: Secondary | ICD-10-CM

## 2019-08-20 DIAGNOSIS — H3581 Retinal edema: Secondary | ICD-10-CM

## 2019-08-20 DIAGNOSIS — H43811 Vitreous degeneration, right eye: Secondary | ICD-10-CM

## 2019-08-20 DIAGNOSIS — I10 Essential (primary) hypertension: Secondary | ICD-10-CM | POA: Diagnosis not present

## 2019-08-20 DIAGNOSIS — H25813 Combined forms of age-related cataract, bilateral: Secondary | ICD-10-CM

## 2019-08-20 DIAGNOSIS — H35412 Lattice degeneration of retina, left eye: Secondary | ICD-10-CM

## 2019-08-20 DIAGNOSIS — H35711 Central serous chorioretinopathy, right eye: Secondary | ICD-10-CM | POA: Diagnosis not present

## 2019-08-21 ENCOUNTER — Encounter (INDEPENDENT_AMBULATORY_CARE_PROVIDER_SITE_OTHER): Payer: Self-pay | Admitting: Ophthalmology

## 2019-08-21 MED ORDER — AFLIBERCEPT 2MG/0.05ML IZ SOLN FOR KALEIDOSCOPE
2.0000 mg | INTRAVITREAL | Status: AC | PRN
  Administered 2019-08-21: 09:00:00 2 mg via INTRAVITREAL

## 2019-09-16 NOTE — Progress Notes (Signed)
[facility] Note  09/18/2019      CHIEF COMPLAINT Patient presents for Retina Follow Up   HISTORY OF PRESENT ILLNESS: Darlene Crawford is a 79 y.o. female who presents to the clinic today for:   HPI    Retina Follow Up    Patient presents with  Wet AMD.  In right eye.  This started weeks ago.  Severity is moderate.  Duration of weeks.  I, the attending physician,  performed the HPI with the patient and updated documentation appropriately.          Comments    Pt states her vision is stable OU.  Patient denies eye pain or discomfort OU.  Patient had a few new floaters OD but went away quickly.  Patient denies flashes of light OU.       Last edited by Bernarda Caffey, MD on 09/18/2019  1:51 PM. (History)    pt states she started noticing improvement in her vision about a week or so ago   Referring physician: Monico Blitz, MD [place],  [place] [address]  HISTORICAL INFORMATION:   Selected notes from the MEDICAL RECORD NUMBER Referred by Dr. Leander Rams for concern of ARMD OD with progression to CNVM vs CSR;  LEE- 01.29.19 (R. Davis) [BCVA OD: 20/100 OS: 20/30] Ocular Hx- AMD OU, cataract OU, DES OU, HTN retinopathy OU, WWP OU, choroidal nevus OD;  PMH- elevated chol, HTN, hyperthyoridism    CURRENT MEDICATIONS: Current Outpatient Medications (Ophthalmic Drugs)  Medication Sig  . ARTIFICIAL TEAR SOLUTION OP Place 1 drop into both eyes daily as needed (dry eyes).   Current Facility-Administered Medications (Ophthalmic Drugs)  Medication Route  . aflibercept (EYLEA) SOLN 2 mg Intravitreal  . aflibercept (EYLEA) SOLN 2 mg Intravitreal  . aflibercept (EYLEA) SOLN 2 mg Intravitreal   Current Outpatient Medications (Other)  Medication Sig  . acidophilus (RISAQUAD) CAPS capsule Take 1 capsule by mouth daily.  Marland Kitchen atorvastatin (LIPITOR) 10 MG tablet Take 10 mg by mouth at bedtime.   . Cholecalciferol (VITAMIN D) 50 MCG (2000 UT) tablet Take 4,000  Units by mouth at bedtime.   . Cyanocobalamin (B-12 PO) Take 1 Dose by mouth 2 (two) times a week.  . fluticasone (FLONASE) 50 MCG/ACT nasal spray Place 1 spray into both nostrils daily as needed for allergies or rhinitis.  . hydrochlorothiazide (HYDRODIURIL) 25 MG tablet Take 25 mg by mouth daily. 1/2 tablet am, 1/2 tablet pm  . levothyroxine (SYNTHROID, LEVOTHROID) 100 MCG tablet Take 100 mcg by mouth daily before breakfast.  . loratadine (CLARITIN) 10 MG tablet Take 10 mg by mouth daily as needed for allergies.   Marland Kitchen losartan (COZAAR) 100 MG tablet Take 100 mg by mouth daily.   . Melatonin 10 MG CAPS Take 10 mg by mouth at bedtime.  . Multiple Vitamins-Minerals (PRESERVISION AREDS 2 PO) Take 1 tablet by mouth at bedtime.   . sertraline (ZOLOFT) 50 MG tablet Take 50 mg by mouth daily.  . sodium chloride (OCEAN) 0.65 % SOLN nasal spray Place 1 spray into both nostrils as needed for congestion.   Current Facility-Administered Medications (Other)  Medication Route  . Bevacizumab (AVASTIN) SOLN 1.25 mg Intravitreal  . Bevacizumab (AVASTIN) SOLN 1.25 mg Intravitreal  . Bevacizumab (AVASTIN) SOLN 1.25 mg Intravitreal      REVIEW OF SYSTEMS: ROS    Positive for: Genitourinary, Musculoskeletal, Eyes   Negative for: Constitutional, Gastrointestinal, Neurological, Skin, HENT, Endocrine, Cardiovascular, Respiratory, Psychiatric,  Allergic/Imm, Heme/Lymph   Last edited by Doneen Poisson on 09/18/2019  1:12 PM. (History)       ALLERGIES Allergies  Allergen Reactions  . Trilyte [Peg 3350-Kcl-Na Bicarb-Nacl]     HEMATEMESIS  . Vicodin [Hydrocodone-Acetaminophen] Nausea And Vomiting    PAST MEDICAL HISTORY Past Medical History:  Diagnosis Date  . Anxiety and depression   . Hypercholesterolemia   . Hypertension   . Hypothyroidism   . Macular degeneration    OD  . Osteoarthritis   . Seasonal allergies    Past Surgical History:  Procedure Laterality Date  . BIOPSY  10/01/2018    Procedure: BIOPSY;  Surgeon: Danie Binder, MD;  [facility];  Service: Endoscopy;;  gastric bx & gastric polyp  . COLONOSCOPY N/A 10/01/2018   Procedure: COLONOSCOPY;  Surgeon: Danie Binder, MD;  [facility]: [facility];  Service: Endoscopy;  Laterality: N/A;  10:30am  . ESOPHAGOGASTRODUODENOSCOPY N/A 10/01/2018   Procedure: ESOPHAGOGASTRODUODENOSCOPY (EGD);  Surgeon: Danie Binder, MD;  [facility]: [facility];  Service: Endoscopy;  Laterality: N/A;  . POLYPECTOMY  10/01/2018   Procedure: POLYPECTOMY;  Surgeon: Danie Binder, MD;  [facility]: [facility];  Service: Endoscopy;;  colon   . TUBAL LIGATION    . UTERINE FIBROID SURGERY      FAMILY HISTORY Family History  Problem Relation Age of Onset  . Cancer Mother        lung  . Stroke Father   . Stroke Brother   . Colon cancer Neg Hx     SOCIAL HISTORY Social History   Tobacco Use  . Smoking status: Former Smoker    Types: Cigarettes  . Smokeless tobacco: Never Used  Substance Use Topics  . Alcohol use: No  . Drug use: No         OPHTHALMIC EXAM:  Base Eye Exam    Visual Acuity (Snellen - Linear)      Right Left   Dist cc 20/30 -2 20/20 -1   Dist ph cc NI    Correction: Glasses       Tonometry (Tonopen, 1:15 PM)      Right Left   Pressure 13 13       Pupils      Dark Light Shape React APD   Right 3 2 Round Brisk 0   Left 3 2 Round Brisk 0       Visual Fields      Left Right    Full Full       Extraocular Movement      Right Left    Full Full       Neuro/Psych    Oriented x3: Yes   Mood/Affect: Normal       Dilation    Right eye: 1.0% Mydriacyl, 2.5% Phenylephrine @ 1:15 PM  Dilate OD only per pt request--does not have a driver today        Slit Lamp and Fundus Exam    Slit Lamp Exam      Right Left   Lids/Lashes Dermatochalasis - upper lid Dermatochalasis - upper lid   Conjunctiva/Sclera White and quiet White and quiet   Cornea Arcus, early nasal band K, 1+PEE Arcus,  early nasal band K, 1+PEE   Anterior Chamber moderate depth, with narrow angle  moderate depth, with narrow angle temporally   Iris Round and well dilated Round and well dilated   Lens 2-3+ Nuclear sclerosis, 2-3+ Cortical cataract 2+ Nuclear sclerosis,  2+ Cortical cataract   Vitreous Vitreous syneresis, Posterior vitreous detachment, Weiss ring Vitreous syneresis       Fundus Exam      Right Left   Disc Pink and Sharp    C/D Ratio 0.3 0.4   Macula improved foveal reflex; SRF resolved; drusen; +RPE mottling and clumping    Vessels Vascular attenuation, Tortuous    Periphery Attached, peripheral drusen / RPE changes nasally, peripheral cystoid degeneration         Refraction    Wearing Rx      Sphere Cylinder Axis Add   Right +2.00 +1.25 158 +2.50   Left +2.00 +0.75 002 +2.50   Type: PAL          IMAGING AND PROCEDURES  Imaging and Procedures for 12/11/17  OCT, Retina - OU - Both Eyes       Right Eye Quality was good. Central Foveal Thickness: 322. Progression has improved. Findings include no IRF, retinal drusen , pigment epithelial detachment, subretinal hyper-reflective material, normal foveal contour, outer retinal atrophy, no SRF (interval improvement in SRF -- basically resolved; mild residual SRHM).   Left Eye Quality was good. Central Foveal Thickness: 274. Progression has been stable. Findings include normal foveal contour, no IRF, no SRF, retinal drusen .   Notes *Images captured and stored on drive  Diagnosis / Impression:  OD: interval improvement in SRF -- basically resolved; mild residual SRHM OS: NFP, No IRF/SRF  Clinical management:  See below  Abbreviations: NFP - Normal foveal profile. CME - cystoid macular edema. PED - pigment epithelial detachment. IRF - intraretinal fluid. SRF - subretinal fluid. EZ - ellipsoid zone. ERM - epiretinal membrane. ORA - outer retinal atrophy. ORT - outer retinal tubulation. SRHM - subretinal hyper-reflective  material         Intravitreal Injection, Pharmacologic Agent - OD - Right Eye       Time Out 09/18/2019. 1:42 PM. Confirmed correct patient, procedure, site, and patient consented.   Anesthesia Topical anesthesia was used. Anesthetic medications included Lidocaine 2%, Proparacaine 0.5%.   Procedure Preparation included 5% betadine to ocular surface, eyelid speculum. A (32 g) needle was used.   Injection:  2 mg aflibercept Alfonse Flavors) SOLN   NDC: [id number], Lot: [id number], Expiration date: 09/18/2018   Route: Intravitreal, Site: Right Eye, Waste: 0.05 mL  Post-op Post injection exam found visual acuity of at least counting fingers. The patient tolerated the procedure well. There were no complications. The patient received written and verbal post procedure care education.                 ASSESSMENT/PLAN:    ICD-10-CM   1. Exudative age-related macular degeneration of right eye with active choroidal neovascularization (HCC)  H35.3211 Intravitreal Injection, Pharmacologic Agent - OD - Right Eye    aflibercept (EYLEA) SOLN 2 mg  2. Central serous chorioretinopathy of right eye  H35.711   3. Retinal edema  H35.81 OCT, Retina - OU - Both Eyes  4. Essential hypertension  I10   5. Hypertensive retinopathy of both eyes  H35.033   6. Lattice degeneration of left retina  H35.412   7. Posterior vitreous detachment of right eye  H43.811   8. Combined forms of age-related cataract of both eyes  H25.813     1-3. Exudative age related macular degeneration vs CSCR OD    - repeat FA (03.02.19) without leakage / pooling into area of SRF  - review of systems reveals significant stressors in life,  but no exogenous steroid use  - discussed treatment with anti-VEGF vs po Eplerenone  - pt unable to afford Eplerenone   - S/P IVA #1 OD (09.27.19),  #2 (10.25.19), #3 (11.22.19) -- minimal response  - pt approved for [employer] and IVE  - S/P IVE OD #1 (12.20.19), #2 (01.20.20), #3 (02.18.20),  #4 (07.20.20) #5 (09.01.20), #6 (10.13.20), #7 (11.20.20)  - lost to f/u from Feb 2020 to July 2020 due to COVID-19 concerns  - today, VA slightly improved to 20/30 from 20/40 OD   - OCT today with interval decrease in SRF -- essentially resolved  - recommend IVE OD #8 today, 01.20.21, maintenance  - discussed possible addition of po eplerenone (50 mg daily)  - pt wishes to proceed with IVE OD  - RBA of procedure discussed, questions answered  - informed consent obtained and signed  - see procedure note  - [email or web address] benefits investigation initiated -- approved for 2021  - f/u 4 weeks -- DFE/OCT/ possible injection  4,5. Hypertensive retinopathy OU  - discussed importance of tight BP control  - stable  - monitor  6. Lattice degeneration OS-  - patch of inferior lattice OS -- no RT, holes or SRF  - asymptomatic  - discussed findings, prognosis, and treatment options including observation  - monitor  7. PVD / vitreous syneresis OD-   Discussed findings and prognosis  No RT or RD on 360 peripheral exam  Reviewed s/s of RT/RD  Strict return precautions for any such RT/RD signs/symptoms  8. Combined form age-related cataract OU-   - The symptoms of cataract, surgical options, and treatments and risks were discussed with patient.  - discussed diagnosis and progression  - not yet visually significant  - monitor for now   Ophthalmic Meds Ordered this visit:  Meds ordered this encounter  Medications  . aflibercept (EYLEA) SOLN 2 mg       Return in about 4 weeks (around 10/16/2019) for f/u exu ARMD OD, DFE, OCT.  There are no Patient Instructions on file for this visit.   Explained the diagnoses, plan, and follow up with the patient and they expressed understanding.  Patient expressed understanding of the importance of proper follow up care.   This document serves as a record of services personally performed by Gardiner Sleeper, MD, PhD. It was created on their behalf by Roselee Nova, COMT. The creation of this record is the provider's dictation and/or activities during the visit.  Electronically signed by: Roselee Nova, COMT 09/22/19 2:49 AM   This document serves as a record of services personally performed by Gardiner Sleeper, MD, PhD. It was created on their behalf by Ernest Mallick, OA, an ophthalmic assistant. The creation of this record is the provider's dictation and/or activities during the visit.    Electronically signed by: Ernest Mallick, OA 01.20.2021 2:49 AM  Gardiner Sleeper, M.D., Ph.D. Diseases & Surgery of the Retina and [place] 09/18/2019   I have reviewed the above documentation for accuracy and completeness, and I agree with the above. Gardiner Sleeper, M.D., Ph.D. 09/22/19 2:49 AM    Abbreviations: M myopia (nearsighted); A astigmatism; H hyperopia (farsighted); P presbyopia; Mrx spectacle prescription;  CTL contact lenses; OD right eye; OS left eye; OU both eyes  XT exotropia; ET esotropia; PEK punctate epithelial keratitis; PEE punctate epithelial erosions; DES dry eye syndrome; MGD meibomian gland dysfunction; ATs artificial tears; PFAT's preservative free artificial tears; [place] nuclear  sclerotic cataract; PSC posterior subcapsular cataract; ERM epi-retinal membrane; PVD posterior vitreous detachment; RD retinal detachment; DM diabetes mellitus; DR diabetic retinopathy; NPDR non-proliferative diabetic retinopathy; PDR proliferative diabetic retinopathy; CSME clinically significant macular edema; DME diabetic macular edema; dbh dot blot hemorrhages; CWS cotton wool spot; POAG primary open angle glaucoma; C/D cup-to-disc ratio; HVF humphrey visual field; GVF goldmann visual field; OCT optical coherence tomography; IOP intraocular pressure; BRVO Branch retinal vein occlusion; CRVO central retinal vein occlusion; CRAO central retinal artery occlusion; BRAO branch retinal artery occlusion; RT retinal tear; SB scleral  buckle; PPV pars plana vitrectomy; VH Vitreous hemorrhage; PRP panretinal laser photocoagulation; IVK intravitreal kenalog; VMT vitreomacular traction; MH Macular hole;  NVD neovascularization of the disc; NVE neovascularization elsewhere; AREDS age related eye disease study; ARMD age related macular degeneration; POAG primary open angle glaucoma; EBMD epithelial/anterior basement membrane dystrophy; ACIOL anterior chamber intraocular lens; IOL intraocular lens; PCIOL posterior chamber intraocular lens; Phaco/IOL phacoemulsification with intraocular lens placement; [place] photorefractive keratectomy; LASIK laser assisted in situ keratomileusis; HTN hypertension; DM diabetes mellitus; COPD chronic obstructive pulmonary disease

## 2019-09-18 ENCOUNTER — Ambulatory Visit (INDEPENDENT_AMBULATORY_CARE_PROVIDER_SITE_OTHER): Payer: Medicare HMO | Admitting: Ophthalmology

## 2019-09-18 ENCOUNTER — Encounter (INDEPENDENT_AMBULATORY_CARE_PROVIDER_SITE_OTHER): Payer: Self-pay | Admitting: Ophthalmology

## 2019-09-18 DIAGNOSIS — H25813 Combined forms of age-related cataract, bilateral: Secondary | ICD-10-CM | POA: Diagnosis not present

## 2019-09-18 DIAGNOSIS — H353211 Exudative age-related macular degeneration, right eye, with active choroidal neovascularization: Secondary | ICD-10-CM

## 2019-09-18 DIAGNOSIS — H35711 Central serous chorioretinopathy, right eye: Secondary | ICD-10-CM | POA: Diagnosis not present

## 2019-09-18 DIAGNOSIS — H3581 Retinal edema: Secondary | ICD-10-CM | POA: Diagnosis not present

## 2019-09-18 DIAGNOSIS — I10 Essential (primary) hypertension: Secondary | ICD-10-CM | POA: Diagnosis not present

## 2019-09-18 DIAGNOSIS — H43811 Vitreous degeneration, right eye: Secondary | ICD-10-CM | POA: Diagnosis not present

## 2019-09-18 DIAGNOSIS — H35033 Hypertensive retinopathy, bilateral: Secondary | ICD-10-CM | POA: Diagnosis not present

## 2019-09-18 DIAGNOSIS — H35412 Lattice degeneration of retina, left eye: Secondary | ICD-10-CM

## 2019-09-18 MED ORDER — AFLIBERCEPT 2MG/0.05ML IZ SOLN FOR KALEIDOSCOPE
2.0000 mg | INTRAVITREAL | Status: AC | PRN
  Administered 2019-09-18: 2 mg via INTRAVITREAL

## 2019-09-24 DIAGNOSIS — Z299 Encounter for prophylactic measures, unspecified: Secondary | ICD-10-CM | POA: Diagnosis not present

## 2019-09-24 DIAGNOSIS — E039 Hypothyroidism, unspecified: Secondary | ICD-10-CM | POA: Diagnosis not present

## 2019-09-24 DIAGNOSIS — I10 Essential (primary) hypertension: Secondary | ICD-10-CM | POA: Diagnosis not present

## 2019-09-24 DIAGNOSIS — Z6832 Body mass index (BMI) 32.0-32.9, adult: Secondary | ICD-10-CM | POA: Diagnosis not present

## 2019-09-24 DIAGNOSIS — N183 Chronic kidney disease, stage 3 unspecified: Secondary | ICD-10-CM | POA: Diagnosis not present

## 2019-09-24 DIAGNOSIS — Z789 Other specified health status: Secondary | ICD-10-CM | POA: Diagnosis not present

## 2019-09-24 DIAGNOSIS — R69 Illness, unspecified: Secondary | ICD-10-CM | POA: Diagnosis not present

## 2019-10-15 NOTE — Progress Notes (Signed)
[facility] Note  10/16/2019      CHIEF COMPLAINT Patient presents for Retina Follow Up   HISTORY OF PRESENT ILLNESS: Darlene Crawford is a 79 y.o. female who presents to the clinic today for:   HPI    Retina Follow Up    Patient presents with  Wet AMD.  In right eye.  This started months ago.  Severity is moderate.  Duration of 4 weeks.  Since onset it is gradually improving.          Comments    79 y/o female pt here for 4 wk f/u for exu ARMD OD.  VA OD seems a bit improved.  No change in [place] OS.  Denies pain, flashes, new floaters.  No gtts.       Last edited by Matthew Folks, COA on 10/16/2019  1:24 PM. (History)    pt states her vision may be "a little bit better" than last visit, she states had a floater for about 48 hrs after last injection   Referring physician: Monico Blitz, MD [place],  [place] [address]  HISTORICAL INFORMATION:   Selected notes from the MEDICAL RECORD NUMBER Referred by Dr. Leander Rams for concern of ARMD OD with progression to CNVM vs CSR;  LEE- 01.29.19 (R. Davis) [BCVA OD: 20/100 OS: 20/30] Ocular Hx- AMD OU, cataract OU, DES OU, HTN retinopathy OU, WWP OU, choroidal nevus OD;  PMH- elevated chol, HTN, hyperthyoridism    CURRENT MEDICATIONS: Current Outpatient Medications (Ophthalmic Drugs)  Medication Sig  . ARTIFICIAL TEAR SOLUTION OP Place 1 drop into both eyes daily as needed (dry eyes).   Current Facility-Administered Medications (Ophthalmic Drugs)  Medication Route  . aflibercept (EYLEA) SOLN 2 mg Intravitreal  . aflibercept (EYLEA) SOLN 2 mg Intravitreal  . aflibercept (EYLEA) SOLN 2 mg Intravitreal   Current Outpatient Medications (Other)  Medication Sig  . acidophilus (RISAQUAD) CAPS capsule Take 1 capsule by mouth daily.  Marland Kitchen atorvastatin (LIPITOR) 10 MG tablet Take 10 mg by mouth at bedtime.   . Cholecalciferol (VITAMIN D) 50 MCG (2000 UT) tablet Take 4,000 Units by mouth at bedtime.   .  Cyanocobalamin (B-12 PO) Take 1 Dose by mouth 2 (two) times a week.  . fluticasone (FLONASE) 50 MCG/ACT nasal spray Place 1 spray into both nostrils daily as needed for allergies or rhinitis.  . hydrochlorothiazide (HYDRODIURIL) 25 MG tablet Take 25 mg by mouth daily. 1/2 tablet am, 1/2 tablet pm  . levothyroxine (SYNTHROID, LEVOTHROID) 100 MCG tablet Take 100 mcg by mouth daily before breakfast.  . loratadine (CLARITIN) 10 MG tablet Take 10 mg by mouth daily as needed for allergies.   Marland Kitchen losartan (COZAAR) 100 MG tablet Take 100 mg by mouth daily.   . Melatonin 10 MG CAPS Take 10 mg by mouth at bedtime.  . Multiple Vitamins-Minerals (PRESERVISION AREDS 2 PO) Take 1 tablet by mouth at bedtime.   . sertraline (ZOLOFT) 50 MG tablet Take 50 mg by mouth daily.  . sodium chloride (OCEAN) 0.65 % SOLN nasal spray Place 1 spray into both nostrils as needed for congestion.   Current Facility-Administered Medications (Other)  Medication Route  . Bevacizumab (AVASTIN) SOLN 1.25 mg Intravitreal  . Bevacizumab (AVASTIN) SOLN 1.25 mg Intravitreal  . Bevacizumab (AVASTIN) SOLN 1.25 mg Intravitreal      REVIEW OF SYSTEMS: ROS    Positive for: Eyes   Negative for: Constitutional, Gastrointestinal, Neurological, Skin, Genitourinary, Musculoskeletal, HENT,  Endocrine, Cardiovascular, Respiratory, Psychiatric, Allergic/Imm, Heme/Lymph   Last edited by Matthew Folks, COA on 10/16/2019  1:24 PM. (History)       ALLERGIES Allergies  Allergen Reactions  . Trilyte [Peg 3350-Kcl-Na Bicarb-Nacl]     HEMATEMESIS  . Vicodin [Hydrocodone-Acetaminophen] Nausea And Vomiting    PAST MEDICAL HISTORY Past Medical History:  Diagnosis Date  . Anxiety and depression   . Cataract    Combined forms OU  . Hypercholesterolemia   . Hypertension   . Hypertensive retinopathy    OU  . Hypothyroidism   . Macular degeneration    OD  . Osteoarthritis   . Seasonal allergies    Past Surgical History:  Procedure  Laterality Date  . BIOPSY  10/01/2018   Procedure: BIOPSY;  Surgeon: Danie Binder, MD;  [facility];  Service: Endoscopy;;  gastric bx & gastric polyp  . COLONOSCOPY N/A 10/01/2018   Procedure: COLONOSCOPY;  Surgeon: Danie Binder, MD;  [facility]: [facility];  Service: Endoscopy;  Laterality: N/A;  10:30am  . ESOPHAGOGASTRODUODENOSCOPY N/A 10/01/2018   Procedure: ESOPHAGOGASTRODUODENOSCOPY (EGD);  Surgeon: Danie Binder, MD;  Location: [facility];  Service: Endoscopy;  Laterality: N/A;  . POLYPECTOMY  10/01/2018   Procedure: POLYPECTOMY;  Surgeon: Danie Binder, MD;  [facility]: [facility];  Service: Endoscopy;;  colon   . TUBAL LIGATION    . UTERINE FIBROID SURGERY      FAMILY HISTORY Family History  Problem Relation Age of Onset  . Cancer Mother        lung  . Stroke Father   . Stroke Brother   . Colon cancer Neg Hx     SOCIAL HISTORY Social History   Tobacco Use  . Smoking status: Former Smoker    Types: Cigarettes  . Smokeless tobacco: Never Used  Substance Use Topics  . Alcohol use: No  . Drug use: No         OPHTHALMIC EXAM:  Base Eye Exam    Visual Acuity (Snellen - Linear)      Right Left   Dist cc 20/30 +2 20/20 -   Dist ph cc NI    Correction: Glasses       Tonometry (Tonopen, 1:25 PM)      Right Left   Pressure 12 13       Pupils      Dark Light Shape React APD   Right 3 2 Round Brisk None   Left 3 2 Round Brisk None       Visual Fields (Counting fingers)      Left Right    Full Full       Extraocular Movement      Right Left    Full, Ortho Full, Ortho       Neuro/Psych    Oriented x3: Yes   Mood/Affect: Normal       Dilation    Both eyes: 1.0% Mydriacyl, 2.5% Phenylephrine @ 1:25 PM        Slit Lamp and Fundus Exam    Slit Lamp Exam      Right Left   Lids/Lashes Dermatochalasis - upper lid Dermatochalasis - upper lid   Conjunctiva/Sclera White and quiet White and quiet   Cornea Arcus, early nasal band  K, 1+PEE Arcus, early nasal band K, 1+PEE   Anterior Chamber moderate depth, with narrow angle  moderate depth, with narrow angle temporally   Iris Round and well dilated Round and  well dilated   Lens 2-3+ Nuclear sclerosis, 2-3+ Cortical cataract 2+ Nuclear sclerosis, 2+ Cortical cataract   Vitreous Vitreous syneresis, Posterior vitreous detachment, Weiss ring Vitreous syneresis       Fundus Exam      Right Left   Disc Pink and Sharp Pink and Sharp, Tilted disc   C/D Ratio 0.3 0.4   Macula Good foveal reflex; interval, mild recurrence of SRF focal area IN macula; drusen; +RPE mottling and clumping Flat, good foveal reflex, Retinal pigment epithelial mottling, superior para-foveal focal area of RPE atrophy -- stable, rare drusen, No heme or edema   Vessels Vascular attenuation, Tortuous Copper wiring, AV crossing changes, mildly Tortuous   Periphery Attached, peripheral drusen / RPE changes nasally, peripheral cystoid degeneration Patch of Lattice degeneration at 0600, scattered peripheral drusen, peripheral cystoid degeneration          IMAGING AND PROCEDURES  Imaging and Procedures for 12/11/17  OCT, Retina - OU - Both Eyes       Right Eye Quality was good. Central Foveal Thickness: 253. Progression has worsened. Findings include no IRF, retinal drusen , pigment epithelial detachment, subretinal hyper-reflective material, normal foveal contour, outer retinal atrophy, subretinal fluid (Interval recurrence of SRF; persistent residual SRHM).   Left Eye Quality was good. Central Foveal Thickness: 276. Progression has been stable. Findings include normal foveal contour, no IRF, no SRF, retinal drusen .   Notes *Images captured and stored on drive  Diagnosis / Impression:  OD: Interval recurrence of SRF; persistent residual SRHM OS: NFP, No IRF/SRF  Clinical management:  See below  Abbreviations: NFP - Normal foveal profile. CME - cystoid macular edema. PED - pigment epithelial  detachment. IRF - intraretinal fluid. SRF - subretinal fluid. EZ - ellipsoid zone. ERM - epiretinal membrane. ORA - outer retinal atrophy. ORT - outer retinal tubulation. SRHM - subretinal hyper-reflective material         Intravitreal Injection, Pharmacologic Agent - OD - Right Eye       Time Out 10/16/2019. 2:08 PM. Confirmed correct patient, procedure, site, and patient consented.   Anesthesia Topical anesthesia was used. Anesthetic medications included Lidocaine 2%, Proparacaine 0.5%.   Procedure Preparation included 5% betadine to ocular surface, eyelid speculum. A (32 g) needle was used.   Injection:  2 mg aflibercept Alfonse Flavors) SOLN   NDC: [id number], Lot: [id number], Expiration date: 03/24/2020   Route: Intravitreal, Site: Right Eye, Waste: 0.05 mL  Post-op Post injection exam found visual acuity of at least counting fingers. The patient tolerated the procedure well. There were no complications. The patient received written and verbal post procedure care education.                 ASSESSMENT/PLAN:    ICD-10-CM   1. Exudative age-related macular degeneration of right eye with active choroidal neovascularization (HCC)  H35.3211 Intravitreal Injection, Pharmacologic Agent - OD - Right Eye    aflibercept (EYLEA) SOLN 2 mg    CANCELED: Intravitreal Injection, Pharmacologic Agent - OS - Left Eye  2. Central serous chorioretinopathy of right eye  H35.711   3. Retinal edema  H35.81 OCT, Retina - OU - Both Eyes  4. Essential hypertension  I10   5. Hypertensive retinopathy of both eyes  H35.033   6. Lattice degeneration of left retina  H35.412   7. Posterior vitreous detachment of right eye  H43.811   8. Combined forms of age-related cataract of both eyes  H25.813     1-3. Exudative age  related macular degeneration vs CSCR OD    - FA (03.02.19) without leakage / pooling into area of SRF  - review of systems reveals significant stressors in life, but no exogenous steroid  use  - discussed treatment with anti-VEGF vs po Eplerenone  - pt unable to afford Eplerenone   - S/P IVA #1 OD (09.27.19),  #2 (10.25.19), #3 (11.22.19) -- minimal response  - pt approved for [employer] and IVE  - S/P IVE OD #1 (12.20.19), #2 (01.20.20), #3 (02.18.20), #4 (07.20.20) #5 (09.01.20), #6 (10.13.20), #7 (11.20.20), #8 (01.20.21)  - lost to f/u from Feb 2020 to July 2020 due to COVID-19 concerns  - today, VA stable at 20/30 OD   - OCT today with mild interval recurrence of SRF  - recommend IVE OD #9 today, 02.17.21  - discussed possible addition of po eplerenone (50 mg daily)  - pt wishes to proceed with IVE OD  - RBA of procedure discussed, questions answered  - informed consent obtained   - Eylea informed consent form signed and scanned on 12.22.2020  - see procedure note  - [email or web address] benefits investigation initiated -- approved for 2021  - f/u 4 weeks -- DFE/OCT/ possible injection  4,5. Hypertensive retinopathy OU  - discussed importance of tight BP control  - stable  - monitor  6. Lattice degeneration OS-  - patch of inferior lattice OS -- no RT, holes or SRF  - asymptomatic  - discussed findings, prognosis, and treatment options including observation  - monitor  7. PVD / vitreous syneresis OD-   Discussed findings and prognosis  No RT or RD on 360 peripheral exam  Reviewed s/s of RT/RD  Strict return precautions for any such RT/RD signs/symptoms  8. Combined form age-related cataract OU-   - The symptoms of cataract, surgical options, and treatments and risks were discussed with patient.  - discussed diagnosis and progression  - not yet visually significant  - monitor for now   Ophthalmic Meds Ordered this visit:  Meds ordered this encounter  Medications  . aflibercept (EYLEA) SOLN 2 mg       Return in about 4 weeks (around 11/13/2019) for Dilated Exam, OCT, Possible Injxn.  There are no Patient Instructions on file for this visit.   Explained the  diagnoses, plan, and follow up with the patient and they expressed understanding.  Patient expressed understanding of the importance of proper follow up care.   This document serves as a record of services personally performed by Gardiner Sleeper, MD, PhD. It was created on their behalf by Roselee Nova, COMT. The creation of this record is the provider's dictation and/or activities during the visit.  Electronically signed by: Roselee Nova, COMT 10/16/19 4:29 PM   This document serves as a record of services personally performed by Gardiner Sleeper, MD, PhD. It was created on their behalf by Ernest Mallick, OA, an ophthalmic assistant. The creation of this record is the provider's dictation and/or activities during the visit.    Electronically signed by: Ernest Mallick, OA @TODAY @ 4:29 PM  Gardiner Sleeper, M.D., Ph.D. Diseases & Surgery of the Retina and [place] 10/16/2019   I have reviewed the above documentation for accuracy and completeness, and I agree with the above. Gardiner Sleeper, M.D., Ph.D. 10/16/19 4:29 PM   Abbreviations: M myopia (nearsighted); A astigmatism; H hyperopia (farsighted); P presbyopia; Mrx spectacle prescription;  CTL contact lenses; OD right eye; OS left eye;  OU both eyes  XT exotropia; ET esotropia; PEK punctate epithelial keratitis; PEE punctate epithelial erosions; DES dry eye syndrome; MGD meibomian gland dysfunction; ATs artificial tears; PFAT's preservative free artificial tears; [place] nuclear sclerotic cataract; PSC posterior subcapsular cataract; ERM epi-retinal membrane; PVD posterior vitreous detachment; RD retinal detachment; DM diabetes mellitus; DR diabetic retinopathy; NPDR non-proliferative diabetic retinopathy; PDR proliferative diabetic retinopathy; CSME clinically significant macular edema; DME diabetic macular edema; dbh dot blot hemorrhages; CWS cotton wool spot; POAG primary open angle glaucoma; C/D cup-to-disc ratio; HVF  humphrey visual field; GVF goldmann visual field; OCT optical coherence tomography; IOP intraocular pressure; BRVO Branch retinal vein occlusion; CRVO central retinal vein occlusion; CRAO central retinal artery occlusion; BRAO branch retinal artery occlusion; RT retinal tear; SB scleral buckle; PPV pars plana vitrectomy; VH Vitreous hemorrhage; PRP panretinal laser photocoagulation; IVK intravitreal kenalog; VMT vitreomacular traction; MH Macular hole;  NVD neovascularization of the disc; NVE neovascularization elsewhere; AREDS age related eye disease study; ARMD age related macular degeneration; POAG primary open angle glaucoma; EBMD epithelial/anterior basement membrane dystrophy; ACIOL anterior chamber intraocular lens; IOL intraocular lens; PCIOL posterior chamber intraocular lens; Phaco/IOL phacoemulsification with intraocular lens placement; [place] photorefractive keratectomy; LASIK laser assisted in situ keratomileusis; HTN hypertension; DM diabetes mellitus; COPD chronic obstructive pulmonary disease

## 2019-10-16 ENCOUNTER — Encounter (INDEPENDENT_AMBULATORY_CARE_PROVIDER_SITE_OTHER): Payer: Self-pay | Admitting: Ophthalmology

## 2019-10-16 ENCOUNTER — Ambulatory Visit (INDEPENDENT_AMBULATORY_CARE_PROVIDER_SITE_OTHER): Payer: Medicare HMO | Admitting: Ophthalmology

## 2019-10-16 DIAGNOSIS — H25813 Combined forms of age-related cataract, bilateral: Secondary | ICD-10-CM

## 2019-10-16 DIAGNOSIS — H353211 Exudative age-related macular degeneration, right eye, with active choroidal neovascularization: Secondary | ICD-10-CM

## 2019-10-16 DIAGNOSIS — H35033 Hypertensive retinopathy, bilateral: Secondary | ICD-10-CM | POA: Diagnosis not present

## 2019-10-16 DIAGNOSIS — H43811 Vitreous degeneration, right eye: Secondary | ICD-10-CM | POA: Diagnosis not present

## 2019-10-16 DIAGNOSIS — I10 Essential (primary) hypertension: Secondary | ICD-10-CM

## 2019-10-16 DIAGNOSIS — H35412 Lattice degeneration of retina, left eye: Secondary | ICD-10-CM | POA: Diagnosis not present

## 2019-10-16 DIAGNOSIS — H35711 Central serous chorioretinopathy, right eye: Secondary | ICD-10-CM | POA: Diagnosis not present

## 2019-10-16 DIAGNOSIS — H3581 Retinal edema: Secondary | ICD-10-CM | POA: Diagnosis not present

## 2019-10-16 MED ORDER — AFLIBERCEPT 2MG/0.05ML IZ SOLN FOR KALEIDOSCOPE
2.0000 mg | INTRAVITREAL | Status: AC | PRN
  Administered 2019-10-16: 16:00:00 2 mg via INTRAVITREAL

## 2019-11-13 NOTE — Progress Notes (Signed)
[facility] Note  11/15/2019      CHIEF COMPLAINT Patient presents for Retina Follow Up   HISTORY OF PRESENT ILLNESS: Darlene Crawford is a 79 y.o. female who presents to the clinic today for:   HPI    Retina Follow Up    Patient presents with  Wet AMD.  In right eye.  This started 4 weeks ago.  Severity is moderate.  I, the attending physician,  performed the HPI with the patient and updated documentation appropriately.          Comments    Patient here for 4 weeks retina follow up for exu ARMD OD. Patient states vision doing pretty good. Has improved. Patient didn't want OS dilated - driving.  No eye pain.        Last edited by Bernarda Caffey, MD on 11/15/2019 10:35 PM. (History)    pt states vision is improved   Referring physician: Monico Blitz, MD [place],  [place] [address]  HISTORICAL INFORMATION:   Selected notes from the MEDICAL RECORD NUMBER Referred by Dr. Leander Rams for concern of ARMD OD with progression to CNVM vs CSR;  LEE- 01.29.19 (R. Davis) [BCVA OD: 20/100 OS: 20/30] Ocular Hx- AMD OU, cataract OU, DES OU, HTN retinopathy OU, WWP OU, choroidal nevus OD;  PMH- elevated chol, HTN, hyperthyoridism    CURRENT MEDICATIONS: Current Outpatient Medications (Ophthalmic Drugs)  Medication Sig  . ARTIFICIAL TEAR SOLUTION OP Place 1 drop into both eyes daily as needed (dry eyes).   Current Facility-Administered Medications (Ophthalmic Drugs)  Medication Route  . aflibercept (EYLEA) SOLN 2 mg Intravitreal  . aflibercept (EYLEA) SOLN 2 mg Intravitreal  . aflibercept (EYLEA) SOLN 2 mg Intravitreal   Current Outpatient Medications (Other)  Medication Sig  . acidophilus (RISAQUAD) CAPS capsule Take 1 capsule by mouth daily.  Marland Kitchen atorvastatin (LIPITOR) 10 MG tablet Take 10 mg by mouth at bedtime.   . Cholecalciferol (VITAMIN D) 50 MCG (2000 UT) tablet Take 4,000 Units by mouth at bedtime.   . Cyanocobalamin (B-12 PO) Take 1 Dose by  mouth 2 (two) times a week.  . fluticasone (FLONASE) 50 MCG/ACT nasal spray Place 1 spray into both nostrils daily as needed for allergies or rhinitis.  . hydrochlorothiazide (HYDRODIURIL) 25 MG tablet Take 25 mg by mouth daily. 1/2 tablet am, 1/2 tablet pm  . levothyroxine (SYNTHROID, LEVOTHROID) 100 MCG tablet Take 100 mcg by mouth daily before breakfast.  . loratadine (CLARITIN) 10 MG tablet Take 10 mg by mouth daily as needed for allergies.   Marland Kitchen losartan (COZAAR) 100 MG tablet Take 100 mg by mouth daily.   . Melatonin 10 MG CAPS Take 10 mg by mouth at bedtime.  . Multiple Vitamins-Minerals (PRESERVISION AREDS 2 PO) Take 1 tablet by mouth at bedtime.   . sertraline (ZOLOFT) 50 MG tablet Take 50 mg by mouth daily.  . sodium chloride (OCEAN) 0.65 % SOLN nasal spray Place 1 spray into both nostrils as needed for congestion.   Current Facility-Administered Medications (Other)  Medication Route  . Bevacizumab (AVASTIN) SOLN 1.25 mg Intravitreal  . Bevacizumab (AVASTIN) SOLN 1.25 mg Intravitreal  . Bevacizumab (AVASTIN) SOLN 1.25 mg Intravitreal      REVIEW OF SYSTEMS: ROS    Positive for: Genitourinary, Musculoskeletal, Eyes   Negative for: Constitutional, Gastrointestinal, Neurological, Skin, HENT, Endocrine, Cardiovascular, Respiratory, Psychiatric, Allergic/Imm, Heme/Lymph   Last edited by Theodore Demark, COA on 11/15/2019  1:43 PM. (  History)       ALLERGIES Allergies  Allergen Reactions  . Trilyte [Peg 3350-Kcl-Na Bicarb-Nacl]     HEMATEMESIS  . Vicodin [Hydrocodone-Acetaminophen] Nausea And Vomiting    PAST MEDICAL HISTORY Past Medical History:  Diagnosis Date  . Anxiety and depression   . Cataract    Combined forms OU  . Hypercholesterolemia   . Hypertension   . Hypertensive retinopathy    OU  . Hypothyroidism   . Macular degeneration    OD  . Osteoarthritis   . Seasonal allergies    Past Surgical History:  Procedure Laterality Date  . BIOPSY  10/01/2018    Procedure: BIOPSY;  Surgeon: Danie Binder, MD;  [facility];  Service: Endoscopy;;  gastric bx & gastric polyp  . COLONOSCOPY N/A 10/01/2018   Procedure: COLONOSCOPY;  Surgeon: Danie Binder, MD;  [facility]: [facility];  Service: Endoscopy;  Laterality: N/A;  10:30am  . ESOPHAGOGASTRODUODENOSCOPY N/A 10/01/2018   Procedure: ESOPHAGOGASTRODUODENOSCOPY (EGD);  Surgeon: Danie Binder, MD;  Location: [facility];  Service: Endoscopy;  Laterality: N/A;  . POLYPECTOMY  10/01/2018   Procedure: POLYPECTOMY;  Surgeon: Danie Binder, MD;  Location: [facility];  Service: Endoscopy;;  colon   . TUBAL LIGATION    . UTERINE FIBROID SURGERY      FAMILY HISTORY Family History  Problem Relation Age of Onset  . Cancer Mother        lung  . Stroke Father   . Stroke Brother   . Colon cancer Neg Hx     SOCIAL HISTORY Social History   Tobacco Use  . Smoking status: Former Smoker    Types: Cigarettes  . Smokeless tobacco: Never Used  Substance Use Topics  . Alcohol use: No  . Drug use: No         OPHTHALMIC EXAM:  Base Eye Exam    Visual Acuity (Snellen - Linear)      Right Left   Dist cc 20/25 -2 20/25 -2   Dist ph cc NI NI   Correction: Glasses       Tonometry (Tonopen, 1:39 PM)      Right Left   Pressure 11 9       Pupils      Dark Light Shape React APD   Right 3 2 Round Brisk None   Left 3 2 Round Brisk None       Visual Fields (Counting fingers)      Left Right    Full        Extraocular Movement      Right Left    Full, Ortho Full, Ortho       Neuro/Psych    Oriented x3: Yes   Mood/Affect: Normal       Dilation    Right eye: 1.0% Mydriacyl, 2.5% Phenylephrine @ 1:39 PM  Patient only wanted OD dilated. Not OS.        Slit Lamp and Fundus Exam    Slit Lamp Exam      Right Left   Lids/Lashes Dermatochalasis - upper lid Dermatochalasis - upper lid   Conjunctiva/Sclera White and quiet White and quiet   Cornea Arcus, early nasal band  K, 1+PEE Arcus, early nasal band K, 1+PEE   Anterior Chamber moderate depth, with narrow angle  moderate depth, with narrow angle temporally   Iris Round and well dilated Round and well dilated   Lens 2-3+ Nuclear sclerosis, 2-3+ Cortical cataract 2+  Nuclear sclerosis, 2+ Cortical cataract   Vitreous Vitreous syneresis, Posterior vitreous detachment, Weiss ring Vitreous syneresis       Fundus Exam      Right Left   Disc Pink and Sharp Pink and Sharp, Tilted disc   C/D Ratio 0.3 0.4   Macula Good foveal reflex; interval improvement in SRF IN macula; drusen; +RPE mottling and clumping Flat, good foveal reflex, Retinal pigment epithelial mottling, superior para-foveal focal area of RPE atrophy -- stable, rare drusen, No heme or edema   Vessels Vascular attenuation, Tortuous Copper wiring, AV crossing changes, mildly Tortuous   Periphery Attached, peripheral drusen / RPE changes nasally, peripheral cystoid degeneration Patch of Lattice degeneration at 0600, scattered peripheral drusen, peripheral cystoid degeneration        Refraction    Wearing Rx      Sphere Cylinder Axis Add   Right +2.00 +1.25 158 +2.50   Left +2.00 +0.75 002 +2.50   Type: PAL          IMAGING AND PROCEDURES  Imaging and Procedures for 12/11/17  OCT, Retina - OU - Both Eyes       Right Eye Quality was good. Central Foveal Thickness: 251. Progression has improved. Findings include no IRF, retinal drusen , pigment epithelial detachment, subretinal hyper-reflective material, normal foveal contour, outer retinal atrophy, no SRF (Interval improvement SRF).   Left Eye Quality was good. Central Foveal Thickness: 276. Progression has been stable. Findings include normal foveal contour, no IRF, no SRF, retinal drusen .   Notes *Images captured and stored on drive  Diagnosis / Impression:  OD: Interval improvement SRF OS: NFP, No IRF/SRF  Clinical management:  See below  Abbreviations: NFP - Normal foveal  profile. CME - cystoid macular edema. PED - pigment epithelial detachment. IRF - intraretinal fluid. SRF - subretinal fluid. EZ - ellipsoid zone. ERM - epiretinal membrane. ORA - outer retinal atrophy. ORT - outer retinal tubulation. SRHM - subretinal hyper-reflective material         Intravitreal Injection, Pharmacologic Agent - OD - Right Eye       Time Out 11/15/2019. 1:05 PM. Confirmed correct patient, procedure, site, and patient consented.   Anesthesia Topical anesthesia was used. Anesthetic medications included Lidocaine 2%, Proparacaine 0.5%.   Procedure Preparation included 5% betadine to ocular surface, eyelid speculum. A (32g) needle was used.   Injection:  2 mg aflibercept Alfonse Flavors) SOLN   NDC: A3590391, Lot: [id number], Expiration date: 04/28/2020   Route: Intravitreal, Site: Right Eye, Waste: 0.05 mL  Post-op Post injection exam found visual acuity of at least counting fingers. The patient tolerated the procedure well. There were no complications. The patient received written and verbal post procedure care education.                 ASSESSMENT/PLAN:    ICD-10-CM   1. Exudative age-related macular degeneration of right eye with active choroidal neovascularization (HCC)  H35.3211 Intravitreal Injection, Pharmacologic Agent - OD - Right Eye    aflibercept (EYLEA) SOLN 2 mg  2. Central serous chorioretinopathy of right eye  H35.711   3. Retinal edema  H35.81 OCT, Retina - OU - Both Eyes  4. Essential hypertension  I10   5. Hypertensive retinopathy of both eyes  H35.033   6. Lattice degeneration of left retina  H35.412   7. Posterior vitreous detachment of right eye  H43.811   8. Combined forms of age-related cataract of both eyes  H25.813  1-3. Exudative age related macular degeneration vs CSCR OD    - FA (03.02.19) without leakage / pooling into area of SRF  - review of systems reveals significant stressors in life, but no exogenous steroid use  -  discussed treatment with anti-VEGF vs po Eplerenone  - S/P IVA #1 OD (09.27.19),  #2 (10.25.19), #3 (11.22.19) -- minimal response  - pt approved for [employer] and IVE  - discussed possible addition of po eplerenone (50 mg daily)  - S/P IVE OD #1 (12.20.19), #2 (01.20.20), #3 (02.18.20), #4 (07.20.20) #5 (09.01.20), #6 (10.13.20), #7 (11.20.20), #8 (01.20.21), #9 (02.17.21)  - lost to f/u from Feb 2020 to July 2020 due to COVID-19 concerns  - today, VA improved to 20/25 from 20/30 OD   - OCT today with interval improvement SRF  - recommend IVE OD #10 today, 03.19.21  - pt wishes to proceed with IVE OD  - RBA of procedure discussed, questions answered  - informed consent obtained   - Eylea informed consent form signed and scanned on 12.22.2020  - see procedure note  - [email or web address] benefits investigation initiated -- approved for 2021  - f/u 4 weeks -- DFE/OCT/ possible injection  4,5. Hypertensive retinopathy OU  - discussed importance of tight BP control  - stable  - monitor  6. Lattice degeneration OS-  - patch of inferior lattice OS -- no RT, holes or SRF  - asymptomatic  - discussed findings, prognosis, and treatment options including observation  - monitor  7. PVD / vitreous syneresis OD-   - Discussed findings and prognosis  - No RT or RD on 360 peripheral exam  - Reviewed s/s of RT/RD  - Strict return precautions for any such RT/RD signs/symptoms  8. Combined form age-related cataract OU-   - The symptoms of cataract, surgical options, and treatments and risks were discussed with patient.  - discussed diagnosis and progression  - not yet visually significant  - monitor for now   Ophthalmic Meds Ordered this visit:  Meds ordered this encounter  Medications  . aflibercept (EYLEA) SOLN 2 mg       Return in about 4 weeks (around 12/13/2019) for f/u exu ARMD OD, DFE, OCT.  There are no Patient Instructions on file for this visit.   Explained the diagnoses, plan, and  follow up with the patient and they expressed understanding.  Patient expressed understanding of the importance of proper follow up care.   This document serves as a record of services personally performed by Gardiner Sleeper, MD, PhD. It was created on their behalf by Ernest Mallick, OA, an ophthalmic assistant. The creation of this record is the provider's dictation and/or activities during the visit.    Electronically signed by: Ernest Mallick, OA 03.17.2021 10:41 PM  Gardiner Sleeper, M.D., Ph.D. Diseases & Surgery of the Retina and Vitreous [facility]  I have reviewed the above documentation for accuracy and completeness, and I agree with the above. Gardiner Sleeper, M.D., Ph.D. 11/15/19 10:41 PM   Abbreviations: M myopia (nearsighted); A astigmatism; H hyperopia (farsighted); P presbyopia; Mrx spectacle prescription;  CTL contact lenses; OD right eye; OS left eye; OU both eyes  XT exotropia; ET esotropia; PEK punctate epithelial keratitis; PEE punctate epithelial erosions; DES dry eye syndrome; MGD meibomian gland dysfunction; ATs artificial tears; PFAT's preservative free artificial tears; [place] nuclear sclerotic cataract; PSC posterior subcapsular cataract; ERM epi-retinal membrane; PVD posterior vitreous detachment; RD retinal detachment; DM diabetes  mellitus; DR diabetic retinopathy; NPDR non-proliferative diabetic retinopathy; PDR proliferative diabetic retinopathy; CSME clinically significant macular edema; DME diabetic macular edema; dbh dot blot hemorrhages; CWS cotton wool spot; POAG primary open angle glaucoma; C/D cup-to-disc ratio; HVF humphrey visual field; GVF goldmann visual field; OCT optical coherence tomography; IOP intraocular pressure; BRVO Branch retinal vein occlusion; CRVO central retinal vein occlusion; CRAO central retinal artery occlusion; BRAO branch retinal artery occlusion; RT retinal tear; SB scleral buckle; PPV pars plana vitrectomy; VH Vitreous  hemorrhage; PRP panretinal laser photocoagulation; IVK intravitreal kenalog; VMT vitreomacular traction; MH Macular hole;  NVD neovascularization of the disc; NVE neovascularization elsewhere; AREDS age related eye disease study; ARMD age related macular degeneration; POAG primary open angle glaucoma; EBMD epithelial/anterior basement membrane dystrophy; ACIOL anterior chamber intraocular lens; IOL intraocular lens; PCIOL posterior chamber intraocular lens; Phaco/IOL phacoemulsification with intraocular lens placement; [place] photorefractive keratectomy; LASIK laser assisted in situ keratomileusis; HTN hypertension; DM diabetes mellitus; COPD chronic obstructive pulmonary disease

## 2019-11-15 ENCOUNTER — Ambulatory Visit (INDEPENDENT_AMBULATORY_CARE_PROVIDER_SITE_OTHER): Payer: Medicare HMO | Admitting: Ophthalmology

## 2019-11-15 ENCOUNTER — Other Ambulatory Visit: Payer: Self-pay

## 2019-11-15 ENCOUNTER — Encounter (INDEPENDENT_AMBULATORY_CARE_PROVIDER_SITE_OTHER): Payer: Self-pay | Admitting: Ophthalmology

## 2019-11-15 DIAGNOSIS — H25813 Combined forms of age-related cataract, bilateral: Secondary | ICD-10-CM

## 2019-11-15 DIAGNOSIS — H353211 Exudative age-related macular degeneration, right eye, with active choroidal neovascularization: Secondary | ICD-10-CM

## 2019-11-15 DIAGNOSIS — H35711 Central serous chorioretinopathy, right eye: Secondary | ICD-10-CM

## 2019-11-15 DIAGNOSIS — I10 Essential (primary) hypertension: Secondary | ICD-10-CM

## 2019-11-15 DIAGNOSIS — H43811 Vitreous degeneration, right eye: Secondary | ICD-10-CM | POA: Diagnosis not present

## 2019-11-15 DIAGNOSIS — H35033 Hypertensive retinopathy, bilateral: Secondary | ICD-10-CM

## 2019-11-15 DIAGNOSIS — H3581 Retinal edema: Secondary | ICD-10-CM | POA: Diagnosis not present

## 2019-11-15 DIAGNOSIS — H35412 Lattice degeneration of retina, left eye: Secondary | ICD-10-CM

## 2019-11-15 MED ORDER — AFLIBERCEPT 2MG/0.05ML IZ SOLN FOR KALEIDOSCOPE
2.0000 mg | INTRAVITREAL | Status: AC | PRN
  Administered 2019-11-15: 2 mg via INTRAVITREAL

## 2019-12-17 ENCOUNTER — Ambulatory Visit (INDEPENDENT_AMBULATORY_CARE_PROVIDER_SITE_OTHER): Payer: Medicare HMO | Admitting: Ophthalmology

## 2019-12-17 ENCOUNTER — Other Ambulatory Visit: Payer: Self-pay

## 2019-12-17 ENCOUNTER — Encounter (INDEPENDENT_AMBULATORY_CARE_PROVIDER_SITE_OTHER): Payer: Self-pay | Admitting: Ophthalmology

## 2019-12-17 DIAGNOSIS — H35711 Central serous chorioretinopathy, right eye: Secondary | ICD-10-CM

## 2019-12-17 DIAGNOSIS — H353211 Exudative age-related macular degeneration, right eye, with active choroidal neovascularization: Secondary | ICD-10-CM

## 2019-12-17 DIAGNOSIS — H35033 Hypertensive retinopathy, bilateral: Secondary | ICD-10-CM | POA: Diagnosis not present

## 2019-12-17 DIAGNOSIS — H25813 Combined forms of age-related cataract, bilateral: Secondary | ICD-10-CM

## 2019-12-17 DIAGNOSIS — H3581 Retinal edema: Secondary | ICD-10-CM

## 2019-12-17 DIAGNOSIS — I10 Essential (primary) hypertension: Secondary | ICD-10-CM

## 2019-12-17 DIAGNOSIS — H43811 Vitreous degeneration, right eye: Secondary | ICD-10-CM

## 2019-12-17 DIAGNOSIS — H35412 Lattice degeneration of retina, left eye: Secondary | ICD-10-CM | POA: Diagnosis not present

## 2019-12-17 MED ORDER — AFLIBERCEPT 2MG/0.05ML IZ SOLN FOR KALEIDOSCOPE
2.0000 mg | INTRAVITREAL | Status: AC | PRN
  Administered 2019-12-17: 2 mg via INTRAVITREAL

## 2019-12-17 NOTE — Progress Notes (Signed)
[facility] Note  12/17/2019      CHIEF COMPLAINT Patient presents for Retina Follow Up   HISTORY OF PRESENT ILLNESS: Darlene Crawford is a 79 y.o. female who presents to the clinic today for:   HPI    Retina Follow Up    Patient presents with  Wet AMD.  In right eye.  This started 4 weeks ago.  Severity is moderate.  I, the attending physician,  performed the HPI with the patient and updated documentation appropriately.          Comments    Patient here for 4 weeks retina follow up for exu ARMD OD. Patient states vision doing pretty good. No eye pain. Has floaters that come and go. Started flonase and zyrtec because of spring. Stopped melatonin.       Last edited by Bernarda Caffey, MD on 12/17/2019 10:38 PM. (History)    pt states she cannot tell any difference in vision today   Referring physician: Monico Blitz, MD [place],  [place] [id number]  HISTORICAL INFORMATION:   Selected notes from the MEDICAL RECORD NUMBER Referred by Dr. Leander Rams for concern of ARMD OD with progression to CNVM vs CSR    CURRENT MEDICATIONS: Current Outpatient Medications (Ophthalmic Drugs)  Medication Sig  . ARTIFICIAL TEAR SOLUTION OP Place 1 drop into both eyes daily as needed (dry eyes).   Current Facility-Administered Medications (Ophthalmic Drugs)  Medication Route  . aflibercept (EYLEA) SOLN 2 mg Intravitreal  . aflibercept (EYLEA) SOLN 2 mg Intravitreal  . aflibercept (EYLEA) SOLN 2 mg Intravitreal   Current Outpatient Medications (Other)  Medication Sig  . acidophilus (RISAQUAD) CAPS capsule Take 1 capsule by mouth daily.  Marland Kitchen atorvastatin (LIPITOR) 10 MG tablet Take 10 mg by mouth at bedtime.   . Cholecalciferol (VITAMIN D) 50 MCG (2000 UT) tablet Take 4,000 Units by mouth at bedtime.   . Cyanocobalamin (B-12 PO) Take 1 Dose by mouth 2 (two) times a week.  . fluticasone (FLONASE) 50 MCG/ACT nasal spray Place 1 spray into both nostrils daily as  needed for allergies or rhinitis.  . hydrochlorothiazide (HYDRODIURIL) 25 MG tablet Take 25 mg by mouth daily. 1/2 tablet am, 1/2 tablet pm  . levothyroxine (SYNTHROID, LEVOTHROID) 100 MCG tablet Take 100 mcg by mouth daily before breakfast.  . loratadine (CLARITIN) 10 MG tablet Take 10 mg by mouth daily as needed for allergies.   Marland Kitchen losartan (COZAAR) 100 MG tablet Take 100 mg by mouth daily.   . Melatonin 10 MG CAPS Take 10 mg by mouth at bedtime.  . Multiple Vitamins-Minerals (PRESERVISION AREDS 2 PO) Take 1 tablet by mouth at bedtime.   . sertraline (ZOLOFT) 50 MG tablet Take 50 mg by mouth daily.  . sodium chloride (OCEAN) 0.65 % SOLN nasal spray Place 1 spray into both nostrils as needed for congestion.   Current Facility-Administered Medications (Other)  Medication Route  . Bevacizumab (AVASTIN) SOLN 1.25 mg Intravitreal  . Bevacizumab (AVASTIN) SOLN 1.25 mg Intravitreal  . Bevacizumab (AVASTIN) SOLN 1.25 mg Intravitreal      REVIEW OF SYSTEMS: ROS    Positive for: Genitourinary, Musculoskeletal, Eyes   Negative for: Constitutional, Gastrointestinal, Neurological, Skin, HENT, Endocrine, Cardiovascular, Respiratory, Psychiatric, Allergic/Imm, Heme/Lymph   Last edited by Theodore Demark, COA on 12/17/2019  1:50 PM. (History)       ALLERGIES Allergies  Allergen Reactions  . Trilyte [Peg 3350-Kcl-Na Bicarb-Nacl]     HEMATEMESIS  .  Vicodin [Hydrocodone-Acetaminophen] Nausea And Vomiting    PAST MEDICAL HISTORY Past Medical History:  Diagnosis Date  . Anxiety and depression   . Cataract    Combined forms OU  . Hypercholesterolemia   . Hypertension   . Hypertensive retinopathy    OU  . Hypothyroidism   . Macular degeneration    OD  . Osteoarthritis   . Seasonal allergies    Past Surgical History:  Procedure Laterality Date  . BIOPSY  10/01/2018   Procedure: BIOPSY;  Surgeon: Danie Binder, MD;  [facility]: [facility];  Service: Endoscopy;;  gastric bx & gastric  polyp  . COLONOSCOPY N/A 10/01/2018   Procedure: COLONOSCOPY;  Surgeon: Danie Binder, MD;  Location: [facility];  Service: Endoscopy;  Laterality: N/A;  10:30am  . ESOPHAGOGASTRODUODENOSCOPY N/A 10/01/2018   Procedure: ESOPHAGOGASTRODUODENOSCOPY (EGD);  Surgeon: Danie Binder, MD;  Location: [facility];  Service: Endoscopy;  Laterality: N/A;  . POLYPECTOMY  10/01/2018   Procedure: POLYPECTOMY;  Surgeon: Danie Binder, MD;  Location: [facility];  Service: Endoscopy;;  colon   . TUBAL LIGATION    . UTERINE FIBROID SURGERY      FAMILY HISTORY Family History  Problem Relation Age of Onset  . Cancer Mother        lung  . Stroke Father   . Stroke Brother   . Colon cancer Neg Hx     SOCIAL HISTORY Social History   Tobacco Use  . Smoking status: Former Smoker    Types: Cigarettes  . Smokeless tobacco: Never Used  Substance Use Topics  . Alcohol use: No  . Drug use: No         OPHTHALMIC EXAM:  Base Eye Exam    Visual Acuity (Snellen - Linear)      Right Left   Dist cc 20/30 +1 20/25 -2   Dist ph cc NI 20/20 -1   Correction: Glasses       Tonometry (Tonopen, 1:46 PM)      Right Left   Pressure 11 11       Pupils      Dark Light Shape React APD   Right 3 2 Round Brisk None   Left 3 2 Round Brisk None       Visual Fields (Counting fingers)      Left Right    Full Full       Extraocular Movement      Right Left    Full, Ortho Full, Ortho       Neuro/Psych    Oriented x3: Yes   Mood/Affect: Normal       Dilation    Both eyes: 1.0% Mydriacyl, 2.5% Phenylephrine @ 1:46 PM        Slit Lamp and Fundus Exam    Slit Lamp Exam      Right Left   Lids/Lashes Dermatochalasis - upper lid Dermatochalasis - upper lid   Conjunctiva/Sclera White and quiet White and quiet   Cornea Arcus, early nasal band K, 1+PEE Arcus, early nasal band K, 1+PEE   Anterior Chamber moderate depth, with narrow angle  moderate depth, with narrow angle temporally   Iris  Round and well dilated Round and well dilated   Lens 2-3+ Nuclear sclerosis, 2-3+ Cortical cataract 2+ Nuclear sclerosis, 2+ Cortical cataract   Vitreous Vitreous syneresis, Posterior vitreous detachment, Weiss ring Vitreous syneresis       Fundus Exam      Right Left  Disc Pink and Sharp Pink and Sharp, Tilted disc   C/D Ratio 0.3 0.4   Macula Good foveal reflex; interval recurrence in SRF IN macula; drusen; +RPE mottling and clumping Flat, good foveal reflex, Retinal pigment epithelial mottling, superior para-foveal focal area of RPE atrophy -- stable, rare drusen, No heme or edema   Vessels Vascular attenuation, Tortuous Vascular attenuation, Tortuous   Periphery Attached, peripheral drusen / RPE changes nasally, peripheral cystoid degeneration Attached, mild patch of Lattice degeneration at 0600, scattered peripheral drusen, peripheral cystoid degeneration        Refraction    Wearing Rx      Sphere Cylinder Axis Add   Right +2.00 +1.25 158 +2.50   Left +2.00 +0.75 002 +2.50   Type: PAL          IMAGING AND PROCEDURES  Imaging and Procedures for 12/11/17  OCT, Retina - OU - Both Eyes       Right Eye Quality was good. Central Foveal Thickness: 251. Progression has worsened. Findings include no IRF, retinal drusen , pigment epithelial detachment, subretinal hyper-reflective material, normal foveal contour, outer retinal atrophy, no SRF (Mild Interval increase SRF -- still mild).   Left Eye Quality was good. Central Foveal Thickness: 274. Progression has been stable. Findings include normal foveal contour, no IRF, no SRF, retinal drusen .   Notes *Images captured and stored on drive  Diagnosis / Impression:  OD: Mild Interval increase SRF -- still mild OS: NFP, No IRF/SRF  Clinical management:  See below  Abbreviations: NFP - Normal foveal profile. CME - cystoid macular edema. PED - pigment epithelial detachment. IRF - intraretinal fluid. SRF - subretinal fluid. EZ  - ellipsoid zone. ERM - epiretinal membrane. ORA - outer retinal atrophy. ORT - outer retinal tubulation. SRHM - subretinal hyper-reflective material         Intravitreal Injection, Pharmacologic Agent - OD - Right Eye       Time Out 12/17/2019. 1:57 PM. Confirmed correct patient, procedure, site, and patient consented.   Anesthesia Topical anesthesia was used. Anesthetic medications included Lidocaine 2%, Proparacaine 0.5%.   Procedure Preparation included 5% betadine to ocular surface, eyelid speculum. A (32g) needle was used.   Injection:  2 mg aflibercept Alfonse Flavors) SOLN   NDC: [id number], Lot: [id number], Expiration date: 04/24/2020   Route: Intravitreal, Site: Right Eye, Waste: 0.05 mL  Post-op Post injection exam found visual acuity of at least counting fingers. The patient tolerated the procedure well. There were no complications. The patient received written and verbal post procedure care education.                 ASSESSMENT/PLAN:    ICD-10-CM   1. Exudative age-related macular degeneration of right eye with active choroidal neovascularization (HCC)  H35.3211 Intravitreal Injection, Pharmacologic Agent - OD - Right Eye    aflibercept (EYLEA) SOLN 2 mg  2. Central serous chorioretinopathy of right eye  H35.711   3. Retinal edema  H35.81 OCT, Retina - OU - Both Eyes  4. Essential hypertension  I10   5. Hypertensive retinopathy of both eyes  H35.033   6. Lattice degeneration of left retina  H35.412   7. Posterior vitreous detachment of right eye  H43.811   8. Combined forms of age-related cataract of both eyes  H25.813     1-3. Exudative age related macular degeneration vs CSCR OD    - FA (03.02.19) without leakage / pooling into area of SRF  - review of systems  reveals significant stressors in life, but no exogenous steroid use  - discussed treatment with anti-VEGF vs po Eplerenone  - S/P IVA #1 OD (09.27.19),  #2 (10.25.19), #3 (11.22.19) -- minimal  response  - pt approved for [employer] and IVE  - discussed possible addition of po eplerenone (50 mg daily)  - S/P IVE OD #1 (12.20.19), #2 (01.20.20), #3 (02.18.20), #4 (07.20.20) #5 (09.01.20), #6 (10.13.20), #7 (11.20.20), #8 (01.20.21), #9 (02.17.21), #10 (03.19.21)  - lost to f/u from Feb 2020 to July 2020 due to COVID-19 concerns  - today, VA decreased to 20/30 from 20/25 OD   - OCT today with mild interval increase in SRF  - recommend IVE OD #11 today, 04.20.21  - pt wishes to proceed with IVE OD  - RBA of procedure discussed, questions answered  - informed consent obtained   - Eylea informed consent form signed and scanned on 12.22.2020  - see procedure note  - [email or web address] benefits investigation initiated -- approved for 2021  - f/u 5 weeks -- DFE/OCT/ possible injection  4,5. Hypertensive retinopathy OU  - discussed importance of tight BP control  - stable  - monitor  6. Lattice degeneration OS-  - patch of inferior lattice OS -- no RT, holes or SRF  - asymptomatic  - discussed findings, prognosis, and treatment options including observation  - monitor  7. PVD / vitreous syneresis OD-   - Discussed findings and prognosis  - No RT or RD on 360 peripheral exam  - Reviewed s/s of RT/RD  - Strict return precautions for any such RT/RD signs/symptoms  8. Combined form age-related cataract OU-   - The symptoms of cataract, surgical options, and treatments and risks were discussed with patient.  - discussed diagnosis and progression  - not yet visually significant  - monitor for now   Ophthalmic Meds Ordered this visit:  Meds ordered this encounter  Medications  . aflibercept (EYLEA) SOLN 2 mg       Return in about 5 weeks (around 01/21/2020) for f/u exu ARMD OD, DFE, OCT.  There are no Patient Instructions on file for this visit.   Explained the diagnoses, plan, and follow up with the patient and they expressed understanding.  Patient expressed understanding of the  importance of proper follow up care.   This document serves as a record of services personally performed by Gardiner Sleeper, MD, PhD. It was created on their behalf by Estill Bakes, COT an ophthalmic technician. The creation of this record is the provider's dictation and/or activities during the visit.    Electronically signed by: Estill Bakes, COT 12/17/19 @ 10:42 PM   This document serves as a record of services personally performed by Gardiner Sleeper, MD, PhD. It was created on their behalf by Ernest Mallick, OA, an ophthalmic assistant. The creation of this record is the provider's dictation and/or activities during the visit.    Electronically signed by: Ernest Mallick, OA 04.20.2021 10:42 PM  Gardiner Sleeper, M.D., Ph.D. Diseases & Surgery of the Retina and [employer] 12/17/2019   I have reviewed the above documentation for accuracy and completeness, and I agree with the above. Gardiner Sleeper, M.D., Ph.D. 12/17/19 10:42 PM   Abbreviations: M myopia (nearsighted); A astigmatism; H hyperopia (farsighted); P presbyopia; Mrx spectacle prescription;  CTL contact lenses; OD right eye; OS left eye; OU both eyes  XT exotropia; ET esotropia; PEK punctate epithelial keratitis; PEE punctate epithelial erosions;  DES dry eye syndrome; MGD meibomian gland dysfunction; ATs artificial tears; PFAT's preservative free artificial tears; Newman nuclear sclerotic cataract; PSC posterior subcapsular cataract; ERM epi-retinal membrane; PVD posterior vitreous detachment; RD retinal detachment; DM diabetes mellitus; DR diabetic retinopathy; NPDR non-proliferative diabetic retinopathy; PDR proliferative diabetic retinopathy; CSME clinically significant macular edema; DME diabetic macular edema; dbh dot blot hemorrhages; CWS cotton wool spot; POAG primary open angle glaucoma; C/D cup-to-disc ratio; HVF humphrey visual field; GVF goldmann visual field; OCT optical coherence tomography; IOP  intraocular pressure; BRVO Branch retinal vein occlusion; CRVO central retinal vein occlusion; CRAO central retinal artery occlusion; BRAO branch retinal artery occlusion; RT retinal tear; SB scleral buckle; PPV pars plana vitrectomy; VH Vitreous hemorrhage; PRP panretinal laser photocoagulation; IVK intravitreal kenalog; VMT vitreomacular traction; MH Macular hole;  NVD neovascularization of the disc; NVE neovascularization elsewhere; AREDS age related eye disease study; ARMD age related macular degeneration; POAG primary open angle glaucoma; EBMD epithelial/anterior basement membrane dystrophy; ACIOL anterior chamber intraocular lens; IOL intraocular lens; PCIOL posterior chamber intraocular lens; Phaco/IOL phacoemulsification with intraocular lens placement; [place] photorefractive keratectomy; LASIK laser assisted in situ keratomileusis; HTN hypertension; DM diabetes mellitus; COPD chronic obstructive pulmonary disease

## 2019-12-24 DIAGNOSIS — I10 Essential (primary) hypertension: Secondary | ICD-10-CM | POA: Diagnosis not present

## 2019-12-24 DIAGNOSIS — M5431 Sciatica, right side: Secondary | ICD-10-CM | POA: Diagnosis not present

## 2019-12-24 DIAGNOSIS — Z299 Encounter for prophylactic measures, unspecified: Secondary | ICD-10-CM | POA: Diagnosis not present

## 2019-12-24 DIAGNOSIS — E039 Hypothyroidism, unspecified: Secondary | ICD-10-CM | POA: Diagnosis not present

## 2020-01-20 NOTE — Progress Notes (Signed)
[facility] Note  01/21/2020      CHIEF COMPLAINT Patient presents for Retina Follow Up   HISTORY OF PRESENT ILLNESS: Darlene Crawford is a 79 y.o. female who presents to the clinic today for:   HPI    Retina Follow Up    Patient presents with  Wet AMD.  In right eye.  Severity is moderate.  Duration of 5 weeks.  Since onset it is rapidly worsening.  I, the attending physician,  performed the HPI with the patient and updated documentation appropriately.          Comments    5 week NVAMD OD follow up-  She can tell "the fluid is back" OD.  If she closes OS, she can see the area in vision.  Seemed like it was better last visit and coming back this visit.         Last edited by Bernarda Caffey, MD on 01/21/2020 10:56 PM. (History)    pt states her vision started getting worse last week, she states after her last injection her vision got very bright white and jagged like broken glass, she states it last for about a minute and then resolved and has not come back since then   Referring physician: Monico Blitz, MD [place],  [place] [id number]  HISTORICAL INFORMATION:   Selected notes from the [facility] Referred by Dr. Leander Rams for concern of ARMD OD with progression to CNVM vs CSR    CURRENT MEDICATIONS: Current Outpatient Medications (Ophthalmic Drugs)  Medication Sig  . ARTIFICIAL TEAR SOLUTION OP Place 1 drop into both eyes daily as needed (dry eyes).   Current Facility-Administered Medications (Ophthalmic Drugs)  Medication Route  . aflibercept (EYLEA) SOLN 2 mg Intravitreal  . aflibercept (EYLEA) SOLN 2 mg Intravitreal  . aflibercept (EYLEA) SOLN 2 mg Intravitreal   Current Outpatient Medications (Other)  Medication Sig  . acidophilus (RISAQUAD) CAPS capsule Take 1 capsule by mouth daily.  Marland Kitchen atorvastatin (LIPITOR) 10 MG tablet Take 10 mg by mouth at bedtime.   . Cholecalciferol (VITAMIN D) 50 MCG (2000 UT) tablet Take 4,000  Units by mouth at bedtime.   . Cyanocobalamin (B-12 PO) Take 1 Dose by mouth 2 (two) times a week.  . fluticasone (FLONASE) 50 MCG/ACT nasal spray Place 1 spray into both nostrils daily as needed for allergies or rhinitis.  . hydrochlorothiazide (HYDRODIURIL) 25 MG tablet Take 25 mg by mouth daily. 1/2 tablet am, 1/2 tablet pm  . levothyroxine (SYNTHROID, LEVOTHROID) 100 MCG tablet Take 100 mcg by mouth daily before breakfast.  . loratadine (CLARITIN) 10 MG tablet Take 10 mg by mouth daily as needed for allergies.   Marland Kitchen losartan (COZAAR) 100 MG tablet Take 100 mg by mouth daily.   . Melatonin 10 MG CAPS Take 10 mg by mouth at bedtime.  . Multiple Vitamins-Minerals (PRESERVISION AREDS 2 PO) Take 1 tablet by mouth at bedtime.   . sertraline (ZOLOFT) 50 MG tablet Take 50 mg by mouth daily.  . sodium chloride (OCEAN) 0.65 % SOLN nasal spray Place 1 spray into both nostrils as needed for congestion.   Current Facility-Administered Medications (Other)  Medication Route  . Bevacizumab (AVASTIN) SOLN 1.25 mg Intravitreal  . Bevacizumab (AVASTIN) SOLN 1.25 mg Intravitreal  . Bevacizumab (AVASTIN) SOLN 1.25 mg Intravitreal      REVIEW OF SYSTEMS: ROS    Positive for: Genitourinary, Musculoskeletal, Endocrine, Eyes, Psychiatric, Allergic/Imm   Negative  for: Constitutional, Gastrointestinal, Neurological, Skin, HENT, Cardiovascular, Respiratory, Heme/Lymph   Last edited by Leonie Douglas, COA on 01/21/2020  1:10 PM. (History)       ALLERGIES Allergies  Allergen Reactions  . Trilyte [Peg 3350-Kcl-Na Bicarb-Nacl]     HEMATEMESIS  . Vicodin [Hydrocodone-Acetaminophen] Nausea And Vomiting    PAST MEDICAL HISTORY Past Medical History:  Diagnosis Date  . Anxiety and depression   . Cataract    Combined forms OU  . Hypercholesterolemia   . Hypertension   . Hypertensive retinopathy    OU  . Hypothyroidism   . Macular degeneration    OD  . Osteoarthritis   . Seasonal allergies    Past  Surgical History:  Procedure Laterality Date  . BIOPSY  10/01/2018   Procedure: BIOPSY;  Surgeon: Danie Binder, MD;  [facility];  Service: Endoscopy;;  gastric bx & gastric polyp  . COLONOSCOPY N/A 10/01/2018   Procedure: COLONOSCOPY;  Surgeon: Danie Binder, MD;  Location: [facility];  Service: Endoscopy;  Laterality: N/A;  10:30am  . ESOPHAGOGASTRODUODENOSCOPY N/A 10/01/2018   Procedure: ESOPHAGOGASTRODUODENOSCOPY (EGD);  Surgeon: Danie Binder, MD;  Location: [facility];  Service: Endoscopy;  Laterality: N/A;  . POLYPECTOMY  10/01/2018   Procedure: POLYPECTOMY;  Surgeon: Danie Binder, MD;  Location: [facility];  Service: Endoscopy;;  colon   . TUBAL LIGATION    . UTERINE FIBROID SURGERY      FAMILY HISTORY Family History  Problem Relation Age of Onset  . Cancer Mother        lung  . Stroke Father   . Stroke Brother   . Colon cancer Neg Hx     SOCIAL HISTORY Social History   Tobacco Use  . Smoking status: Former Smoker    Types: Cigarettes  . Smokeless tobacco: Never Used  Substance Use Topics  . Alcohol use: No  . Drug use: No         OPHTHALMIC EXAM:  Base Eye Exam    Visual Acuity (Snellen - Linear)      Right Left   Dist [place] 20/40 -2 20/25 +1   Dist ph [place] 20/40    Correction: Glasses       Tonometry (Tonopen, 1:15 PM)      Right Left   Pressure 13 15       Pupils      Dark Light Shape React APD   Right 3 2 Round Brisk None   Left 3 2 Round Brisk None       Visual Fields (Counting fingers)      Left Right    Full Full       Extraocular Movement      Right Left    Full Full       Neuro/Psych    Oriented x3: Yes   Mood/Affect: Normal       Dilation    Right eye: 1.0% Mydriacyl, 2.5% Phenylephrine @ 1:16 PM  Dilate OD only per pts request today.        Slit Lamp and Fundus Exam    Slit Lamp Exam      Right Left   Lids/Lashes Dermatochalasis - upper lid Dermatochalasis - upper lid   Conjunctiva/Sclera White  and quiet White and quiet   Cornea Arcus, early nasal band K, trace PEE Arcus, early nasal band K, 1+PEE   Anterior Chamber moderate depth, with narrow angle  moderate depth, with narrow angle temporally  Iris Round and well dilated Round and well dilated   Lens 2-3+ Nuclear sclerosis, 2-3+ Cortical cataract 2+ Nuclear sclerosis, 2+ Cortical cataract   Vitreous Vitreous syneresis, Posterior vitreous detachment, Weiss ring Vitreous syneresis       Fundus Exam      Right Left   Disc Pink and Sharp Pink and Sharp, Tilted disc   C/D Ratio 0.3 0.4   Macula blunted foveal reflex; central SRF - increased from prior; drusen; +RPE mottling and clumping Flat, good foveal reflex, Retinal pigment epithelial mottling, superior para-foveal focal area of RPE atrophy -- stable, rare drusen, No heme or edema   Vessels Vascular attenuation, Tortuous Vascular attenuation, Tortuous   Periphery Attached, peripheral drusen / RPE changes nasally, peripheral cystoid degeneration Attached, mild patch of Lattice degeneration at 0600, scattered peripheral drusen, peripheral cystoid degeneration        Refraction    Wearing Rx      Sphere Cylinder Axis Add   Right +2.00 +1.25 158 +2.50   Left +2.00 +0.75 002 +2.50          IMAGING AND PROCEDURES  Imaging and Procedures for 12/11/17  OCT, Retina - OU - Both Eyes       Right Eye Quality was good. Central Foveal Thickness: 443. Progression has worsened. Findings include no IRF, retinal drusen , pigment epithelial detachment, subretinal hyper-reflective material, normal foveal contour, outer retinal atrophy, no SRF (Mild Interval worsening of SRF ).   Left Eye Quality was good. Central Foveal Thickness: 277. Progression has been stable. Findings include normal foveal contour, no IRF, no SRF, retinal drusen .   Notes *Images captured and stored on drive  Diagnosis / Impression:  OD: Mild Interval worsening of SRF  OS: NFP, No IRF/SRF  Clinical  management:  See below  Abbreviations: NFP - Normal foveal profile. CME - cystoid macular edema. PED - pigment epithelial detachment. IRF - intraretinal fluid. SRF - subretinal fluid. EZ - ellipsoid zone. ERM - epiretinal membrane. ORA - outer retinal atrophy. ORT - outer retinal tubulation. SRHM - subretinal hyper-reflective material         Intravitreal Injection, Pharmacologic Agent - OD - Right Eye       Time Out 01/21/2020. 1:20 PM. Confirmed correct patient, procedure, site, and patient consented.   Anesthesia Topical anesthesia was used. Anesthetic medications included Lidocaine 2%, Proparacaine 0.5%.   Procedure Preparation included 5% betadine to ocular surface, eyelid speculum. A (33g) needle was used.   Injection:  2 mg aflibercept Alfonse Flavors) SOLN   NDC: [id number], Lot: [id number], Expiration date: 05/25/2020   Route: Intravitreal, Site: Right Eye, Waste: 0.05 mL  Post-op Post injection exam found visual acuity of at least counting fingers. The patient tolerated the procedure well. There were no complications. The patient received written and verbal post procedure care education.                 ASSESSMENT/PLAN:    ICD-10-CM   1. Exudative age-related macular degeneration of right eye with active choroidal neovascularization (HCC)  H35.3211 Intravitreal Injection, Pharmacologic Agent - OD - Right Eye    aflibercept (EYLEA) SOLN 2 mg  2. Central serous chorioretinopathy of right eye  H35.711   3. Retinal edema  H35.81 OCT, Retina - OU - Both Eyes  4. Essential hypertension  I10   5. Hypertensive retinopathy of both eyes  H35.033   6. Lattice degeneration of left retina  H35.412   7. Posterior vitreous detachment of right eye  H43.811   8. Combined forms of age-related cataract of both eyes  H25.813     1-3. Exudative age related macular degeneration vs CSCR OD    - FA (03.02.19) without leakage / pooling into area of SRF  - review of systems reveals  significant stressors in life, but no exogenous steroid use  - discussed treatment with anti-VEGF vs po Eplerenone  - S/P IVA #1 OD (09.27.19),  #2 (10.25.19), #3 (11.22.19) -- minimal response  - pt approved for [employer] and IVE  - discussed possible addition of po eplerenone (50 mg daily)  - S/P IVE OD #1 (12.20.19), #2 (01.20.20), #3 (02.18.20), #4 (07.20.20) #5 (09.01.20), #6 (10.13.20), #7 (11.20.20), #8 (01.20.21), #9 (02.17.21), #10 (03.19.21), #11 (04.20.21)  - lost to f/u from Feb 2020 to July 2020 due to COVID-19 concerns  - today, VA decreased to 20/40 from 20/30 OD   - OCT today with mild interval increase in SRF (5 wk interval rather than 4)  - recommend IVE OD #12 today, 05.25.21  - pt wishes to proceed with IVE OD  - RBA of procedure discussed, questions answered  - informed consent obtained   - Eylea informed consent form signed and scanned on 12.22.2020  - see procedure note  - [email or web address] benefits investigation initiated -- approved for 2021  - f/u 4 weeks -- DFE/OCT/ possible injection  4,5. Hypertensive retinopathy OU  - discussed importance of tight BP control  - stable  - monitor  6. Lattice degeneration OS-  - patch of inferior lattice OS -- no RT, holes or SRF  - asymptomatic  - discussed findings, prognosis, and treatment options including observation  - monitor  7. PVD / vitreous syneresis OD-   - Discussed findings and prognosis  - No RT or RD on 360 peripheral exam  - Reviewed s/s of RT/RD  - Strict return precautions for any such RT/RD signs/symptoms  8. Combined form age-related cataract OU-   - The symptoms of cataract, surgical options, and treatments and risks were discussed with patient.  - discussed diagnosis and progression  - not yet visually significant  - monitor for now   Ophthalmic Meds Ordered this visit:  Meds ordered this encounter  Medications  . aflibercept (EYLEA) SOLN 2 mg       Return in about 4 weeks (around 02/18/2020)  for f/u exu ARMD OD, DFE, OCT.  There are no Patient Instructions on file for this visit.   Explained the diagnoses, plan, and follow up with the patient and they expressed understanding.  Patient expressed understanding of the importance of proper follow up care.   This document serves as a record of services personally performed by Gardiner Sleeper, MD, PhD. It was created on their behalf by Estill Bakes, COT an ophthalmic technician. The creation of this record is the provider's dictation and/or activities during the visit.    Electronically signed by: Estill Bakes, COT 01/20/20 @ 10:58 PM   This document serves as a record of services personally performed by Gardiner Sleeper, MD, PhD. It was created on their behalf by Ernest Mallick, OA, an ophthalmic assistant. The creation of this record is the provider's dictation and/or activities during the visit.    Electronically signed by: Ernest Mallick, OA 05.25.2021 10:58 PM  Gardiner Sleeper, M.D., Ph.D. Diseases & Surgery of the Retina and [place] 01/21/2020   I have reviewed the above documentation for accuracy and completeness, and I  agree with the above. Gardiner Sleeper, M.D., Ph.D. 01/21/20 10:58 PM   Abbreviations: M myopia (nearsighted); A astigmatism; H hyperopia (farsighted); P presbyopia; Mrx spectacle prescription;  CTL contact lenses; OD right eye; OS left eye; OU both eyes  XT exotropia; ET esotropia; PEK punctate epithelial keratitis; PEE punctate epithelial erosions; DES dry eye syndrome; MGD meibomian gland dysfunction; ATs artificial tears; PFAT's preservative free artificial tears; [place] nuclear sclerotic cataract; PSC posterior subcapsular cataract; ERM epi-retinal membrane; PVD posterior vitreous detachment; RD retinal detachment; DM diabetes mellitus; DR diabetic retinopathy; NPDR non-proliferative diabetic retinopathy; PDR proliferative diabetic retinopathy; CSME clinically significant macular  edema; DME diabetic macular edema; dbh dot blot hemorrhages; CWS cotton wool spot; POAG primary open angle glaucoma; C/D cup-to-disc ratio; HVF humphrey visual field; GVF goldmann visual field; OCT optical coherence tomography; IOP intraocular pressure; BRVO Branch retinal vein occlusion; CRVO central retinal vein occlusion; CRAO central retinal artery occlusion; BRAO branch retinal artery occlusion; RT retinal tear; SB scleral buckle; PPV pars plana vitrectomy; VH Vitreous hemorrhage; PRP panretinal laser photocoagulation; IVK intravitreal kenalog; VMT vitreomacular traction; MH Macular hole;  NVD neovascularization of the disc; NVE neovascularization elsewhere; AREDS age related eye disease study; ARMD age related macular degeneration; POAG primary open angle glaucoma; EBMD epithelial/anterior basement membrane dystrophy; ACIOL anterior chamber intraocular lens; IOL intraocular lens; PCIOL posterior chamber intraocular lens; Phaco/IOL phacoemulsification with intraocular lens placement; [place] photorefractive keratectomy; LASIK laser assisted in situ keratomileusis; HTN hypertension; DM diabetes mellitus; COPD chronic obstructive pulmonary disease

## 2020-01-21 ENCOUNTER — Other Ambulatory Visit: Payer: Self-pay

## 2020-01-21 ENCOUNTER — Ambulatory Visit (INDEPENDENT_AMBULATORY_CARE_PROVIDER_SITE_OTHER): Payer: Medicare HMO | Admitting: Ophthalmology

## 2020-01-21 ENCOUNTER — Encounter (INDEPENDENT_AMBULATORY_CARE_PROVIDER_SITE_OTHER): Payer: Self-pay | Admitting: Ophthalmology

## 2020-01-21 DIAGNOSIS — H35711 Central serous chorioretinopathy, right eye: Secondary | ICD-10-CM

## 2020-01-21 DIAGNOSIS — H43811 Vitreous degeneration, right eye: Secondary | ICD-10-CM | POA: Diagnosis not present

## 2020-01-21 DIAGNOSIS — H35033 Hypertensive retinopathy, bilateral: Secondary | ICD-10-CM | POA: Diagnosis not present

## 2020-01-21 DIAGNOSIS — I10 Essential (primary) hypertension: Secondary | ICD-10-CM | POA: Diagnosis not present

## 2020-01-21 DIAGNOSIS — H25813 Combined forms of age-related cataract, bilateral: Secondary | ICD-10-CM | POA: Diagnosis not present

## 2020-01-21 DIAGNOSIS — H353211 Exudative age-related macular degeneration, right eye, with active choroidal neovascularization: Secondary | ICD-10-CM | POA: Diagnosis not present

## 2020-01-21 DIAGNOSIS — H3581 Retinal edema: Secondary | ICD-10-CM | POA: Diagnosis not present

## 2020-01-21 DIAGNOSIS — H35412 Lattice degeneration of retina, left eye: Secondary | ICD-10-CM | POA: Diagnosis not present

## 2020-01-21 MED ORDER — AFLIBERCEPT 2MG/0.05ML IZ SOLN FOR KALEIDOSCOPE
2.0000 mg | INTRAVITREAL | Status: AC | PRN
  Administered 2020-01-21: 2 mg via INTRAVITREAL

## 2020-02-17 NOTE — Progress Notes (Signed)
[facility] Note  02/19/2020      CHIEF COMPLAINT Patient presents for Retina Follow Up   HISTORY OF PRESENT ILLNESS: Darlene Crawford is a 79 y.o. female who presents to the clinic today for:   HPI    Retina Follow Up    Patient presents with  Wet AMD.  In right eye.  This started months ago.  Severity is moderate.  Duration of 4 weeks.  Since onset it is stable.  I, the attending physician,  performed the HPI with the patient and updated documentation appropriately.          Comments    79 y/o female pt here for 4 wk f/u for exu ARMD OD.  VA OD may be slightly worse.  Denies pain, FOL, floaters.  No gtts.       Last edited by Bernarda Caffey, MD on 02/19/2020 12:20 PM. (History)    VA OD may be stable or slightly worse.  Saw floater OD that looked like "broken glass" briefly after injection last visit.   Referring physician: Monico Blitz, MD [place],  [place] [address]  HISTORICAL INFORMATION:   Selected notes from the MEDICAL RECORD NUMBER Referred by Dr. Leander Rams for concern of ARMD OD with progression to CNVM vs CSR    CURRENT MEDICATIONS: Current Outpatient Medications (Ophthalmic Drugs)  Medication Sig  . ARTIFICIAL TEAR SOLUTION OP Place 1 drop into both eyes daily as needed (dry eyes).   Current Facility-Administered Medications (Ophthalmic Drugs)  Medication Route  . aflibercept (EYLEA) SOLN 2 mg Intravitreal  . aflibercept (EYLEA) SOLN 2 mg Intravitreal  . aflibercept (EYLEA) SOLN 2 mg Intravitreal   Current Outpatient Medications (Other)  Medication Sig  . acidophilus (RISAQUAD) CAPS capsule Take 1 capsule by mouth daily.  Marland Kitchen atorvastatin (LIPITOR) 10 MG tablet Take 10 mg by mouth at bedtime.   . Cholecalciferol (VITAMIN D) 50 MCG (2000 UT) tablet Take 4,000 Units by mouth at bedtime.   . Cyanocobalamin (B-12 PO) Take 1 Dose by mouth 2 (two) times a week.  . fluticasone (FLONASE) 50 MCG/ACT nasal spray Place 1 spray into  both nostrils daily as needed for allergies or rhinitis.  . hydrochlorothiazide (HYDRODIURIL) 25 MG tablet Take 25 mg by mouth daily. 1/2 tablet am, 1/2 tablet pm  . levothyroxine (SYNTHROID, LEVOTHROID) 100 MCG tablet Take 100 mcg by mouth daily before breakfast.  . loratadine (CLARITIN) 10 MG tablet Take 10 mg by mouth daily as needed for allergies.   Marland Kitchen losartan (COZAAR) 100 MG tablet Take 100 mg by mouth daily.   . Melatonin 10 MG CAPS Take 10 mg by mouth at bedtime.  . Multiple Vitamins-Minerals (PRESERVISION AREDS 2 PO) Take 1 tablet by mouth at bedtime.   . sertraline (ZOLOFT) 50 MG tablet Take 50 mg by mouth daily.  . sodium chloride (OCEAN) 0.65 % SOLN nasal spray Place 1 spray into both nostrils as needed for congestion.   Current Facility-Administered Medications (Other)  Medication Route  . Bevacizumab (AVASTIN) SOLN 1.25 mg Intravitreal  . Bevacizumab (AVASTIN) SOLN 1.25 mg Intravitreal  . Bevacizumab (AVASTIN) SOLN 1.25 mg Intravitreal      REVIEW OF SYSTEMS: ROS    Positive for: Musculoskeletal, Eyes   Negative for: Constitutional, Gastrointestinal, Neurological, Skin, Genitourinary, HENT, Endocrine, Cardiovascular, Respiratory, Psychiatric, Allergic/Imm, Heme/Lymph   Last edited by Matthew Folks, COA on 02/19/2020  9:31 AM. (History)       ALLERGIES Allergies  Allergen Reactions  . Trilyte [Peg 3350-Kcl-Na Bicarb-Nacl]     HEMATEMESIS  . Vicodin [Hydrocodone-Acetaminophen] Nausea And Vomiting    PAST MEDICAL HISTORY Past Medical History:  Diagnosis Date  . Anxiety and depression   . Cataract    Combined forms OU  . Hypercholesterolemia   . Hypertension   . Hypertensive retinopathy    OU  . Hypothyroidism   . Macular degeneration    OD  . Osteoarthritis   . Seasonal allergies    Past Surgical History:  Procedure Laterality Date  . BIOPSY  10/01/2018   Procedure: BIOPSY;  Surgeon: Danie Binder, MD;  [facility];  Service: Endoscopy;;   gastric bx & gastric polyp  . COLONOSCOPY N/A 10/01/2018   Procedure: COLONOSCOPY;  Surgeon: Danie Binder, MD;  [facility]: [facility];  Service: Endoscopy;  Laterality: N/A;  10:30am  . ESOPHAGOGASTRODUODENOSCOPY N/A 10/01/2018   Procedure: ESOPHAGOGASTRODUODENOSCOPY (EGD);  Surgeon: Danie Binder, MD;  [facility]: [facility];  Service: Endoscopy;  Laterality: N/A;  . POLYPECTOMY  10/01/2018   Procedure: POLYPECTOMY;  Surgeon: Danie Binder, MD;  [facility];  Service: Endoscopy;;  colon   . TUBAL LIGATION    . UTERINE FIBROID SURGERY      FAMILY HISTORY Family History  Problem Relation Age of Onset  . Cancer Mother        lung  . Stroke Father   . Stroke Brother   . Colon cancer Neg Hx     SOCIAL HISTORY Social History   Tobacco Use  . Smoking status: Former Smoker    Types: Cigarettes  . Smokeless tobacco: Never Used  Vaping Use  . Vaping Use: Never used  Substance Use Topics  . Alcohol use: No  . Drug use: No         OPHTHALMIC EXAM:  Base Eye Exam    Visual Acuity (Snellen - Linear)      Right Left   Dist cc 20/40 - 20/25 +2   Dist ph cc NI NI   Correction: Glasses       Tonometry (Tonopen, 9:33 AM)      Right Left   Pressure 13 14       Pupils      Dark Light Shape React APD   Right 3 2 Round Brisk None   Left 3 2 Round Brisk None       Visual Fields (Counting fingers)      Left Right    Full Full       Extraocular Movement      Right Left    Full, Ortho Full, Ortho       Neuro/Psych    Oriented x3: Yes   Mood/Affect: Normal       Dilation    Right eye: 1.0% Mydriacyl, 2.5% Phenylephrine @ 9:33 AM  Pt defers dil OS        Slit Lamp and Fundus Exam    Slit Lamp Exam      Right Left   Lids/Lashes Dermatochalasis - upper lid Dermatochalasis - upper lid   Conjunctiva/Sclera White and quiet White and quiet   Cornea Arcus, early nasal band K, trace PEE Arcus, early nasal band K, 1+PEE   Anterior Chamber moderate  depth, with narrow angle  moderate depth, with narrow angle temporally   Iris Round and well dilated Round and well dilated   Lens 2-3+ Nuclear sclerosis, 2-3+ Cortical cataract 2+ Nuclear sclerosis, 2+  Cortical cataract   Vitreous Vitreous syneresis, Posterior vitreous detachment, Weiss ring Vitreous syneresis       Fundus Exam      Right Left   Disc Pink and Sharp Pink and Sharp, Tilted disc   C/D Ratio 0.3 0.4   Macula blunted foveal reflex; central SRF - increased from prior; drusen; +RPE mottling and clumping Flat, good foveal reflex, Retinal pigment epithelial mottling, superior para-foveal focal area of RPE atrophy -- stable, rare drusen, No heme or edema   Vessels Vascular attenuation, Tortuous Vascular attenuation, Tortuous   Periphery Attached, peripheral drusen / RPE changes nasally, peripheral cystoid degeneration Attached, mild patch of Lattice degeneration at 0600, scattered peripheral drusen, peripheral cystoid degeneration          IMAGING AND PROCEDURES  Imaging and Procedures for 12/11/17  OCT, Retina - OU - Both Eyes       Right Eye Quality was good. Central Foveal Thickness: 442. Progression has worsened. Findings include no IRF, retinal drusen , pigment epithelial detachment, subretinal hyper-reflective material, normal foveal contour, outer retinal atrophy, no SRF (Interval increase in SRF ).   Left Eye Quality was good. Central Foveal Thickness: 278. Progression has been stable. Findings include normal foveal contour, no IRF, no SRF, retinal drusen .   Notes *Images captured and stored on drive  Diagnosis / Impression:  OD: Interval increase in SRF  OS: NFP, No IRF/SRF  Clinical management:  See below  Abbreviations: NFP - Normal foveal profile. CME - cystoid macular edema. PED - pigment epithelial detachment. IRF - intraretinal fluid. SRF - subretinal fluid. EZ - ellipsoid zone. ERM - epiretinal membrane. ORA - outer retinal atrophy. ORT - outer  retinal tubulation. SRHM - subretinal hyper-reflective material         Intravitreal Injection, Pharmacologic Agent - OD - Right Eye       Time Out 02/19/2020. 10:52 AM. Confirmed correct patient, procedure, site, and patient consented.   Anesthesia Topical anesthesia was used. Anesthetic medications included Lidocaine 2%, Proparacaine 0.5%.   Procedure Preparation included 5% betadine to ocular surface, eyelid speculum. A (32g) needle was used.   Injection:  2 mg aflibercept Alfonse Flavors) SOLN   NDC: A3590391, Lot: [id number], Expiration date: 06/24/2020   Route: Intravitreal, Site: Right Eye, Waste: 0.05 mL  Post-op Post injection exam found visual acuity of at least counting fingers. The patient tolerated the procedure well. There were no complications. The patient received written and verbal post procedure care education.                 ASSESSMENT/PLAN:    ICD-10-CM   1. Exudative age-related macular degeneration of right eye with active choroidal neovascularization (HCC)  H35.3211 Intravitreal Injection, Pharmacologic Agent - OD - Right Eye    aflibercept (EYLEA) SOLN 2 mg  2. Central serous chorioretinopathy of right eye  H35.711   3. Retinal edema  H35.81 OCT, Retina - OU - Both Eyes  4. Essential hypertension  I10   5. Hypertensive retinopathy of both eyes  H35.033   6. Lattice degeneration of left retina  H35.412   7. Posterior vitreous detachment of right eye  H43.811   8. Combined forms of age-related cataract of both eyes  H25.813     1-3. Exudative age related macular degeneration vs CSCR OD    - FA (03.02.19) without leakage / pooling into area of SRF  - review of systems reveals significant stressors in life, but no exogenous steroid use  - discussed  treatment with anti-VEGF vs po Eplerenone  - S/P IVA #1 OD (09.27.19),  #2 (10.25.19), #3 (11.22.19) -- minimal response  - pt approved for [employer] and IVE  - discussed possible addition of po  eplerenone (50 mg daily)  - S/P IVE OD #1 (12.20.19), #2 (01.20.20), #3 (02.18.20), #4 (07.20.20) #5 (09.01.20), #6 (10.13.20), #7 (11.20.20), #8 (01.20.21), #9 (02.17.21), #10 (03.19.21), #11 (04.20.21), #12 (05.25.21)  - lost to f/u from Feb 2020 to July 2020 due to COVID-19 concerns  - today, VA stable at 20/40-   - OCT today with continued mild interval increase in SRF--?IVE resistance  - recommend IVE OD #13 today, 06.23.21  - pt wishes to proceed with IVE OD  - RBA of procedure discussed, questions answered  - informed consent obtained   - Eylea informed consent form signed and scanned on 12.22.2020  - see procedure note  - [email or web address] benefits investigation initiated -- approved for 2021  - f/u 4 weeks -- DFE/OCT/ possible injection; consider changing medication or addition of eplerenone  4,5. Hypertensive retinopathy OU  - discussed importance of tight BP control  - stable  - monitor  6. Lattice degeneration OS-  - patch of inferior lattice OS -- no RT, holes or SRF  - asymptomatic  - discussed findings, prognosis, and treatment options including observation  - monitor  7. PVD / vitreous syneresis OD-   - Discussed findings and prognosis  - No RT or RD on 360 peripheral exam  - Reviewed s/s of RT/RD  - Strict return precautions for any such RT/RD signs/symptoms  8. Combined form age-related cataract OU-   - The symptoms of cataract, surgical options, and treatments and risks were discussed with patient.  - discussed diagnosis and progression  - not yet visually significant  - monitor for now   Ophthalmic Meds Ordered this visit:  Meds ordered this encounter  Medications  . aflibercept (EYLEA) SOLN 2 mg       Return in about 4 weeks (around 03/18/2020) for 4 wk f/u for exu ARMD OD w/DFE/OCT/poss. inj,.  There are no Patient Instructions on file for this visit.   Explained the diagnoses, plan, and follow up with the patient and they expressed understanding.   Patient expressed understanding of the importance of proper follow up care.   Gardiner Sleeper, M.D., Ph.D. Diseases & Surgery of the Retina and [place] 02/19/2020   I have reviewed the above documentation for accuracy and completeness, and I agree with the above. Gardiner Sleeper, M.D., Ph.D. 02/19/20 12:22 PM    Abbreviations: M myopia (nearsighted); A astigmatism; H hyperopia (farsighted); P presbyopia; Mrx spectacle prescription;  CTL contact lenses; OD right eye; OS left eye; OU both eyes  XT exotropia; ET esotropia; PEK punctate epithelial keratitis; PEE punctate epithelial erosions; DES dry eye syndrome; MGD meibomian gland dysfunction; ATs artificial tears; PFAT's preservative free artificial tears; [place] nuclear sclerotic cataract; PSC posterior subcapsular cataract; ERM epi-retinal membrane; PVD posterior vitreous detachment; RD retinal detachment; DM diabetes mellitus; DR diabetic retinopathy; NPDR non-proliferative diabetic retinopathy; PDR proliferative diabetic retinopathy; CSME clinically significant macular edema; DME diabetic macular edema; dbh dot blot hemorrhages; CWS cotton wool spot; POAG primary open angle glaucoma; C/D cup-to-disc ratio; HVF humphrey visual field; GVF goldmann visual field; OCT optical coherence tomography; IOP intraocular pressure; BRVO Branch retinal vein occlusion; CRVO central retinal vein occlusion; CRAO central retinal artery occlusion; BRAO branch retinal artery occlusion; RT retinal tear; SB scleral  buckle; PPV pars plana vitrectomy; VH Vitreous hemorrhage; PRP panretinal laser photocoagulation; IVK intravitreal kenalog; VMT vitreomacular traction; MH Macular hole;  NVD neovascularization of the disc; NVE neovascularization elsewhere; AREDS age related eye disease study; ARMD age related macular degeneration; POAG primary open angle glaucoma; EBMD epithelial/anterior basement membrane dystrophy; ACIOL anterior chamber  intraocular lens; IOL intraocular lens; PCIOL posterior chamber intraocular lens; Phaco/IOL phacoemulsification with intraocular lens placement; [place] photorefractive keratectomy; LASIK laser assisted in situ keratomileusis; HTN hypertension; DM diabetes mellitus; COPD chronic obstructive pulmonary disease

## 2020-02-18 ENCOUNTER — Encounter (INDEPENDENT_AMBULATORY_CARE_PROVIDER_SITE_OTHER): Payer: Medicare HMO | Admitting: Ophthalmology

## 2020-02-19 ENCOUNTER — Other Ambulatory Visit: Payer: Self-pay

## 2020-02-19 ENCOUNTER — Encounter (INDEPENDENT_AMBULATORY_CARE_PROVIDER_SITE_OTHER): Payer: Self-pay | Admitting: Ophthalmology

## 2020-02-19 ENCOUNTER — Ambulatory Visit (INDEPENDENT_AMBULATORY_CARE_PROVIDER_SITE_OTHER): Payer: Medicare HMO | Admitting: Ophthalmology

## 2020-02-19 DIAGNOSIS — H35412 Lattice degeneration of retina, left eye: Secondary | ICD-10-CM | POA: Diagnosis not present

## 2020-02-19 DIAGNOSIS — H353211 Exudative age-related macular degeneration, right eye, with active choroidal neovascularization: Secondary | ICD-10-CM

## 2020-02-19 DIAGNOSIS — H35711 Central serous chorioretinopathy, right eye: Secondary | ICD-10-CM

## 2020-02-19 DIAGNOSIS — I10 Essential (primary) hypertension: Secondary | ICD-10-CM

## 2020-02-19 DIAGNOSIS — H3581 Retinal edema: Secondary | ICD-10-CM

## 2020-02-19 DIAGNOSIS — H25813 Combined forms of age-related cataract, bilateral: Secondary | ICD-10-CM | POA: Diagnosis not present

## 2020-02-19 DIAGNOSIS — H35033 Hypertensive retinopathy, bilateral: Secondary | ICD-10-CM | POA: Diagnosis not present

## 2020-02-19 DIAGNOSIS — H43811 Vitreous degeneration, right eye: Secondary | ICD-10-CM

## 2020-02-19 MED ORDER — AFLIBERCEPT 2MG/0.05ML IZ SOLN FOR KALEIDOSCOPE
2.0000 mg | INTRAVITREAL | Status: AC | PRN
  Administered 2020-02-19: 2 mg via INTRAVITREAL

## 2020-03-19 NOTE — Progress Notes (Signed)
[facility] Note  03/24/2020      CHIEF COMPLAINT Patient presents for Retina Follow Up   HISTORY OF PRESENT ILLNESS: Darlene Crawford is a 79 y.o. female who presents to the clinic today for:   HPI    Retina Follow Up    Patient presents with  Wet AMD.  In right eye.  This started 4 weeks ago.  Severity is moderate.  I, the attending physician,  performed the HPI with the patient and updated documentation appropriately.          Comments    Patient here for 4 weeks retina follow up for exu ARMD OD. Patient states vision about the same. Not much difference. No eye pain.        Last edited by Bernarda Caffey, MD on 03/25/2020 10:21 PM. (History)    Pt notes no change in vision OU since last visit  Referring physician: Monico Blitz, MD [place],  [place] [phone]  HISTORICAL INFORMATION:   Selected notes from the MEDICAL RECORD NUMBER Referred by Dr. Leander Rams for concern of ARMD OD with progression to CNVM vs CSR    CURRENT MEDICATIONS: Current Outpatient Medications (Ophthalmic Drugs)  Medication Sig  . ARTIFICIAL TEAR SOLUTION OP Place 1 drop into both eyes daily as needed (dry eyes).   Current Facility-Administered Medications (Ophthalmic Drugs)  Medication Route  . aflibercept (EYLEA) SOLN 2 mg Intravitreal  . aflibercept (EYLEA) SOLN 2 mg Intravitreal  . aflibercept (EYLEA) SOLN 2 mg Intravitreal   Current Outpatient Medications (Other)  Medication Sig  . acidophilus (RISAQUAD) CAPS capsule Take 1 capsule by mouth daily.  Marland Kitchen atorvastatin (LIPITOR) 10 MG tablet Take 10 mg by mouth at bedtime.   . Cholecalciferol (VITAMIN D) 50 MCG (2000 UT) tablet Take 4,000 Units by mouth at bedtime.   . Cyanocobalamin (B-12 PO) Take 1 Dose by mouth 2 (two) times a week.  . fluticasone (FLONASE) 50 MCG/ACT nasal spray Place 1 spray into both nostrils daily as needed for allergies or rhinitis.  . hydrochlorothiazide (HYDRODIURIL) 25 MG tablet Take  25 mg by mouth daily. 1/2 tablet am, 1/2 tablet pm  . levothyroxine (SYNTHROID, LEVOTHROID) 100 MCG tablet Take 100 mcg by mouth daily before breakfast.  . loratadine (CLARITIN) 10 MG tablet Take 10 mg by mouth daily as needed for allergies.   Marland Kitchen losartan (COZAAR) 100 MG tablet Take 100 mg by mouth daily.   . Melatonin 10 MG CAPS Take 10 mg by mouth at bedtime.  . Multiple Vitamins-Minerals (PRESERVISION AREDS 2 PO) Take 1 tablet by mouth at bedtime.   . sertraline (ZOLOFT) 50 MG tablet Take 50 mg by mouth daily.  . sodium chloride (OCEAN) 0.65 % SOLN nasal spray Place 1 spray into both nostrils as needed for congestion.   Current Facility-Administered Medications (Other)  Medication Route  . Bevacizumab (AVASTIN) SOLN 1.25 mg Intravitreal  . Bevacizumab (AVASTIN) SOLN 1.25 mg Intravitreal  . Bevacizumab (AVASTIN) SOLN 1.25 mg Intravitreal      REVIEW OF SYSTEMS: ROS    Positive for: Genitourinary, Musculoskeletal, Endocrine, Eyes, Psychiatric, Allergic/Imm   Negative for: Constitutional, Gastrointestinal, Neurological, Skin, HENT, Cardiovascular, Respiratory, Heme/Lymph   Last edited by Theodore Demark, COA on 03/24/2020  1:49 PM. (History)       ALLERGIES Allergies  Allergen Reactions  . Trilyte [Peg 3350-Kcl-Na Bicarb-Nacl]     HEMATEMESIS  . Vicodin [Hydrocodone-Acetaminophen] Nausea And Vomiting    PAST MEDICAL  HISTORY Past Medical History:  Diagnosis Date  . Anxiety and depression   . Cataract    Combined forms OU  . Hypercholesterolemia   . Hypertension   . Hypertensive retinopathy    OU  . Hypothyroidism   . Macular degeneration    OD  . Osteoarthritis   . Seasonal allergies    Past Surgical History:  Procedure Laterality Date  . BIOPSY  10/01/2018   Procedure: BIOPSY;  Surgeon: Danie Binder, MD;  [facility]: [facility];  Service: Endoscopy;;  gastric bx & gastric polyp  . COLONOSCOPY N/A 10/01/2018   Procedure: COLONOSCOPY;  Surgeon: Danie Binder,  MD;  Location: [facility];  Service: Endoscopy;  Laterality: N/A;  10:30am  . ESOPHAGOGASTRODUODENOSCOPY N/A 10/01/2018   Procedure: ESOPHAGOGASTRODUODENOSCOPY (EGD);  Surgeon: Danie Binder, MD;  Location: [facility];  Service: Endoscopy;  Laterality: N/A;  . POLYPECTOMY  10/01/2018   Procedure: POLYPECTOMY;  Surgeon: Danie Binder, MD;  Location: [facility];  Service: Endoscopy;;  colon   . TUBAL LIGATION    . UTERINE FIBROID SURGERY      FAMILY HISTORY Family History  Problem Relation Age of Onset  . Cancer Mother        lung  . Stroke Father   . Stroke Brother   . Colon cancer Neg Hx     SOCIAL HISTORY Social History   Tobacco Use  . Smoking status: Former Smoker    Types: Cigarettes  . Smokeless tobacco: Never Used  Vaping Use  . Vaping Use: Never used  Substance Use Topics  . Alcohol use: No  . Drug use: No         OPHTHALMIC EXAM:  Base Eye Exam    Visual Acuity (Snellen - Linear)      Right Left   Dist cc 20/40 -2 20/20 -1   Dist ph cc NI    Correction: Glasses       Tonometry (Tonopen, 1:46 PM)      Right Left   Pressure 11 11       Pupils      Dark Light Shape React APD   Right 3 2 Round Brisk None   Left 3 2 Round Brisk None       Visual Fields (Counting fingers)      Left Right    Full Full       Extraocular Movement      Right Left    Full, Ortho Full, Ortho       Neuro/Psych    Oriented x3: Yes   Mood/Affect: Normal       Dilation    Both eyes: 1.0% Mydriacyl, 2.5% Phenylephrine @ 1:46 PM        Slit Lamp and Fundus Exam    Slit Lamp Exam      Right Left   Lids/Lashes Dermatochalasis - upper lid Dermatochalasis - upper lid   Conjunctiva/Sclera White and quiet White and quiet   Cornea Arcus, early nasal band K, trace PEE Arcus, early nasal band K, 1+PEE   Anterior Chamber moderate depth, with narrow angle  moderate depth, with narrow angle temporally   Iris Round and well dilated Round and well dilated   Lens  2-3+ Nuclear sclerosis, 2-3+ Cortical cataract 2+ Nuclear sclerosis, 2+ Cortical cataract   Vitreous Vitreous syneresis, Posterior vitreous detachment, Weiss ring Vitreous syneresis       Fundus Exam      Right Left  Disc Pink and Sharp Pink and Sharp, Tilted disc   C/D Ratio 0.3 0.4   Macula blunted foveal reflex; central SRF - slightly decreased from prior; drusen; +RPE mottling and clumping Flat, good foveal reflex, Retinal pigment epithelial mottling, superior para-foveal focal area of RPE atrophy -- stable, rare drusen, No heme or edema   Vessels Vascular attenuation, Tortuous, av crossing changes Vascular attenuation, Tortuous   Periphery Attached, peripheral drusen / RPE changes nasally, peripheral cystoid degeneration Attached, mild patch of Lattice degeneration at 0600, scattered peripheral drusen, peripheral cystoid degeneration        Refraction    Wearing Rx      Sphere Cylinder Axis Add   Right +2.00 +1.25 158 +2.50   Left +2.00 +0.75 002 +2.50          IMAGING AND PROCEDURES  Imaging and Procedures for 12/11/17  OCT, Retina - OU - Both Eyes       Right Eye Quality was good. Central Foveal Thickness: 385. Progression has improved. Findings include no IRF, retinal drusen , pigment epithelial detachment, subretinal hyper-reflective material, normal foveal contour, outer retinal atrophy, no SRF (Mild interval decrease in SRF ).   Left Eye Quality was good. Central Foveal Thickness: 276. Progression has been stable. Findings include normal foveal contour, no IRF, no SRF, retinal drusen .   Notes *Images captured and stored on drive  Diagnosis / Impression:  OD: Mild interval decrease in SRF  OS: NFP, No IRF/SRF  Clinical management:  See below  Abbreviations: NFP - Normal foveal profile. CME - cystoid macular edema. PED - pigment epithelial detachment. IRF - intraretinal fluid. SRF - subretinal fluid. EZ - ellipsoid zone. ERM - epiretinal membrane. ORA - outer  retinal atrophy. ORT - outer retinal tubulation. SRHM - subretinal hyper-reflective material         Intravitreal Injection, Pharmacologic Agent - OD - Right Eye       Time Out 03/24/2020. 2:00 PM. Confirmed correct patient, procedure, site, and patient consented.   Anesthesia Topical anesthesia was used. Anesthetic medications included Lidocaine 2%, Proparacaine 0.5%.   Procedure Preparation included 5% betadine to ocular surface, eyelid speculum. A (32g) needle was used.   Injection:  2 mg aflibercept Alfonse Flavors) SOLN   NDC: A3590391, Lot: [id number], Expiration date: 06/28/2020   Route: Intravitreal, Site: Right Eye, Waste: 0.05 mL  Post-op Post injection exam found visual acuity of at least counting fingers. The patient tolerated the procedure well. There were no complications. The patient received written and verbal post procedure care education.                 ASSESSMENT/PLAN:    ICD-10-CM   1. Exudative age-related macular degeneration of right eye with active choroidal neovascularization (HCC)  H35.3211 Intravitreal Injection, Pharmacologic Agent - OD - Right Eye    aflibercept (EYLEA) SOLN 2 mg  2. Central serous chorioretinopathy of right eye  H35.711   3. Retinal edema  H35.81 OCT, Retina - OU - Both Eyes  4. Essential hypertension  I10   5. Hypertensive retinopathy of both eyes  H35.033   6. Lattice degeneration of left retina  H35.412   7. Posterior vitreous detachment of right eye  H43.811   8. Combined forms of age-related cataract of both eyes  H25.813     1-3. Exudative age related macular degeneration vs CSCR OD    - FA (03.02.19) without leakage / pooling into area of SRF  - review of systems reveals significant  stressors in life, but no exogenous steroid use  - discussed treatment with anti-VEGF vs po Eplerenone  - S/P IVA #1 OD (09.27.19),  #2 (10.25.19), #3 (11.22.19) -- minimal response  - pt approved for [employer] and IVE  - discussed  possible addition of po eplerenone (50 mg daily)  - S/P IVE OD #1 (12.20.19), #2 (01.20.20), #3 (02.18.20), #4 (07.20.20) #5 (09.01.20), #6 (10.13.20), #7 (11.20.20), #8 (01.20.21), #9 (02.17.21), #10 (03.19.21), #11 (04.20.21), #12 (05.25.21), #13 (6.23.21)  - lost to f/u from Feb 2020 to July 2020 due to COVID-19 concerns  - today, VA stable at 20/40-2  - OCT today with mild interval decrease in SRF  - recommend IVE OD #14 today, 7.27.21  - pt wishes to proceed with IVE OD  - RBA of procedure discussed, questions answered  - informed consent obtained   - Eylea informed consent form signed and scanned on 12.22.2020  - see procedure note  - [email or web address] benefits investigation initiated -- approved for 2021  - f/u 4 weeks -- DFE/OCT/ possible injection; consider changing medication or addition of eplerenone  4,5. Hypertensive retinopathy OU  - discussed importance of tight BP control  - stable  - monitor  6. Lattice degeneration OS-  - patch of inferior lattice OS -- no RT, holes or SRF  - asymptomatic  - discussed findings, prognosis, and treatment options including observation  - monitor  7. PVD / vitreous syneresis OD-   - Discussed findings and prognosis  - No RT or RD on 360 peripheral exam  - Reviewed s/s of RT/RD  - Strict return precautions for any such RT/RD signs/symptoms  8. Combined form age-related cataract OU-   - The symptoms of cataract, surgical options, and treatments and risks were discussed with patient.  - discussed diagnosis and progression  - not yet visually significant  - monitor for now  Ophthalmic Meds Ordered this visit:  Meds ordered this encounter  Medications  . aflibercept (EYLEA) SOLN 2 mg       Return in about 4 weeks (around 04/21/2020) for 4 wk f/u for exu ARMD vs CSCR OD w/DFE&OCT.  There are no Patient Instructions on file for this visit.   Explained the diagnoses, plan, and follow up with the patient and they expressed understanding.   Patient expressed understanding of the importance of proper follow up care.   This document serves as a record of services personally performed by Gardiner Sleeper, MD, PhD. It was created on their behalf by Estill Bakes, COT an ophthalmic technician. The creation of this record is the provider's dictation and/or activities during the visit.    Electronically signed by: Estill Bakes, COT 03/19/20 @ 10:25 PM   This document serves as a record of services personally performed by Gardiner Sleeper, MD, PhD. It was created on their behalf by Estill Bakes, COT an ophthalmic technician. The creation of this record is the provider's dictation and/or activities during the visit.    Electronically signed by: Estill Bakes, COT 03/24/20 @ 10:25 PM  Gardiner Sleeper, M.D., Ph.D. Diseases & Surgery of the Retina and [place] 03/24/2020   I have reviewed the above documentation for accuracy and completeness, and I agree with the above. Gardiner Sleeper, M.D., Ph.D. 03/25/20 10:25 PM   Abbreviations: M myopia (nearsighted); A astigmatism; H hyperopia (farsighted); P presbyopia; Mrx spectacle prescription;  CTL contact lenses; OD right eye; OS left eye; OU both eyes  XT  exotropia; ET esotropia; PEK punctate epithelial keratitis; PEE punctate epithelial erosions; DES dry eye syndrome; MGD meibomian gland dysfunction; ATs artificial tears; PFAT's preservative free artificial tears; [place] nuclear sclerotic cataract; PSC posterior subcapsular cataract; ERM epi-retinal membrane; PVD posterior vitreous detachment; RD retinal detachment; DM diabetes mellitus; DR diabetic retinopathy; NPDR non-proliferative diabetic retinopathy; PDR proliferative diabetic retinopathy; CSME clinically significant macular edema; DME diabetic macular edema; dbh dot blot hemorrhages; CWS cotton wool spot; POAG primary open angle glaucoma; C/D cup-to-disc ratio; HVF humphrey visual field; GVF goldmann visual  field; OCT optical coherence tomography; IOP intraocular pressure; BRVO Branch retinal vein occlusion; CRVO central retinal vein occlusion; CRAO central retinal artery occlusion; BRAO branch retinal artery occlusion; RT retinal tear; SB scleral buckle; PPV pars plana vitrectomy; VH Vitreous hemorrhage; PRP panretinal laser photocoagulation; IVK intravitreal kenalog; VMT vitreomacular traction; MH Macular hole;  NVD neovascularization of the disc; NVE neovascularization elsewhere; AREDS age related eye disease study; ARMD age related macular degeneration; POAG primary open angle glaucoma; EBMD epithelial/anterior basement membrane dystrophy; ACIOL anterior chamber intraocular lens; IOL intraocular lens; PCIOL posterior chamber intraocular lens; Phaco/IOL phacoemulsification with intraocular lens placement; [place] photorefractive keratectomy; LASIK laser assisted in situ keratomileusis; HTN hypertension; DM diabetes mellitus; COPD chronic obstructive pulmonary disease

## 2020-03-24 ENCOUNTER — Encounter (INDEPENDENT_AMBULATORY_CARE_PROVIDER_SITE_OTHER): Payer: Self-pay | Admitting: Ophthalmology

## 2020-03-24 ENCOUNTER — Ambulatory Visit (INDEPENDENT_AMBULATORY_CARE_PROVIDER_SITE_OTHER): Payer: Medicare HMO | Admitting: Ophthalmology

## 2020-03-24 ENCOUNTER — Other Ambulatory Visit: Payer: Self-pay

## 2020-03-24 VITALS — BP 123/78 | HR 73

## 2020-03-24 DIAGNOSIS — H35412 Lattice degeneration of retina, left eye: Secondary | ICD-10-CM

## 2020-03-24 DIAGNOSIS — H353211 Exudative age-related macular degeneration, right eye, with active choroidal neovascularization: Secondary | ICD-10-CM | POA: Diagnosis not present

## 2020-03-24 DIAGNOSIS — H25813 Combined forms of age-related cataract, bilateral: Secondary | ICD-10-CM | POA: Diagnosis not present

## 2020-03-24 DIAGNOSIS — I10 Essential (primary) hypertension: Secondary | ICD-10-CM | POA: Diagnosis not present

## 2020-03-24 DIAGNOSIS — H35711 Central serous chorioretinopathy, right eye: Secondary | ICD-10-CM

## 2020-03-24 DIAGNOSIS — H35033 Hypertensive retinopathy, bilateral: Secondary | ICD-10-CM | POA: Diagnosis not present

## 2020-03-24 DIAGNOSIS — H43811 Vitreous degeneration, right eye: Secondary | ICD-10-CM | POA: Diagnosis not present

## 2020-03-24 DIAGNOSIS — H3581 Retinal edema: Secondary | ICD-10-CM | POA: Diagnosis not present

## 2020-03-25 ENCOUNTER — Encounter (INDEPENDENT_AMBULATORY_CARE_PROVIDER_SITE_OTHER): Payer: Self-pay | Admitting: Ophthalmology

## 2020-03-25 MED ORDER — AFLIBERCEPT 2MG/0.05ML IZ SOLN FOR KALEIDOSCOPE
2.0000 mg | INTRAVITREAL | Status: AC | PRN
  Administered 2020-03-25: 2 mg via INTRAVITREAL

## 2020-03-26 DIAGNOSIS — Z299 Encounter for prophylactic measures, unspecified: Secondary | ICD-10-CM | POA: Diagnosis not present

## 2020-03-26 DIAGNOSIS — Z1339 Encounter for screening examination for other mental health and behavioral disorders: Secondary | ICD-10-CM | POA: Diagnosis not present

## 2020-03-26 DIAGNOSIS — N183 Chronic kidney disease, stage 3 unspecified: Secondary | ICD-10-CM | POA: Diagnosis not present

## 2020-03-26 DIAGNOSIS — E039 Hypothyroidism, unspecified: Secondary | ICD-10-CM | POA: Diagnosis not present

## 2020-03-26 DIAGNOSIS — Z0000 Encounter for general adult medical examination without abnormal findings: Secondary | ICD-10-CM | POA: Diagnosis not present

## 2020-03-26 DIAGNOSIS — E7800 Pure hypercholesterolemia, unspecified: Secondary | ICD-10-CM | POA: Diagnosis not present

## 2020-03-26 DIAGNOSIS — I10 Essential (primary) hypertension: Secondary | ICD-10-CM | POA: Diagnosis not present

## 2020-03-26 DIAGNOSIS — Z7189 Other specified counseling: Secondary | ICD-10-CM | POA: Diagnosis not present

## 2020-03-26 DIAGNOSIS — Z1331 Encounter for screening for depression: Secondary | ICD-10-CM | POA: Diagnosis not present

## 2020-03-31 ENCOUNTER — Other Ambulatory Visit (HOSPITAL_COMMUNITY): Payer: Self-pay | Admitting: Nurse Practitioner

## 2020-03-31 ENCOUNTER — Ambulatory Visit (HOSPITAL_COMMUNITY): Payer: Medicare HMO

## 2020-03-31 DIAGNOSIS — Z0000 Encounter for general adult medical examination without abnormal findings: Secondary | ICD-10-CM | POA: Diagnosis not present

## 2020-03-31 DIAGNOSIS — Z79899 Other long term (current) drug therapy: Secondary | ICD-10-CM | POA: Diagnosis not present

## 2020-03-31 DIAGNOSIS — Z1231 Encounter for screening mammogram for malignant neoplasm of breast: Secondary | ICD-10-CM

## 2020-03-31 DIAGNOSIS — E039 Hypothyroidism, unspecified: Secondary | ICD-10-CM | POA: Diagnosis not present

## 2020-03-31 DIAGNOSIS — E7800 Pure hypercholesterolemia, unspecified: Secondary | ICD-10-CM | POA: Diagnosis not present

## 2020-04-17 ENCOUNTER — Ambulatory Visit (HOSPITAL_COMMUNITY): Payer: Medicare HMO

## 2020-04-23 NOTE — Progress Notes (Signed)
[facility] Note  04/27/2020      CHIEF COMPLAINT Patient presents for Retina Follow Up   HISTORY OF PRESENT ILLNESS: Darlene Crawford is a 79 y.o. female who presents to the clinic today for:   HPI    Retina Follow Up    Patient presents with  Wet AMD.  In right eye.  This started weeks ago.  Severity is moderate.  Duration of weeks.  Since onset it is stable.  I, the attending physician,  performed the HPI with the patient and updated documentation appropriately.          Comments    Pt states vision is the same OU.  Pt denies eye pain or discomfort and denies any new or worsening floaters or fol OU.       Last edited by Bernarda Caffey, MD on 04/27/2020  2:31 PM. (History)    Pt states no change in vision, she is going to talk to her PCP about possibly starting eplerenone   Referring physician: Leticia Clas, [facility] [address],  [place] [address]  HISTORICAL INFORMATION:   Selected notes from the MEDICAL RECORD NUMBER Referred by Dr. Leander Rams for concern of ARMD OD with progression to CNVM vs CSR    CURRENT MEDICATIONS: Current Outpatient Medications (Ophthalmic Drugs)  Medication Sig  . ARTIFICIAL TEAR SOLUTION OP Place 1 drop into both eyes daily as needed (dry eyes).   Current Facility-Administered Medications (Ophthalmic Drugs)  Medication Route  . aflibercept (EYLEA) SOLN 2 mg Intravitreal  . aflibercept (EYLEA) SOLN 2 mg Intravitreal  . aflibercept (EYLEA) SOLN 2 mg Intravitreal   Current Outpatient Medications (Other)  Medication Sig  . acidophilus (RISAQUAD) CAPS capsule Take 1 capsule by mouth daily.  Marland Kitchen atorvastatin (LIPITOR) 10 MG tablet Take 10 mg by mouth at bedtime.   . Cholecalciferol (VITAMIN D) 50 MCG (2000 UT) tablet Take 4,000 Units by mouth at bedtime.   . Cyanocobalamin (B-12 PO) Take 1 Dose by mouth 2 (two) times a week.  . fluticasone (FLONASE) 50 MCG/ACT nasal spray Place 1 spray into both nostrils  daily as needed for allergies or rhinitis.  . hydrochlorothiazide (HYDRODIURIL) 25 MG tablet Take 25 mg by mouth daily. 1/2 tablet am, 1/2 tablet pm  . levothyroxine (SYNTHROID, LEVOTHROID) 100 MCG tablet Take 100 mcg by mouth daily before breakfast.  . loratadine (CLARITIN) 10 MG tablet Take 10 mg by mouth daily as needed for allergies.   Marland Kitchen losartan (COZAAR) 100 MG tablet Take 100 mg by mouth daily.   . Melatonin 10 MG CAPS Take 10 mg by mouth at bedtime.  . Multiple Vitamins-Minerals (PRESERVISION AREDS 2 PO) Take 1 tablet by mouth at bedtime.   . sertraline (ZOLOFT) 50 MG tablet Take 50 mg by mouth daily.  . sodium chloride (OCEAN) 0.65 % SOLN nasal spray Place 1 spray into both nostrils as needed for congestion.   Current Facility-Administered Medications (Other)  Medication Route  . Bevacizumab (AVASTIN) SOLN 1.25 mg Intravitreal  . Bevacizumab (AVASTIN) SOLN 1.25 mg Intravitreal  . Bevacizumab (AVASTIN) SOLN 1.25 mg Intravitreal      REVIEW OF SYSTEMS: ROS    Positive for: Genitourinary, Musculoskeletal, Endocrine, Eyes, Psychiatric, Allergic/Imm   Negative for: Constitutional, Gastrointestinal, Neurological, Skin, HENT, Cardiovascular, Respiratory, Heme/Lymph   Last edited by Doneen Poisson on 04/27/2020  1:43 PM. (History)       ALLERGIES Allergies  Allergen Reactions  .  Trilyte [Peg 3350-Kcl-Na Bicarb-Nacl]     HEMATEMESIS  . Vicodin [Hydrocodone-Acetaminophen] Nausea And Vomiting    PAST MEDICAL HISTORY Past Medical History:  Diagnosis Date  . Anxiety and depression   . Cataract    Combined forms OU  . Hypercholesterolemia   . Hypertension   . Hypertensive retinopathy    OU  . Hypothyroidism   . Macular degeneration    OD  . Osteoarthritis   . Seasonal allergies    Past Surgical History:  Procedure Laterality Date  . BIOPSY  10/01/2018   Procedure: BIOPSY;  Surgeon: Danie Binder, MD;  [facility];  Service: Endoscopy;;  gastric bx &  gastric polyp  . COLONOSCOPY N/A 10/01/2018   Procedure: COLONOSCOPY;  Surgeon: Danie Binder, MD;  [facility]: [facility];  Service: Endoscopy;  Laterality: N/A;  10:30am  . ESOPHAGOGASTRODUODENOSCOPY N/A 10/01/2018   Procedure: ESOPHAGOGASTRODUODENOSCOPY (EGD);  Surgeon: Danie Binder, MD;  [facility]: [facility];  Service: Endoscopy;  Laterality: N/A;  . POLYPECTOMY  10/01/2018   Procedure: POLYPECTOMY;  Surgeon: Danie Binder, MD;  [facility]: [facility];  Service: Endoscopy;;  colon   . TUBAL LIGATION    . UTERINE FIBROID SURGERY      FAMILY HISTORY Family History  Problem Relation Age of Onset  . Cancer Mother        lung  . Stroke Father   . Stroke Brother   . Colon cancer Neg Hx     SOCIAL HISTORY Social History   Tobacco Use  . Smoking status: Former Smoker    Types: Cigarettes  . Smokeless tobacco: Never Used  Vaping Use  . Vaping Use: Never used  Substance Use Topics  . Alcohol use: No  . Drug use: No         OPHTHALMIC EXAM:  Base Eye Exam    Visual Acuity (Snellen - Linear)      Right Left   Dist cc 20/50 -2 20/20 -1   Dist ph cc 20/40 -1    Correction: Glasses       Tonometry (Tonopen, 1:47 PM)      Right Left   Pressure 14 15       Pupils      Dark Light Shape React APD   Right 3 2 Round Brisk 0   Left 3 2 Round Brisk 0       Visual Fields      Left Right    Full Full       Extraocular Movement      Right Left    Full Full       Neuro/Psych    Oriented x3: Yes   Mood/Affect: Normal       Dilation    Right eye: 1.0% Mydriacyl, 2.5% Phenylephrine @ 1:48 PM        Slit Lamp and Fundus Exam    Slit Lamp Exam      Right Left   Lids/Lashes Dermatochalasis - upper lid Dermatochalasis - upper lid   Conjunctiva/Sclera White and quiet White and quiet   Cornea Arcus, early nasal band K, trace PEE Arcus, early nasal band K, 1+PEE   Anterior Chamber moderate depth, with narrow angle  moderate depth, with narrow angle  temporally   Iris Round and well dilated Round and well dilated   Lens 2-3+ Nuclear sclerosis, 2-3+ Cortical cataract 2+ Nuclear sclerosis, 2+ Cortical cataract   Vitreous Vitreous syneresis, Posterior vitreous detachment, Weiss ring  Vitreous syneresis       Fundus Exam      Right Left   Disc Pink and Sharp Pink and Sharp, Tilted disc   C/D Ratio 0.3 0.4   Macula blunted foveal reflex; central SRF - persistent, drusen; +RPE mottling and clumping, no heme Flat, good foveal reflex, Retinal pigment epithelial mottling, superior para-foveal focal area of RPE atrophy -- stable, rare drusen, No heme or edema   Vessels Vascular attenuation, Tortuous, av crossing changes Vascular attenuation, Tortuous   Periphery Attached, peripheral drusen / RPE changes nasally, peripheral cystoid degeneration Attached, mild patch of Lattice degeneration at 0600, scattered peripheral drusen, peripheral cystoid degeneration        Refraction    Wearing Rx      Sphere Cylinder Axis Add   Right +2.00 +1.25 158 +2.50   Left +2.00 +0.75 002 +2.50          IMAGING AND PROCEDURES  Imaging and Procedures for 12/11/17  OCT, Retina - OU - Both Eyes       Right Eye Quality was good. Central Foveal Thickness: 384. Progression has been stable. Findings include no IRF, retinal drusen , pigment epithelial detachment, subretinal hyper-reflective material, normal foveal contour, outer retinal atrophy, no SRF (Persistent SRF -- ?tr improvement).   Left Eye Quality was good. Central Foveal Thickness: 278. Progression has been stable. Findings include normal foveal contour, no IRF, no SRF, retinal drusen .   Notes *Images captured and stored on drive  Diagnosis / Impression:  OD: persistent SRF -- ?tr improvement OS: NFP, No IRF/SRF  Clinical management:  See below  Abbreviations: NFP - Normal foveal profile. CME - cystoid macular edema. PED - pigment epithelial detachment. IRF - intraretinal fluid. SRF -  subretinal fluid. EZ - ellipsoid zone. ERM - epiretinal membrane. ORA - outer retinal atrophy. ORT - outer retinal tubulation. SRHM - subretinal hyper-reflective material         Intravitreal Injection, Pharmacologic Agent - OD - Right Eye       Time Out 04/27/2020. 2:34 PM. Confirmed correct patient, procedure, site, and patient consented.   Anesthesia Topical anesthesia was used. Anesthetic medications included Lidocaine 2%, Proparacaine 0.5%.   Procedure Preparation included 5% betadine to ocular surface, eyelid speculum. A (32g) needle was used.   Injection:  2 mg aflibercept Alfonse Flavors) SOLN   NDC: A3590391, Lot: [id number], Expiration date: 07/28/2020   Route: Intravitreal, Site: Right Eye, Waste: 0.05 mL  Post-op Post injection exam found visual acuity of at least counting fingers. The patient tolerated the procedure well. There were no complications. The patient received written and verbal post procedure care education. Post injection medications were not given.                 ASSESSMENT/PLAN:    ICD-10-CM   1. Central serous chorioretinopathy of right eye  H35.711   2. Retinal edema  H35.81 OCT, Retina - OU - Both Eyes  3. Exudative age-related macular degeneration of right eye with active choroidal neovascularization (HCC)  H35.3211 Intravitreal Injection, Pharmacologic Agent - OD - Right Eye    aflibercept (EYLEA) SOLN 2 mg  4. Hypertensive retinopathy of both eyes  H35.033   5. Essential hypertension  I10   6. Lattice degeneration of left retina  H35.412   7. Posterior vitreous detachment of right eye  H43.811   8. Combined forms of age-related cataract of both eyes  H25.813     1-3. Exudative age related macular degeneration vs  CSCR OD    - FA (03.02.19) without leakage / pooling into area of SRF  - review of systems reveals significant stressors in life, but no exogenous steroid use  - discussed treatment with anti-VEGF vs po Eplerenone  - S/P IVA #1  OD (09.27.19),  #2 (10.25.19), #3 (11.22.19) -- minimal response  - pt approved for [employer] and IVE  - S/P IVE OD #1 (12.20.19), #2 (01.20.20), #3 (02.18.20), #4 (07.20.20) #5 (09.01.20), #6 (10.13.20), #7 (11.20.20), #8 (01.20.21), #9 (02.17.21), #10 (03.19.21), #11 (04.20.21), #12 (05.25.21), #13 (6.23.21), #14 (07.27.21)  - lost to f/u from Feb 2020 to July 2020 due to COVID-19 concerns  - today, VA stable at 20/40-2  - OCT today with persistent SRF  - discussed possible addition of po eplerenone (50 mg daily) -- pt initially had concerns about effects on kidney function, but states most recent labs show improved kidney fxn -- pt to discuss with primary care provider, Nicanor Bake  - recommend IVE OD #15 today, 08.30.21  - pt wishes to proceed with IVE OD  - RBA of procedure discussed, questions answered  - informed consent obtained   - Eylea informed consent form signed and scanned on 12.22.2020  - see procedure note  - [employer] benefits investigation initiated -- approved for 2021  - f/u 4 weeks -- DFE/OCT/ possible injection; consider changing medication or addition of eplerenone  4,5. Hypertensive retinopathy OU  - discussed importance of tight BP control  - stable  - monitor  6. Lattice degeneration OS-  - patch of inferior lattice OS -- no RT, holes or SRF  - asymptomatic  - discussed findings, prognosis, and treatment options including observation  - monitor  7. PVD / vitreous syneresis OD-   - Discussed findings and prognosis  - No RT or RD on 360 peripheral exam  - Reviewed s/s of RT/RD  - Strict return precautions for any such RT/RD signs/symptoms  8. Combined form age-related cataract OU-   - The symptoms of cataract, surgical options, and treatments and risks were discussed with patient.  - discussed diagnosis and progression  - not yet visually significant  - monitor for now  Ophthalmic Meds Ordered this visit:  Meds ordered this encounter  Medications   . aflibercept (EYLEA) SOLN 2 mg       Return in about 4 weeks (around 05/25/2020) for f/u exu ARMD OD, DFE, OCT.  There are no Patient Instructions on file for this visit.  This document serves as a record of services personally performed by Gardiner Sleeper, MD, PhD. It was created on their behalf by Leeann Must, [place], an ophthalmic technician. The creation of this record is the provider's dictation and/or activities during the visit.    Electronically signed by: Leeann Must, COA 08.26.2021 4:48 PM   This document serves as a record of services personally performed by Gardiner Sleeper, MD, PhD. It was created on their behalf by San Jetty. Owens Shark, OA an ophthalmic technician. The creation of this record is the provider's dictation and/or activities during the visit.    Electronically signed by: San Jetty. Owens Shark, [place] 08.30.2021 4:48 PM  Gardiner Sleeper, M.D., Ph.D. Diseases & Surgery of the Retina and [place] 04/27/2020   I have reviewed the above documentation for accuracy and completeness, and I agree with the above. Gardiner Sleeper, M.D., Ph.D. 04/27/20 4:48 PM    Abbreviations: M myopia (nearsighted); A astigmatism; H hyperopia (farsighted); P  presbyopia; Mrx spectacle prescription;  CTL contact lenses; OD right eye; OS left eye; OU both eyes  XT exotropia; ET esotropia; PEK punctate epithelial keratitis; PEE punctate epithelial erosions; DES dry eye syndrome; MGD meibomian gland dysfunction; ATs artificial tears; PFAT's preservative free artificial tears; [place] nuclear sclerotic cataract; PSC posterior subcapsular cataract; ERM epi-retinal membrane; PVD posterior vitreous detachment; RD retinal detachment; DM diabetes mellitus; DR diabetic retinopathy; NPDR non-proliferative diabetic retinopathy; PDR proliferative diabetic retinopathy; CSME clinically significant macular edema; DME diabetic macular edema; dbh dot blot hemorrhages; CWS cotton wool spot;  POAG primary open angle glaucoma; C/D cup-to-disc ratio; HVF humphrey visual field; GVF goldmann visual field; OCT optical coherence tomography; IOP intraocular pressure; BRVO Branch retinal vein occlusion; CRVO central retinal vein occlusion; CRAO central retinal artery occlusion; BRAO branch retinal artery occlusion; RT retinal tear; SB scleral buckle; PPV pars plana vitrectomy; VH Vitreous hemorrhage; PRP panretinal laser photocoagulation; IVK intravitreal kenalog; VMT vitreomacular traction; MH Macular hole;  NVD neovascularization of the disc; NVE neovascularization elsewhere; AREDS age related eye disease study; ARMD age related macular degeneration; POAG primary open angle glaucoma; EBMD epithelial/anterior basement membrane dystrophy; ACIOL anterior chamber intraocular lens; IOL intraocular lens; PCIOL posterior chamber intraocular lens; Phaco/IOL phacoemulsification with intraocular lens placement; [place] photorefractive keratectomy; LASIK laser assisted in situ keratomileusis; HTN hypertension; DM diabetes mellitus; COPD chronic obstructive pulmonary disease

## 2020-04-27 ENCOUNTER — Other Ambulatory Visit: Payer: Self-pay

## 2020-04-27 ENCOUNTER — Ambulatory Visit (INDEPENDENT_AMBULATORY_CARE_PROVIDER_SITE_OTHER): Payer: Medicare HMO | Admitting: Ophthalmology

## 2020-04-27 ENCOUNTER — Encounter (INDEPENDENT_AMBULATORY_CARE_PROVIDER_SITE_OTHER): Payer: Self-pay | Admitting: Ophthalmology

## 2020-04-27 DIAGNOSIS — H35412 Lattice degeneration of retina, left eye: Secondary | ICD-10-CM | POA: Diagnosis not present

## 2020-04-27 DIAGNOSIS — H43811 Vitreous degeneration, right eye: Secondary | ICD-10-CM | POA: Diagnosis not present

## 2020-04-27 DIAGNOSIS — H353211 Exudative age-related macular degeneration, right eye, with active choroidal neovascularization: Secondary | ICD-10-CM | POA: Diagnosis not present

## 2020-04-27 DIAGNOSIS — I10 Essential (primary) hypertension: Secondary | ICD-10-CM

## 2020-04-27 DIAGNOSIS — H25813 Combined forms of age-related cataract, bilateral: Secondary | ICD-10-CM

## 2020-04-27 DIAGNOSIS — H35711 Central serous chorioretinopathy, right eye: Secondary | ICD-10-CM

## 2020-04-27 DIAGNOSIS — H3581 Retinal edema: Secondary | ICD-10-CM

## 2020-04-27 DIAGNOSIS — H35033 Hypertensive retinopathy, bilateral: Secondary | ICD-10-CM | POA: Diagnosis not present

## 2020-04-27 MED ORDER — AFLIBERCEPT 2MG/0.05ML IZ SOLN FOR KALEIDOSCOPE
2.0000 mg | INTRAVITREAL | Status: AC | PRN
  Administered 2020-04-27: 2 mg via INTRAVITREAL

## 2020-04-30 ENCOUNTER — Ambulatory Visit (HOSPITAL_COMMUNITY)
Admission: RE | Admit: 2020-04-30 | Discharge: 2020-04-30 | Disposition: A | Discharge status: Home / Self Care | Payer: Medicare HMO | Source: Ambulatory Visit | Attending: Nurse Practitioner | Admitting: Nurse Practitioner

## 2020-04-30 ENCOUNTER — Other Ambulatory Visit: Payer: Self-pay

## 2020-04-30 DIAGNOSIS — Z1231 Encounter for screening mammogram for malignant neoplasm of breast: Secondary | ICD-10-CM

## 2020-05-22 NOTE — Progress Notes (Signed)
[facility] Note  05/26/2020      CHIEF COMPLAINT Patient presents for Retina Follow Up   HISTORY OF PRESENT ILLNESS: Darlene Crawford is a 79 y.o. female who presents to the clinic today for:   HPI    Retina Follow Up    Patient presents with  Wet AMD.  In right eye.  This started 4 weeks ago.  I, the attending physician,  performed the HPI with the patient and updated documentation appropriately.          Comments    Patient here for 4 weeks retina follow up for exu ARMD OD. Patient states vision doing a little bit better. No eye pain. Didn't want OS dilated.       Last edited by Bernarda Caffey, MD on 05/26/2020  1:52 PM. (History)    Pt states right eye vision has improved, she states she can tell mostly when looking at her [place] grid  Referring physician: Berenice Primas [place],  [place] [id number]  HISTORICAL INFORMATION:   Selected notes from the MEDICAL RECORD NUMBER Referred by Dr. Leander Rams for concern of ARMD OD with progression to CNVM vs CSR    CURRENT MEDICATIONS: Current Outpatient Medications (Ophthalmic Drugs)  Medication Sig  . ARTIFICIAL TEAR SOLUTION OP Place 1 drop into both eyes daily as needed (dry eyes).   Current Facility-Administered Medications (Ophthalmic Drugs)  Medication Route  . aflibercept (EYLEA) SOLN 2 mg Intravitreal  . aflibercept (EYLEA) SOLN 2 mg Intravitreal  . aflibercept (EYLEA) SOLN 2 mg Intravitreal   Current Outpatient Medications (Other)  Medication Sig  . acidophilus (RISAQUAD) CAPS capsule Take 1 capsule by mouth daily.  Marland Kitchen atorvastatin (LIPITOR) 10 MG tablet Take 10 mg by mouth at bedtime.   . Cholecalciferol (VITAMIN D) 50 MCG (2000 UT) tablet Take 4,000 Units by mouth at bedtime.   . Cyanocobalamin (B-12 PO) Take 1 Dose by mouth 2 (two) times a week.  . fluticasone (FLONASE) 50 MCG/ACT nasal spray Place 1 spray into both nostrils daily as needed for allergies or rhinitis.  .  hydrochlorothiazide (HYDRODIURIL) 25 MG tablet Take 25 mg by mouth daily. 1/2 tablet am, 1/2 tablet pm  . levothyroxine (SYNTHROID, LEVOTHROID) 100 MCG tablet Take 100 mcg by mouth daily before breakfast.  . loratadine (CLARITIN) 10 MG tablet Take 10 mg by mouth daily as needed for allergies.   Marland Kitchen losartan (COZAAR) 100 MG tablet Take 100 mg by mouth daily.   . Melatonin 10 MG CAPS Take 10 mg by mouth at bedtime.  . Multiple Vitamins-Minerals (PRESERVISION AREDS 2 PO) Take 1 tablet by mouth at bedtime.   . sertraline (ZOLOFT) 50 MG tablet Take 50 mg by mouth daily.  . sodium chloride (OCEAN) 0.65 % SOLN nasal spray Place 1 spray into both nostrils as needed for congestion.   Current Facility-Administered Medications (Other)  Medication Route  . Bevacizumab (AVASTIN) SOLN 1.25 mg Intravitreal  . Bevacizumab (AVASTIN) SOLN 1.25 mg Intravitreal  . Bevacizumab (AVASTIN) SOLN 1.25 mg Intravitreal      REVIEW OF SYSTEMS: ROS    Positive for: Genitourinary, Musculoskeletal, Endocrine, Eyes, Psychiatric, Allergic/Imm   Negative for: Constitutional, Gastrointestinal, Neurological, Skin, HENT, Cardiovascular, Respiratory, Heme/Lymph   Last edited by Theodore Demark, COA on 05/26/2020  1:36 PM. (History)       ALLERGIES Allergies  Allergen Reactions  . Trilyte [Peg 3350-Kcl-Na Bicarb-Nacl]     HEMATEMESIS  . Vicodin [Hydrocodone-Acetaminophen]  Nausea And Vomiting    PAST MEDICAL HISTORY Past Medical History:  Diagnosis Date  . Anxiety and depression   . Cataract    Combined forms OU  . Hypercholesterolemia   . Hypertension   . Hypertensive retinopathy    OU  . Hypothyroidism   . Macular degeneration    OD  . Osteoarthritis   . Seasonal allergies    Past Surgical History:  Procedure Laterality Date  . BIOPSY  10/01/2018   Procedure: BIOPSY;  Surgeon: Danie Binder, MD;  [facility]: [facility];  Service: Endoscopy;;  gastric bx & gastric polyp  . COLONOSCOPY N/A 10/01/2018    Procedure: COLONOSCOPY;  Surgeon: Danie Binder, MD;  Location: [facility];  Service: Endoscopy;  Laterality: N/A;  10:30am  . ESOPHAGOGASTRODUODENOSCOPY N/A 10/01/2018   Procedure: ESOPHAGOGASTRODUODENOSCOPY (EGD);  Surgeon: Danie Binder, MD;  Location: [facility];  Service: Endoscopy;  Laterality: N/A;  . POLYPECTOMY  10/01/2018   Procedure: POLYPECTOMY;  Surgeon: Danie Binder, MD;  Location: [facility];  Service: Endoscopy;;  colon   . TUBAL LIGATION    . UTERINE FIBROID SURGERY      FAMILY HISTORY Family History  Problem Relation Age of Onset  . Cancer Mother        lung  . Stroke Father   . Stroke Brother   . Colon cancer Neg Hx     SOCIAL HISTORY Social History   Tobacco Use  . Smoking status: Former Smoker    Types: Cigarettes  . Smokeless tobacco: Never Used  Vaping Use  . Vaping Use: Never used  Substance Use Topics  . Alcohol use: No  . Drug use: No         OPHTHALMIC EXAM:  Base Eye Exam    Visual Acuity (Snellen - Linear)      Right Left   Dist cc 20/50 20/25 -2   Dist ph cc 20/40 -2 20/25 +1   Correction: Glasses       Tonometry (Tonopen, 1:33 PM)      Right Left   Pressure 14 13       Pupils      Dark Light Shape React APD   Right 3 2 Round Brisk None   Left 3 2 Round Brisk None       Visual Fields (Counting fingers)      Left Right    Full Full       Extraocular Movement      Right Left    Full, Ortho Full, Ortho       Neuro/Psych    Oriented x3: Yes   Mood/Affect: Normal       Dilation    Right eye: 1.0% Mydriacyl, 2.5% Phenylephrine @ 1:33 PM  Patient didn't want OS dilated.        Slit Lamp and Fundus Exam    Slit Lamp Exam      Right Left   Lids/Lashes Dermatochalasis - upper lid Dermatochalasis - upper lid   Conjunctiva/Sclera White and quiet White and quiet   Cornea Arcus, early nasal band K, trace PEE Arcus, early nasal band K, 1+PEE   Anterior Chamber moderate depth, with narrow angle  moderate  depth, with narrow angle temporally   Iris Round and well dilated Round and well dilated   Lens 2-3+ Nuclear sclerosis, 2-3+ Cortical cataract 2+ Nuclear sclerosis, 2+ Cortical cataract   Vitreous Vitreous syneresis, Posterior vitreous detachment, Weiss ring Vitreous syneresis  Fundus Exam      Right Left   Disc Pink and Sharp, Compact Pink and Sharp, Tilted disc   C/D Ratio 0.3 0.4   Macula blunted foveal reflex; central SRF - improved, drusen; +RPE mottling and clumping, no heme Flat, good foveal reflex, Retinal pigment epithelial mottling, superior para-foveal focal area of RPE atrophy -- stable, rare drusen, No heme or edema   Vessels Vascular attenuation, Tortuous Vascular attenuation, Tortuous   Periphery Attached, peripheral drusen / RPE changes nasally, peripheral cystoid degeneration Attached, mild patch of Lattice degeneration at 0600, scattered peripheral drusen, peripheral cystoid degeneration        Refraction    Wearing Rx      Sphere Cylinder Axis Add   Right +2.00 +1.25 158 +2.50   Left +2.00 +0.75 002 +2.50          IMAGING AND PROCEDURES  Imaging and Procedures for 12/11/17  OCT, Retina - OU - Both Eyes       Right Eye Quality was good. Central Foveal Thickness: 286. Progression has improved. Findings include no IRF, retinal drusen , pigment epithelial detachment, subretinal hyper-reflective material, normal foveal contour, outer retinal atrophy, subretinal fluid (Interval improvement in SRF -- just trace sliver remains).   Left Eye Quality was good. Central Foveal Thickness: 279. Progression has been stable. Findings include normal foveal contour, no IRF, no SRF, retinal drusen .   Notes *Images captured and stored on drive  Diagnosis / Impression:  OD: Interval improvement in SRF -- just trace sliver remains OS: NFP, No IRF/SRF  Clinical management:  See below  Abbreviations: NFP - Normal foveal profile. CME - cystoid macular edema. PED -  pigment epithelial detachment. IRF - intraretinal fluid. SRF - subretinal fluid. EZ - ellipsoid zone. ERM - epiretinal membrane. ORA - outer retinal atrophy. ORT - outer retinal tubulation. SRHM - subretinal hyper-reflective material         Intravitreal Injection, Pharmacologic Agent - OD - Right Eye       Time Out 05/26/2020. 1:38 PM. Confirmed correct patient, procedure, site, and patient consented.   Anesthesia Topical anesthesia was used. Anesthetic medications included Lidocaine 2%, Proparacaine 0.5%.   Procedure Preparation included 5% betadine to ocular surface, eyelid speculum. A (32g) needle was used.   Injection:  2 mg aflibercept Alfonse Flavors) SOLN   NDC: A3590391, Lot: [id number], Expiration date: 07/29/2020   Route: Intravitreal, Site: Right Eye, Waste: 0.05 mL  Post-op Post injection exam found visual acuity of at least counting fingers. The patient tolerated the procedure well. There were no complications. The patient received written and verbal post procedure care education. Post injection medications were not given.                 ASSESSMENT/PLAN:    ICD-10-CM   1. Exudative age-related macular degeneration of right eye with active choroidal neovascularization (HCC)  H35.3211 Intravitreal Injection, Pharmacologic Agent - OD - Right Eye    aflibercept (EYLEA) SOLN 2 mg  2. Central serous chorioretinopathy of right eye  H35.711   3. Retinal edema  H35.81 OCT, Retina - OU - Both Eyes  4. Essential hypertension  I10   5. Hypertensive retinopathy of both eyes  H35.033   6. Lattice degeneration of left retina  H35.412   7. Posterior vitreous detachment of right eye  H43.811   8. Combined forms of age-related cataract of both eyes  H25.813     1-3. Exudative age related macular degeneration vs CSCR OD    -  FA (03.02.19) without leakage / pooling into area of SRF  - review of systems reveals significant stressors in life, but no exogenous steroid use  -  discussed treatment with anti-VEGF vs po Eplerenone  - S/P IVA #1 OD (09.27.19),  #2 (10.25.19), #3 (11.22.19) -- minimal response  - pt approved for [employer] and IVE  - S/P IVE OD #1 (12.20.19), #2 (01.20.20), #3 (02.18.20), #4 (07.20.20) #5 (09.01.20), #6 (10.13.20), #7 (11.20.20), #8 (01.20.21), #9 (02.17.21), #10 (03.19.21), #11 (04.20.21), #12 (05.25.21), #13 (6.23.21), #14 (07.27.21), #15 (08.30.21)  - lost to f/u from Feb 2020 to July 2020 due to COVID-19 concerns  - today, VA stable at 20/40-2  - OCT today with interval improvement in SRF -- just trace sliver remains  - discussed possible addition of po eplerenone (50 mg daily) again today -- will hold off due to improvement in SRF today  - recommend IVE OD #16 today, 09.28.21  - pt wishes to proceed with IVE OD  - RBA of procedure discussed, questions answered  - informed consent obtained   - Eylea informed consent form signed and scanned on 12.22.2020  - see procedure note  - [email or web address] benefits investigation initiated -- approved for 2021  - f/u 4 weeks -- DFE/OCT/ possible injection; consider changing medication or addition of eplerenone  4,5. Hypertensive retinopathy OU  - discussed importance of tight BP control  - stable  - monitor  6. Lattice degeneration OS-  - patch of inferior lattice OS -- no RT, holes or SRF  - asymptomatic  - discussed findings, prognosis, and treatment options including observation  - monitor  7. PVD / vitreous syneresis OD-   - Discussed findings and prognosis  - No RT or RD on 360 peripheral exam  - Reviewed s/s of RT/RD  - Strict return precautions for any such RT/RD signs/symptoms  8. Combined form age-related cataract OU-   - The symptoms of cataract, surgical options, and treatments and risks were discussed with patient.  - discussed diagnosis and progression  - not yet visually significant  - monitor for now  Ophthalmic Meds Ordered this visit:  Meds ordered this encounter   Medications  . aflibercept (EYLEA) SOLN 2 mg       Return in about 4 weeks (around 06/23/2020) for f/u exu ARMD OD, DFE, OCT.  There are no Patient Instructions on file for this visit.  This document serves as a record of services personally performed by Gardiner Sleeper, MD, PhD. It was created on their behalf by Estill Bakes, COT an ophthalmic technician. The creation of this record is the provider's dictation and/or activities during the visit.    Electronically signed by: Estill Bakes, COT 9.24.21 @ 2:14 PM   This document serves as a record of services personally performed by Gardiner Sleeper, MD, PhD. It was created on their behalf by San Jetty. Owens Shark, OA an ophthalmic technician. The creation of this record is the provider's dictation and/or activities during the visit.    Electronically signed by: San Jetty. Owens Shark, [place] 09.28.2021 2:14 PM  Gardiner Sleeper, M.D., Ph.D. Diseases & Surgery of the Retina and [place] 05/26/2020   I have reviewed the above documentation for accuracy and completeness, and I agree with the above. Gardiner Sleeper, M.D., Ph.D. 05/26/20 2:14 PM   Abbreviations: M myopia (nearsighted); A astigmatism; H hyperopia (farsighted); P presbyopia; Mrx spectacle prescription;  CTL contact lenses; OD right eye; OS left eye;  OU both eyes  XT exotropia; ET esotropia; PEK punctate epithelial keratitis; PEE punctate epithelial erosions; DES dry eye syndrome; MGD meibomian gland dysfunction; ATs artificial tears; PFAT's preservative free artificial tears; [place] nuclear sclerotic cataract; PSC posterior subcapsular cataract; ERM epi-retinal membrane; PVD posterior vitreous detachment; RD retinal detachment; DM diabetes mellitus; DR diabetic retinopathy; NPDR non-proliferative diabetic retinopathy; PDR proliferative diabetic retinopathy; CSME clinically significant macular edema; DME diabetic macular edema; dbh dot blot hemorrhages; CWS cotton  wool spot; POAG primary open angle glaucoma; C/D cup-to-disc ratio; HVF humphrey visual field; GVF goldmann visual field; OCT optical coherence tomography; IOP intraocular pressure; BRVO Branch retinal vein occlusion; CRVO central retinal vein occlusion; CRAO central retinal artery occlusion; BRAO branch retinal artery occlusion; RT retinal tear; SB scleral buckle; PPV pars plana vitrectomy; VH Vitreous hemorrhage; PRP panretinal laser photocoagulation; IVK intravitreal kenalog; VMT vitreomacular traction; MH Macular hole;  NVD neovascularization of the disc; NVE neovascularization elsewhere; AREDS age related eye disease study; ARMD age related macular degeneration; POAG primary open angle glaucoma; EBMD epithelial/anterior basement membrane dystrophy; ACIOL anterior chamber intraocular lens; IOL intraocular lens; PCIOL posterior chamber intraocular lens; Phaco/IOL phacoemulsification with intraocular lens placement; [place] photorefractive keratectomy; LASIK laser assisted in situ keratomileusis; HTN hypertension; DM diabetes mellitus; COPD chronic obstructive pulmonary disease

## 2020-05-26 ENCOUNTER — Encounter (INDEPENDENT_AMBULATORY_CARE_PROVIDER_SITE_OTHER): Payer: Self-pay | Admitting: Ophthalmology

## 2020-05-26 ENCOUNTER — Other Ambulatory Visit: Payer: Self-pay

## 2020-05-26 ENCOUNTER — Ambulatory Visit (INDEPENDENT_AMBULATORY_CARE_PROVIDER_SITE_OTHER): Payer: Medicare HMO | Admitting: Ophthalmology

## 2020-05-26 DIAGNOSIS — H35711 Central serous chorioretinopathy, right eye: Secondary | ICD-10-CM | POA: Diagnosis not present

## 2020-05-26 DIAGNOSIS — I10 Essential (primary) hypertension: Secondary | ICD-10-CM

## 2020-05-26 DIAGNOSIS — H35412 Lattice degeneration of retina, left eye: Secondary | ICD-10-CM | POA: Diagnosis not present

## 2020-05-26 DIAGNOSIS — H35033 Hypertensive retinopathy, bilateral: Secondary | ICD-10-CM | POA: Diagnosis not present

## 2020-05-26 DIAGNOSIS — H353211 Exudative age-related macular degeneration, right eye, with active choroidal neovascularization: Secondary | ICD-10-CM | POA: Diagnosis not present

## 2020-05-26 DIAGNOSIS — H3581 Retinal edema: Secondary | ICD-10-CM | POA: Diagnosis not present

## 2020-05-26 DIAGNOSIS — H43811 Vitreous degeneration, right eye: Secondary | ICD-10-CM

## 2020-05-26 DIAGNOSIS — H25813 Combined forms of age-related cataract, bilateral: Secondary | ICD-10-CM

## 2020-05-26 MED ORDER — AFLIBERCEPT 2MG/0.05ML IZ SOLN FOR KALEIDOSCOPE
2.0000 mg | INTRAVITREAL | Status: AC | PRN
  Administered 2020-05-26: 2 mg via INTRAVITREAL

## 2020-06-02 DIAGNOSIS — Z79899 Other long term (current) drug therapy: Secondary | ICD-10-CM | POA: Diagnosis not present

## 2020-06-02 DIAGNOSIS — E2839 Other primary ovarian failure: Secondary | ICD-10-CM | POA: Diagnosis not present

## 2020-06-02 DIAGNOSIS — R69 Illness, unspecified: Secondary | ICD-10-CM | POA: Diagnosis not present

## 2020-06-02 DIAGNOSIS — M859 Disorder of bone density and structure, unspecified: Secondary | ICD-10-CM | POA: Diagnosis not present

## 2020-06-19 NOTE — Progress Notes (Signed)
[facility] Note  06/23/2020      CHIEF COMPLAINT Patient presents for Retina Follow Up   HISTORY OF PRESENT ILLNESS: Darlene Crawford is a 79 y.o. female who presents to the clinic today for:   HPI    Retina Follow Up    Patient presents with  Wet AMD.  In right eye.  This started months ago.  Severity is moderate.  Duration of 4 weeks.  Since onset it is stable.  I, the attending physician,  performed the HPI with the patient and updated documentation appropriately.          Comments    79 y/o female pt here for 4 wk f/u for exu ARMD OD.  No change in [place] OU.  Denies pain, FOL, floaters.  Rewetting gtts prn OU.       Last edited by Bernarda Caffey, MD on 06/23/2020  5:01 PM. (History)      Referring physician: Leticia Clas, [address] [address] [place],  [place] [address]  HISTORICAL INFORMATION:   Selected notes from the MEDICAL RECORD NUMBER Referred by Dr. Leander Rams for concern of ARMD OD with progression to CNVM vs CSR   CURRENT MEDICATIONS: Current Outpatient Medications (Ophthalmic Drugs)  Medication Sig  . ARTIFICIAL TEAR SOLUTION OP Place 1 drop into both eyes daily as needed (dry eyes).   Current Facility-Administered Medications (Ophthalmic Drugs)  Medication Route  . aflibercept (EYLEA) SOLN 2 mg Intravitreal  . aflibercept (EYLEA) SOLN 2 mg Intravitreal  . aflibercept (EYLEA) SOLN 2 mg Intravitreal   Current Outpatient Medications (Other)  Medication Sig  . acidophilus (RISAQUAD) CAPS capsule Take 1 capsule by mouth daily.  Marland Kitchen atorvastatin (LIPITOR) 10 MG tablet Take 10 mg by mouth at bedtime.   . Cholecalciferol (VITAMIN D) 50 MCG (2000 UT) tablet Take 4,000 Units by mouth at bedtime.   . Cyanocobalamin (B-12 PO) Take 1 Dose by mouth 2 (two) times a week.  . fluticasone (FLONASE) 50 MCG/ACT nasal spray Place 1 spray into both nostrils daily as needed for allergies or rhinitis.  . hydrochlorothiazide (HYDRODIURIL) 25 MG  tablet Take 25 mg by mouth daily. 1/2 tablet am, 1/2 tablet pm  . levothyroxine (SYNTHROID, LEVOTHROID) 100 MCG tablet Take 100 mcg by mouth daily before breakfast.  . loratadine (CLARITIN) 10 MG tablet Take 10 mg by mouth daily as needed for allergies.   Marland Kitchen losartan (COZAAR) 100 MG tablet Take 100 mg by mouth daily.   . Melatonin 10 MG CAPS Take 10 mg by mouth at bedtime.  . Multiple Vitamins-Minerals (PRESERVISION AREDS 2 PO) Take 1 tablet by mouth at bedtime.   . sertraline (ZOLOFT) 50 MG tablet Take 50 mg by mouth daily.  . sodium chloride (OCEAN) 0.65 % SOLN nasal spray Place 1 spray into both nostrils as needed for congestion.   Current Facility-Administered Medications (Other)  Medication Route  . Bevacizumab (AVASTIN) SOLN 1.25 mg Intravitreal  . Bevacizumab (AVASTIN) SOLN 1.25 mg Intravitreal  . Bevacizumab (AVASTIN) SOLN 1.25 mg Intravitreal      REVIEW OF SYSTEMS: ROS    Positive for: Musculoskeletal, Eyes   Negative for: Constitutional, Gastrointestinal, Neurological, Skin, Genitourinary, HENT, Endocrine, Cardiovascular, Respiratory, Psychiatric, Allergic/Imm, Heme/Lymph   Last edited by Matthew Folks, COA on 06/23/2020  2:21 PM. (History)       ALLERGIES Allergies  Allergen Reactions  . Trilyte [Peg 3350-Kcl-Na Bicarb-Nacl]     HEMATEMESIS  . Vicodin [  Hydrocodone-Acetaminophen] Nausea And Vomiting    PAST MEDICAL HISTORY Past Medical History:  Diagnosis Date  . Anxiety and depression   . Cataract    Combined forms OU  . Hypercholesterolemia   . Hypertension   . Hypertensive retinopathy    OU  . Hypothyroidism   . Macular degeneration    OD  . Osteoarthritis   . Seasonal allergies    Past Surgical History:  Procedure Laterality Date  . BIOPSY  10/01/2018   Procedure: BIOPSY;  Surgeon: Danie Binder, MD;  [facility];  Service: Endoscopy;;  gastric bx & gastric polyp  . COLONOSCOPY N/A 10/01/2018   Procedure: COLONOSCOPY;  Surgeon:  Danie Binder, MD;  Location: [facility];  Service: Endoscopy;  Laterality: N/A;  10:30am  . ESOPHAGOGASTRODUODENOSCOPY N/A 10/01/2018   Procedure: ESOPHAGOGASTRODUODENOSCOPY (EGD);  Surgeon: Danie Binder, MD;  Location: [facility];  Service: Endoscopy;  Laterality: N/A;  . POLYPECTOMY  10/01/2018   Procedure: POLYPECTOMY;  Surgeon: Danie Binder, MD;  Location: [facility];  Service: Endoscopy;;  colon   . TUBAL LIGATION    . UTERINE FIBROID SURGERY      FAMILY HISTORY Family History  Problem Relation Age of Onset  . Cancer Mother        lung  . Stroke Father   . Stroke Brother   . Colon cancer Neg Hx     SOCIAL HISTORY Social History   Tobacco Use  . Smoking status: Former Smoker    Types: Cigarettes  . Smokeless tobacco: Never Used  Vaping Use  . Vaping Use: Never used  Substance Use Topics  . Alcohol use: No  . Drug use: No         OPHTHALMIC EXAM:  Base Eye Exam    Visual Acuity (Snellen - Linear)      Right Left   Dist cc 20/40 + 20/25   Dist ph cc 20/30 -2 NI   Correction: Glasses       Tonometry (Tonopen, 2:22 PM)      Right Left   Pressure 10 10       Pupils      Dark Light Shape React APD   Right 3 2 Round Brisk None   Left 3 2 Round Brisk None       Visual Fields (Counting fingers)      Left Right    Full Full       Extraocular Movement      Right Left    Full, Ortho Full, Ortho       Neuro/Psych    Oriented x3: Yes   Mood/Affect: Normal       Dilation    Right eye: 1.0% Mydriacyl, 2.5% Phenylephrine @ 2:22 PM  Pt defers dil OS        Slit Lamp and Fundus Exam    Slit Lamp Exam      Right Left   Lids/Lashes Dermatochalasis - upper lid Dermatochalasis - upper lid   Conjunctiva/Sclera White and quiet White and quiet   Cornea Arcus, early nasal band K, trace PEE Arcus, early nasal band K, 1+PEE   Anterior Chamber moderate depth, with narrow angle  moderate depth, with narrow angle temporally   Iris Round and  well dilated Round and well dilated   Lens 2-3+ Nuclear sclerosis, 2-3+ Cortical cataract 2+ Nuclear sclerosis, 2+ Cortical cataract   Vitreous Vitreous syneresis, Posterior vitreous detachment, Weiss ring Vitreous syneresis  Fundus Exam      Right Left   Disc Pink and Sharp, Compact Pink and Sharp, Tilted disc   C/D Ratio 0.3 0.4   Macula blunted foveal reflex; central SRF - improved, drusen; +RPE mottling and clumping, no heme Flat, good foveal reflex, Retinal pigment epithelial mottling, superior para-foveal focal area of RPE atrophy -- stable, rare drusen, No heme or edema   Vessels Vascular attenuation, Tortuous Vascular attenuation, Tortuous   Periphery Attached, peripheral drusen / RPE changes nasally, peripheral cystoid degeneration Attached, mild patch of Lattice degeneration at 0600, scattered peripheral drusen, peripheral cystoid degeneration          IMAGING AND PROCEDURES  Imaging and Procedures for 12/11/17  OCT, Retina - OU - Both Eyes       Right Eye Quality was good. Central Foveal Thickness: 250. Progression has improved. Findings include no IRF, retinal drusen , pigment epithelial detachment, subretinal hyper-reflective material, normal foveal contour, outer retinal atrophy, no SRF (Interval improvement in SRF ).   Left Eye Quality was good. Central Foveal Thickness: 276. Progression has been stable. Findings include normal foveal contour, no IRF, no SRF, retinal drusen .   Notes *Images captured and stored on drive  Diagnosis / Impression:  OD: Interval improvement in SRF -- resolved today OS: NFP, No IRF/SRF  Clinical management:  See below  Abbreviations: NFP - Normal foveal profile. CME - cystoid macular edema. PED - pigment epithelial detachment. IRF - intraretinal fluid. SRF - subretinal fluid. EZ - ellipsoid zone. ERM - epiretinal membrane. ORA - outer retinal atrophy. ORT - outer retinal tubulation. SRHM - subretinal hyper-reflective  material         Intravitreal Injection, Pharmacologic Agent - OD - Right Eye       Time Out 06/23/2020. 3:26 PM. Confirmed correct patient, procedure, site, and patient consented.   Anesthesia Topical anesthesia was used. Anesthetic medications included Lidocaine 2%, Proparacaine 0.5%.   Procedure Preparation included 5% betadine to ocular surface, eyelid speculum.   Injection:  2 mg aflibercept Alfonse Flavors) SOLN   NDC: A3590391, Lot: [id number], Expiration date: 08/29/2020   Route: Intravitreal, Site: Right Eye, Waste: 0.05 mL  Post-op Post injection exam found visual acuity of at least counting fingers. The patient tolerated the procedure well. There were no complications. The patient received written and verbal post procedure care education.                 ASSESSMENT/PLAN:    ICD-10-CM   1. Exudative age-related macular degeneration of right eye with active choroidal neovascularization (HCC)  H35.3211 Intravitreal Injection, Pharmacologic Agent - OD - Right Eye    aflibercept (EYLEA) SOLN 2 mg  2. Central serous chorioretinopathy of right eye  H35.711   3. Retinal edema  H35.81 OCT, Retina - OU - Both Eyes  4. Essential hypertension  I10   5. Hypertensive retinopathy of both eyes  H35.033   6. Lattice degeneration of left retina  H35.412   7. Posterior vitreous detachment of right eye  H43.811   8. Combined forms of age-related cataract of both eyes  H25.813     1-3. Exudative age related macular degeneration vs CSCR OD    - FA (03.02.19) without leakage / pooling into area of SRF  - review of systems reveals significant stressors in life, but no exogenous steroid use  - discussed treatment with anti-VEGF vs po Eplerenone  - S/P IVA #1 OD (09.27.19),  #2 (10.25.19), #3 (11.22.19) -- minimal response  -  pt approved for [employer] and IVE  - S/P IVE OD #1 (12.20.19), #2 (01.20.20), #3 (02.18.20), #4 (07.20.20) #5 (09.01.20), #6 (10.13.20), #7 (11.20.20), #8  (01.20.21), #9 (02.17.21), #10 (03.19.21), #11 (04.20.21), #12 (05.25.21), #13 (6.23.21), #14 (07.27.21), #15 (08.30.21), #16 (9.28.21)  - lost to f/u from Feb 2020 to July 2020 due to COVID-19 concerns  - today, VA improved to 20/30 from 20/40-2  - OCT today with interval improvement in SRF -- interval resolution of fluid  - recommend IVE OD #17 today, 10.26.21, maintenance w/ f/u in 4-5 wks  - pt wishes to proceed with IVE OD  - RBA of procedure discussed, questions answered  - informed consent obtained   - Eylea informed consent form signed and scanned on 12.22.2020  - see procedure note  - [email or web address] benefits investigation initiated -- approved for 2021  - f/u 4-5 weeks -- DFE/OCT/ possible injection  4,5. Hypertensive retinopathy OU  - discussed importance of tight BP control  - stable  - monitor  6. Lattice degeneration OS-  - patch of inferior lattice OS -- no RT, holes or SRF  - asymptomatic  - discussed findings, prognosis, and treatment options including observation  - monitor  7. PVD / vitreous syneresis OD-   - Discussed findings and prognosis  - No RT or RD on 360 peripheral exam  - Reviewed s/s of RT/RD  - Strict return precautions for any such RT/RD signs/symptoms  8. Combined form age-related cataract OU-   - The symptoms of cataract, surgical options, and treatments and risks were discussed with patient.  - discussed diagnosis and progression  - not yet visually significant  - monitor for now  Ophthalmic Meds Ordered this visit:  Meds ordered this encounter  Medications  . aflibercept (EYLEA) SOLN 2 mg       Return for 4-5 weeks DFE, OCT.  There are no Patient Instructions on file for this visit.   This document serves as a record of services personally performed by Gardiner Sleeper, MD, PhD. It was created on their behalf by Roselee Nova, COMT. The creation of this record is the provider's dictation and/or activities during the visit.  Electronically  signed by: Roselee Nova, COMT 06/23/20 5:03 PM  This document serves as a record of services personally performed by Gardiner Sleeper, MD, PhD. It was created on their behalf by Estill Bakes, COT an ophthalmic technician. The creation of this record is the provider's dictation and/or activities during the visit.    Electronically signed by: Estill Bakes, [place] 10.22.21 @ 5:03 PM  Gardiner Sleeper, M.D., Ph.D. Diseases & Surgery of the Retina and Keys 06/23/2020   I have reviewed the above documentation for accuracy and completeness, and I agree with the above. Gardiner Sleeper, M.D., Ph.D. 06/23/20 5:03 PM   Abbreviations: M myopia (nearsighted); A astigmatism; H hyperopia (farsighted); P presbyopia; Mrx spectacle prescription;  CTL contact lenses; OD right eye; OS left eye; OU both eyes  XT exotropia; ET esotropia; PEK punctate epithelial keratitis; PEE punctate epithelial erosions; DES dry eye syndrome; MGD meibomian gland dysfunction; ATs artificial tears; PFAT's preservative free artificial tears; [place] nuclear sclerotic cataract; PSC posterior subcapsular cataract; ERM epi-retinal membrane; PVD posterior vitreous detachment; RD retinal detachment; DM diabetes mellitus; DR diabetic retinopathy; NPDR non-proliferative diabetic retinopathy; PDR proliferative diabetic retinopathy; CSME clinically significant macular edema; DME diabetic macular edema; dbh dot blot hemorrhages; CWS cotton wool spot; POAG primary open angle glaucoma;  C/D cup-to-disc ratio; HVF humphrey visual field; GVF goldmann visual field; OCT optical coherence tomography; IOP intraocular pressure; BRVO Branch retinal vein occlusion; CRVO central retinal vein occlusion; CRAO central retinal artery occlusion; BRAO branch retinal artery occlusion; RT retinal tear; SB scleral buckle; PPV pars plana vitrectomy; VH Vitreous hemorrhage; PRP panretinal laser photocoagulation; IVK intravitreal kenalog; VMT  vitreomacular traction; MH Macular hole;  NVD neovascularization of the disc; NVE neovascularization elsewhere; AREDS age related eye disease study; ARMD age related macular degeneration; POAG primary open angle glaucoma; EBMD epithelial/anterior basement membrane dystrophy; ACIOL anterior chamber intraocular lens; IOL intraocular lens; PCIOL posterior chamber intraocular lens; Phaco/IOL phacoemulsification with intraocular lens placement; [place] photorefractive keratectomy; LASIK laser assisted in situ keratomileusis; HTN hypertension; DM diabetes mellitus; COPD chronic obstructive pulmonary disease

## 2020-06-23 ENCOUNTER — Encounter (INDEPENDENT_AMBULATORY_CARE_PROVIDER_SITE_OTHER): Payer: Self-pay | Admitting: Ophthalmology

## 2020-06-23 ENCOUNTER — Other Ambulatory Visit: Payer: Self-pay

## 2020-06-23 ENCOUNTER — Ambulatory Visit (INDEPENDENT_AMBULATORY_CARE_PROVIDER_SITE_OTHER): Payer: Medicare HMO | Admitting: Ophthalmology

## 2020-06-23 DIAGNOSIS — H35412 Lattice degeneration of retina, left eye: Secondary | ICD-10-CM

## 2020-06-23 DIAGNOSIS — H43811 Vitreous degeneration, right eye: Secondary | ICD-10-CM

## 2020-06-23 DIAGNOSIS — H35711 Central serous chorioretinopathy, right eye: Secondary | ICD-10-CM

## 2020-06-23 DIAGNOSIS — I10 Essential (primary) hypertension: Secondary | ICD-10-CM | POA: Diagnosis not present

## 2020-06-23 DIAGNOSIS — H25813 Combined forms of age-related cataract, bilateral: Secondary | ICD-10-CM

## 2020-06-23 DIAGNOSIS — H353211 Exudative age-related macular degeneration, right eye, with active choroidal neovascularization: Secondary | ICD-10-CM | POA: Diagnosis not present

## 2020-06-23 DIAGNOSIS — H3581 Retinal edema: Secondary | ICD-10-CM

## 2020-06-23 DIAGNOSIS — H35033 Hypertensive retinopathy, bilateral: Secondary | ICD-10-CM

## 2020-06-23 MED ORDER — AFLIBERCEPT 2MG/0.05ML IZ SOLN FOR KALEIDOSCOPE
2.0000 mg | INTRAVITREAL | Status: AC | PRN
  Administered 2020-06-23: 2 mg via INTRAVITREAL

## 2020-07-07 DIAGNOSIS — I10 Essential (primary) hypertension: Secondary | ICD-10-CM | POA: Diagnosis not present

## 2020-07-07 DIAGNOSIS — Z299 Encounter for prophylactic measures, unspecified: Secondary | ICD-10-CM | POA: Diagnosis not present

## 2020-07-07 DIAGNOSIS — N183 Chronic kidney disease, stage 3 unspecified: Secondary | ICD-10-CM | POA: Diagnosis not present

## 2020-07-07 DIAGNOSIS — E039 Hypothyroidism, unspecified: Secondary | ICD-10-CM | POA: Diagnosis not present

## 2020-07-16 NOTE — Progress Notes (Signed)
[facility] Note  07/27/2020      CHIEF COMPLAINT Patient presents for Retina Follow Up   HISTORY OF PRESENT ILLNESS: Darlene Crawford is a 79 y.o. female who presents to the clinic today for:   HPI    Retina Follow Up    Patient presents with  Wet AMD.  In right eye.  This started months ago.  Severity is moderate.  Duration of 4.5 weeks.  Since onset it is stable.  I, the attending physician,  performed the HPI with the patient and updated documentation appropriately.          Comments    79 y/o female pt here for 4.5 wk f/u for exu ARMD OD.  No change in [place] OU.  Denies pain, FOL.  Has a few small intermittent floaters OU.  AT prn OU to relieve watering eyes.       Last edited by Bernarda Caffey, MD on 07/27/2020  1:26 PM. (History)      Referring physician: Berenice Primas [place],  [place] [id number]  HISTORICAL INFORMATION:   Selected notes from the MEDICAL RECORD NUMBER Referred by Dr. Leander Rams for concern of ARMD OD with progression to CNVM vs CSR   CURRENT MEDICATIONS: Current Outpatient Medications (Ophthalmic Drugs)  Medication Sig  . ARTIFICIAL TEAR SOLUTION OP Place 1 drop into both eyes daily as needed (dry eyes).   Current Facility-Administered Medications (Ophthalmic Drugs)  Medication Route  . aflibercept (EYLEA) SOLN 2 mg Intravitreal  . aflibercept (EYLEA) SOLN 2 mg Intravitreal  . aflibercept (EYLEA) SOLN 2 mg Intravitreal   Current Outpatient Medications (Other)  Medication Sig  . acidophilus (RISAQUAD) CAPS capsule Take 1 capsule by mouth daily.  Marland Kitchen atorvastatin (LIPITOR) 10 MG tablet Take 10 mg by mouth at bedtime.   . Cholecalciferol (VITAMIN D) 50 MCG (2000 UT) tablet Take 4,000 Units by mouth at bedtime.   . Cyanocobalamin (B-12 PO) Take 1 Dose by mouth 2 (two) times a week.  . fluticasone (FLONASE) 50 MCG/ACT nasal spray Place 1 spray into both nostrils daily as needed for allergies or rhinitis.  .  hydrochlorothiazide (HYDRODIURIL) 25 MG tablet Take 25 mg by mouth daily. 1/2 tablet am, 1/2 tablet pm  . levothyroxine (SYNTHROID, LEVOTHROID) 100 MCG tablet Take 100 mcg by mouth daily before breakfast.  . loratadine (CLARITIN) 10 MG tablet Take 10 mg by mouth daily as needed for allergies.   Marland Kitchen losartan (COZAAR) 100 MG tablet Take 100 mg by mouth daily.   . Melatonin 10 MG CAPS Take 10 mg by mouth at bedtime.  . Multiple Vitamins-Minerals (PRESERVISION AREDS 2 PO) Take 1 tablet by mouth at bedtime.   . sertraline (ZOLOFT) 50 MG tablet Take 50 mg by mouth daily.  . sodium chloride (OCEAN) 0.65 % SOLN nasal spray Place 1 spray into both nostrils as needed for congestion.   Current Facility-Administered Medications (Other)  Medication Route  . Bevacizumab (AVASTIN) SOLN 1.25 mg Intravitreal  . Bevacizumab (AVASTIN) SOLN 1.25 mg Intravitreal  . Bevacizumab (AVASTIN) SOLN 1.25 mg Intravitreal      REVIEW OF SYSTEMS: ROS    Positive for: Musculoskeletal, Eyes   Negative for: Constitutional, Gastrointestinal, Neurological, Skin, Genitourinary, HENT, Endocrine, Cardiovascular, Respiratory, Psychiatric, Allergic/Imm, Heme/Lymph   Last edited by Matthew Folks, COA on 07/27/2020  1:08 PM. (History)       ALLERGIES Allergies  Allergen Reactions  . Trilyte [Peg 3350-Kcl-Na Bicarb-Nacl]  HEMATEMESIS  . Vicodin [Hydrocodone-Acetaminophen] Nausea And Vomiting    PAST MEDICAL HISTORY Past Medical History:  Diagnosis Date  . Anxiety and depression   . Cataract    Combined forms OU  . Hypercholesterolemia   . Hypertension   . Hypertensive retinopathy    OU  . Hypothyroidism   . Macular degeneration    OD  . Osteoarthritis   . Seasonal allergies    Past Surgical History:  Procedure Laterality Date  . BIOPSY  10/01/2018   Procedure: BIOPSY;  Surgeon: Danie Binder, MD;  [facility];  Service: Endoscopy;;  gastric bx & gastric polyp  . COLONOSCOPY N/A 10/01/2018    Procedure: COLONOSCOPY;  Surgeon: Danie Binder, MD;  Location: [facility];  Service: Endoscopy;  Laterality: N/A;  10:30am  . ESOPHAGOGASTRODUODENOSCOPY N/A 10/01/2018   Procedure: ESOPHAGOGASTRODUODENOSCOPY (EGD);  Surgeon: Danie Binder, MD;  Location: [facility];  Service: Endoscopy;  Laterality: N/A;  . POLYPECTOMY  10/01/2018   Procedure: POLYPECTOMY;  Surgeon: Danie Binder, MD;  Location: [facility];  Service: Endoscopy;;  colon   . TUBAL LIGATION    . UTERINE FIBROID SURGERY      FAMILY HISTORY Family History  Problem Relation Age of Onset  . Cancer Mother        lung  . Stroke Father   . Stroke Brother   . Colon cancer Neg Hx     SOCIAL HISTORY Social History   Tobacco Use  . Smoking status: Former Smoker    Types: Cigarettes  . Smokeless tobacco: Never Used  Vaping Use  . Vaping Use: Never used  Substance Use Topics  . Alcohol use: No  . Drug use: No         OPHTHALMIC EXAM:  Base Eye Exam    Visual Acuity (Snellen - Linear)      Right Left   Dist cc 20/40 - 20/20 -2   Dist ph cc 20/30 -2    Correction: Glasses       Tonometry (Tonopen, 1:12 PM)      Right Left   Pressure 9 8       Pupils      Dark Light Shape React APD   Right 3 2 Round Brisk None   Left 3 2 Round Brisk None       Visual Fields (Counting fingers)      Left Right    Full Full       Extraocular Movement      Right Left    Full, Ortho Full, Ortho       Neuro/Psych    Oriented x3: Yes   Mood/Affect: Normal       Dilation    Right eye: 1.0% Mydriacyl, 2.5% Phenylephrine @ 1:12 PM  Pt defers dil OS        Slit Lamp and Fundus Exam    Slit Lamp Exam      Right Left   Lids/Lashes Dermatochalasis - upper lid Dermatochalasis - upper lid   Conjunctiva/Sclera White and quiet White and quiet   Cornea Arcus, early nasal band K, trace PEE Arcus, early nasal band K, 1+PEE   Anterior Chamber moderate depth, with narrow angle  moderate depth, with narrow angle  temporally   Iris Round and well dilated Round and well dilated   Lens 2-3+ Nuclear sclerosis, 2-3+ Cortical cataract 2+ Nuclear sclerosis, 2+ Cortical cataract   Vitreous Vitreous syneresis, Posterior vitreous detachment, Mariel Kansky  ring Vitreous syneresis       Fundus Exam      Right Left   Disc Pink and Sharp, Compact Pink and Sharp, Tilted disc   C/D Ratio 0.3 0.4   Macula blunted foveal reflex; central SRF - stably improved; +drusen; +RPE mottling and clumping, no heme Flat, good foveal reflex, Retinal pigment epithelial mottling, superior para-foveal focal area of RPE atrophy -- stable, rare drusen, No heme or edema   Vessels Vascular attenuation, Tortuous Vascular attenuation, Tortuous   Periphery Attached, peripheral drusen / RPE changes nasally, peripheral cystoid degeneration Attached, mild patch of Lattice degeneration at 0600, scattered peripheral drusen, peripheral cystoid degeneration          IMAGING AND PROCEDURES  Imaging and Procedures for 12/11/17  OCT, Retina - OU - Both Eyes       Right Eye Quality was good. Central Foveal Thickness: 253. Progression has been stable. Findings include no IRF, retinal drusen , pigment epithelial detachment, subretinal hyper-reflective material, normal foveal contour, outer retinal atrophy, no SRF (Stable improvement in SRF; mild vitelliform like lesion centrally).   Left Eye Quality was good. Central Foveal Thickness: 277. Progression has been stable. Findings include normal foveal contour, no IRF, no SRF, retinal drusen .   Notes *Images captured and stored on drive  Diagnosis / Impression:  OD: Stable improvement in SRF; mild vitelliform like lesion centrally OS: NFP, No IRF/SRF  Clinical management:  See below  Abbreviations: NFP - Normal foveal profile. CME - cystoid macular edema. PED - pigment epithelial detachment. IRF - intraretinal fluid. SRF - subretinal fluid. EZ - ellipsoid zone. ERM - epiretinal membrane. ORA -  outer retinal atrophy. ORT - outer retinal tubulation. SRHM - subretinal hyper-reflective material         Intravitreal Injection, Pharmacologic Agent - OD - Right Eye       Time Out 07/27/2020. 1:26 PM. Confirmed correct patient, procedure, site, and patient consented.   Anesthesia Topical anesthesia was used. Anesthetic medications included Lidocaine 2%, Proparacaine 0.5%.   Procedure Preparation included 5% betadine to ocular surface, eyelid speculum.   Injection:  2 mg aflibercept Alfonse Flavors) SOLN   NDC: A3590391, Lot: [id number], Expiration date: 10/26/2020   Route: Intravitreal, Site: Right Eye, Waste: 0.05 mL  Post-op Post injection exam found visual acuity of at least counting fingers. The patient tolerated the procedure well. There were no complications. The patient received written and verbal post procedure care education.                 ASSESSMENT/PLAN:    ICD-10-CM   1. Exudative age-related macular degeneration of right eye with active choroidal neovascularization (HCC)  H35.3211 Intravitreal Injection, Pharmacologic Agent - OD - Right Eye    aflibercept (EYLEA) SOLN 2 mg  2. Central serous chorioretinopathy of right eye  H35.711   3. Retinal edema  H35.81 OCT, Retina - OU - Both Eyes  4. Essential hypertension  I10   5. Lattice degeneration of left retina  H35.412   6. Hypertensive retinopathy of both eyes  H35.033   7. Posterior vitreous detachment of right eye  H43.811   8. Combined forms of age-related cataract of both eyes  H25.813     1-3. Exudative age related macular degeneration vs CSCR OD    - FA (03.02.19) without leakage / pooling into area of SRF  - review of systems reveals significant stressors in life, but no exogenous steroid use  - discussed treatment with anti-VEGF vs po  Eplerenone  - S/P IVA #1 OD (09.27.19),  #2 (10.25.19), #3 (11.22.19) -- minimal response  - pt approved for [employer] and IVE  - S/P IVE OD #1 (12.20.19), #2  (01.20.20), #3 (02.18.20), #4 (07.20.20) #5 (09.01.20), #6 (10.13.20), #7 (11.20.20), #8 (01.20.21), #9 (02.17.21), #10 (03.19.21), #11 (04.20.21), #12 (05.25.21), #13 (6.23.21), #14 (07.27.21), #15 (08.30.21), #16 (9.28.21), #17 (10.26.21)  - lost to f/u from Feb 2020 to July 2020 due to COVID-19 concerns  - today, VA stable at 20/30   - OCT today with stable improvement in SRF -- stable resolution of fluid  - recommend IVE OD #18 today, 11.29.21, maintenance w/ f/u in 5 wks  - pt wishes to proceed with IVE OD  - RBA of procedure discussed, questions answered  - informed consent obtained   - Eylea informed consent form signed and scanned on 12.22.2020  - see procedure note  - [email or web address] benefits investigation initiated -- approved for 2021  - f/u 5 weeks -- DFE/OCT/ possible injection  4,5. Hypertensive retinopathy OU  - discussed importance of tight BP control  - stable  - monitor  6. Lattice degeneration OS-  - patch of inferior lattice OS -- no RT, holes or SRF  - asymptomatic  - discussed findings, prognosis, and treatment options including observation  - monitor  7. PVD / vitreous syneresis OD-   - Discussed findings and prognosis  - No RT or RD on 360 peripheral exam  - Reviewed s/s of RT/RD  - Strict return precautions for any such RT/RD signs/symptoms  8. Combined form age-related cataract OU-   - The symptoms of cataract, surgical options, and treatments and risks were discussed with patient.  - discussed diagnosis and progression  - not yet visually significant  - monitor for now  Ophthalmic Meds Ordered this visit:  Meds ordered this encounter  Medications  . aflibercept (EYLEA) SOLN 2 mg       Return in about 5 weeks (around 08/31/2020) for f/u exu ARMD OD, DFE, OCT.  There are no Patient Instructions on file for this visit.   This document serves as a record of services personally performed by Gardiner Sleeper, MD, PhD. It was created on their behalf by Leeann Must, [place], an ophthalmic technician. The creation of this record is the provider's dictation and/or activities during the visit.    Electronically signed by: Leeann Must, [place] 11.18.2021 1:49 PM   This document serves as a record of services personally performed by Gardiner Sleeper, MD, PhD. It was created on their behalf by San Jetty. Owens Shark, OA an ophthalmic technician. The creation of this record is the provider's dictation and/or activities during the visit.    Electronically signed by: San Jetty. Owens Shark, [place] 11.29.2021 1:49 PM  Gardiner Sleeper, M.D., Ph.D. Diseases & Surgery of the Retina and [place] 07/27/2020   I have reviewed the above documentation for accuracy and completeness, and I agree with the above. Gardiner Sleeper, M.D., Ph.D. 07/27/20 1:50 PM   Abbreviations: M myopia (nearsighted); A astigmatism; H hyperopia (farsighted); P presbyopia; Mrx spectacle prescription;  CTL contact lenses; OD right eye; OS left eye; OU both eyes  XT exotropia; ET esotropia; PEK punctate epithelial keratitis; PEE punctate epithelial erosions; DES dry eye syndrome; MGD meibomian gland dysfunction; ATs artificial tears; PFAT's preservative free artificial tears; [place] nuclear sclerotic cataract; PSC posterior subcapsular cataract; ERM epi-retinal membrane; PVD posterior vitreous detachment; RD retinal detachment; DM diabetes  mellitus; DR diabetic retinopathy; NPDR non-proliferative diabetic retinopathy; PDR proliferative diabetic retinopathy; CSME clinically significant macular edema; DME diabetic macular edema; dbh dot blot hemorrhages; CWS cotton wool spot; POAG primary open angle glaucoma; C/D cup-to-disc ratio; HVF humphrey visual field; GVF goldmann visual field; OCT optical coherence tomography; IOP intraocular pressure; BRVO Branch retinal vein occlusion; CRVO central retinal vein occlusion; CRAO central retinal artery occlusion; BRAO branch retinal artery occlusion;  RT retinal tear; SB scleral buckle; PPV pars plana vitrectomy; VH Vitreous hemorrhage; PRP panretinal laser photocoagulation; IVK intravitreal kenalog; VMT vitreomacular traction; MH Macular hole;  NVD neovascularization of the disc; NVE neovascularization elsewhere; AREDS age related eye disease study; ARMD age related macular degeneration; POAG primary open angle glaucoma; EBMD epithelial/anterior basement membrane dystrophy; ACIOL anterior chamber intraocular lens; IOL intraocular lens; PCIOL posterior chamber intraocular lens; Phaco/IOL phacoemulsification with intraocular lens placement; [place] photorefractive keratectomy; LASIK laser assisted in situ keratomileusis; HTN hypertension; DM diabetes mellitus; COPD chronic obstructive pulmonary disease

## 2020-07-27 ENCOUNTER — Other Ambulatory Visit: Payer: Self-pay

## 2020-07-27 ENCOUNTER — Ambulatory Visit (INDEPENDENT_AMBULATORY_CARE_PROVIDER_SITE_OTHER): Payer: Medicare HMO | Admitting: Ophthalmology

## 2020-07-27 ENCOUNTER — Encounter (INDEPENDENT_AMBULATORY_CARE_PROVIDER_SITE_OTHER): Payer: Self-pay | Admitting: Ophthalmology

## 2020-07-27 DIAGNOSIS — H353211 Exudative age-related macular degeneration, right eye, with active choroidal neovascularization: Secondary | ICD-10-CM

## 2020-07-27 DIAGNOSIS — H43811 Vitreous degeneration, right eye: Secondary | ICD-10-CM

## 2020-07-27 DIAGNOSIS — H35412 Lattice degeneration of retina, left eye: Secondary | ICD-10-CM | POA: Diagnosis not present

## 2020-07-27 DIAGNOSIS — H25813 Combined forms of age-related cataract, bilateral: Secondary | ICD-10-CM | POA: Diagnosis not present

## 2020-07-27 DIAGNOSIS — H35033 Hypertensive retinopathy, bilateral: Secondary | ICD-10-CM | POA: Diagnosis not present

## 2020-07-27 DIAGNOSIS — H35711 Central serous chorioretinopathy, right eye: Secondary | ICD-10-CM | POA: Diagnosis not present

## 2020-07-27 DIAGNOSIS — I10 Essential (primary) hypertension: Secondary | ICD-10-CM | POA: Diagnosis not present

## 2020-07-27 DIAGNOSIS — H3581 Retinal edema: Secondary | ICD-10-CM | POA: Diagnosis not present

## 2020-07-27 MED ORDER — AFLIBERCEPT 2MG/0.05ML IZ SOLN FOR KALEIDOSCOPE
2.0000 mg | INTRAVITREAL | Status: AC | PRN
  Administered 2020-07-27: 2 mg via INTRAVITREAL

## 2020-08-25 DIAGNOSIS — R21 Rash and other nonspecific skin eruption: Secondary | ICD-10-CM | POA: Diagnosis not present

## 2020-08-25 DIAGNOSIS — I10 Essential (primary) hypertension: Secondary | ICD-10-CM | POA: Diagnosis not present

## 2020-08-25 DIAGNOSIS — Z299 Encounter for prophylactic measures, unspecified: Secondary | ICD-10-CM | POA: Diagnosis not present

## 2020-08-25 DIAGNOSIS — R69 Illness, unspecified: Secondary | ICD-10-CM | POA: Diagnosis not present

## 2020-08-25 DIAGNOSIS — M1990 Unspecified osteoarthritis, unspecified site: Secondary | ICD-10-CM | POA: Diagnosis not present

## 2020-08-28 DIAGNOSIS — E039 Hypothyroidism, unspecified: Secondary | ICD-10-CM | POA: Diagnosis not present

## 2020-08-28 DIAGNOSIS — I10 Essential (primary) hypertension: Secondary | ICD-10-CM | POA: Diagnosis not present

## 2020-08-28 DIAGNOSIS — M8580 Other specified disorders of bone density and structure, unspecified site: Secondary | ICD-10-CM | POA: Diagnosis not present

## 2020-08-28 DIAGNOSIS — E7849 Other hyperlipidemia: Secondary | ICD-10-CM | POA: Diagnosis not present

## 2020-08-31 ENCOUNTER — Encounter (INDEPENDENT_AMBULATORY_CARE_PROVIDER_SITE_OTHER): Payer: Medicare HMO | Admitting: Ophthalmology

## 2020-09-04 NOTE — Progress Notes (Signed)
[facility] - Clinic Note  09/08/2020      CHIEF COMPLAINT Patient presents for Retina Follow Up   HISTORY OF PRESENT ILLNESS: Darlene Crawford is a 80 y.o. female who presents to the clinic today for:   HPI    Retina Follow Up    Patient presents with  Wet AMD.  In right eye.  This started 6 weeks ago.  I, the attending physician,  performed the HPI with the patient and updated documentation appropriately.          Comments    Patient here for 6 weeks retina follow up for exu ARMD OD. Patient states vision noticed that the dark circle has come back.  Wasn't there last week, no eye pain. Only wanted OD dilated.       Last edited by Rennis Chris, MD on 09/09/2020 11:34 PM. (History)      Referring physician: Wendall Papa [address] [place],  [place] [address]  HISTORICAL INFORMATION:   Selected notes from the MEDICAL RECORD NUMBER Referred by Dr. Monico Blitz for concern of ARMD OD with progression to CNVM vs CSR   CURRENT MEDICATIONS: Current Outpatient Medications (Ophthalmic Drugs)  Medication Sig  . ARTIFICIAL TEAR SOLUTION OP Place 1 drop into both eyes daily as needed (dry eyes).   Current Facility-Administered Medications (Ophthalmic Drugs)  Medication Route  . aflibercept (EYLEA) SOLN 2 mg Intravitreal  . aflibercept (EYLEA) SOLN 2 mg Intravitreal  . aflibercept (EYLEA) SOLN 2 mg Intravitreal   Current Outpatient Medications (Other)  Medication Sig  . acidophilus (RISAQUAD) CAPS capsule Take 1 capsule by mouth daily.  Marland Kitchen atorvastatin (LIPITOR) 10 MG tablet Take 10 mg by mouth at bedtime.   . Cholecalciferol (VITAMIN D) 50 MCG (2000 UT) tablet Take 4,000 Units by mouth at bedtime.   . Cyanocobalamin (B-12 PO) Take 1 Dose by mouth 2 (two) times a week.  . fluticasone (FLONASE) 50 MCG/ACT nasal spray Place 1 spray into both nostrils daily as needed for allergies or rhinitis.  . hydrochlorothiazide (HYDRODIURIL) 25 MG tablet Take 25 mg by  mouth daily. 1/2 tablet am, 1/2 tablet pm  . levothyroxine (SYNTHROID, LEVOTHROID) 100 MCG tablet Take 100 mcg by mouth daily before breakfast.  . loratadine (CLARITIN) 10 MG tablet Take 10 mg by mouth daily as needed for allergies.   Marland Kitchen losartan (COZAAR) 100 MG tablet Take 100 mg by mouth daily.   . Melatonin 10 MG CAPS Take 10 mg by mouth at bedtime.  . Multiple Vitamins-Minerals (PRESERVISION AREDS 2 PO) Take 1 tablet by mouth at bedtime.   . sertraline (ZOLOFT) 50 MG tablet Take 50 mg by mouth daily.  . sodium chloride (OCEAN) 0.65 % SOLN nasal spray Place 1 spray into both nostrils as needed for congestion.   Current Facility-Administered Medications (Other)  Medication Route  . Bevacizumab (AVASTIN) SOLN 1.25 mg Intravitreal  . Bevacizumab (AVASTIN) SOLN 1.25 mg Intravitreal  . Bevacizumab (AVASTIN) SOLN 1.25 mg Intravitreal      REVIEW OF SYSTEMS: ROS    Positive for: Genitourinary, Musculoskeletal, Endocrine, Eyes, Psychiatric, Allergic/Imm   Negative for: Constitutional, Gastrointestinal, Neurological, Skin, HENT, Cardiovascular, Respiratory, Heme/Lymph   Last edited by Laddie Aquas, COA on 09/08/2020  2:02 PM. (History)       ALLERGIES Allergies  Allergen Reactions  . Trilyte [Peg 3350-Kcl-Na Bicarb-Nacl]     HEMATEMESIS  . Vicodin [Hydrocodone-Acetaminophen] Nausea And Vomiting    PAST MEDICAL HISTORY Past Medical History:  Diagnosis Date  . Anxiety and depression   . Cataract    Combined forms OU  . Hypercholesterolemia   . Hypertension   . Hypertensive retinopathy    OU  . Hypothyroidism   . Macular degeneration    OD  . Osteoarthritis   . Seasonal allergies    Past Surgical History:  Procedure Laterality Date  . BIOPSY  10/01/2018   Procedure: BIOPSY;  Surgeon: West Bali, MD;  Location: [facility];  Service: Endoscopy;;  gastric bx & gastric polyp  . COLONOSCOPY N/A 10/01/2018   Procedure: COLONOSCOPY;  Surgeon: West Bali, MD;   Location: [facility];  Service: Endoscopy;  Laterality: N/A;  10:30am  . ESOPHAGOGASTRODUODENOSCOPY N/A 10/01/2018   Procedure: ESOPHAGOGASTRODUODENOSCOPY (EGD);  Surgeon: West Bali, MD;  Location: [facility];  Service: Endoscopy;  Laterality: N/A;  . POLYPECTOMY  10/01/2018   Procedure: POLYPECTOMY;  Surgeon: West Bali, MD;  Location: [facility];  Service: Endoscopy;;  colon   . TUBAL LIGATION    . UTERINE FIBROID SURGERY      FAMILY HISTORY Family History  Problem Relation Age of Onset  . Cancer Mother        lung  . Stroke Father   . Stroke Brother   . Colon cancer Neg Hx     SOCIAL HISTORY Social History   Tobacco Use  . Smoking status: Former Smoker    Types: Cigarettes  . Smokeless tobacco: Never Used  Vaping Use  . Vaping Use: Never used  Substance Use Topics  . Alcohol use: No  . Drug use: No         OPHTHALMIC EXAM:  Base Eye Exam    Visual Acuity (Snellen - Linear)      Right Left   Dist cc 20/40 -1 20/20   Dist ph cc NI    Correction: Glasses       Tonometry (Tonopen, 1:59 PM)      Right Left   Pressure 12 13       Pupils      Dark Light Shape React APD   Right 3 2 Round Brisk None   Left 3 2 Round Brisk None       Visual Fields (Counting fingers)      Left Right    Full Full       Extraocular Movement      Right Left    Full, Ortho Full, Ortho       Neuro/Psych    Oriented x3: Yes   Mood/Affect: Normal  Patient only wanted OD dilated.       Dilation    Right eye: 1.0% Mydriacyl @ 1:58 PM        Slit Lamp and Fundus Exam    Slit Lamp Exam      Right Left   Lids/Lashes Dermatochalasis - upper lid Dermatochalasis - upper lid   Conjunctiva/Sclera White and quiet White and quiet   Cornea Arcus, early nasal band K, trace PEE Arcus, early nasal band K, 1+PEE   Anterior Chamber moderate depth, with narrow angle  moderate depth, with narrow angle temporally   Iris Round and well dilated Round and well dilated    Lens 2-3+ Nuclear sclerosis, 2-3+ Cortical cataract 2+ Nuclear sclerosis, 2+ Cortical cataract   Vitreous Vitreous syneresis, Posterior vitreous detachment, Weiss ring Vitreous syneresis       Fundus Exam      Right Left   Disc  Pink and Sharp, Compact Pink and Sharp, Tilted disc   C/D Ratio 0.3 0.4   Macula blunted foveal reflex; central SRF - recurrent; +drusen; +RPE mottling and clumping, no heme Flat, good foveal reflex, Retinal pigment epithelial mottling, superior para-foveal focal area of RPE atrophy -- stable, rare drusen, No heme or edema   Vessels Vascular attenuation, Tortuous Vascular attenuation, Tortuous   Periphery Attached, peripheral drusen / RPE changes nasally, peripheral cystoid degeneration Attached, mild patch of Lattice degeneration at 0600, scattered peripheral drusen, peripheral cystoid degeneration        Refraction    Wearing Rx      Sphere Cylinder Axis Add   Right +2.00 +1.25 158 +2.50   Left +2.00 +0.75 002 +2.50          IMAGING AND PROCEDURES  Imaging and Procedures for 12/11/17  OCT, Retina - OU - Both Eyes       Right Eye Quality was good. Central Foveal Thickness: 311. Progression has worsened. Findings include no IRF, retinal drusen , pigment epithelial detachment, subretinal hyper-reflective material, normal foveal contour, outer retinal atrophy, subretinal fluid (Interval re-development of SRF).   Left Eye Quality was good. Central Foveal Thickness: 277. Progression has been stable. Findings include normal foveal contour, no IRF, no SRF, retinal drusen .   Notes *Images captured and stored on drive  Diagnosis / Impression:  OD: Interval re-development of SRF OS: NFP, No IRF/SRF  Clinical management:  See below  Abbreviations: NFP - Normal foveal profile. CME - cystoid macular edema. PED - pigment epithelial detachment. IRF - intraretinal fluid. SRF - subretinal fluid. EZ - ellipsoid zone. ERM - epiretinal membrane. ORA - outer  retinal atrophy. ORT - outer retinal tubulation. SRHM - subretinal hyper-reflective material         Intravitreal Injection, Pharmacologic Agent - OD - Right Eye       Time Out 09/08/2020. 3:19 PM. Confirmed correct patient, procedure, site, and patient consented.   Anesthesia Topical anesthesia was used. Anesthetic medications included Lidocaine 2%, Proparacaine 0.5%.   Procedure Preparation included 5% betadine to ocular surface, eyelid speculum. A (32g) needle was used.   Injection:  2 mg aflibercept Gretta Cool) SOLN   NDC: L6038910, Lot: [id number], Expiration date: 11/27/2020   Route: Intravitreal, Site: Right Eye, Waste: 0.05 mL  Post-op Post injection exam found visual acuity of at least counting fingers. The patient tolerated the procedure well. There were no complications. The patient received written and verbal post procedure care education.                 ASSESSMENT/PLAN:    ICD-10-CM   1. Exudative age-related macular degeneration of right eye with active choroidal neovascularization (HCC)  H35.3211 Intravitreal Injection, Pharmacologic Agent - OD - Right Eye    aflibercept (EYLEA) SOLN 2 mg  2. Central serous chorioretinopathy of right eye  H35.711   3. Retinal edema  H35.81 OCT, Retina - OU - Both Eyes  4. Essential hypertension  I10   5. Hypertensive retinopathy of both eyes  H35.033   6. Lattice degeneration of left retina  H35.412   7. Posterior vitreous detachment of right eye  H43.811   8. Combined forms of age-related cataract of both eyes  H25.813    1-3. Exudative age related macular degeneration vs CSCR OD  - delayed f/u to 6 wks from 5 due to snow    - FA (03.02.19) without leakage / pooling into area of SRF  - review of  systems reveals significant stressors in life, but no exogenous steroid use  - discussed treatment with anti-VEGF vs po Eplerenone  - S/P IVA #1 OD (09.27.19),  #2 (10.25.19), #3 (11.22.19) -- minimal response  - pt approved  for [employer] and IVE  - S/P IVE OD #1 (12.20.19), #2 (01.20.20), #3 (02.18.20), #4 (07.20.20) #5 (09.01.20), #6 (10.13.20), #7 (11.20.20), #8 (01.20.21), #9 (02.17.21), #10 (03.19.21), #11 (04.20.21), #12 (05.25.21), #13 (6.23.21), #14 (07.27.21), #15 (08.30.21), #16 (9.28.21), #17 (10.26.21), #18 (11.29.21)  - lost to f/u from Feb 2020 to July 2020 due to COVID-19 concerns  - today, VA down to 20/40 from 20/30 OD  - OCT today shows interval re-development of SRF.  - recommend IVE OD #19 today, 1.11.22, w/ f/u in 4-5 wks  - pt wishes to proceed with IVE OD  - RBA of procedure discussed, questions answered  - informed consent obtained   - Eylea informed consent form signed and scanned on 12.22.2020  - see procedure note  - [email or web address] benefits investigation initiated -- approved for 2021  - f/u 4-5 weeks -- DFE/OCT/ possible injection  4,5. Hypertensive retinopathy OU  - discussed importance of tight BP control  - stable  - monitor  6. Lattice degeneration OS-  - patch of inferior lattice OS -- no RT, holes or SRF  - asymptomatic  - discussed findings, prognosis, and treatment options including observation  - monitor  7. PVD / vitreous syneresis OD-   - Discussed findings and prognosis  - No RT or RD on 360 peripheral exam  - Reviewed s/s of RT/RD  - Strict return precautions for any such RT/RD signs/symptoms  8. Combined form age-related cataract OU-   - The symptoms of cataract, surgical options, and treatments and risks were discussed with patient.  - discussed diagnosis and progression  - not yet visually significant  - monitor for now  Ophthalmic Meds Ordered this visit:  Meds ordered this encounter  Medications  . aflibercept (EYLEA) SOLN 2 mg      Return for 4-5 wk f/u for exu ARMD/CSR OD w/DFE/OCT/likely inj..  There are no Patient Instructions on file for this visit.   This document serves as a record of services personally performed by Karie Chimera, MD, PhD.  It was created on their behalf by Cristopher Estimable, COT an ophthalmic technician. The creation of this record is the provider's dictation and/or activities during the visit.    Electronically signed by: Cristopher Estimable, COT 1.11.22 @ 11:41 PM  Karie Chimera, M.D., Ph.D. Diseases & Surgery of the Retina and Vitreous [facility] 09/08/2020   I have reviewed the above documentation for accuracy and completeness, and I agree with the above. Karie Chimera, M.D., Ph.D. 09/09/20 11:41 PM   Abbreviations: M myopia (nearsighted); A astigmatism; H hyperopia (farsighted); P presbyopia; Mrx spectacle prescription;  CTL contact lenses; OD right eye; OS left eye; OU both eyes  XT exotropia; ET esotropia; PEK punctate epithelial keratitis; PEE punctate epithelial erosions; DES dry eye syndrome; MGD meibomian gland dysfunction; ATs artificial tears; PFAT's preservative free artificial tears; NSC nuclear sclerotic cataract; PSC posterior subcapsular cataract; ERM epi-retinal membrane; PVD posterior vitreous detachment; RD retinal detachment; DM diabetes mellitus; DR diabetic retinopathy; NPDR non-proliferative diabetic retinopathy; PDR proliferative diabetic retinopathy; CSME clinically significant macular edema; DME diabetic macular edema; dbh dot blot hemorrhages; CWS cotton wool spot; POAG primary open angle glaucoma; C/D cup-to-disc ratio; HVF humphrey visual field; GVF goldmann visual field;  OCT optical coherence tomography; IOP intraocular pressure; BRVO Branch retinal vein occlusion; CRVO central retinal vein occlusion; CRAO central retinal artery occlusion; BRAO branch retinal artery occlusion; RT retinal tear; SB scleral buckle; PPV pars plana vitrectomy; VH Vitreous hemorrhage; PRP panretinal laser photocoagulation; IVK intravitreal kenalog; VMT vitreomacular traction; MH Macular hole;  NVD neovascularization of the disc; NVE neovascularization elsewhere; AREDS age related eye disease  study; ARMD age related macular degeneration; POAG primary open angle glaucoma; EBMD epithelial/anterior basement membrane dystrophy; ACIOL anterior chamber intraocular lens; IOL intraocular lens; PCIOL posterior chamber intraocular lens; Phaco/IOL phacoemulsification with intraocular lens placement; PRK photorefractive keratectomy; LASIK laser assisted in situ keratomileusis; HTN hypertension; DM diabetes mellitus; COPD chronic obstructive pulmonary disease

## 2020-09-08 ENCOUNTER — Other Ambulatory Visit: Payer: Self-pay

## 2020-09-08 ENCOUNTER — Ambulatory Visit (INDEPENDENT_AMBULATORY_CARE_PROVIDER_SITE_OTHER): Payer: Medicare HMO | Admitting: Ophthalmology

## 2020-09-08 ENCOUNTER — Encounter (INDEPENDENT_AMBULATORY_CARE_PROVIDER_SITE_OTHER): Payer: Self-pay | Admitting: Ophthalmology

## 2020-09-08 DIAGNOSIS — H3581 Retinal edema: Secondary | ICD-10-CM

## 2020-09-08 DIAGNOSIS — H43811 Vitreous degeneration, right eye: Secondary | ICD-10-CM | POA: Diagnosis not present

## 2020-09-08 DIAGNOSIS — I10 Essential (primary) hypertension: Secondary | ICD-10-CM | POA: Diagnosis not present

## 2020-09-08 DIAGNOSIS — H25813 Combined forms of age-related cataract, bilateral: Secondary | ICD-10-CM | POA: Diagnosis not present

## 2020-09-08 DIAGNOSIS — H35033 Hypertensive retinopathy, bilateral: Secondary | ICD-10-CM

## 2020-09-08 DIAGNOSIS — H35711 Central serous chorioretinopathy, right eye: Secondary | ICD-10-CM | POA: Diagnosis not present

## 2020-09-08 DIAGNOSIS — H353211 Exudative age-related macular degeneration, right eye, with active choroidal neovascularization: Secondary | ICD-10-CM

## 2020-09-08 DIAGNOSIS — H35412 Lattice degeneration of retina, left eye: Secondary | ICD-10-CM

## 2020-09-09 ENCOUNTER — Encounter (INDEPENDENT_AMBULATORY_CARE_PROVIDER_SITE_OTHER): Payer: Self-pay | Admitting: Ophthalmology

## 2020-09-09 DIAGNOSIS — I10 Essential (primary) hypertension: Secondary | ICD-10-CM | POA: Diagnosis not present

## 2020-09-09 DIAGNOSIS — H353211 Exudative age-related macular degeneration, right eye, with active choroidal neovascularization: Secondary | ICD-10-CM

## 2020-09-09 DIAGNOSIS — H35033 Hypertensive retinopathy, bilateral: Secondary | ICD-10-CM | POA: Diagnosis not present

## 2020-09-09 DIAGNOSIS — H3581 Retinal edema: Secondary | ICD-10-CM | POA: Diagnosis not present

## 2020-09-09 DIAGNOSIS — H35711 Central serous chorioretinopathy, right eye: Secondary | ICD-10-CM | POA: Diagnosis not present

## 2020-09-09 DIAGNOSIS — H35412 Lattice degeneration of retina, left eye: Secondary | ICD-10-CM | POA: Diagnosis not present

## 2020-09-09 DIAGNOSIS — H25813 Combined forms of age-related cataract, bilateral: Secondary | ICD-10-CM | POA: Diagnosis not present

## 2020-09-09 DIAGNOSIS — H43811 Vitreous degeneration, right eye: Secondary | ICD-10-CM | POA: Diagnosis not present

## 2020-09-09 MED ORDER — AFLIBERCEPT 2MG/0.05ML IZ SOLN FOR KALEIDOSCOPE
2.0000 mg | INTRAVITREAL | Status: AC | PRN
  Administered 2020-09-09: 2 mg via INTRAVITREAL

## 2020-10-01 NOTE — Progress Notes (Signed)
[facility] Note  10/06/2020      CHIEF COMPLAINT Patient presents for Retina Follow Up   HISTORY OF PRESENT ILLNESS: Darlene Crawford is a 80 y.o. female who presents to the clinic today for:   HPI    Retina Follow Up    Patient presents with  Wet AMD.  In right eye.  This started weeks ago.  Severity is moderate.  Duration of weeks.  Since onset it is stable.  I, the attending physician,  performed the HPI with the patient and updated documentation appropriately.          Comments    Pt states vision is the same.  Pt denies eye pain or discomfort and denies any new or worsening floaters or fol OU.       Last edited by Bernarda Caffey, MD on 10/06/2020  1:42 PM. (History)    pt states no change in vision, but the "cloudiness" is gone  Referring physician: Leticia Clas, [address],  [place] [address]  HISTORICAL INFORMATION:   Selected notes from the MEDICAL RECORD NUMBER Referred by Dr. Leander Rams for concern of ARMD OD with progression to CNVM vs CSR   CURRENT MEDICATIONS: Current Outpatient Medications (Ophthalmic Drugs)  Medication Sig  . ARTIFICIAL TEAR SOLUTION OP Place 1 drop into both eyes daily as needed (dry eyes).   Current Facility-Administered Medications (Ophthalmic Drugs)  Medication Route  . aflibercept (EYLEA) SOLN 2 mg Intravitreal  . aflibercept (EYLEA) SOLN 2 mg Intravitreal  . aflibercept (EYLEA) SOLN 2 mg Intravitreal   Current Outpatient Medications (Other)  Medication Sig  . acidophilus (RISAQUAD) CAPS capsule Take 1 capsule by mouth daily.  Marland Kitchen atorvastatin (LIPITOR) 10 MG tablet Take 10 mg by mouth at bedtime.   . Cholecalciferol (VITAMIN D) 50 MCG (2000 UT) tablet Take 4,000 Units by mouth at bedtime.   . Cyanocobalamin (B-12 PO) Take 1 Dose by mouth 2 (two) times a week.  . fluticasone (FLONASE) 50 MCG/ACT nasal spray Place 1 spray into both nostrils daily as needed for allergies or rhinitis.  .  hydrochlorothiazide (HYDRODIURIL) 25 MG tablet Take 25 mg by mouth daily. 1/2 tablet am, 1/2 tablet pm  . levothyroxine (SYNTHROID, LEVOTHROID) 100 MCG tablet Take 100 mcg by mouth daily before breakfast.  . loratadine (CLARITIN) 10 MG tablet Take 10 mg by mouth daily as needed for allergies.   Marland Kitchen losartan (COZAAR) 100 MG tablet Take 100 mg by mouth daily.   . Melatonin 10 MG CAPS Take 10 mg by mouth at bedtime.  . Multiple Vitamins-Minerals (PRESERVISION AREDS 2 PO) Take 1 tablet by mouth at bedtime.   . sertraline (ZOLOFT) 50 MG tablet Take 50 mg by mouth daily.  . sodium chloride (OCEAN) 0.65 % SOLN nasal spray Place 1 spray into both nostrils as needed for congestion.   Current Facility-Administered Medications (Other)  Medication Route  . Bevacizumab (AVASTIN) SOLN 1.25 mg Intravitreal  . Bevacizumab (AVASTIN) SOLN 1.25 mg Intravitreal  . Bevacizumab (AVASTIN) SOLN 1.25 mg Intravitreal      REVIEW OF SYSTEMS: ROS    Positive for: Genitourinary, Musculoskeletal, Endocrine, Eyes, Psychiatric, Allergic/Imm   Negative for: Constitutional, Gastrointestinal, Neurological, Skin, HENT, Cardiovascular, Respiratory, Heme/Lymph   Last edited by Doneen Poisson on 10/06/2020  1:24 PM. (History)       ALLERGIES Allergies  Allergen Reactions  . Trilyte [Peg 3350-Kcl-Na Bicarb-Nacl]     HEMATEMESIS  .  Vicodin [Hydrocodone-Acetaminophen] Nausea And Vomiting    PAST MEDICAL HISTORY Past Medical History:  Diagnosis Date  . Anxiety and depression   . Cataract    Combined forms OU  . Hypercholesterolemia   . Hypertension   . Hypertensive retinopathy    OU  . Hypothyroidism   . Macular degeneration    OD  . Osteoarthritis   . Seasonal allergies    Past Surgical History:  Procedure Laterality Date  . BIOPSY  10/01/2018   Procedure: BIOPSY;  Surgeon: Danie Binder, MD;  [facility];  Service: Endoscopy;;  gastric bx & gastric polyp  . COLONOSCOPY N/A 10/01/2018    Procedure: COLONOSCOPY;  Surgeon: Danie Binder, MD;  [facility]: [facility];  Service: Endoscopy;  Laterality: N/A;  10:30am  . ESOPHAGOGASTRODUODENOSCOPY N/A 10/01/2018   Procedure: ESOPHAGOGASTRODUODENOSCOPY (EGD);  Surgeon: Danie Binder, MD;  [facility]: [facility];  Service: Endoscopy;  Laterality: N/A;  . POLYPECTOMY  10/01/2018   Procedure: POLYPECTOMY;  Surgeon: Danie Binder, MD;  Location: [facility];  Service: Endoscopy;;  colon   . TUBAL LIGATION    . UTERINE FIBROID SURGERY      FAMILY HISTORY Family History  Problem Relation Age of Onset  . Cancer Mother        lung  . Stroke Father   . Stroke Brother   . Colon cancer Neg Hx     SOCIAL HISTORY Social History   Tobacco Use  . Smoking status: Former Smoker    Types: Cigarettes  . Smokeless tobacco: Never Used  Vaping Use  . Vaping Use: Never used  Substance Use Topics  . Alcohol use: No  . Drug use: No         OPHTHALMIC EXAM:  Base Eye Exam    Visual Acuity (Snellen - Linear)      Right Left   Dist cc 20/40 +1 20/20 -2   Dist ph cc NI        Tonometry (Tonopen, 1:27 PM)      Right Left   Pressure 11 11       Pupils      Dark Light Shape React APD   Right 3 2 Round Minimal 0   Left 3 2 Round Minimal 0       Visual Fields      Left Right    Full Full       Extraocular Movement      Right Left    Full Full       Neuro/Psych    Oriented x3: Yes   Mood/Affect: Normal       Dilation    Right eye: 1.0% Mydriacyl, 2.5% Phenylephrine @ 1:27 PM  Pt refuses dilation OS        Slit Lamp and Fundus Exam    Slit Lamp Exam      Right Left   Lids/Lashes Dermatochalasis - upper lid Dermatochalasis - upper lid   Conjunctiva/Sclera White and quiet White and quiet   Cornea Arcus, early nasal band K, trace PEE Arcus, early nasal band K, 1+PEE   Anterior Chamber moderate depth, with narrow angle  moderate depth, with narrow angle temporally   Iris Round and well dilated Round and  well dilated   Lens 2-3+ Nuclear sclerosis, 2-3+ Cortical cataract 2+ Nuclear sclerosis, 2+ Cortical cataract   Vitreous Vitreous syneresis, Posterior vitreous detachment, Weiss ring Vitreous syneresis       Fundus Exam  Right Left   Disc Pink and Sharp, Compact Pink and Sharp, Tilted disc   C/D Ratio 0.3 0.4   Macula blunted foveal reflex; central SRF - resolvedt; +drusen; +RPE mottling and clumping, no heme Flat, good foveal reflex, Retinal pigment epithelial mottling, superior para-foveal focal area of RPE atrophy -- stable, rare drusen, No heme or edema   Vessels Vascular attenuation, Tortuous Vascular attenuation, Tortuous   Periphery Attached, peripheral drusen / RPE changes nasally, peripheral cystoid degeneration Attached, mild patch of Lattice degeneration at 0600, scattered peripheral drusen, peripheral cystoid degeneration        Refraction    Wearing Rx      Sphere Cylinder Axis Add   Right +2.00 +1.25 158 +2.50   Left +2.00 +0.75 002 +2.50          IMAGING AND PROCEDURES  Imaging and Procedures for 12/11/17  OCT, Retina - OU - Both Eyes       Right Eye Quality was good. Central Foveal Thickness: 251. Progression has improved. Findings include no IRF, retinal drusen , pigment epithelial detachment, subretinal hyper-reflective material, normal foveal contour, outer retinal atrophy, subretinal fluid (Interval resolution of SRF).   Left Eye Quality was good. Central Foveal Thickness: 277. Progression has been stable. Findings include normal foveal contour, no IRF, no SRF, retinal drusen .   Notes *Images captured and stored on drive  Diagnosis / Impression:  OD: Interval resolution of SRF OS: NFP, No IRF/SRF  Clinical management:  See below  Abbreviations: NFP - Normal foveal profile. CME - cystoid macular edema. PED - pigment epithelial detachment. IRF - intraretinal fluid. SRF - subretinal fluid. EZ - ellipsoid zone. ERM - epiretinal membrane. ORA -  outer retinal atrophy. ORT - outer retinal tubulation. SRHM - subretinal hyper-reflective material         Intravitreal Injection, Pharmacologic Agent - OD - Right Eye       Time Out 10/06/2020. 1:39 PM. Confirmed correct patient, procedure, site, and patient consented.   Anesthesia Topical anesthesia was used. Anesthetic medications included Lidocaine 2%, Proparacaine 0.5%.   Procedure Preparation included 5% betadine to ocular surface, eyelid speculum. A (32g) needle was used.   Injection:  2 mg aflibercept Alfonse Flavors) SOLN   NDC: A3590391, Lot: [id number], Expiration date: 01/26/2021   Route: Intravitreal, Site: Right Eye, Waste: 0.05 mL  Post-op Post injection exam found visual acuity of at least counting fingers. The patient tolerated the procedure well. There were no complications. The patient received written and verbal post procedure care education. Post injection medications were not given.                 ASSESSMENT/PLAN:    ICD-10-CM   1. Exudative age-related macular degeneration of right eye with active choroidal neovascularization (HCC)  H35.3211 Intravitreal Injection, Pharmacologic Agent - OD - Right Eye    aflibercept (EYLEA) SOLN 2 mg  2. Central serous chorioretinopathy of right eye  H35.711   3. Retinal edema  H35.81 OCT, Retina - OU - Both Eyes  4. Essential hypertension  I10   5. Hypertensive retinopathy of both eyes  H35.033   6. Lattice degeneration of left retina  H35.412   7. Posterior vitreous detachment of right eye  H43.811   8. Combined forms of age-related cataract of both eyes  H25.813    1-3. Exudative age related macular degeneration vs CSCR OD  - History of worsening fluid when delayed f/u to 6 wks from 5 due to snow    -  FA (03.02.19) without leakage / pooling into area of SRF  - review of systems reveals significant stressors in life, but no exogenous steroid use  - discussed treatment with anti-VEGF vs po Eplerenone  - S/P IVA #1  OD (09.27.19),  #2 (10.25.19), #3 (11.22.19) -- minimal response  - pt approved for [employer] and IVE  - S/P IVE OD #1 (12.20.19), #2 (01.20.20), #3 (02.18.20), #4 (07.20.20) #5 (09.01.20), #6 (10.13.20), #7 (11.20.20), #8 (01.20.21), #9 (02.17.21), #10 (03.19.21), #11 (04.20.21), #12 (05.25.21), #13 (6.23.21), #14 (07.27.21), #15 (08.30.21), #16 (9.28.21), #17 (10.26.21), #18 (11.29.21), #19 (01.11.22)  - lost to f/u from Feb 2020 to July 2020 due to COVID-19 concerns  - today, VA stable at 20/40 OD  - OCT today shows interval resolution of SRF  - recommend IVE OD #20 today, 2.8.22, w/ f/u in 4-5 wks  - pt wishes to proceed with IVE OD  - RBA of procedure discussed, questions answered  - informed consent obtained   - Eylea informed consent form signed and scanned on 12.22.2020  - see procedure note  - [email or web address] benefits investigation initiated -- approved for 2022  - f/u 4-5 weeks -- DFE/OCT/ possible injection  4,5. Hypertensive retinopathy OU  - discussed importance of tight BP control  - stable  - monitor  6. Lattice degeneration OS-  - patch of inferior lattice OS -- no RT, holes or SRF  - asymptomatic  - discussed findings, prognosis, and treatment options including observation  - monitor  7. PVD / vitreous syneresis OD-   - Discussed findings and prognosis  - No RT or RD on 360 peripheral exam  - Reviewed s/s of RT/RD  - Strict return precautions for any such RT/RD signs/symptoms  8. Combined form age-related cataract OU-   - The symptoms of cataract, surgical options, and treatments and risks were discussed with patient.  - discussed diagnosis and progression  - not yet visually significant  - monitor for now  Ophthalmic Meds Ordered this visit:  Meds ordered this encounter  Medications  . aflibercept (EYLEA) SOLN 2 mg      Return for f/u 4-5 weeks, exu ARMD OD, DFE, OCT.  There are no Patient Instructions on file for this visit.   This document serves as a  record of services personally performed by Gardiner Sleeper, MD, PhD. It was created on their behalf by Estill Bakes, COT an ophthalmic technician. The creation of this record is the provider's dictation and/or activities during the visit.    Electronically signed by: Estill Bakes, COT 2.3.22 @ 1:48 PM   This document serves as a record of services personally performed by Gardiner Sleeper, MD, PhD. It was created on their behalf by San Jetty. Owens Shark, OA an ophthalmic technician. The creation of this record is the provider's dictation and/or activities during the visit.    Electronically signed by: San Jetty. Owens Shark, [place] 02.08.2022 1:48 PM   Gardiner Sleeper, M.D., Ph.D. Diseases & Surgery of the Retina and [place] 10/06/2020   I have reviewed the above documentation for accuracy and completeness, and I agree with the above. Gardiner Sleeper, M.D., Ph.D. 10/06/20 1:48 PM   Abbreviations: M myopia (nearsighted); A astigmatism; H hyperopia (farsighted); P presbyopia; Mrx spectacle prescription;  CTL contact lenses; OD right eye; OS left eye; OU both eyes  XT exotropia; ET esotropia; PEK punctate epithelial keratitis; PEE punctate epithelial erosions; DES dry eye syndrome; MGD meibomian gland dysfunction;  ATs artificial tears; PFAT's preservative free artificial tears; [place] nuclear sclerotic cataract; PSC posterior subcapsular cataract; ERM epi-retinal membrane; PVD posterior vitreous detachment; RD retinal detachment; DM diabetes mellitus; DR diabetic retinopathy; NPDR non-proliferative diabetic retinopathy; PDR proliferative diabetic retinopathy; CSME clinically significant macular edema; DME diabetic macular edema; dbh dot blot hemorrhages; CWS cotton wool spot; POAG primary open angle glaucoma; C/D cup-to-disc ratio; HVF humphrey visual field; GVF goldmann visual field; OCT optical coherence tomography; IOP intraocular pressure; BRVO Branch retinal vein occlusion; CRVO  central retinal vein occlusion; CRAO central retinal artery occlusion; BRAO branch retinal artery occlusion; RT retinal tear; SB scleral buckle; PPV pars plana vitrectomy; VH Vitreous hemorrhage; PRP panretinal laser photocoagulation; IVK intravitreal kenalog; VMT vitreomacular traction; MH Macular hole;  NVD neovascularization of the disc; NVE neovascularization elsewhere; AREDS age related eye disease study; ARMD age related macular degeneration; POAG primary open angle glaucoma; EBMD epithelial/anterior basement membrane dystrophy; ACIOL anterior chamber intraocular lens; IOL intraocular lens; PCIOL posterior chamber intraocular lens; Phaco/IOL phacoemulsification with intraocular lens placement; [place] photorefractive keratectomy; LASIK laser assisted in situ keratomileusis; HTN hypertension; DM diabetes mellitus; COPD chronic obstructive pulmonary disease

## 2020-10-06 ENCOUNTER — Encounter (INDEPENDENT_AMBULATORY_CARE_PROVIDER_SITE_OTHER): Payer: Self-pay | Admitting: Ophthalmology

## 2020-10-06 ENCOUNTER — Other Ambulatory Visit: Payer: Self-pay

## 2020-10-06 ENCOUNTER — Ambulatory Visit (INDEPENDENT_AMBULATORY_CARE_PROVIDER_SITE_OTHER): Payer: Medicare HMO | Admitting: Ophthalmology

## 2020-10-06 DIAGNOSIS — I10 Essential (primary) hypertension: Secondary | ICD-10-CM | POA: Diagnosis not present

## 2020-10-06 DIAGNOSIS — H3581 Retinal edema: Secondary | ICD-10-CM

## 2020-10-06 DIAGNOSIS — H25813 Combined forms of age-related cataract, bilateral: Secondary | ICD-10-CM | POA: Diagnosis not present

## 2020-10-06 DIAGNOSIS — H43811 Vitreous degeneration, right eye: Secondary | ICD-10-CM | POA: Diagnosis not present

## 2020-10-06 DIAGNOSIS — H35033 Hypertensive retinopathy, bilateral: Secondary | ICD-10-CM

## 2020-10-06 DIAGNOSIS — H35412 Lattice degeneration of retina, left eye: Secondary | ICD-10-CM | POA: Diagnosis not present

## 2020-10-06 DIAGNOSIS — H353211 Exudative age-related macular degeneration, right eye, with active choroidal neovascularization: Secondary | ICD-10-CM | POA: Diagnosis not present

## 2020-10-06 DIAGNOSIS — H35711 Central serous chorioretinopathy, right eye: Secondary | ICD-10-CM | POA: Diagnosis not present

## 2020-10-06 MED ORDER — AFLIBERCEPT 2MG/0.05ML IZ SOLN FOR KALEIDOSCOPE
2.0000 mg | INTRAVITREAL | Status: AC | PRN
  Administered 2020-10-06: 2 mg via INTRAVITREAL

## 2020-10-07 DIAGNOSIS — Z299 Encounter for prophylactic measures, unspecified: Secondary | ICD-10-CM | POA: Diagnosis not present

## 2020-10-07 DIAGNOSIS — R69 Illness, unspecified: Secondary | ICD-10-CM | POA: Diagnosis not present

## 2020-10-07 DIAGNOSIS — E039 Hypothyroidism, unspecified: Secondary | ICD-10-CM | POA: Diagnosis not present

## 2020-10-07 DIAGNOSIS — I10 Essential (primary) hypertension: Secondary | ICD-10-CM | POA: Diagnosis not present

## 2020-10-07 DIAGNOSIS — N183 Chronic kidney disease, stage 3 unspecified: Secondary | ICD-10-CM | POA: Diagnosis not present

## 2020-10-27 NOTE — Progress Notes (Signed)
[facility] Note  11/03/2020      CHIEF COMPLAINT Patient presents for Retina Follow Up   HISTORY OF PRESENT ILLNESS: Darlene Crawford is a 80 y.o. female who presents to the clinic today for:   HPI    Retina Follow Up    Patient presents with  Wet AMD.  In right eye.  Severity is moderate.  Duration of 4 weeks.  Since onset it is stable.  I, the attending physician,  performed the HPI with the patient and updated documentation appropriately.          Comments    Patient states vision the same OU.        Last edited by Bernarda Caffey, MD on 11/03/2020 11:58 PM. (History)      Referring physician: Leticia Clas, [address] [place],  [place] [address]  HISTORICAL INFORMATION:   Selected notes from the MEDICAL RECORD NUMBER Referred by Dr. Leander Rams for concern of ARMD OD with progression to CNVM vs CSR   CURRENT MEDICATIONS: Current Outpatient Medications (Ophthalmic Drugs)  Medication Sig  . ARTIFICIAL TEAR SOLUTION OP Place 1 drop into both eyes daily as needed (dry eyes).   Current Facility-Administered Medications (Ophthalmic Drugs)  Medication Route  . aflibercept (EYLEA) SOLN 2 mg Intravitreal  . aflibercept (EYLEA) SOLN 2 mg Intravitreal  . aflibercept (EYLEA) SOLN 2 mg Intravitreal   Current Outpatient Medications (Other)  Medication Sig  . acidophilus (RISAQUAD) CAPS capsule Take 1 capsule by mouth daily.  Marland Kitchen atorvastatin (LIPITOR) 10 MG tablet Take 10 mg by mouth at bedtime.   . Cholecalciferol (VITAMIN D) 50 MCG (2000 UT) tablet Take 4,000 Units by mouth at bedtime.   . Cyanocobalamin (B-12 PO) Take 1 Dose by mouth 2 (two) times a week.  . fluticasone (FLONASE) 50 MCG/ACT nasal spray Place 1 spray into both nostrils daily as needed for allergies or rhinitis.  . hydrochlorothiazide (HYDRODIURIL) 25 MG tablet Take 25 mg by mouth daily. 1/2 tablet am, 1/2 tablet pm  . levothyroxine (SYNTHROID, LEVOTHROID) 100 MCG tablet  Take 100 mcg by mouth daily before breakfast.  . loratadine (CLARITIN) 10 MG tablet Take 10 mg by mouth daily as needed for allergies.   Marland Kitchen losartan (COZAAR) 100 MG tablet Take 100 mg by mouth daily.   . Melatonin 10 MG CAPS Take 10 mg by mouth at bedtime.  . Multiple Vitamins-Minerals (PRESERVISION AREDS 2 PO) Take 1 tablet by mouth at bedtime.   . sertraline (ZOLOFT) 50 MG tablet Take 50 mg by mouth daily.  . sodium chloride (OCEAN) 0.65 % SOLN nasal spray Place 1 spray into both nostrils as needed for congestion.   Current Facility-Administered Medications (Other)  Medication Route  . Bevacizumab (AVASTIN) SOLN 1.25 mg Intravitreal  . Bevacizumab (AVASTIN) SOLN 1.25 mg Intravitreal  . Bevacizumab (AVASTIN) SOLN 1.25 mg Intravitreal      REVIEW OF SYSTEMS: ROS    Positive for: Genitourinary, Musculoskeletal, Endocrine, Eyes, Psychiatric, Allergic/Imm   Negative for: Constitutional, Gastrointestinal, Neurological, Skin, HENT, Cardiovascular, Respiratory, Heme/Lymph   Last edited by Roselee Nova D, COT on 11/03/2020  1:14 PM. (History)       ALLERGIES Allergies  Allergen Reactions  . Trilyte [Peg 3350-Kcl-Na Bicarb-Nacl]     HEMATEMESIS  . Vicodin [Hydrocodone-Acetaminophen] Nausea And Vomiting    PAST MEDICAL HISTORY Past Medical History:  Diagnosis Date  . Anxiety and depression   . Cataract  Combined forms OU  . Hypercholesterolemia   . Hypertension   . Hypertensive retinopathy    OU  . Hypothyroidism   . Macular degeneration    OD  . Osteoarthritis   . Seasonal allergies    Past Surgical History:  Procedure Laterality Date  . BIOPSY  10/01/2018   Procedure: BIOPSY;  Surgeon: Danie Binder, MD;  [facility];  Service: Endoscopy;;  gastric bx & gastric polyp  . COLONOSCOPY N/A 10/01/2018   Procedure: COLONOSCOPY;  Surgeon: Danie Binder, MD;  [facility]: [facility];  Service: Endoscopy;  Laterality: N/A;  10:30am  . ESOPHAGOGASTRODUODENOSCOPY N/A  10/01/2018   Procedure: ESOPHAGOGASTRODUODENOSCOPY (EGD);  Surgeon: Danie Binder, MD;  [facility]: [facility];  Service: Endoscopy;  Laterality: N/A;  . POLYPECTOMY  10/01/2018   Procedure: POLYPECTOMY;  Surgeon: Danie Binder, MD;  Location: [facility];  Service: Endoscopy;;  colon   . TUBAL LIGATION    . UTERINE FIBROID SURGERY      FAMILY HISTORY Family History  Problem Relation Age of Onset  . Cancer Mother        lung  . Stroke Father   . Stroke Brother   . Colon cancer Neg Hx     SOCIAL HISTORY Social History   Tobacco Use  . Smoking status: Former Smoker    Types: Cigarettes  . Smokeless tobacco: Never Used  Vaping Use  . Vaping Use: Never used  Substance Use Topics  . Alcohol use: No  . Drug use: No         OPHTHALMIC EXAM:  Base Eye Exam    Visual Acuity (Snellen - Linear)      Right Left   Dist cc 20/40 +1 20/20   Dist ph cc NI    Correction: Glasses       Tonometry (Tonopen, 1:20 PM)      Right Left   Pressure 11 14       Pupils      Dark Light Shape React APD   Right 3 2 Round Brisk None   Left 3 2 Round Brisk None       Visual Fields (Counting fingers)      Left Right    Full Full       Extraocular Movement      Right Left    Full, Ortho Full, Ortho       Neuro/Psych    Oriented x3: Yes   Mood/Affect: Normal       Dilation    Both eyes: 1.0% Mydriacyl, 2.5% Phenylephrine @ 1:20 PM        Slit Lamp and Fundus Exam    Slit Lamp Exam      Right Left   Lids/Lashes Dermatochalasis - upper lid Dermatochalasis - upper lid   Conjunctiva/Sclera White and quiet White and quiet   Cornea Arcus, early nasal band K, trace PEE Arcus, early nasal band K, 1+PEE   Anterior Chamber moderate depth, with narrow angle  moderate depth, with narrow angle temporally   Iris Round and well dilated Round and well dilated   Lens 2-3+ Nuclear sclerosis, 2-3+ Cortical cataract 2+ Nuclear sclerosis, 2+ Cortical cataract   Vitreous Vitreous  syneresis, Posterior vitreous detachment, Weiss ring Vitreous syneresis       Fundus Exam      Right Left   Disc Pink and Sharp, Compact Pink and Sharp, Tilted disc   C/D Ratio 0.3 0.4   Macula  blunted foveal reflex; central SRF - stably resolved; +drusen; +RPE mottling and clumping, no heme Flat, good foveal reflex, Retinal pigment epithelial mottling, superior para-foveal focal area of RPE atrophy -- stable, rare drusen, No heme or edema   Vessels Vascular attenuation, Tortuous Vascular attenuation, Tortuous   Periphery Attached, peripheral drusen / RPE changes nasally, peripheral cystoid degeneration Attached, very mild lattice at 0430, mild patch of Lattice degeneration at 0600 -- stable, scattered peripheral drusen, peripheral cystoid degeneration        Refraction    Wearing Rx      Sphere Cylinder Axis Add   Right +2.00 +1.25 158 +2.50   Left +2.00 +0.75 002 +2.50          IMAGING AND PROCEDURES  Imaging and Procedures for 12/11/17  OCT, Retina - OU - Both Eyes       Right Eye Quality was good. Central Foveal Thickness: 248. Progression has been stable. Findings include no IRF, retinal drusen , pigment epithelial detachment, subretinal hyper-reflective material, normal foveal contour, outer retinal atrophy, no SRF (Stable improvement inf SRF, interval improvement in IRHM, stable PEDs).   Left Eye Quality was good. Central Foveal Thickness: 275. Progression has been stable. Findings include normal foveal contour, no IRF, no SRF, retinal drusen .   Notes *Images captured and stored on drive  Diagnosis / Impression:  OD: Stable improvement inf SRF, interval improvement in IRHM, stable PEDs OS: NFP, No IRF/SRF  Clinical management:  See below  Abbreviations: NFP - Normal foveal profile. CME - cystoid macular edema. PED - pigment epithelial detachment. IRF - intraretinal fluid. SRF - subretinal fluid. EZ - ellipsoid zone. ERM - epiretinal membrane. ORA - outer retinal  atrophy. ORT - outer retinal tubulation. SRHM - subretinal hyper-reflective material         Intravitreal Injection, Pharmacologic Agent - OD - Right Eye       Time Out 11/03/2020. 1:56 PM. Confirmed correct patient, procedure, site, and patient consented.   Anesthesia Topical anesthesia was used. Anesthetic medications included Lidocaine 2%, Proparacaine 0.5%.   Procedure Preparation included 5% betadine to ocular surface, eyelid speculum. A (32g) needle was used.   Injection:  2 mg aflibercept Alfonse Flavors) SOLN   NDC: A3590391, Lot: [id number], Expiration date: 02/25/2021   Route: Intravitreal, Site: Right Eye, Waste: 0.05 mL  Post-op Post injection exam found visual acuity of at least counting fingers. The patient tolerated the procedure well. There were no complications. The patient received written and verbal post procedure care education. Post injection medications were not given.                 ASSESSMENT/PLAN:    ICD-10-CM   1. Exudative age-related macular degeneration of right eye with active choroidal neovascularization (HCC)  H35.3211 Intravitreal Injection, Pharmacologic Agent - OD - Right Eye    aflibercept (EYLEA) SOLN 2 mg  2. Central serous chorioretinopathy of right eye  H35.711   3. Retinal edema  H35.81 OCT, Retina - OU - Both Eyes  4. Essential hypertension  I10   5. Hypertensive retinopathy of both eyes  H35.033   6. Lattice degeneration of left retina  H35.412   7. Posterior vitreous detachment of right eye  H43.811   8. Combined forms of age-related cataract of both eyes  H25.813    1-3. Exudative age related macular degeneration vs CSCR OD  - History of worsening fluid when delayed f/u to 6 wks    - FA (03.02.19) without leakage /  pooling into area of SRF  - review of systems reveals significant stressors in life, but no exogenous steroid use  - discussed treatment with anti-VEGF vs po Eplerenone  - S/P IVA #1 OD (09.27.19),  #2 (10.25.19),  #3 (11.22.19) -- minimal response  - pt approved for [employer] and IVE  - S/P IVE OD #1 (12.20.19), #2 (01.20.20), #3 (02.18.20), #4 (07.20.20) #5 (09.01.20), #6 (10.13.20), #7 (11.20.20), #8 (01.20.21), #9 (02.17.21), #10 (03.19.21), #11 (04.20.21), #12 (05.25.21), #13 (6.23.21), #14 (07.27.21), #15 (08.30.21), #16 (9.28.21), #17 (10.26.21), #18 (11.29.21), #19 (01.11.22), #20 (02.08.22)  - lost to f/u from Feb 2020 to July 2020 due to COVID-19 concerns  - today, VA stable at 20/40 OD  - OCT today shows stable resolution of SRF, interval improvement in [facility], stable PEDs  - recommend IVE OD #21 today, 03.08.22, w/ f/u in 4 wks  - pt wishes to proceed with IVE OD  - RBA of procedure discussed, questions answered  - informed consent obtained   - Eylea informed consent form signed and scanned on 12.22.2020  - see procedure note  - [email or web address] benefits investigation initiated -- approved for 2022  - f/u 4 weeks -- DFE/OCT/ possible injection -- treat and extend next visit  4,5. Hypertensive retinopathy OU  - discussed importance of tight BP control  - stable  - monitor  6. Lattice degeneration OS-  - patch of inferior lattice OS -- no RT, holes or SRF  - asymptomatic  - discussed findings, prognosis, and treatment options including observation  - monitor  7. PVD / vitreous syneresis OD-   - Discussed findings and prognosis  - No RT or RD on 360 peripheral exam  - Reviewed s/s of RT/RD  - Strict return precautions for any such RT/RD signs/symptoms  8. Combined form age-related cataract OU-   - The symptoms of cataract, surgical options, and treatments and risks were discussed with patient.  - discussed diagnosis and progression  - not yet visually significant  - monitor for now  Ophthalmic Meds Ordered this visit:  Meds ordered this encounter  Medications  . aflibercept (EYLEA) SOLN 2 mg      Return in about 4 weeks (around 12/01/2020) for f/u exu ARMD OD.  There are no Patient  Instructions on file for this visit.   This document serves as a record of services personally performed by Gardiner Sleeper, MD, PhD. It was created on their behalf by Estill Bakes, COT an ophthalmic technician. The creation of this record is the provider's dictation and/or activities during the visit.    Electronically signed by: Estill Bakes, COT 3.1.22 @ 12:00 AM   This document serves as a record of services personally performed by Gardiner Sleeper, MD, PhD. It was created on their behalf by San Jetty. Owens Shark, OA an ophthalmic technician. The creation of this record is the provider's dictation and/or activities during the visit.    Electronically signed by: San Jetty. Owens Shark, [place] 03.08.2022 12:00 AM  Gardiner Sleeper, M.D., Ph.D. Diseases & Surgery of the Retina and [place] 11/03/2020   I have reviewed the above documentation for accuracy and completeness, and I agree with the above. Gardiner Sleeper, M.D., Ph.D. 11/04/20 12:00 AM  Abbreviations: M myopia (nearsighted); A astigmatism; H hyperopia (farsighted); P presbyopia; Mrx spectacle prescription;  CTL contact lenses; OD right eye; OS left eye; OU both eyes  XT exotropia; ET esotropia; PEK punctate epithelial keratitis; PEE punctate epithelial  erosions; DES dry eye syndrome; MGD meibomian gland dysfunction; ATs artificial tears; PFAT's preservative free artificial tears; [place] nuclear sclerotic cataract; PSC posterior subcapsular cataract; ERM epi-retinal membrane; PVD posterior vitreous detachment; RD retinal detachment; DM diabetes mellitus; DR diabetic retinopathy; NPDR non-proliferative diabetic retinopathy; PDR proliferative diabetic retinopathy; CSME clinically significant macular edema; DME diabetic macular edema; dbh dot blot hemorrhages; CWS cotton wool spot; POAG primary open angle glaucoma; C/D cup-to-disc ratio; HVF humphrey visual field; GVF goldmann visual field; OCT optical coherence tomography;  IOP intraocular pressure; BRVO Branch retinal vein occlusion; CRVO central retinal vein occlusion; CRAO central retinal artery occlusion; BRAO branch retinal artery occlusion; RT retinal tear; SB scleral buckle; PPV pars plana vitrectomy; VH Vitreous hemorrhage; PRP panretinal laser photocoagulation; IVK intravitreal kenalog; VMT vitreomacular traction; MH Macular hole;  NVD neovascularization of the disc; NVE neovascularization elsewhere; AREDS age related eye disease study; ARMD age related macular degeneration; POAG primary open angle glaucoma; EBMD epithelial/anterior basement membrane dystrophy; ACIOL anterior chamber intraocular lens; IOL intraocular lens; PCIOL posterior chamber intraocular lens; Phaco/IOL phacoemulsification with intraocular lens placement; [employer] photorefractive keratectomy; LASIK laser assisted in situ keratomileusis; HTN hypertension; DM diabetes mellitus; COPD chronic obstructive pulmonary disease

## 2020-11-03 ENCOUNTER — Other Ambulatory Visit: Payer: Self-pay

## 2020-11-03 ENCOUNTER — Encounter (INDEPENDENT_AMBULATORY_CARE_PROVIDER_SITE_OTHER): Payer: Self-pay | Admitting: Ophthalmology

## 2020-11-03 ENCOUNTER — Ambulatory Visit (INDEPENDENT_AMBULATORY_CARE_PROVIDER_SITE_OTHER): Payer: Medicare HMO | Admitting: Ophthalmology

## 2020-11-03 DIAGNOSIS — H25813 Combined forms of age-related cataract, bilateral: Secondary | ICD-10-CM | POA: Diagnosis not present

## 2020-11-03 DIAGNOSIS — H35711 Central serous chorioretinopathy, right eye: Secondary | ICD-10-CM | POA: Diagnosis not present

## 2020-11-03 DIAGNOSIS — H35033 Hypertensive retinopathy, bilateral: Secondary | ICD-10-CM

## 2020-11-03 DIAGNOSIS — H3581 Retinal edema: Secondary | ICD-10-CM

## 2020-11-03 DIAGNOSIS — H35412 Lattice degeneration of retina, left eye: Secondary | ICD-10-CM | POA: Diagnosis not present

## 2020-11-03 DIAGNOSIS — H43811 Vitreous degeneration, right eye: Secondary | ICD-10-CM | POA: Diagnosis not present

## 2020-11-03 DIAGNOSIS — I10 Essential (primary) hypertension: Secondary | ICD-10-CM

## 2020-11-03 DIAGNOSIS — H353211 Exudative age-related macular degeneration, right eye, with active choroidal neovascularization: Secondary | ICD-10-CM | POA: Diagnosis not present

## 2020-11-03 MED ORDER — AFLIBERCEPT 2MG/0.05ML IZ SOLN FOR KALEIDOSCOPE
2.0000 mg | INTRAVITREAL | Status: AC | PRN
  Administered 2020-11-03: 2 mg via INTRAVITREAL

## 2020-11-25 DIAGNOSIS — E039 Hypothyroidism, unspecified: Secondary | ICD-10-CM | POA: Diagnosis not present

## 2020-11-25 DIAGNOSIS — I110 Hypertensive heart disease with heart failure: Secondary | ICD-10-CM | POA: Diagnosis not present

## 2020-11-25 DIAGNOSIS — R69 Illness, unspecified: Secondary | ICD-10-CM | POA: Diagnosis not present

## 2020-11-27 NOTE — Progress Notes (Signed)
[facility] Note  12/01/2020      CHIEF COMPLAINT Patient presents for Retina Follow Up   HISTORY OF PRESENT ILLNESS: Darlene Crawford is a 80 y.o. female who presents to the clinic today for:   HPI    Retina Follow Up    Patient presents with  Wet AMD.  In right eye.  Severity is moderate.  Duration of 4 weeks.  Since onset it is stable.  I, the attending physician,  performed the HPI with the patient and updated documentation appropriately.          Comments    Patient states vision the same OU.       Last edited by Bernarda Caffey, MD on 12/01/2020  1:47 PM. (History)      Referring physician: Leticia Clas, [facility] [address] [place],  [place] [address]  HISTORICAL INFORMATION:   Selected notes from the MEDICAL RECORD NUMBER Referred by Dr. Leander Rams for concern of ARMD OD with progression to CNVM vs CSR   CURRENT MEDICATIONS: Current Outpatient Medications (Ophthalmic Drugs)  Medication Sig  . ARTIFICIAL TEAR SOLUTION OP Place 1 drop into both eyes daily as needed (dry eyes).   Current Facility-Administered Medications (Ophthalmic Drugs)  Medication Route  . aflibercept (EYLEA) SOLN 2 mg Intravitreal  . aflibercept (EYLEA) SOLN 2 mg Intravitreal  . aflibercept (EYLEA) SOLN 2 mg Intravitreal   Current Outpatient Medications (Other)  Medication Sig  . acidophilus (RISAQUAD) CAPS capsule Take 1 capsule by mouth daily.  Marland Kitchen atorvastatin (LIPITOR) 10 MG tablet Take 10 mg by mouth at bedtime.   . Cholecalciferol (VITAMIN D) 50 MCG (2000 UT) tablet Take 4,000 Units by mouth at bedtime.   . Cyanocobalamin (B-12 PO) Take 1 Dose by mouth 2 (two) times a week.  . fluticasone (FLONASE) 50 MCG/ACT nasal spray Place 1 spray into both nostrils daily as needed for allergies or rhinitis.  Marland Kitchen levothyroxine (SYNTHROID, LEVOTHROID) 100 MCG tablet Take 100 mcg by mouth daily before breakfast.  . loratadine (CLARITIN) 10 MG tablet Take 10 mg by mouth daily  as needed for allergies.   Marland Kitchen losartan (COZAAR) 100 MG tablet Take 100 mg by mouth daily.   . Melatonin 10 MG CAPS Take 10 mg by mouth at bedtime.  . Multiple Vitamins-Minerals (PRESERVISION AREDS 2 PO) Take 1 tablet by mouth at bedtime.   . sertraline (ZOLOFT) 50 MG tablet Take 50 mg by mouth daily.  . sodium chloride (OCEAN) 0.65 % SOLN nasal spray Place 1 spray into both nostrils as needed for congestion.  . hydrochlorothiazide (HYDRODIURIL) 25 MG tablet Take 25 mg by mouth daily. 1/2 tablet am, 1/2 tablet pm (Patient not taking: Reported on 12/01/2020)   Current Facility-Administered Medications (Other)  Medication Route  . Bevacizumab (AVASTIN) SOLN 1.25 mg Intravitreal  . Bevacizumab (AVASTIN) SOLN 1.25 mg Intravitreal  . Bevacizumab (AVASTIN) SOLN 1.25 mg Intravitreal      REVIEW OF SYSTEMS: ROS    Positive for: Genitourinary, Musculoskeletal, Endocrine, Eyes, Psychiatric, Allergic/Imm   Negative for: Constitutional, Gastrointestinal, Neurological, Skin, HENT, Cardiovascular, Respiratory, Heme/Lymph   Last edited by Roselee Nova D, COT on 12/01/2020  1:08 PM. (History)       ALLERGIES Allergies  Allergen Reactions  . Trilyte [Peg 3350-Kcl-Na Bicarb-Nacl]     HEMATEMESIS  . Vicodin [Hydrocodone-Acetaminophen] Nausea And Vomiting    PAST MEDICAL HISTORY Past Medical History:  Diagnosis Date  . Anxiety and depression   .  Cataract    Combined forms OU  . Hypercholesterolemia   . Hypertension   . Hypertensive retinopathy    OU  . Hypothyroidism   . Macular degeneration    OD  . Osteoarthritis   . Seasonal allergies    Past Surgical History:  Procedure Laterality Date  . BIOPSY  10/01/2018   Procedure: BIOPSY;  Surgeon: Danie Binder, MD;  [facility];  Service: Endoscopy;;  gastric bx & gastric polyp  . COLONOSCOPY N/A 10/01/2018   Procedure: COLONOSCOPY;  Surgeon: Danie Binder, MD;  [facility]: [facility];  Service: Endoscopy;  Laterality: N/A;   10:30am  . ESOPHAGOGASTRODUODENOSCOPY N/A 10/01/2018   Procedure: ESOPHAGOGASTRODUODENOSCOPY (EGD);  Surgeon: Danie Binder, MD;  [facility]: [facility];  Service: Endoscopy;  Laterality: N/A;  . POLYPECTOMY  10/01/2018   Procedure: POLYPECTOMY;  Surgeon: Danie Binder, MD;  [facility]: [facility];  Service: Endoscopy;;  colon   . TUBAL LIGATION    . UTERINE FIBROID SURGERY      FAMILY HISTORY Family History  Problem Relation Age of Onset  . Cancer Mother        lung  . Stroke Father   . Stroke Brother   . Colon cancer Neg Hx     SOCIAL HISTORY Social History   Tobacco Use  . Smoking status: Former Smoker    Types: Cigarettes  . Smokeless tobacco: Never Used  Vaping Use  . Vaping Use: Never used  Substance Use Topics  . Alcohol use: No  . Drug use: No         OPHTHALMIC EXAM:  Base Eye Exam    Visual Acuity (Snellen - Linear)      Right Left   Dist cc 20/40 -1 20/20 -2   Dist ph cc NI    Correction: Glasses       Tonometry (Tonopen, 1:16 PM)      Right Left   Pressure 11 08       Pupils      Dark Light Shape React APD   Right 3 2 Round Brisk None   Left 3 2 Round Brisk None       Visual Fields (Counting fingers)      Left Right    Full Full       Extraocular Movement      Right Left    Full, Ortho Full, Ortho       Neuro/Psych    Oriented x3: Yes   Mood/Affect: Normal       Dilation    Right eye: 1.0% Mydriacyl, 2.5% Phenylephrine @ 1:14 PM  Patient refuses dilation OS        Slit Lamp and Fundus Exam    Slit Lamp Exam      Right Left   Lids/Lashes Dermatochalasis - upper lid Dermatochalasis - upper lid   Conjunctiva/Sclera White and quiet White and quiet   Cornea Arcus, early nasal band K, trace PEE Arcus, early nasal band K, 1+PEE   Anterior Chamber moderate depth, with narrow angle  moderate depth, with narrow angle temporally   Iris Round and well dilated Round and well dilated   Lens 2-3+ Nuclear sclerosis, 2-3+ Cortical  cataract 2+ Nuclear sclerosis, 2+ Cortical cataract   Vitreous Vitreous syneresis, Posterior vitreous detachment, Weiss ring Vitreous syneresis       Fundus Exam      Right Left   Disc Pink and Sharp, Compact Pink and Sharp, Tilted  disc   C/D Ratio 0.3 0.4   Macula blunted foveal reflex; central SRF - stably resolved; +drusen; +RPE mottling and clumping, no heme Flat, good foveal reflex, Retinal pigment epithelial mottling, superior para-foveal focal area of RPE atrophy -- stable, rare drusen, No heme or edema   Vessels Vascular attenuation, Tortuous Vascular attenuation, Tortuous   Periphery Attached, peripheral drusen / RPE changes nasally, peripheral cystoid degeneration Attached, very mild lattice at 0430, mild patch of Lattice degeneration at 0600 -- stable, scattered peripheral drusen, peripheral cystoid degeneration          IMAGING AND PROCEDURES  Imaging and Procedures for 12/11/17  OCT, Retina - OU - Both Eyes       Right Eye Quality was good. Central Foveal Thickness: 251. Progression has been stable. Findings include no IRF, retinal drusen , pigment epithelial detachment, subretinal hyper-reflective material, normal foveal contour, outer retinal atrophy, no SRF (Stable improvement inf SRF, stable PEDs).   Left Eye Quality was good. Central Foveal Thickness: 278. Progression has been stable. Findings include normal foveal contour, no IRF, no SRF, retinal drusen .   Notes *Images captured and stored on drive  Diagnosis / Impression:  OD: Stable improvement inf SRF, stable low PEDs OS: NFP, No IRF/SRF  Clinical management:  See below  Abbreviations: NFP - Normal foveal profile. CME - cystoid macular edema. PED - pigment epithelial detachment. IRF - intraretinal fluid. SRF - subretinal fluid. EZ - ellipsoid zone. ERM - epiretinal membrane. ORA - outer retinal atrophy. ORT - outer retinal tubulation. SRHM - subretinal hyper-reflective material         Intravitreal  Injection, Pharmacologic Agent - OD - Right Eye       Time Out 12/01/2020. 1:38 PM. Confirmed correct patient, procedure, site, and patient consented.   Anesthesia Topical anesthesia was used. Anesthetic medications included Lidocaine 2%, Proparacaine 0.5%.   Procedure Preparation included 5% betadine to ocular surface, eyelid speculum. A (32g) needle was used.   Injection:  2 mg aflibercept Alfonse Flavors) SOLN   NDC: A3590391, Lot: [id number], Expiration date: 07/28/2021   Route: Intravitreal, Site: Right Eye, Waste: 0.05 mL  Post-op Post injection exam found visual acuity of at least counting fingers. The patient tolerated the procedure well. There were no complications. The patient received written and verbal post procedure care education. Post injection medications were not given.                 ASSESSMENT/PLAN:    ICD-10-CM   1. Exudative age-related macular degeneration of right eye with active choroidal neovascularization (HCC)  H35.3211 Intravitreal Injection, Pharmacologic Agent - OD - Right Eye    aflibercept (EYLEA) SOLN 2 mg  2. Central serous chorioretinopathy of right eye  H35.711   3. Retinal edema  H35.81 OCT, Retina - OU - Both Eyes  4. Essential hypertension  I10   5. Hypertensive retinopathy of both eyes  H35.033   6. Lattice degeneration of left retina  H35.412   7. Posterior vitreous detachment of right eye  H43.811   8. Combined forms of age-related cataract of both eyes  H25.813    1-3. Exudative age related macular degeneration vs CSCR OD  - History of worsening fluid when delayed f/u to 6 wks    - FA (03.02.19) without leakage / pooling into area of SRF  - review of systems reveals significant stressors in life, but no exogenous steroid use  - discussed treatment with anti-VEGF vs po Eplerenone  - S/P IVA #  1 OD (09.27.19),  #2 (10.25.19), #3 (11.22.19) -- minimal response  - pt approved for [employer] and IVE  - S/P IVE OD #1 (12.20.19), #2  (01.20.20), #3 (02.18.20), #4 (07.20.20) #5 (09.01.20), #6 (10.13.20), #7 (11.20.20), #8 (01.20.21), #9 (02.17.21), #10 (03.19.21), #11 (04.20.21), #12 (05.25.21), #13 (6.23.21), #14 (07.27.21), #15 (08.30.21), #16 (9.28.21), #17 (10.26.21), #18 (11.29.21), #19 (01.11.22), #20 (02.08.22), #21 (3.8.22)  - lost to f/u from Feb 2020 to July 2020 due to COVID-19 concerns  - today, VA stable at 20/40 OD  - OCT today shows stable resolution of SRF, stable PEDs  - recommend IVE OD #22 today, 04.05.22, w/ f/u in 5 wks  - pt wishes to proceed with IVE OD  - RBA of procedure discussed, questions answered  - informed consent obtained   - Eylea informed consent form signed and scanned on 12.22.2020  - see procedure note  - [email or web address] benefits investigation initiated -- approved for 2022  - f/u 5 weeks -- DFE/OCT/ possible injection -- possible treat and extend  4,5. Hypertensive retinopathy OU  - discussed importance of tight BP control  - stable  - monitor  6. Lattice degeneration OS-  - patch of inferior lattice OS -- no RT, holes or SRF  - asymptomatic  - discussed findings, prognosis, and treatment options including observation  - monitor  7. PVD / vitreous syneresis OD-   - Discussed findings and prognosis  - No RT or RD on 360 peripheral exam  - Reviewed s/s of RT/RD  - Strict return precautions for any such RT/RD signs/symptoms  8. Combined form age-related cataract OU-   - The symptoms of cataract, surgical options, and treatments and risks were discussed with patient.  - discussed diagnosis and progression  - not yet visually significant  - monitor for now  Ophthalmic Meds Ordered this visit:  Meds ordered this encounter  Medications  . aflibercept (EYLEA) SOLN 2 mg      Return in about 5 weeks (around 01/05/2021) for f/u exu ARMD OD, DFE, OCT.  There are no Patient Instructions on file for this visit.   This document serves as a record of services personally performed by  Gardiner Sleeper, MD, PhD. It was created on their behalf by Estill Bakes, COT an ophthalmic technician. The creation of this record is the provider's dictation and/or activities during the visit.    Electronically signed by: Estill Bakes, COT 4.1.22 @ 1:48 PM   This document serves as a record of services personally performed by Gardiner Sleeper, MD, PhD. It was created on their behalf by San Jetty. Owens Shark, OA an ophthalmic technician. The creation of this record is the provider's dictation and/or activities during the visit.    Electronically signed by: San Jetty. Owens Shark, [place] 04.05.2022 1:48 PM  Gardiner Sleeper, M.D., Ph.D. Diseases & Surgery of the Retina and [place] 12/01/2020   I have reviewed the above documentation for accuracy and completeness, and I agree with the above. Gardiner Sleeper, M.D., Ph.D. 12/01/20 1:51 PM   Abbreviations: M myopia (nearsighted); A astigmatism; H hyperopia (farsighted); P presbyopia; Mrx spectacle prescription;  CTL contact lenses; OD right eye; OS left eye; OU both eyes  XT exotropia; ET esotropia; PEK punctate epithelial keratitis; PEE punctate epithelial erosions; DES dry eye syndrome; MGD meibomian gland dysfunction; ATs artificial tears; PFAT's preservative free artificial tears; [place] nuclear sclerotic cataract; PSC posterior subcapsular cataract; ERM epi-retinal membrane; PVD posterior vitreous detachment; RD  retinal detachment; DM diabetes mellitus; DR diabetic retinopathy; NPDR non-proliferative diabetic retinopathy; PDR proliferative diabetic retinopathy; CSME clinically significant macular edema; DME diabetic macular edema; dbh dot blot hemorrhages; CWS cotton wool spot; POAG primary open angle glaucoma; C/D cup-to-disc ratio; HVF humphrey visual field; GVF goldmann visual field; OCT optical coherence tomography; IOP intraocular pressure; BRVO Branch retinal vein occlusion; CRVO central retinal vein occlusion; CRAO central  retinal artery occlusion; BRAO branch retinal artery occlusion; RT retinal tear; SB scleral buckle; PPV pars plana vitrectomy; VH Vitreous hemorrhage; PRP panretinal laser photocoagulation; IVK intravitreal kenalog; VMT vitreomacular traction; MH Macular hole;  NVD neovascularization of the disc; NVE neovascularization elsewhere; AREDS age related eye disease study; ARMD age related macular degeneration; POAG primary open angle glaucoma; EBMD epithelial/anterior basement membrane dystrophy; ACIOL anterior chamber intraocular lens; IOL intraocular lens; PCIOL posterior chamber intraocular lens; Phaco/IOL phacoemulsification with intraocular lens placement; [place] photorefractive keratectomy; LASIK laser assisted in situ keratomileusis; HTN hypertension; DM diabetes mellitus; COPD chronic obstructive pulmonary disease

## 2020-12-01 ENCOUNTER — Encounter (INDEPENDENT_AMBULATORY_CARE_PROVIDER_SITE_OTHER): Payer: Self-pay | Admitting: Ophthalmology

## 2020-12-01 ENCOUNTER — Other Ambulatory Visit: Payer: Self-pay

## 2020-12-01 ENCOUNTER — Ambulatory Visit (INDEPENDENT_AMBULATORY_CARE_PROVIDER_SITE_OTHER): Payer: Medicare HMO | Admitting: Ophthalmology

## 2020-12-01 DIAGNOSIS — H35711 Central serous chorioretinopathy, right eye: Secondary | ICD-10-CM | POA: Diagnosis not present

## 2020-12-01 DIAGNOSIS — H3581 Retinal edema: Secondary | ICD-10-CM | POA: Diagnosis not present

## 2020-12-01 DIAGNOSIS — H353211 Exudative age-related macular degeneration, right eye, with active choroidal neovascularization: Secondary | ICD-10-CM | POA: Diagnosis not present

## 2020-12-01 DIAGNOSIS — H43811 Vitreous degeneration, right eye: Secondary | ICD-10-CM | POA: Diagnosis not present

## 2020-12-01 DIAGNOSIS — I10 Essential (primary) hypertension: Secondary | ICD-10-CM

## 2020-12-01 DIAGNOSIS — H35033 Hypertensive retinopathy, bilateral: Secondary | ICD-10-CM

## 2020-12-01 DIAGNOSIS — H35412 Lattice degeneration of retina, left eye: Secondary | ICD-10-CM | POA: Diagnosis not present

## 2020-12-01 DIAGNOSIS — H25813 Combined forms of age-related cataract, bilateral: Secondary | ICD-10-CM

## 2020-12-01 MED ORDER — AFLIBERCEPT 2MG/0.05ML IZ SOLN FOR KALEIDOSCOPE
2.0000 mg | INTRAVITREAL | Status: AC | PRN
  Administered 2020-12-01: 2 mg via INTRAVITREAL

## 2020-12-15 DIAGNOSIS — B029 Zoster without complications: Secondary | ICD-10-CM | POA: Diagnosis not present

## 2020-12-15 DIAGNOSIS — N183 Chronic kidney disease, stage 3 unspecified: Secondary | ICD-10-CM | POA: Diagnosis not present

## 2020-12-15 DIAGNOSIS — R69 Illness, unspecified: Secondary | ICD-10-CM | POA: Diagnosis not present

## 2020-12-15 DIAGNOSIS — Z6832 Body mass index (BMI) 32.0-32.9, adult: Secondary | ICD-10-CM | POA: Diagnosis not present

## 2020-12-15 DIAGNOSIS — Z299 Encounter for prophylactic measures, unspecified: Secondary | ICD-10-CM | POA: Diagnosis not present

## 2020-12-15 DIAGNOSIS — I10 Essential (primary) hypertension: Secondary | ICD-10-CM | POA: Diagnosis not present

## 2021-01-04 NOTE — Progress Notes (Signed)
[facility] Note  01/05/2021      CHIEF COMPLAINT Patient presents for Retina Follow Up   HISTORY OF PRESENT ILLNESS: Darlene Crawford is a 80 y.o. female who presents to the clinic today for:   HPI    Retina Follow Up    Patient presents with  Wet AMD.  In right eye.  Severity is moderate.  Duration of 5 weeks.  Since onset it is stable.  I, the attending physician,  performed the HPI with the patient and updated documentation appropriately.          Comments    Patient states vision the same OU.        Last edited by Bernarda Caffey, MD on 01/05/2021 10:02 PM. (History)    pt states vision is stable, she just go over a case of shingles  Referring physician: Berenice Primas [place],  [place] [id number]  HISTORICAL INFORMATION:   Selected notes from the [place] Referred by Dr. Leander Rams for concern of ARMD OD with progression to CNVM vs CSR   CURRENT MEDICATIONS: Current Outpatient Medications (Ophthalmic Drugs)  Medication Sig  . ARTIFICIAL TEAR SOLUTION OP Place 1 drop into both eyes daily as needed (dry eyes).   Current Facility-Administered Medications (Ophthalmic Drugs)  Medication Route  . aflibercept (EYLEA) SOLN 2 mg Intravitreal  . aflibercept (EYLEA) SOLN 2 mg Intravitreal  . aflibercept (EYLEA) SOLN 2 mg Intravitreal   Current Outpatient Medications (Other)  Medication Sig  . acidophilus (RISAQUAD) CAPS capsule Take 1 capsule by mouth daily.  Marland Kitchen atorvastatin (LIPITOR) 10 MG tablet Take 10 mg by mouth at bedtime.   . Cholecalciferol (VITAMIN D) 50 MCG (2000 UT) tablet Take 4,000 Units by mouth at bedtime.   . Cyanocobalamin (B-12 PO) Take 1 Dose by mouth 2 (two) times a week.  . fluticasone (FLONASE) 50 MCG/ACT nasal spray Place 1 spray into both nostrils daily as needed for allergies or rhinitis.  Marland Kitchen levothyroxine (SYNTHROID, LEVOTHROID) 100 MCG tablet Take 100 mcg by mouth daily before breakfast.  .  loratadine (CLARITIN) 10 MG tablet Take 10 mg by mouth daily as needed for allergies.   Marland Kitchen losartan (COZAAR) 100 MG tablet Take 100 mg by mouth daily.   . Melatonin 10 MG CAPS Take 10 mg by mouth at bedtime.  . Multiple Vitamins-Minerals (PRESERVISION AREDS 2 PO) Take 1 tablet by mouth at bedtime.   . sertraline (ZOLOFT) 50 MG tablet Take 50 mg by mouth daily.  . sodium chloride (OCEAN) 0.65 % SOLN nasal spray Place 1 spray into both nostrils as needed for congestion.  . hydrochlorothiazide (HYDRODIURIL) 25 MG tablet Take 25 mg by mouth daily. 1/2 tablet am, 1/2 tablet pm (Patient not taking: No sig reported)   Current Facility-Administered Medications (Other)  Medication Route  . Bevacizumab (AVASTIN) SOLN 1.25 mg Intravitreal  . Bevacizumab (AVASTIN) SOLN 1.25 mg Intravitreal  . Bevacizumab (AVASTIN) SOLN 1.25 mg Intravitreal      REVIEW OF SYSTEMS: ROS    Positive for: Genitourinary, Musculoskeletal, Endocrine, Eyes, Psychiatric, Allergic/Imm   Negative for: Constitutional, Gastrointestinal, Neurological, Skin, HENT, Cardiovascular, Respiratory, Heme/Lymph   Last edited by Roselee Nova D, COT on 01/05/2021  1:21 PM. (History)       ALLERGIES Allergies  Allergen Reactions  . Trilyte [Peg 3350-Kcl-Na Bicarb-Nacl]     HEMATEMESIS  . Vicodin [Hydrocodone-Acetaminophen] Nausea And Vomiting    PAST MEDICAL HISTORY Past Medical History:  Diagnosis Date  . Anxiety and depression   . Cataract    Combined forms OU  . Hypercholesterolemia   . Hypertension   . Hypertensive retinopathy    OU  . Hypothyroidism   . Macular degeneration    OD  . Osteoarthritis   . Seasonal allergies    Past Surgical History:  Procedure Laterality Date  . BIOPSY  10/01/2018   Procedure: BIOPSY;  Surgeon: Danie Binder, MD;  [facility]: [facility];  Service: Endoscopy;;  gastric bx & gastric polyp  . COLONOSCOPY N/A 10/01/2018   Procedure: COLONOSCOPY;  Surgeon: Danie Binder, MD;  Location:  [facility];  Service: Endoscopy;  Laterality: N/A;  10:30am  . ESOPHAGOGASTRODUODENOSCOPY N/A 10/01/2018   Procedure: ESOPHAGOGASTRODUODENOSCOPY (EGD);  Surgeon: Danie Binder, MD;  Location: [facility];  Service: Endoscopy;  Laterality: N/A;  . POLYPECTOMY  10/01/2018   Procedure: POLYPECTOMY;  Surgeon: Danie Binder, MD;  Location: [facility];  Service: Endoscopy;;  colon   . TUBAL LIGATION    . UTERINE FIBROID SURGERY      FAMILY HISTORY Family History  Problem Relation Age of Onset  . Cancer Mother        lung  . Stroke Father   . Stroke Brother   . Colon cancer Neg Hx     SOCIAL HISTORY Social History   Tobacco Use  . Smoking status: Former Smoker    Types: Cigarettes  . Smokeless tobacco: Never Used  Vaping Use  . Vaping Use: Never used  Substance Use Topics  . Alcohol use: No  . Drug use: No         OPHTHALMIC EXAM:  Base Eye Exam    Visual Acuity (Snellen - Linear)      Right Left   Dist cc 20/40 +1 20/20 -1   Dist ph cc 20/40 +2        Tonometry (Tonopen, 1:28 PM)      Right Left   Pressure 08 08       Pupils      Dark Light Shape React APD   Right 3 2 Round Brisk None   Left 3 2 Round Brisk None       Visual Fields (Counting fingers)      Left Right    Full Full       Extraocular Movement      Right Left    Full, Ortho Full, Ortho       Neuro/Psych    Oriented x3: Yes   Mood/Affect: Normal       Dilation    Right eye: 1.0% Mydriacyl, 2.5% Phenylephrine @ 1:28 PM        Slit Lamp and Fundus Exam    Slit Lamp Exam      Right Left   Lids/Lashes Dermatochalasis - upper lid Dermatochalasis - upper lid   Conjunctiva/Sclera White and quiet White and quiet   Cornea Arcus, early nasal band K, trace PEE Arcus, early nasal band K, 1+PEE   Anterior Chamber moderate depth, with narrow angle  moderate depth, with narrow angle temporally   Iris Round and well dilated Round and well dilated   Lens 2-3+ Nuclear sclerosis, 2-3+  Cortical cataract 2+ Nuclear sclerosis, 2+ Cortical cataract   Vitreous Vitreous syneresis, Posterior vitreous detachment, Weiss ring Vitreous syneresis       Fundus Exam      Right Left   Disc Pink and Sharp, Compact Pink and  Sharp, Tilted disc   C/D Ratio 0.3 0.4   Macula blunted foveal reflex; central SRF - stably resolved; +drusen; +RPE mottling and clumping, no heme or edema Flat, good foveal reflex, Retinal pigment epithelial mottling, superior para-foveal focal area of RPE atrophy -- stable, rare drusen, No heme or edema   Vessels Vascular attenuation, Tortuous Vascular attenuation, Tortuous   Periphery Attached, peripheral drusen / RPE changes nasally, peripheral cystoid degeneration Attached, very mild lattice at 0430, mild patch of Lattice degeneration at 0600 -- stable, scattered peripheral drusen, peripheral cystoid degeneration        Refraction    Wearing Rx      Sphere Cylinder Axis Add   Right +2.00 +1.25 158 +2.50   Left +2.00 +0.75 002 +2.50          IMAGING AND PROCEDURES  Imaging and Procedures for 12/11/17  OCT, Retina - OU - Both Eyes       Right Eye Quality was good. Central Foveal Thickness: 251. Progression has been stable. Findings include no IRF, retinal drusen , normal foveal contour, no SRF.   Left Eye Quality was good. Central Foveal Thickness: 276. Progression has been stable. Findings include normal foveal contour, no IRF, no SRF, retinal drusen .   Notes *Images captured and stored on drive  Diagnosis / Impression:  OD: Stable improvement in SRF OS: NFP, No IRF/SRF  Clinical management:  See below  Abbreviations: NFP - Normal foveal profile. CME - cystoid macular edema. PED - pigment epithelial detachment. IRF - intraretinal fluid. SRF - subretinal fluid. EZ - ellipsoid zone. ERM - epiretinal membrane. ORA - outer retinal atrophy. ORT - outer retinal tubulation. SRHM - subretinal hyper-reflective material         Intravitreal  Injection, Pharmacologic Agent - OD - Right Eye       Time Out 01/05/2021. 1:54 PM. Confirmed correct patient, procedure, site, and patient consented.   Anesthesia Topical anesthesia was used. Anesthetic medications included Lidocaine 2%, Proparacaine 0.5%.   Procedure Preparation included 5% betadine to ocular surface, eyelid speculum. A (32g) needle was used.   Injection:  2 mg aflibercept Alfonse Flavors) SOLN   NDC: A3590391, Lot: [id number], Expiration date: 08/27/2021   Route: Intravitreal, Site: Right Eye, Waste: 0.05 mL  Post-op Post injection exam found visual acuity of at least counting fingers. The patient tolerated the procedure well. There were no complications. The patient received written and verbal post procedure care education. Post injection medications were not given.                 ASSESSMENT/PLAN:    ICD-10-CM   1. Exudative age-related macular degeneration of right eye with active choroidal neovascularization (HCC)  H35.3211 Intravitreal Injection, Pharmacologic Agent - OD - Right Eye    aflibercept (EYLEA) SOLN 2 mg  2. Central serous chorioretinopathy of right eye  H35.711   3. Retinal edema  H35.81 OCT, Retina - OU - Both Eyes  4. Essential hypertension  I10   5. Hypertensive retinopathy of both eyes  H35.033   6. Lattice degeneration of left retina  H35.412   7. Posterior vitreous detachment of right eye  H43.811   8. Combined forms of age-related cataract of both eyes  H25.813    1-3. Exudative age related macular degeneration vs CSCR OD  - Prior history of worsening fluid when delayed f/u to 6 wks    - FA (03.02.19) without leakage / pooling into area of SRF  - review of systems  reveals significant stressors in life, but no exogenous steroid use  - discussed treatment with anti-VEGF vs po Eplerenone  - S/P IVA #1 OD (09.27.19),  #2 (10.25.19), #3 (11.22.19) -- minimal response  - pt approved for [employer] and IVE  - S/P IVE OD #1 (12.20.19), #2  (01.20.20), #3 (02.18.20), #4 (07.20.20) #5 (09.01.20), #6 (10.13.20), #7 (11.20.20), #8 (01.20.21), #9 (02.17.21), #10 (03.19.21), #11 (04.20.21), #12 (05.25.21), #13 (6.23.21), #14 (07.27.21), #15 (08.30.21), #16 (9.28.21), #17 (10.26.21), #18 (11.29.21), #19 (01.11.22), #20 (02.08.22), #21 (3.8.22), #22 (04.05.22)  - lost to f/u from Feb 2020 to July 2020 due to COVID-19 concerns  - today, VA stable at 20/40 OD -- ?new baseline  - OCT today shows stable resolution of SRF  - recommend IVE OD #23 today, 05.10.22 -- maintenance w/ ext to 6 wks  - pt wishes to proceed with IVE OD  - RBA of procedure discussed, questions answered  - informed consent obtained   - Eylea informed consent form signed and scanned on 12.22.2020  - see procedure note  - [email or web address] benefits investigation initiated -- approved for 2022  - f/u 6 weeks -- DFE/OCT/ possible injection -- possible treat and extend  4,5. Hypertensive retinopathy OU  - discussed importance of tight BP control  - stable  - monitor  6. Lattice degeneration OS-  - patch of inferior lattice OS -- no RT, holes or SRF  - asymptomatic  - discussed findings, prognosis, and treatment options including observation  - monitor  7. PVD / vitreous syneresis OD-   - Discussed findings and prognosis  - No RT or RD on 360 peripheral exam  - Reviewed s/s of RT/RD  - Strict return precautions for any such RT/RD signs/symptoms  8. Combined form age-related cataract OU-   - The symptoms of cataract, surgical options, and treatments and risks were discussed with patient.  - discussed diagnosis and progression  - not yet visually significant  - monitor for now  Ophthalmic Meds Ordered this visit:  Meds ordered this encounter  Medications  . aflibercept (EYLEA) SOLN 2 mg      Return in about 6 weeks (around 02/16/2021) for f/u exu ARMD OD, DFE, OCT.  There are no Patient Instructions on file for this visit.   This document serves as a record of  services personally performed by Gardiner Sleeper, MD, PhD. It was created on their behalf by Leeann Must, [place], an ophthalmic technician. The creation of this record is the provider's dictation and/or activities during the visit.    Electronically signed by: Leeann Must, COA @TODAY @ 10:14 PM  Gardiner Sleeper, M.D., Ph.D. Diseases & Surgery of the Retina and [place] 01/05/2021   I have reviewed the above documentation for accuracy and completeness, and I agree with the above. Gardiner Sleeper, M.D., Ph.D. 01/05/21 10:14 PM   Abbreviations: M myopia (nearsighted); A astigmatism; H hyperopia (farsighted); P presbyopia; Mrx spectacle prescription;  CTL contact lenses; OD right eye; OS left eye; OU both eyes  XT exotropia; ET esotropia; PEK punctate epithelial keratitis; PEE punctate epithelial erosions; DES dry eye syndrome; MGD meibomian gland dysfunction; ATs artificial tears; PFAT's preservative free artificial tears; [place] nuclear sclerotic cataract; PSC posterior subcapsular cataract; ERM epi-retinal membrane; PVD posterior vitreous detachment; RD retinal detachment; DM diabetes mellitus; DR diabetic retinopathy; NPDR non-proliferative diabetic retinopathy; PDR proliferative diabetic retinopathy; CSME clinically significant macular edema; DME diabetic macular edema; dbh dot blot hemorrhages; CWS cotton wool  spot; POAG primary open angle glaucoma; C/D cup-to-disc ratio; HVF humphrey visual field; GVF goldmann visual field; OCT optical coherence tomography; IOP intraocular pressure; BRVO Branch retinal vein occlusion; CRVO central retinal vein occlusion; CRAO central retinal artery occlusion; BRAO branch retinal artery occlusion; RT retinal tear; SB scleral buckle; PPV pars plana vitrectomy; VH Vitreous hemorrhage; PRP panretinal laser photocoagulation; IVK intravitreal kenalog; VMT vitreomacular traction; MH Macular hole;  NVD neovascularization of the disc; NVE  neovascularization elsewhere; AREDS age related eye disease study; ARMD age related macular degeneration; POAG primary open angle glaucoma; EBMD epithelial/anterior basement membrane dystrophy; ACIOL anterior chamber intraocular lens; IOL intraocular lens; PCIOL posterior chamber intraocular lens; Phaco/IOL phacoemulsification with intraocular lens placement; [place] photorefractive keratectomy; LASIK laser assisted in situ keratomileusis; HTN hypertension; DM diabetes mellitus; COPD chronic obstructive pulmonary disease

## 2021-01-05 ENCOUNTER — Ambulatory Visit (INDEPENDENT_AMBULATORY_CARE_PROVIDER_SITE_OTHER): Payer: Medicare HMO | Admitting: Ophthalmology

## 2021-01-05 ENCOUNTER — Other Ambulatory Visit: Payer: Self-pay

## 2021-01-05 ENCOUNTER — Encounter (INDEPENDENT_AMBULATORY_CARE_PROVIDER_SITE_OTHER): Payer: Self-pay | Admitting: Ophthalmology

## 2021-01-05 DIAGNOSIS — I10 Essential (primary) hypertension: Secondary | ICD-10-CM | POA: Diagnosis not present

## 2021-01-05 DIAGNOSIS — H43811 Vitreous degeneration, right eye: Secondary | ICD-10-CM

## 2021-01-05 DIAGNOSIS — H35033 Hypertensive retinopathy, bilateral: Secondary | ICD-10-CM | POA: Diagnosis not present

## 2021-01-05 DIAGNOSIS — H3581 Retinal edema: Secondary | ICD-10-CM

## 2021-01-05 DIAGNOSIS — H35711 Central serous chorioretinopathy, right eye: Secondary | ICD-10-CM | POA: Diagnosis not present

## 2021-01-05 DIAGNOSIS — H25813 Combined forms of age-related cataract, bilateral: Secondary | ICD-10-CM

## 2021-01-05 DIAGNOSIS — H353211 Exudative age-related macular degeneration, right eye, with active choroidal neovascularization: Secondary | ICD-10-CM | POA: Diagnosis not present

## 2021-01-05 DIAGNOSIS — H35412 Lattice degeneration of retina, left eye: Secondary | ICD-10-CM | POA: Diagnosis not present

## 2021-01-05 MED ORDER — AFLIBERCEPT 2MG/0.05ML IZ SOLN FOR KALEIDOSCOPE
2.0000 mg | INTRAVITREAL | Status: AC | PRN
  Administered 2021-01-05: 2 mg via INTRAVITREAL

## 2021-01-13 DIAGNOSIS — I10 Essential (primary) hypertension: Secondary | ICD-10-CM | POA: Diagnosis not present

## 2021-01-13 DIAGNOSIS — Z299 Encounter for prophylactic measures, unspecified: Secondary | ICD-10-CM | POA: Diagnosis not present

## 2021-01-13 DIAGNOSIS — N183 Chronic kidney disease, stage 3 unspecified: Secondary | ICD-10-CM | POA: Diagnosis not present

## 2021-01-13 DIAGNOSIS — Z789 Other specified health status: Secondary | ICD-10-CM | POA: Diagnosis not present

## 2021-01-13 DIAGNOSIS — Z6833 Body mass index (BMI) 33.0-33.9, adult: Secondary | ICD-10-CM | POA: Diagnosis not present

## 2021-02-12 NOTE — Progress Notes (Addendum)
[facility] Note  02/16/2021      CHIEF COMPLAINT Patient presents for Retina Follow Up   HISTORY OF PRESENT ILLNESS: Darlene Crawford is a 80 y.o. 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  Duration of 6 weeks.  Since onset it is stable.  I, the attending physician,  performed the HPI with the patient and updated documentation appropriately.        Comments   Doing well.  She does have floaters that she notices more when reading, this is nothing new.  She denies FOLs.  Vision stable OU.      Last edited by Bernarda Caffey, MD on 02/16/2021  2:18 PM.    Pt states vision is the same   Referring physician: Leticia Clas, [address] [place],  [place] [address]  HISTORICAL INFORMATION:   Selected notes from the MEDICAL RECORD NUMBER Referred by Dr. Leander Rams for concern of ARMD OD with progression to CNVM vs CSR   CURRENT MEDICATIONS: Current Outpatient Medications (Ophthalmic Drugs)  Medication Sig   ARTIFICIAL TEAR SOLUTION OP Place 1 drop into both eyes daily as needed (dry eyes).   Current Facility-Administered Medications (Ophthalmic Drugs)  Medication Route   aflibercept (EYLEA) SOLN 2 mg Intravitreal   aflibercept (EYLEA) SOLN 2 mg Intravitreal   aflibercept (EYLEA) SOLN 2 mg Intravitreal   Current Outpatient Medications (Other)  Medication Sig   atorvastatin (LIPITOR) 10 MG tablet Take 10 mg by mouth at bedtime.    Cholecalciferol (VITAMIN D) 50 MCG (2000 UT) tablet Take 4,000 Units by mouth at bedtime.    Cyanocobalamin (B-12 PO) Take 1 Dose by mouth 2 (two) times a week.   levothyroxine (SYNTHROID, LEVOTHROID) 100 MCG tablet Take 100 mcg by mouth daily before breakfast.   losartan (COZAAR) 100 MG tablet Take 100 mg by mouth daily.    Melatonin 10 MG CAPS Take 10 mg by mouth at bedtime.   Multiple Vitamins-Minerals (PRESERVISION AREDS 2 PO)  Take 1 tablet by mouth at bedtime.    sertraline (ZOLOFT) 50 MG tablet Take 50 mg by mouth daily.   sodium chloride (OCEAN) 0.65 % SOLN nasal spray Place 1 spray into both nostrils as needed for congestion.   acidophilus (RISAQUAD) CAPS capsule Take 1 capsule by mouth daily. (Patient not taking: Reported on 02/16/2021)   fluticasone (FLONASE) 50 MCG/ACT nasal spray Place 1 spray into both nostrils daily as needed for allergies or rhinitis. (Patient not taking: Reported on 02/16/2021)   hydrochlorothiazide (HYDRODIURIL) 25 MG tablet Take 25 mg by mouth daily. 1/2 tablet am, 1/2 tablet pm (Patient not taking: No sig reported)   loratadine (CLARITIN) 10 MG tablet Take 10 mg by mouth daily as needed for allergies.  (Patient not taking: Reported on 02/16/2021)   Current Facility-Administered Medications (Other)  Medication Route   Bevacizumab (AVASTIN) SOLN 1.25 mg Intravitreal   Bevacizumab (AVASTIN) SOLN 1.25 mg Intravitreal   Bevacizumab (AVASTIN) SOLN 1.25 mg Intravitreal      REVIEW OF SYSTEMS: ROS   Positive for: Genitourinary, Musculoskeletal, Endocrine, Eyes, Psychiatric, Allergic/Imm Negative for: Constitutional, Gastrointestinal, Neurological, Skin, HENT, Cardiovascular, Respiratory, Heme/Lymph Last edited by Leonie Douglas, COA on 02/16/2021  1:19 PM.        ALLERGIES Allergies  Allergen Reactions   Trilyte [Peg 3350-Kcl-Na Bicarb-Nacl]  HEMATEMESIS   Vicodin [Hydrocodone-Acetaminophen] Nausea And Vomiting    PAST MEDICAL HISTORY Past Medical History:  Diagnosis Date   Anxiety and depression    Cataract    Combined forms OU   Hypercholesterolemia    Hypertension    Hypertensive retinopathy    OU   Hypothyroidism    Macular degeneration    OD   Osteoarthritis    Seasonal allergies    Past Surgical History:  Procedure Laterality Date   BIOPSY  10/01/2018   Procedure: BIOPSY;  Surgeon: Danie Binder, MD;  [facility];  Service: Endoscopy;;  gastric  bx & gastric polyp   COLONOSCOPY N/A 10/01/2018   Procedure: COLONOSCOPY;  Surgeon: Danie Binder, MD;  [facility]: [facility];  Service: Endoscopy;  Laterality: N/A;  10:30am   ESOPHAGOGASTRODUODENOSCOPY N/A 10/01/2018   Procedure: ESOPHAGOGASTRODUODENOSCOPY (EGD);  Surgeon: Danie Binder, MD;  [facility]: [facility];  Service: Endoscopy;  Laterality: N/A;   POLYPECTOMY  10/01/2018   Procedure: POLYPECTOMY;  Surgeon: Danie Binder, MD;  [facility]: [facility];  Service: Endoscopy;;  colon    TUBAL LIGATION     UTERINE FIBROID SURGERY      FAMILY HISTORY Family History  Problem Relation Age of Onset   Cancer Mother        lung   Stroke Father    Stroke Brother    Colon cancer Neg Hx     SOCIAL HISTORY Social History   Tobacco Use   Smoking status: Former    Pack years: 0.00    Types: Cigarettes   Smokeless tobacco: Never  Vaping Use   Vaping Use: Never used  Substance Use Topics   Alcohol use: No   Drug use: No         OPHTHALMIC EXAM:  Base Eye Exam     Visual Acuity (Snellen - Linear)       Right Left   Dist cc 20/40 20/20   Dist ph cc 20/40 +1          Tonometry (Tonopen, 1:25 PM)       Right Left   Pressure 12 12         Pupils       Dark Light Shape React APD   Right 3 2 Round Brisk None   Left 3 2 Round Brisk None         Visual Fields (Counting fingers)       Left Right    Full Full         Extraocular Movement       Right Left    Full Full         Neuro/Psych     Oriented x3: Yes   Mood/Affect: Normal         Dilation     Right eye: 1.0% Mydriacyl, 2.5% Phenylephrine @ 1:25 PM  Dilating OD only per patients request.           Slit Lamp and Fundus Exam     Slit Lamp Exam       Right Left   Lids/Lashes Dermatochalasis - upper lid Dermatochalasis - upper lid   Conjunctiva/Sclera White and quiet White and quiet   Cornea Arcus, early nasal band K, trace PEE Arcus, early nasal band K, 1+PEE   Anterior  Chamber moderate depth, with narrow angle  moderate depth, with narrow angle temporally   Iris Round and well dilated Round and well dilated  Lens 2-3+ Nuclear sclerosis, 2-3+ Cortical cataract 2+ Nuclear sclerosis, 2+ Cortical cataract   Vitreous Vitreous syneresis, Posterior vitreous detachment, Weiss ring Vitreous syneresis         Fundus Exam       Right Left   Disc Pink and Sharp, Compact Pink and Sharp, Tilted disc   C/D Ratio 0.3 0.4   Macula blunted foveal reflex; central SRF - stably resolved; +drusen; +RPE mottling and clumping, no heme or edema Flat, good foveal reflex, Retinal pigment epithelial mottling, superior para-foveal focal area of RPE atrophy -- stable, rare drusen, No heme or edema   Vessels Vascular attenuation, Tortuous Vascular attenuation, Tortuous   Periphery Attached, peripheral drusen / RPE changes nasally, peripheral cystoid degeneration Attached, very mild lattice at 0430, mild patch of Lattice degeneration at 0600 -- stable, scattered peripheral drusen, peripheral cystoid degeneration           Refraction     Wearing Rx       Sphere Cylinder Axis Add   Right +2.00 +1.25 158 +2.50   Left +2.00 +0.75 002 +2.50            IMAGING AND PROCEDURES  Imaging and Procedures for 12/11/17  OCT, Retina - OU - Both Eyes       Right Eye Quality was good. Central Foveal Thickness: 253. Progression has been stable. Findings include no IRF, retinal drusen , normal foveal contour, no SRF (Stable improvement in SRF).   Left Eye Quality was good. Central Foveal Thickness: 278. Progression has been stable. Findings include normal foveal contour, no IRF, no SRF, retinal drusen .   Notes *Images captured and stored on drive  Diagnosis / Impression:  OD: Stable improvement in SRF OS: NFP, No IRF/SRF  Clinical management:  See below  Abbreviations: NFP - Normal foveal profile. CME - cystoid macular edema. PED - pigment epithelial detachment. IRF -  intraretinal fluid. SRF - subretinal fluid. EZ - ellipsoid zone. ERM - epiretinal membrane. ORA - outer retinal atrophy. ORT - outer retinal tubulation. SRHM - subretinal hyper-reflective material       Intravitreal Injection, Pharmacologic Agent - OD - Right Eye       Time Out 02/16/2021. 2:14 PM. Confirmed correct patient, procedure, site, and patient consented.   Anesthesia Topical anesthesia was used. Anesthetic medications included Lidocaine 2%, Proparacaine 0.5%.   Procedure Preparation included 5% betadine to ocular surface, eyelid speculum. A (32g) needle was used.   Injection: 2 mg aflibercept 2 MG/0.05ML   Route: Intravitreal, Site: Right Eye   NDC: [id number], Lot: [id number], Expiration date: 10/26/2021, Waste: 0.05 mL   Post-op Post injection exam found visual acuity of at least counting fingers. The patient tolerated the procedure well. There were no complications. The patient received written and verbal post procedure care education. Post injection medications were not given.               ASSESSMENT/PLAN:    ICD-10-CM   1. Exudative age-related macular degeneration of right eye with active choroidal neovascularization (HCC)  H35.3211 Intravitreal Injection, Pharmacologic Agent - OD - Right Eye    aflibercept (EYLEA) SOLN 2 mg    2. 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 Retinal edema  H35.81 OCT, Retina - OU - Both Eyes    4. Essential hypertension  I10     5. Hypertensive retinopathy of both eyes  H35.033     6. Lattice degeneration of left retina  H35.412     7. Posterior vitreous detachment of right eye  H43.811     8. Combined forms of age-related cataract of both eyes  H25.813       1-3. Exudative age related macular degeneration vs CSCR OD  - Prior history of worsening fluid when delayed f/u to 6 wks    - FA (03.02.19) without leakage / pooling into area of SRF  - review of systems reveals significant stressors in  life, but no exogenous steroid use  - discussed treatment with anti-VEGF vs po Eplerenone  - S/P IVA #1 OD (09.27.19),  #2 (10.25.19), #3 (11.22.19) -- minimal response  - pt approved for [employer] and IVE  - S/P IVE OD #1 (12.20.19), #2 (01.20.20), #3 (02.18.20), #4 (07.20.20) #5 (09.01.20), #6 (10.13.20), #7 (11.20.20), #8 (01.20.21), #9 (02.17.21), #10 (03.19.21), #11 (04.20.21), #12 (05.25.21), #13 (6.23.21), #14 (07.27.21), #15 (08.30.21), #16 (9.28.21), #17 (10.26.21), #18 (11.29.21), #19 (01.11.22), #20 (02.08.22), #21 (3.8.22), #22 (04.05.22), #23 (5.10.22)  - lost to f/u from Feb 2020 to July 2020 due to COVID-19 concerns  - today, VA stable at 20/40 OD -- ?new baseline  - OCT today shows stable resolution of SRF at 6 weeks  - recommend IVE OD #24 today, 06.21.22 -- maintenance w/ f/u in 6 wks again  - pt wishes to proceed with IVE OD  - RBA of procedure discussed, questions answered  - informed consent obtained   - Eylea informed consent form signed and scanned on 12.22.2020  - see procedure note  - [email or web address] benefits investigation initiated -- approved for 2022  - f/u 6 weeks -- DFE/OCT/ possible injection -- possible treat and extend  4,5. Hypertensive retinopathy OU  - discussed importance of tight BP control  - stable  - monitor  6. Lattice degeneration OS-  - patch of inferior lattice OS -- no RT, holes or SRF  - asymptomatic  - discussed findings, prognosis, and treatment options including observation  - monitor  7. PVD / vitreous syneresis OD-   - Discussed findings and prognosis  - No RT or RD on 360 peripheral exam  - Reviewed s/s of RT/RD  - Strict return precautions for any such RT/RD signs/symptoms  8. Combined form age-related cataract OU-   - The symptoms of cataract, surgical options, and treatments and risks were discussed with patient.  - discussed diagnosis and progression  - not yet visually significant  - monitor for now  Ophthalmic Meds Ordered  this visit:  Meds ordered this encounter  Medications   aflibercept (EYLEA) SOLN 2 mg       Return in about 6 weeks (around 03/30/2021) for f/u exu ARMD OD, DFE, OCT.  There are no Patient Instructions on file for this visit.   This document serves as a record of services personally performed by Gardiner Sleeper, MD, PhD. It was created on their behalf by Estill Bakes, COT an ophthalmic technician. The creation of this record is the provider's dictation and/or activities during the visit.    Electronically signed by: Estill Bakes, COT 6.17.22 @ 11:28 PM   This document serves as a record of services personally performed by Gardiner Sleeper, MD, PhD. It was created on their behalf by San Jetty. Owens Shark, OA an ophthalmic technician. The creation of this record is the provider's dictation and/or activities during the visit.    Electronically signed by: San Jetty. Owens Shark, [place] 06.21.2022 11:28 PM  Gardiner Sleeper, M.D., Ph.D. Diseases & Surgery of  the Retina and Vitreous [facility]  I have reviewed the above documentation for accuracy and completeness, and I agree with the above. Gardiner Sleeper, M.D., Ph.D. 02/16/21 11:29 PM   Abbreviations: M myopia (nearsighted); A astigmatism; H hyperopia (farsighted); P presbyopia; Mrx spectacle prescription;  CTL contact lenses; OD right eye; OS left eye; OU both eyes  XT exotropia; ET esotropia; PEK punctate epithelial keratitis; PEE punctate epithelial erosions; DES dry eye syndrome; MGD meibomian gland dysfunction; ATs artificial tears; PFAT's preservative free artificial tears; [place] nuclear sclerotic cataract; PSC posterior subcapsular cataract; ERM epi-retinal membrane; PVD posterior vitreous detachment; RD retinal detachment; DM diabetes mellitus; DR diabetic retinopathy; NPDR non-proliferative diabetic retinopathy; PDR proliferative diabetic retinopathy; CSME clinically significant macular edema; DME diabetic macular edema; dbh dot  blot hemorrhages; CWS cotton wool spot; POAG primary open angle glaucoma; C/D cup-to-disc ratio; HVF humphrey visual field; GVF goldmann visual field; OCT optical coherence tomography; IOP intraocular pressure; BRVO Branch retinal vein occlusion; CRVO central retinal vein occlusion; CRAO central retinal artery occlusion; BRAO branch retinal artery occlusion; RT retinal tear; SB scleral buckle; PPV pars plana vitrectomy; VH Vitreous hemorrhage; PRP panretinal laser photocoagulation; IVK intravitreal kenalog; VMT vitreomacular traction; MH Macular hole;  NVD neovascularization of the disc; NVE neovascularization elsewhere; AREDS age related eye disease study; ARMD age related macular degeneration; POAG primary open angle glaucoma; EBMD epithelial/anterior basement membrane dystrophy; ACIOL anterior chamber intraocular lens; IOL intraocular lens; PCIOL posterior chamber intraocular lens; Phaco/IOL phacoemulsification with intraocular lens placement; [place] photorefractive keratectomy; LASIK laser assisted in situ keratomileusis; HTN hypertension; DM diabetes mellitus; COPD chronic obstructive pulmonary disease

## 2021-02-16 ENCOUNTER — Ambulatory Visit (INDEPENDENT_AMBULATORY_CARE_PROVIDER_SITE_OTHER): Payer: Medicare HMO | Admitting: Ophthalmology

## 2021-02-16 ENCOUNTER — Encounter (INDEPENDENT_AMBULATORY_CARE_PROVIDER_SITE_OTHER): Payer: Self-pay | Admitting: Ophthalmology

## 2021-02-16 ENCOUNTER — Other Ambulatory Visit: Payer: Self-pay

## 2021-02-16 DIAGNOSIS — H3581 Retinal edema: Secondary | ICD-10-CM | POA: Diagnosis not present

## 2021-02-16 DIAGNOSIS — H25813 Combined forms of age-related cataract, bilateral: Secondary | ICD-10-CM | POA: Diagnosis not present

## 2021-02-16 DIAGNOSIS — H353211 Exudative age-related macular degeneration, right eye, with active choroidal neovascularization: Secondary | ICD-10-CM

## 2021-02-16 DIAGNOSIS — H43811 Vitreous degeneration, right eye: Secondary | ICD-10-CM

## 2021-02-16 DIAGNOSIS — H35711 Central serous chorioretinopathy, right eye: Secondary | ICD-10-CM | POA: Diagnosis not present

## 2021-02-16 DIAGNOSIS — H35412 Lattice degeneration of retina, left eye: Secondary | ICD-10-CM

## 2021-02-16 DIAGNOSIS — H35033 Hypertensive retinopathy, bilateral: Secondary | ICD-10-CM | POA: Diagnosis not present

## 2021-02-16 DIAGNOSIS — I10 Essential (primary) hypertension: Secondary | ICD-10-CM

## 2021-02-16 MED ORDER — AFLIBERCEPT 2MG/0.05ML IZ SOLN FOR KALEIDOSCOPE
2.0000 mg | INTRAVITREAL | Status: AC | PRN
  Administered 2021-02-16: 2 mg via INTRAVITREAL

## 2021-02-25 DIAGNOSIS — I10 Essential (primary) hypertension: Secondary | ICD-10-CM | POA: Diagnosis not present

## 2021-02-25 DIAGNOSIS — E7849 Other hyperlipidemia: Secondary | ICD-10-CM | POA: Diagnosis not present

## 2021-03-11 DIAGNOSIS — I10 Essential (primary) hypertension: Secondary | ICD-10-CM | POA: Diagnosis not present

## 2021-03-11 DIAGNOSIS — J069 Acute upper respiratory infection, unspecified: Secondary | ICD-10-CM | POA: Diagnosis not present

## 2021-03-11 DIAGNOSIS — N1831 Chronic kidney disease, stage 3a: Secondary | ICD-10-CM | POA: Diagnosis not present

## 2021-03-11 DIAGNOSIS — H109 Unspecified conjunctivitis: Secondary | ICD-10-CM | POA: Diagnosis not present

## 2021-03-11 DIAGNOSIS — Z299 Encounter for prophylactic measures, unspecified: Secondary | ICD-10-CM | POA: Diagnosis not present

## 2021-03-30 ENCOUNTER — Other Ambulatory Visit: Payer: Self-pay

## 2021-03-30 ENCOUNTER — Encounter (INDEPENDENT_AMBULATORY_CARE_PROVIDER_SITE_OTHER): Payer: Self-pay | Admitting: Ophthalmology

## 2021-03-30 ENCOUNTER — Ambulatory Visit (INDEPENDENT_AMBULATORY_CARE_PROVIDER_SITE_OTHER): Payer: Medicare HMO | Admitting: Ophthalmology

## 2021-03-30 DIAGNOSIS — H25813 Combined forms of age-related cataract, bilateral: Secondary | ICD-10-CM

## 2021-03-30 DIAGNOSIS — H353211 Exudative age-related macular degeneration, right eye, with active choroidal neovascularization: Secondary | ICD-10-CM

## 2021-03-30 DIAGNOSIS — H35412 Lattice degeneration of retina, left eye: Secondary | ICD-10-CM | POA: Diagnosis not present

## 2021-03-30 DIAGNOSIS — H35711 Central serous chorioretinopathy, right eye: Secondary | ICD-10-CM | POA: Diagnosis not present

## 2021-03-30 DIAGNOSIS — I10 Essential (primary) hypertension: Secondary | ICD-10-CM | POA: Diagnosis not present

## 2021-03-30 DIAGNOSIS — H3581 Retinal edema: Secondary | ICD-10-CM | POA: Diagnosis not present

## 2021-03-30 DIAGNOSIS — H35033 Hypertensive retinopathy, bilateral: Secondary | ICD-10-CM

## 2021-03-30 DIAGNOSIS — H43811 Vitreous degeneration, right eye: Secondary | ICD-10-CM

## 2021-03-30 NOTE — Progress Notes (Signed)
[facility] Note  03/30/2021      CHIEF COMPLAINT Patient presents for Retina Follow Up   HISTORY OF PRESENT ILLNESS: Darlene Crawford is a 80 y.o. female who presents to the clinic today for:  HPI     Retina Follow Up   Patient presents with  Wet AMD.  In right eye.  Duration of 6 weeks.  Since onset it is stable.  I, the attending physician,  performed the HPI with the patient and updated documentation appropriately.        Comments   Pt here for 6 wk ret f/u exu ARMD OD. Pt states vision is about the same, no changes noted. No ocular pain or discomfort.       Last edited by Bernarda Caffey, MD on 03/31/2021 12:29 PM.    Pt states eyes are doing well, she is very pleased   Referring physician: Leticia Clas, [facility] [address] [place],  [place] [address]  HISTORICAL INFORMATION:   Selected notes from the MEDICAL RECORD NUMBER Referred by Dr. Leander Rams for concern of ARMD OD with progression to CNVM vs CSR   CURRENT MEDICATIONS: Current Outpatient Medications (Ophthalmic Drugs)  Medication Sig   ARTIFICIAL TEAR SOLUTION OP Place 1 drop into both eyes daily as needed (dry eyes).   Current Facility-Administered Medications (Ophthalmic Drugs)  Medication Route   aflibercept (EYLEA) SOLN 2 mg Intravitreal   aflibercept (EYLEA) SOLN 2 mg Intravitreal   aflibercept (EYLEA) SOLN 2 mg Intravitreal   Current Outpatient Medications (Other)  Medication Sig   acidophilus (RISAQUAD) CAPS capsule Take 1 capsule by mouth daily. (Patient not taking: Reported on 02/16/2021)   atorvastatin (LIPITOR) 10 MG tablet Take 10 mg by mouth at bedtime.    Cholecalciferol (VITAMIN D) 50 MCG (2000 UT) tablet Take 4,000 Units by mouth at bedtime.    Cyanocobalamin (B-12 PO) Take 1 Dose by mouth 2 (two) times a week.   fluticasone (FLONASE) 50 MCG/ACT nasal spray Place 1 spray into both nostrils daily as needed for allergies or rhinitis. (Patient not taking: Reported  on 02/16/2021)   hydrochlorothiazide (HYDRODIURIL) 25 MG tablet Take 25 mg by mouth daily. 1/2 tablet am, 1/2 tablet pm (Patient not taking: No sig reported)   levothyroxine (SYNTHROID, LEVOTHROID) 100 MCG tablet Take 100 mcg by mouth daily before breakfast.   loratadine (CLARITIN) 10 MG tablet Take 10 mg by mouth daily as needed for allergies.  (Patient not taking: Reported on 02/16/2021)   losartan (COZAAR) 100 MG tablet Take 100 mg by mouth daily.    Melatonin 10 MG CAPS Take 10 mg by mouth at bedtime.   Multiple Vitamins-Minerals (PRESERVISION AREDS 2 PO) Take 1 tablet by mouth at bedtime.    sertraline (ZOLOFT) 50 MG tablet Take 50 mg by mouth daily.   sodium chloride (OCEAN) 0.65 % SOLN nasal spray Place 1 spray into both nostrils as needed for congestion.   Current Facility-Administered Medications (Other)  Medication Route   Bevacizumab (AVASTIN) SOLN 1.25 mg Intravitreal   Bevacizumab (AVASTIN) SOLN 1.25 mg Intravitreal   Bevacizumab (AVASTIN) SOLN 1.25 mg Intravitreal      REVIEW OF SYSTEMS: ROS   Positive for: Genitourinary, Musculoskeletal, Endocrine, Eyes, Psychiatric, Allergic/Imm Negative for: Constitutional, Gastrointestinal, Neurological, Skin, HENT, Cardiovascular, Respiratory, Heme/Lymph Last edited by Kingsley Spittle, COT on 03/30/2021  1:04 PM.         ALLERGIES Allergies  Allergen Reactions   Trilyte [  Peg 3350-Kcl-Na Bicarb-Nacl]     HEMATEMESIS   Vicodin [Hydrocodone-Acetaminophen] Nausea And Vomiting    PAST MEDICAL HISTORY Past Medical History:  Diagnosis Date   Anxiety and depression    Cataract    Combined forms OU   Hypercholesterolemia    Hypertension    Hypertensive retinopathy    OU   Hypothyroidism    Macular degeneration    OD   Osteoarthritis    Seasonal allergies    Past Surgical History:  Procedure Laterality Date   BIOPSY  10/01/2018   Procedure: BIOPSY;  Surgeon: Danie Binder, MD;  [facility]: [facility];  Service:  Endoscopy;;  gastric bx & gastric polyp   COLONOSCOPY N/A 10/01/2018   Procedure: COLONOSCOPY;  Surgeon: Danie Binder, MD;  Location: [facility];  Service: Endoscopy;  Laterality: N/A;  10:30am   ESOPHAGOGASTRODUODENOSCOPY N/A 10/01/2018   Procedure: ESOPHAGOGASTRODUODENOSCOPY (EGD);  Surgeon: Danie Binder, MD;  Location: [facility];  Service: Endoscopy;  Laterality: N/A;   POLYPECTOMY  10/01/2018   Procedure: POLYPECTOMY;  Surgeon: Danie Binder, MD;  Location: [facility];  Service: Endoscopy;;  colon    TUBAL LIGATION     UTERINE FIBROID SURGERY      FAMILY HISTORY Family History  Problem Relation Age of Onset   Cancer Mother        lung   Stroke Father    Stroke Brother    Colon cancer Neg Hx     SOCIAL HISTORY Social History   Tobacco Use   Smoking status: Former    Types: Cigarettes   Smokeless tobacco: Never  Vaping Use   Vaping Use: Never used  Substance Use Topics   Alcohol use: No   Drug use: No         OPHTHALMIC EXAM:  Base Eye Exam     Visual Acuity (Snellen - Linear)       Right Left   Dist cc 20/30 -2 20/20   Dist ph cc NI     Correction: Glasses         Tonometry (Tonopen, 1:11 PM)       Right Left   Pressure 13 13         Pupils       Dark Light Shape React APD   Right 3 2 Round Brisk None   Left 3 2 Round Brisk None         Visual Fields (Counting fingers)       Left Right    Full Full         Extraocular Movement       Right Left    Full, Ortho Full, Ortho         Neuro/Psych     Oriented x3: Yes   Mood/Affect: Normal         Dilation     Right eye: 1.0% Mydriacyl, 2.5% Phenylephrine @ 1:12 PM  Deferred OS dilation per pt request MS          Slit Lamp and Fundus Exam     Slit Lamp Exam       Right Left   Lids/Lashes Dermatochalasis - upper lid Dermatochalasis - upper lid   Conjunctiva/Sclera White and quiet White and quiet   Cornea Arcus, early nasal band K, trace PEE Arcus,  early nasal band K, 1+PEE   Anterior Chamber moderate depth, with narrow angle  moderate depth, with narrow angle temporally   Iris Round  and well dilated Round and well dilated   Lens 2-3+ Nuclear sclerosis, 2-3+ Cortical cataract 2+ Nuclear sclerosis, 2+ Cortical cataract   Vitreous Vitreous syneresis, Posterior vitreous detachment, Weiss ring Vitreous syneresis         Fundus Exam       Right Left   Disc Pink and Sharp, Compact Pink and Sharp, Tilted disc   C/D Ratio 0.3 0.4   Macula Flat, blunted foveal reflex; central SRF - stably resolved; +drusen; +RPE mottling and clumping, small cluster of drusen and RPE mottling inferior fovea, no heme or edema Flat, good foveal reflex, Retinal pigment epithelial mottling, superior para-foveal focal area of RPE atrophy -- stable, rare drusen, No heme or edema   Vessels Vascular attenuation, Tortuous Vascular attenuation, Tortuous   Periphery Attached, peripheral drusen / RPE changes nasally, peripheral cystoid degeneration Attached, very mild lattice at 0430, mild patch of Lattice degeneration at 0600 -- stable, scattered peripheral drusen, peripheral cystoid degeneration           Refraction     Wearing Rx       Sphere Cylinder Axis Add   Right +2.00 +1.25 158 +2.50   Left +2.00 +0.75 002 +2.50            IMAGING AND PROCEDURES  Imaging and Procedures for 12/11/17  OCT, Retina - OU - Both Eyes       Right Eye Quality was good. Central Foveal Thickness: 250. Progression has been stable. Findings include no IRF, retinal drusen , normal foveal contour, no SRF (Stable improvement in SRF).   Left Eye Quality was good. Central Foveal Thickness: 275. Progression has been stable. Findings include normal foveal contour, no IRF, no SRF, retinal drusen .   Notes *Images captured and stored on drive  Diagnosis / Impression:  OD: Stable improvement in SRF OS: NFP, No IRF/SRF  Clinical management:  See below  Abbreviations: NFP  - Normal foveal profile. CME - cystoid macular edema. PED - pigment epithelial detachment. IRF - intraretinal fluid. SRF - subretinal fluid. EZ - ellipsoid zone. ERM - epiretinal membrane. ORA - outer retinal atrophy. ORT - outer retinal tubulation. SRHM - subretinal hyper-reflective material       Intravitreal Injection, Pharmacologic Agent - OD - Right Eye       Time Out 03/30/2021. 1:28 PM. Confirmed correct patient, procedure, site, and patient consented.   Anesthesia Topical anesthesia was used. Anesthetic medications included Lidocaine 2%, Proparacaine 0.5%.   Procedure Preparation included 5% betadine to ocular surface, eyelid speculum. A (32g) needle was used.   Injection: 2 mg aflibercept 2 MG/0.05ML   Route: Intravitreal, Site: Right Eye   NDC: A3590391, Lot: [id number], Expiration date: 01/26/2022, Waste: 0.05 mL   Post-op Post injection exam found visual acuity of at least counting fingers. The patient tolerated the procedure well. There were no complications. The patient received written and verbal post procedure care education. Post injection medications were not given.            ASSESSMENT/PLAN:    ICD-10-CM   1. Exudative age-related macular degeneration of right eye with active choroidal neovascularization (HCC)  H35.3211 Intravitreal Injection, Pharmacologic Agent - OD - Right Eye    aflibercept (EYLEA) SOLN 2 mg    2. 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 Retinal edema  H35.81 OCT, Retina - OU - Both Eyes    4. Essential hypertension  I10     5. Hypertensive retinopathy  of both eyes  H35.033     6. Lattice degeneration of left retina  H35.412     7. Posterior vitreous detachment of right eye  H43.811     8. Combined forms of age-related cataract of both eyes  H25.813       1-3. Exudative age related macular degeneration vs CSCR OD  - Prior history of worsening fluid when delayed f/u to 6 wks    - FA (03.02.19) without  leakage / pooling into area of SRF  - review of systems reveals significant stressors in life, but no exogenous steroid use  - discussed treatment with anti-VEGF vs po Eplerenone  - S/P IVA #1 OD (09.27.19),  #2 (10.25.19), #3 (11.22.19) -- minimal response  - pt approved for [employer] and IVE  - S/P IVE OD #1 (12.20.19), #2 (01.20.20), #3 (02.18.20), #4 (07.20.20) #5 (09.01.20), #6 (10.13.20), #7 (11.20.20), #8 (01.20.21), #9 (02.17.21), #10 (03.19.21), #11 (04.20.21), #12 (05.25.21), #13 (6.23.21), #14 (07.27.21), #15 (08.30.21), #16 (9.28.21), #17 (10.26.21), #18 (11.29.21), #19 (01.11.22), #20 (02.08.22), #21 (3.8.22), #22 (04.05.22), #23 (5.10.22), #24 (06.21.22)  - lost to f/u from Feb 2020 to July 2020 due to COVID-19 concerns  - today, VA stable at 20/30 OD -- ?new baseline  - OCT today shows stable resolution of SRF at 6 weeks  - recommend IVE OD #25 today, 08.02.22 -- maintenance w/ f/u in 7 wks  - pt wishes to proceed with IVE OD  - RBA of procedure discussed, questions answered  - informed consent obtained   - Eylea informed consent form signed and scanned on 12.22.2020  - see procedure note  - [email or web address] benefits investigation initiated -- approved for 2022  - f/u 7 weeks -- DFE/OCT/ possible injection -- slow treat and extend as able  4,5. Hypertensive retinopathy OU  - discussed importance of tight BP control  - stable  - monitor  6. Lattice degeneration OS-  - patch of inferior lattice OS -- no RT, holes or SRF  - asymptomatic  - discussed findings, prognosis, and treatment options including observation  - monitor  7. PVD / vitreous syneresis OD-   - Discussed findings and prognosis  - No RT or RD on 360 peripheral exam  - Reviewed s/s of RT/RD  - Strict return precautions for any such RT/RD signs/symptoms  8. Combined form age-related cataract OU-   - The symptoms of cataract, surgical options, and treatments and risks were discussed with patient.  - discussed  diagnosis and progression  - not yet visually significant  - monitor for now  Ophthalmic Meds Ordered this visit:  Meds ordered this encounter  Medications   aflibercept (EYLEA) SOLN 2 mg       Return in about 7 weeks (around 05/18/2021) for f/u exu ARMD OD, DFE, OCT.  There are no Patient Instructions on file for this visit.   This document serves as a record of services personally performed by Gardiner Sleeper, MD, PhD. It was created on their behalf by Estill Bakes, COT an ophthalmic technician. The creation of this record is the provider's dictation and/or activities during the visit.    Electronically signed by: Estill Bakes, COT 8.2.22 @ 12:36 PM   This document serves as a record of services personally performed by Gardiner Sleeper, MD, PhD. It was created on their behalf by San Jetty. Owens Shark, OA an ophthalmic technician. The creation of this record is the provider's dictation and/or activities during the visit.    Electronically  signed by: San Jetty. Marguerita Merles 08.02.2022 12:36 PM   Gardiner Sleeper, M.D., Ph.D. Diseases & Surgery of the Retina and Vitreous [facility]  I have reviewed the above documentation for accuracy and completeness, and I agree with the above. Gardiner Sleeper, M.D., Ph.D. 03/31/21 12:36 PM  Abbreviations: M myopia (nearsighted); A astigmatism; H hyperopia (farsighted); P presbyopia; Mrx spectacle prescription;  CTL contact lenses; OD right eye; OS left eye; OU both eyes  XT exotropia; ET esotropia; PEK punctate epithelial keratitis; PEE punctate epithelial erosions; DES dry eye syndrome; MGD meibomian gland dysfunction; ATs artificial tears; PFAT's preservative free artificial tears; [place] nuclear sclerotic cataract; PSC posterior subcapsular cataract; ERM epi-retinal membrane; PVD posterior vitreous detachment; RD retinal detachment; DM diabetes mellitus; DR diabetic retinopathy; NPDR non-proliferative diabetic retinopathy; PDR  proliferative diabetic retinopathy; CSME clinically significant macular edema; DME diabetic macular edema; dbh dot blot hemorrhages; CWS cotton wool spot; POAG primary open angle glaucoma; C/D cup-to-disc ratio; HVF humphrey visual field; GVF goldmann visual field; OCT optical coherence tomography; IOP intraocular pressure; BRVO Branch retinal vein occlusion; CRVO central retinal vein occlusion; CRAO central retinal artery occlusion; BRAO branch retinal artery occlusion; RT retinal tear; SB scleral buckle; PPV pars plana vitrectomy; VH Vitreous hemorrhage; PRP panretinal laser photocoagulation; IVK intravitreal kenalog; VMT vitreomacular traction; MH Macular hole;  NVD neovascularization of the disc; NVE neovascularization elsewhere; AREDS age related eye disease study; ARMD age related macular degeneration; POAG primary open angle glaucoma; EBMD epithelial/anterior basement membrane dystrophy; ACIOL anterior chamber intraocular lens; IOL intraocular lens; PCIOL posterior chamber intraocular lens; Phaco/IOL phacoemulsification with intraocular lens placement; [place] photorefractive keratectomy; LASIK laser assisted in situ keratomileusis; HTN hypertension; DM diabetes mellitus; COPD chronic obstructive pulmonary disease

## 2021-03-31 ENCOUNTER — Encounter (INDEPENDENT_AMBULATORY_CARE_PROVIDER_SITE_OTHER): Payer: Self-pay | Admitting: Ophthalmology

## 2021-03-31 MED ORDER — AFLIBERCEPT 2MG/0.05ML IZ SOLN FOR KALEIDOSCOPE
2.0000 mg | INTRAVITREAL | Status: AC | PRN
  Administered 2021-03-30: 2 mg via INTRAVITREAL

## 2021-04-13 DIAGNOSIS — Z1339 Encounter for screening examination for other mental health and behavioral disorders: Secondary | ICD-10-CM | POA: Diagnosis not present

## 2021-04-13 DIAGNOSIS — Z0000 Encounter for general adult medical examination without abnormal findings: Secondary | ICD-10-CM | POA: Diagnosis not present

## 2021-04-13 DIAGNOSIS — Z1331 Encounter for screening for depression: Secondary | ICD-10-CM | POA: Diagnosis not present

## 2021-04-13 DIAGNOSIS — Z6833 Body mass index (BMI) 33.0-33.9, adult: Secondary | ICD-10-CM | POA: Diagnosis not present

## 2021-04-13 DIAGNOSIS — Z79899 Other long term (current) drug therapy: Secondary | ICD-10-CM | POA: Diagnosis not present

## 2021-04-13 DIAGNOSIS — Z7189 Other specified counseling: Secondary | ICD-10-CM | POA: Diagnosis not present

## 2021-04-13 DIAGNOSIS — E7800 Pure hypercholesterolemia, unspecified: Secondary | ICD-10-CM | POA: Diagnosis not present

## 2021-04-13 DIAGNOSIS — Z299 Encounter for prophylactic measures, unspecified: Secondary | ICD-10-CM | POA: Diagnosis not present

## 2021-04-13 DIAGNOSIS — I10 Essential (primary) hypertension: Secondary | ICD-10-CM | POA: Diagnosis not present

## 2021-04-13 DIAGNOSIS — E039 Hypothyroidism, unspecified: Secondary | ICD-10-CM | POA: Diagnosis not present

## 2021-05-18 ENCOUNTER — Ambulatory Visit (INDEPENDENT_AMBULATORY_CARE_PROVIDER_SITE_OTHER): Payer: Medicare HMO | Admitting: Ophthalmology

## 2021-05-18 ENCOUNTER — Encounter (INDEPENDENT_AMBULATORY_CARE_PROVIDER_SITE_OTHER): Payer: Self-pay | Admitting: Ophthalmology

## 2021-05-18 ENCOUNTER — Other Ambulatory Visit: Payer: Self-pay

## 2021-05-18 DIAGNOSIS — H35033 Hypertensive retinopathy, bilateral: Secondary | ICD-10-CM

## 2021-05-18 DIAGNOSIS — I10 Essential (primary) hypertension: Secondary | ICD-10-CM

## 2021-05-18 DIAGNOSIS — H43811 Vitreous degeneration, right eye: Secondary | ICD-10-CM | POA: Diagnosis not present

## 2021-05-18 DIAGNOSIS — H353211 Exudative age-related macular degeneration, right eye, with active choroidal neovascularization: Secondary | ICD-10-CM

## 2021-05-18 DIAGNOSIS — H35711 Central serous chorioretinopathy, right eye: Secondary | ICD-10-CM

## 2021-05-18 DIAGNOSIS — H25813 Combined forms of age-related cataract, bilateral: Secondary | ICD-10-CM

## 2021-05-18 DIAGNOSIS — H35412 Lattice degeneration of retina, left eye: Secondary | ICD-10-CM | POA: Diagnosis not present

## 2021-05-18 DIAGNOSIS — H3581 Retinal edema: Secondary | ICD-10-CM

## 2021-05-18 NOTE — Progress Notes (Signed)
[facility] Note  05/18/2021      CHIEF COMPLAINT Patient presents for Retina Follow Up   HISTORY OF PRESENT ILLNESS: Darlene Crawford is a 80 y.o. female who presents to the clinic today for:  HPI     Retina Follow Up   Patient presents with  Wet AMD.  In right eye.  Severity is mild.  Duration of 7 weeks.  Since onset it is stable.  I, the attending physician,  performed the HPI with the patient and updated documentation appropriately.        Comments   Pt here for 7 week ret f/u exu ARMD OD. Pt states vision is the same, no changes or issues. No ocular pain or discomfort.       Last edited by Bernarda Caffey, MD on 05/19/2021  4:25 PM.    Pt states vision is stable, no new health concerns  Referring physician: Leticia Clas, [address] [place],  [place] [address]  HISTORICAL INFORMATION:   Selected notes from the MEDICAL RECORD NUMBER Referred by Dr. Leander Rams for concern of ARMD OD with progression to CNVM vs CSR   CURRENT MEDICATIONS: Current Outpatient Medications (Ophthalmic Drugs)  Medication Sig   ARTIFICIAL TEAR SOLUTION OP Place 1 drop into both eyes daily as needed (dry eyes).   Current Facility-Administered Medications (Ophthalmic Drugs)  Medication Route   aflibercept (EYLEA) SOLN 2 mg Intravitreal   aflibercept (EYLEA) SOLN 2 mg Intravitreal   aflibercept (EYLEA) SOLN 2 mg Intravitreal   Current Outpatient Medications (Other)  Medication Sig   acidophilus (RISAQUAD) CAPS capsule Take 1 capsule by mouth daily. (Patient not taking: Reported on 02/16/2021)   atorvastatin (LIPITOR) 10 MG tablet Take 10 mg by mouth at bedtime.    Cholecalciferol (VITAMIN D) 50 MCG (2000 UT) tablet Take 4,000 Units by mouth at bedtime.    Cyanocobalamin (B-12 PO) Take 1 Dose by mouth 2 (two) times a week.   fluticasone (FLONASE) 50 MCG/ACT nasal spray Place 1 spray into both nostrils daily as needed for allergies or rhinitis. (Patient  not taking: Reported on 02/16/2021)   hydrochlorothiazide (HYDRODIURIL) 25 MG tablet Take 25 mg by mouth daily. 1/2 tablet am, 1/2 tablet pm (Patient not taking: No sig reported)   levothyroxine (SYNTHROID, LEVOTHROID) 100 MCG tablet Take 100 mcg by mouth daily before breakfast.   loratadine (CLARITIN) 10 MG tablet Take 10 mg by mouth daily as needed for allergies.  (Patient not taking: Reported on 02/16/2021)   losartan (COZAAR) 100 MG tablet Take 100 mg by mouth daily.    Melatonin 10 MG CAPS Take 10 mg by mouth at bedtime.   Multiple Vitamins-Minerals (PRESERVISION AREDS 2 PO) Take 1 tablet by mouth at bedtime.    sertraline (ZOLOFT) 50 MG tablet Take 50 mg by mouth daily.   sodium chloride (OCEAN) 0.65 % SOLN nasal spray Place 1 spray into both nostrils as needed for congestion.   Current Facility-Administered Medications (Other)  Medication Route   Bevacizumab (AVASTIN) SOLN 1.25 mg Intravitreal   Bevacizumab (AVASTIN) SOLN 1.25 mg Intravitreal   Bevacizumab (AVASTIN) SOLN 1.25 mg Intravitreal   REVIEW OF SYSTEMS: ROS   Positive for: Genitourinary, Musculoskeletal, Endocrine, Eyes, Psychiatric, Allergic/Imm Negative for: Constitutional, Gastrointestinal, Neurological, Skin, HENT, Cardiovascular, Respiratory, Heme/Lymph Last edited by Kingsley Spittle, COT on 05/18/2021  1:13 PM.      ALLERGIES Allergies  Allergen Reactions   Trilyte [Peg 3350-Kcl-Na Bicarb-Nacl]  HEMATEMESIS   Vicodin [Hydrocodone-Acetaminophen] Nausea And Vomiting    PAST MEDICAL HISTORY Past Medical History:  Diagnosis Date   Anxiety and depression    Cataract    Combined forms OU   Hypercholesterolemia    Hypertension    Hypertensive retinopathy    OU   Hypothyroidism    Macular degeneration    OD   Osteoarthritis    Seasonal allergies    Past Surgical History:  Procedure Laterality Date   BIOPSY  10/01/2018   Procedure: BIOPSY;  Surgeon: Danie Binder, MD;  [facility];   Service: Endoscopy;;  gastric bx & gastric polyp   COLONOSCOPY N/A 10/01/2018   Procedure: COLONOSCOPY;  Surgeon: Danie Binder, MD;  [facility]: [facility];  Service: Endoscopy;  Laterality: N/A;  10:30am   ESOPHAGOGASTRODUODENOSCOPY N/A 10/01/2018   Procedure: ESOPHAGOGASTRODUODENOSCOPY (EGD);  Surgeon: Danie Binder, MD;  [facility]: [facility];  Service: Endoscopy;  Laterality: N/A;   POLYPECTOMY  10/01/2018   Procedure: POLYPECTOMY;  Surgeon: Danie Binder, MD;  [facility]: [facility];  Service: Endoscopy;;  colon    TUBAL LIGATION     UTERINE FIBROID SURGERY      FAMILY HISTORY Family History  Problem Relation Age of Onset   Cancer Mother        lung   Stroke Father    Stroke Brother    Colon cancer Neg Hx     SOCIAL HISTORY Social History   Tobacco Use   Smoking status: Former    Types: Cigarettes   Smokeless tobacco: Never  Vaping Use   Vaping Use: Never used  Substance Use Topics   Alcohol use: No   Drug use: No         OPHTHALMIC EXAM:  Base Eye Exam     Visual Acuity (Snellen - Linear)       Right Left   Dist cc 20/40 20/20 -2   Dist ph cc 20/30 -1     Correction: Glasses         Tonometry (Tonopen, 1:21 PM)       Right Left   Pressure 11 12         Pupils       Dark Light Shape React APD   Right 3 2 Round Brisk None   Left 3 2 Round Brisk None         Visual Fields (Counting fingers)       Left Right    Full Full         Extraocular Movement       Right Left    Full, Ortho Full, Ortho         Neuro/Psych     Oriented x3: Yes   Mood/Affect: Normal         Dilation     Right eye: 1.0% Mydriacyl, 2.5% Phenylephrine @ 1:21 PM  Deferred OS dilation per pt request MS          Slit Lamp and Fundus Exam     Slit Lamp Exam       Right Left   Lids/Lashes Dermatochalasis - upper lid Dermatochalasis - upper lid   Conjunctiva/Sclera White and quiet White and quiet   Cornea Arcus, early nasal band K,  trace PEE Arcus, early nasal band K, 1+PEE   Anterior Chamber moderate depth, with narrow angle  moderate depth, with narrow angle temporally   Iris Round and well dilated Round and well  dilated   Lens 2-3+ Nuclear sclerosis, 2-3+ Cortical cataract 2+ Nuclear sclerosis, 2+ Cortical cataract   Vitreous Vitreous syneresis, Posterior vitreous detachment, Weiss ring Vitreous syneresis         Fundus Exam       Right Left   Disc Pink and Sharp, Compact Pink and Sharp, Tilted disc   C/D Ratio 0.3 0.4   Macula Flat, blunted foveal reflex; central SRF - stably resolved; +drusen; +RPE mottling and clumping, small cluster of drusen and RPE mottling inferior fovea, no heme or edema Flat, good foveal reflex, Retinal pigment epithelial mottling, superior para-foveal focal area of RPE atrophy -- stable, rare drusen, No heme or edema   Vessels Vascular attenuation, Tortuous Vascular attenuation, Tortuous   Periphery Attached, peripheral drusen / RPE changes nasally, peripheral cystoid degeneration Attached, very mild lattice at 0430, mild patch of Lattice degeneration at 0600 -- stable, scattered peripheral drusen, peripheral cystoid degeneration           Refraction     Wearing Rx       Sphere Cylinder Axis Add   Right +2.00 +1.25 158 +2.50   Left +2.00 +0.75 002 +2.50            IMAGING AND PROCEDURES  Imaging and Procedures for 12/11/17  OCT, Retina - OU - Both Eyes       Right Eye Quality was good. Central Foveal Thickness: 252. Progression has been stable. Findings include no IRF, retinal drusen , normal foveal contour, no SRF (Stable improvement in SRF).   Left Eye Quality was good. Central Foveal Thickness: 276. Progression has been stable. Findings include normal foveal contour, no IRF, no SRF, retinal drusen .   Notes *Images captured and stored on drive  Diagnosis / Impression:  OD: Stable improvement in SRF OS: NFP, No IRF/SRF  Clinical management:  See  below  Abbreviations: NFP - Normal foveal profile. CME - cystoid macular edema. PED - pigment epithelial detachment. IRF - intraretinal fluid. SRF - subretinal fluid. EZ - ellipsoid zone. ERM - epiretinal membrane. ORA - outer retinal atrophy. ORT - outer retinal tubulation. SRHM - subretinal hyper-reflective material       Intravitreal Injection, Pharmacologic Agent - OD - Right Eye       Time Out 05/18/2021. 1:47 PM. Confirmed correct patient, procedure, site, and patient consented.   Anesthesia Topical anesthesia was used. Anesthetic medications included Lidocaine 2%, Proparacaine 0.5%.   Procedure Preparation included 5% betadine to ocular surface, eyelid speculum. A (32g) needle was used.   Injection: 2 mg aflibercept 2 MG/0.05ML   Route: Intravitreal, Site: Right Eye   NDC: A3590391, Lot: [id number], Expiration date: 02/25/2022, Waste: 0.05 mL   Post-op Post injection exam found visual acuity of at least counting fingers. The patient tolerated the procedure well. There were no complications. The patient received written and verbal post procedure care education. Post injection medications were not given.            ASSESSMENT/PLAN:    ICD-10-CM   1. Exudative age-related macular degeneration of right eye with active choroidal neovascularization (HCC)  H35.3211 Intravitreal Injection, Pharmacologic Agent - OD - Right Eye    aflibercept (EYLEA) SOLN 2 mg    2. 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 Retinal edema  H35.81 OCT, Retina - OU - Both Eyes    4. Essential hypertension  I10     5. Hypertensive retinopathy of both eyes  H35.033  6. Lattice degeneration of left retina  H35.412     7. Posterior vitreous detachment of right eye  H43.811     8. Combined forms of age-related cataract of both eyes  H25.813      1-3. Exudative age related macular degeneration vs CSCR OD  - Prior history of worsening fluid when delayed f/u to 6 wks     - FA (03.02.19) without leakage / pooling into area of SRF  - review of systems reveals significant stressors in life, but no exogenous steroid use  - discussed treatment with anti-VEGF vs po Eplerenone  - S/P IVA #1 OD (09.27.19),  #2 (10.25.19), #3 (11.22.19) -- minimal response  - pt approved for [employer] and IVE  - S/P IVE OD #1 (12.20.19), #2 (01.20.20), #3 (02.18.20), #4 (07.20.20) #5 (09.01.20), #6 (10.13.20), #7 (11.20.20), #8 (01.20.21), #9 (02.17.21), #10 (03.19.21), #11 (04.20.21), #12 (05.25.21), #13 (6.23.21), #14 (07.27.21), #15 (08.30.21), #16 (9.28.21), #17 (10.26.21), #18 (11.29.21), #19 (01.11.22), #20 (02.08.22), #21 (3.8.22), #22 (04.05.22), #23 (5.10.22), #24 (06.21.22), #25 (8.2.22)  - lost to f/u from Feb 2020 to July 2020 due to COVID-19 concerns  - today, VA stable at 20/30 OD -- ?new baseline  - OCT today shows stable resolution of SRF at 7 weeks  - recommend IVE OD #26 today, 09.20.22 -- maintenance w/ f/u in 8 wks  - pt wishes to proceed with IVE OD  - RBA of procedure discussed, questions answered  - informed consent obtained   - Eylea informed consent form signed and scanned on 12.22.2020  - see procedure note  - [email or web address] benefits investigation initiated -- approved for 2022  - f/u 8 weeks -- DFE/OCT/ possible injection -- slow treat and extend as able  4,5. Hypertensive retinopathy OU  - discussed importance of tight BP control  - stable  - monitor  6. Lattice degeneration OS-  - patch of inferior lattice OS -- no RT, holes or SRF  - asymptomatic  - discussed findings, prognosis, and treatment options including observation  - monitor  7. PVD / vitreous syneresis OD-   - Discussed findings and prognosis  - No RT or RD on 360 peripheral exam  - Reviewed s/s of RT/RD  - Strict return precautions for any such RT/RD signs/symptoms  8. Combined form age-related cataract OU-   - The symptoms of cataract, surgical options, and treatments and risks were  discussed with patient.  - discussed diagnosis and progression  - not yet visually significant  - monitor for now  Ophthalmic Meds Ordered this visit:  Meds ordered this encounter  Medications   aflibercept (EYLEA) SOLN 2 mg       Return in about 8 weeks (around 07/13/2021) for f/u exu ARMD OD, DFE, OCT.  There are no Patient Instructions on file for this visit.   This document serves as a record of services personally performed by Gardiner Sleeper, MD, PhD. It was created on their behalf by Estill Bakes, COT an ophthalmic technician. The creation of this record is the provider's dictation and/or activities during the visit.    Electronically signed by: Estill Bakes, COT 9.20.22 @ 4:28 PM   This document serves as a record of services personally performed by Gardiner Sleeper, MD, PhD. It was created on their behalf by San Jetty. Owens Shark, OA an ophthalmic technician. The creation of this record is the provider's dictation and/or activities during the visit.    Electronically signed by: San Jetty. [place], [place] 09.20.2022 4:28  PM   Gardiner Sleeper, M.D., Ph.D. Diseases & Surgery of the Retina and Vitreous [facility]  I have reviewed the above documentation for accuracy and completeness, and I agree with the above. Gardiner Sleeper, M.D., Ph.D. 05/19/21 4:28 PM  Abbreviations: M myopia (nearsighted); A astigmatism; H hyperopia (farsighted); P presbyopia; Mrx spectacle prescription;  CTL contact lenses; OD right eye; OS left eye; OU both eyes  XT exotropia; ET esotropia; PEK punctate epithelial keratitis; PEE punctate epithelial erosions; DES dry eye syndrome; MGD meibomian gland dysfunction; ATs artificial tears; PFAT's preservative free artificial tears; [place] nuclear sclerotic cataract; PSC posterior subcapsular cataract; ERM epi-retinal membrane; PVD posterior vitreous detachment; RD retinal detachment; DM diabetes mellitus; DR diabetic retinopathy; NPDR  non-proliferative diabetic retinopathy; PDR proliferative diabetic retinopathy; CSME clinically significant macular edema; DME diabetic macular edema; dbh dot blot hemorrhages; CWS cotton wool spot; POAG primary open angle glaucoma; C/D cup-to-disc ratio; HVF humphrey visual field; GVF goldmann visual field; OCT optical coherence tomography; IOP intraocular pressure; BRVO Branch retinal vein occlusion; CRVO central retinal vein occlusion; CRAO central retinal artery occlusion; BRAO branch retinal artery occlusion; RT retinal tear; SB scleral buckle; PPV pars plana vitrectomy; VH Vitreous hemorrhage; PRP panretinal laser photocoagulation; IVK intravitreal kenalog; VMT vitreomacular traction; MH Macular hole;  NVD neovascularization of the disc; NVE neovascularization elsewhere; AREDS age related eye disease study; ARMD age related macular degeneration; POAG primary open angle glaucoma; EBMD epithelial/anterior basement membrane dystrophy; ACIOL anterior chamber intraocular lens; IOL intraocular lens; PCIOL posterior chamber intraocular lens; Phaco/IOL phacoemulsification with intraocular lens placement; [place] photorefractive keratectomy; LASIK laser assisted in situ keratomileusis; HTN hypertension; DM diabetes mellitus; COPD chronic obstructive pulmonary disease

## 2021-05-19 ENCOUNTER — Encounter (INDEPENDENT_AMBULATORY_CARE_PROVIDER_SITE_OTHER): Payer: Self-pay | Admitting: Ophthalmology

## 2021-05-19 MED ORDER — AFLIBERCEPT 2MG/0.05ML IZ SOLN FOR KALEIDOSCOPE
2.0000 mg | INTRAVITREAL | Status: AC | PRN
  Administered 2021-05-18: 2 mg via INTRAVITREAL

## 2021-05-21 DIAGNOSIS — Z6834 Body mass index (BMI) 34.0-34.9, adult: Secondary | ICD-10-CM | POA: Diagnosis not present

## 2021-05-21 DIAGNOSIS — N390 Urinary tract infection, site not specified: Secondary | ICD-10-CM | POA: Diagnosis not present

## 2021-05-21 DIAGNOSIS — R350 Frequency of micturition: Secondary | ICD-10-CM | POA: Diagnosis not present

## 2021-05-21 DIAGNOSIS — Z299 Encounter for prophylactic measures, unspecified: Secondary | ICD-10-CM | POA: Diagnosis not present

## 2021-05-21 DIAGNOSIS — I10 Essential (primary) hypertension: Secondary | ICD-10-CM | POA: Diagnosis not present

## 2021-05-21 DIAGNOSIS — Z23 Encounter for immunization: Secondary | ICD-10-CM | POA: Diagnosis not present

## 2021-05-28 DIAGNOSIS — I10 Essential (primary) hypertension: Secondary | ICD-10-CM | POA: Diagnosis not present

## 2021-05-28 DIAGNOSIS — E039 Hypothyroidism, unspecified: Secondary | ICD-10-CM | POA: Diagnosis not present

## 2021-06-29 DIAGNOSIS — J069 Acute upper respiratory infection, unspecified: Secondary | ICD-10-CM | POA: Diagnosis not present

## 2021-06-29 DIAGNOSIS — Z299 Encounter for prophylactic measures, unspecified: Secondary | ICD-10-CM | POA: Diagnosis not present

## 2021-06-29 DIAGNOSIS — L853 Xerosis cutis: Secondary | ICD-10-CM | POA: Diagnosis not present

## 2021-06-29 DIAGNOSIS — M5431 Sciatica, right side: Secondary | ICD-10-CM | POA: Diagnosis not present

## 2021-06-29 DIAGNOSIS — E8940 Asymptomatic postprocedural ovarian failure: Secondary | ICD-10-CM | POA: Diagnosis not present

## 2021-07-13 ENCOUNTER — Other Ambulatory Visit: Payer: Self-pay

## 2021-07-13 ENCOUNTER — Ambulatory Visit (INDEPENDENT_AMBULATORY_CARE_PROVIDER_SITE_OTHER): Payer: Medicare HMO | Admitting: Ophthalmology

## 2021-07-13 ENCOUNTER — Encounter (INDEPENDENT_AMBULATORY_CARE_PROVIDER_SITE_OTHER): Payer: Self-pay | Admitting: Ophthalmology

## 2021-07-13 DIAGNOSIS — H35412 Lattice degeneration of retina, left eye: Secondary | ICD-10-CM

## 2021-07-13 DIAGNOSIS — H35033 Hypertensive retinopathy, bilateral: Secondary | ICD-10-CM | POA: Diagnosis not present

## 2021-07-13 DIAGNOSIS — H25813 Combined forms of age-related cataract, bilateral: Secondary | ICD-10-CM

## 2021-07-13 DIAGNOSIS — H353211 Exudative age-related macular degeneration, right eye, with active choroidal neovascularization: Secondary | ICD-10-CM | POA: Diagnosis not present

## 2021-07-13 DIAGNOSIS — H35711 Central serous chorioretinopathy, right eye: Secondary | ICD-10-CM

## 2021-07-13 DIAGNOSIS — H43811 Vitreous degeneration, right eye: Secondary | ICD-10-CM

## 2021-07-13 DIAGNOSIS — I10 Essential (primary) hypertension: Secondary | ICD-10-CM | POA: Diagnosis not present

## 2021-07-13 DIAGNOSIS — H3581 Retinal edema: Secondary | ICD-10-CM

## 2021-07-13 MED ORDER — AFLIBERCEPT 2MG/0.05ML IZ SOLN FOR KALEIDOSCOPE
2.0000 mg | INTRAVITREAL | Status: AC | PRN
Start: 2021-07-13 — End: 2021-07-13
  Administered 2021-07-13: 2 mg via INTRAVITREAL

## 2021-07-13 NOTE — Progress Notes (Signed)
[facility] Note  07/13/2021      CHIEF COMPLAINT Patient presents for Retina Follow Up   HISTORY OF PRESENT ILLNESS: Darlene Crawford is a 80 y.o. female who presents to the clinic today for:  HPI     Retina Follow Up   Patient presents with  Wet AMD.  In right eye.  Severity is moderate.  Duration of 8 weeks.  Since onset it is stable.  I, the attending physician,  performed the HPI with the patient and updated documentation appropriately.        Comments   8 week retina follow up. Patient states that she needs new glasses. Vision seems the same       Last edited by Bernarda Caffey, MD on 07/13/2021  3:21 PM.    Pt states vision is the same  Referring physician: Leticia Clas, [place] [place],  [place] [address]  HISTORICAL INFORMATION:  Selected notes from the MEDICAL RECORD NUMBER Referred by Dr. Leander Rams for concern of ARMD OD with progression to CNVM vs CSR   CURRENT MEDICATIONS: Current Outpatient Medications (Ophthalmic Drugs)  Medication Sig   ARTIFICIAL TEAR SOLUTION OP Place 1 drop into both eyes daily as needed (dry eyes).   Current Facility-Administered Medications (Ophthalmic Drugs)  Medication Route   aflibercept (EYLEA) SOLN 2 mg Intravitreal   aflibercept (EYLEA) SOLN 2 mg Intravitreal   aflibercept (EYLEA) SOLN 2 mg Intravitreal   Current Outpatient Medications (Other)  Medication Sig   atorvastatin (LIPITOR) 10 MG tablet Take 10 mg by mouth at bedtime.    Cholecalciferol (VITAMIN D) 50 MCG (2000 UT) tablet Take 4,000 Units by mouth at bedtime.    levothyroxine (SYNTHROID, LEVOTHROID) 100 MCG tablet Take 100 mcg by mouth daily before breakfast.   losartan (COZAAR) 100 MG tablet Take 100 mg by mouth daily.    Melatonin 10 MG CAPS Take 10 mg by mouth at bedtime.   Multiple Vitamins-Minerals (PRESERVISION AREDS 2 PO) Take 1 tablet by mouth at bedtime.    sertraline (ZOLOFT) 50 MG tablet Take 50 mg by mouth  daily.   sodium chloride (OCEAN) 0.65 % SOLN nasal spray Place 1 spray into both nostrils as needed for congestion.   acidophilus (RISAQUAD) CAPS capsule Take 1 capsule by mouth daily. (Patient not taking: No sig reported)   Cyanocobalamin (B-12 PO) Take 1 Dose by mouth 2 (two) times a week. (Patient not taking: Reported on 07/13/2021)   fluticasone (FLONASE) 50 MCG/ACT nasal spray Place 1 spray into both nostrils daily as needed for allergies or rhinitis. (Patient not taking: No sig reported)   hydrochlorothiazide (HYDRODIURIL) 25 MG tablet Take 25 mg by mouth daily. 1/2 tablet am, 1/2 tablet pm (Patient not taking: No sig reported)   loratadine (CLARITIN) 10 MG tablet Take 10 mg by mouth daily as needed for allergies.  (Patient not taking: No sig reported)   Current Facility-Administered Medications (Other)  Medication Route   Bevacizumab (AVASTIN) SOLN 1.25 mg Intravitreal   Bevacizumab (AVASTIN) SOLN 1.25 mg Intravitreal   Bevacizumab (AVASTIN) SOLN 1.25 mg Intravitreal   REVIEW OF SYSTEMS: ROS   Positive for: Genitourinary, Musculoskeletal, Endocrine, Eyes, Psychiatric, Allergic/Imm Negative for: Constitutional, Gastrointestinal, Neurological, Skin, HENT, Cardiovascular, Respiratory, Heme/Lymph Last edited by Elmore Guise, COT on 07/13/2021  1:03 PM.     ALLERGIES Allergies  Allergen Reactions   Trilyte [Peg 3350-Kcl-Na Bicarb-Nacl]     HEMATEMESIS   Vicodin [  Hydrocodone-Acetaminophen] Nausea And Vomiting    PAST MEDICAL HISTORY Past Medical History:  Diagnosis Date   Anxiety and depression    Cataract    Combined forms OU   Hypercholesterolemia    Hypertension    Hypertensive retinopathy    OU   Hypothyroidism    Macular degeneration    OD   Osteoarthritis    Seasonal allergies    Past Surgical History:  Procedure Laterality Date   BIOPSY  10/01/2018   Procedure: BIOPSY;  Surgeon: Danie Binder, MD;  [facility];  Service: Endoscopy;;  gastric bx &  gastric polyp   COLONOSCOPY N/A 10/01/2018   Procedure: COLONOSCOPY;  Surgeon: Danie Binder, MD;  [facility];  Service: Endoscopy;  Laterality: N/A;  10:30am   ESOPHAGOGASTRODUODENOSCOPY N/A 10/01/2018   Procedure: ESOPHAGOGASTRODUODENOSCOPY (EGD);  Surgeon: Danie Binder, MD;  [facility]: [facility];  Service: Endoscopy;  Laterality: N/A;   POLYPECTOMY  10/01/2018   Procedure: POLYPECTOMY;  Surgeon: Danie Binder, MD;  [facility]: [facility];  Service: Endoscopy;;  colon    TUBAL LIGATION     UTERINE FIBROID SURGERY     FAMILY HISTORY Family History  Problem Relation Age of Onset   Cancer Mother        lung   Stroke Father    Stroke Brother    Colon cancer Neg Hx    SOCIAL HISTORY Social History   Tobacco Use   Smoking status: Former    Types: Cigarettes   Smokeless tobacco: Never  Vaping Use   Vaping Use: Never used  Substance Use Topics   Alcohol use: No   Drug use: No       OPHTHALMIC EXAM:  Base Eye Exam     Visual Acuity (Snellen - Linear)       Right Left   Dist cc 20/40+1 20/20-1   Dist ph cc 20/30-3     Correction: Glasses         Tonometry (Tonopen, 1:06 PM)       Right Left   Pressure 14 13         Pupils       Dark Light Shape React APD   Right 3 2 Round Brisk None   Left 3 2 Round Brisk None         Visual Fields (Counting fingers)       Left Right    Full Full         Extraocular Movement       Right Left    Full, Ortho Full, Ortho         Neuro/Psych     Oriented x3: Yes   Mood/Affect: Normal         Dilation     Right eye: 1.0% Mydriacyl, 2.5% Phenylephrine @ 1:06 PM           Slit Lamp and Fundus Exam     Slit Lamp Exam       Right Left   Lids/Lashes Dermatochalasis - upper lid Dermatochalasis - upper lid   Conjunctiva/Sclera White and quiet White and quiet   Cornea Arcus, early nasal band K, trace PEE Arcus, early nasal band K, 1+PEE   Anterior Chamber moderate depth, with  narrow angle  moderate depth, with narrow angle temporally   Iris Round and well dilated Round and well dilated   Lens 2-3+ Nuclear sclerosis, 2-3+ Cortical cataract 2+ Nuclear sclerosis, 2+ Cortical cataract  Vitreous Vitreous syneresis, Posterior vitreous detachment, Weiss ring Vitreous syneresis         Fundus Exam       Right Left   Disc Pink and Sharp, Compact Pink and Sharp, Tilted disc   C/D Ratio 0.3 0.4   Macula Flat, blunted foveal reflex; central SRF - stably resolved; +drusen; +RPE mottling and clumping, small cluster of drusen and RPE mottling inferior fovea, no heme or edema Flat, good foveal reflex, Retinal pigment epithelial mottling, superior para-foveal focal area of RPE atrophy -- stable, rare drusen, No heme or edema   Vessels attenuated, Tortuous Vascular attenuation, Tortuous   Periphery Attached, peripheral drusen / RPE changes nasally, peripheral cystoid degeneration Attached, very mild lattice at 0430, mild patch of Lattice degeneration at 0600 -- stable, scattered peripheral drusen, peripheral cystoid degeneration           Refraction     Wearing Rx       Sphere Cylinder Axis Add   Right +2.00 +1.25 158 +2.50   Left +2.00 +0.75 002 +2.50            IMAGING AND PROCEDURES  Imaging and Procedures for 12/11/17  OCT, Retina - OU - Both Eyes       Right Eye Quality was good. Central Foveal Thickness: 254. Progression has been stable. Findings include no IRF, retinal drusen , normal foveal contour, no SRF (Stable improvement in SRF).   Left Eye Quality was good. Central Foveal Thickness: 276. Progression has been stable. Findings include normal foveal contour, no IRF, no SRF, retinal drusen .   Notes *Images captured and stored on drive  Diagnosis / Impression:  OD: NFP; No IRF; Stable improvement in SRF OS: NFP, No IRF/SRF  Clinical management:  See below  Abbreviations: NFP - Normal foveal profile. CME - cystoid macular edema. PED -  pigment epithelial detachment. IRF - intraretinal fluid. SRF - subretinal fluid. EZ - ellipsoid zone. ERM - epiretinal membrane. ORA - outer retinal atrophy. ORT - outer retinal tubulation. SRHM - subretinal hyper-reflective material       Intravitreal Injection, Pharmacologic Agent - OD - Right Eye       Time Out 07/13/2021. 1:17 PM. Confirmed correct patient, procedure, site, and patient consented.   Anesthesia Topical anesthesia was used. Anesthetic medications included Lidocaine 2%, Proparacaine 0.5%.   Procedure Preparation included 5% betadine to ocular surface, eyelid speculum. A (32g) needle was used.   Injection: 2 mg aflibercept 2 MG/0.05ML   Route: Intravitreal, Site: Right Eye   NDC: A3590391, Lot: [id number], Expiration date: 04/29/2022, Waste: 0.05 mL   Post-op Post injection exam found visual acuity of at least counting fingers. The patient tolerated the procedure well. There were no complications. The patient received written and verbal post procedure care education. Post injection medications were not given.             ASSESSMENT/PLAN:    ICD-10-CM   1. Exudative age-related macular degeneration of right eye with active choroidal neovascularization (HCC)  H35.3211 Intravitreal Injection, Pharmacologic Agent - OD - Right Eye    aflibercept (EYLEA) SOLN 2 mg    2. 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 Retinal edema  H35.81 OCT, Retina - OU - Both Eyes    4. Essential hypertension  I10     5. Hypertensive retinopathy of both eyes  H35.033     6. Lattice degeneration of left retina  H35.412  7. Posterior vitreous detachment of right eye  H43.811     8. Combined forms of age-related cataract of both eyes  H25.813       1-3. Exudative age related macular degeneration vs CSCR OD  - Prior history of worsening fluid when delayed f/u to 6 wks    - FA (03.02.19) without leakage / pooling into area of SRF  - review of systems  reveals significant stressors in life, but no exogenous steroid use  - discussed treatment with anti-VEGF vs po Eplerenone  - S/P IVA #1 OD (09.27.19),  #2 (10.25.19), #3 (11.22.19) -- minimal response  - pt approved for [employer] and IVE  - S/P IVE OD #1 (12.20.19), #2 (01.20.20), #3 (02.18.20), #4 (07.20.20) #5 (09.01.20), #6 (10.13.20), #7 (11.20.20), #8 (01.20.21), #9 (02.17.21), #10 (03.19.21), #11 (04.20.21), #12 (05.25.21), #13 (6.23.21), #14 (07.27.21), #15 (08.30.21), #16 (9.28.21), #17 (10.26.21), #18 (11.29.21), #19 (01.11.22), #20 (02.08.22), #21 (3.8.22), #22 (04.05.22), #23 (5.10.22), #24 (06.21.22), #25 (8.2.22), #26 (9.20.22)  - lost to f/u from Feb 2020 to July 2020 due to COVID-19 concerns  - today, VA stable at 20/30 OD -- new baseline  - OCT today shows stable resolution of SRF at 8 weeks  - recommend IVE OD #27 today, 11.15.22 -- maintenance w/ f/u in 9 wks  - pt wishes to proceed with IVE OD  - RBA of procedure discussed, questions answered  - informed consent obtained   - Eylea informed consent form signed and scanned on 12.22.2020  - see procedure note  - [email or web address] benefits investigation initiated -- approved for 2022  - f/u 9 weeks -- DFE/OCT/ possible injection -- slow treat and extend as able  4,5. Hypertensive retinopathy OU  - discussed importance of tight BP control  - stable  - monitor  6. Lattice degeneration OS-  - patch of inferior lattice OS -- no RT, holes or SRF  - asymptomatic  - discussed findings, prognosis, and treatment options including observation  - monitor  7. PVD / vitreous syneresis OD-   - Discussed findings and prognosis  - No RT or RD on 360 peripheral exam  - Reviewed s/s of RT/RD  - Strict return precautions for any such RT/RD signs/symptoms  8. Combined form age-related cataract OU-   - The symptoms of cataract, surgical options, and treatments and risks were discussed with patient.  - discussed diagnosis and progression  - not  yet visually significant  - monitor for now  Ophthalmic Meds Ordered this visit:  Meds ordered this encounter  Medications   aflibercept (EYLEA) SOLN 2 mg     Return in about 9 weeks (around 09/14/2021) for f/u exu ARMD OD, DFE, OCT.  There are no Patient Instructions on file for this visit.   This document serves as a record of services personally performed by Gardiner Sleeper, MD, PhD. It was created on their behalf by Estill Bakes, COT an ophthalmic technician. The creation of this record is the provider's dictation and/or activities during the visit.    Electronically signed by: Estill Bakes, [place] 11.15.22 @ 4:28 PM   This document serves as a record of services personally performed by Gardiner Sleeper, MD, PhD. It was created on their behalf by San Jetty. Owens Shark, OA an ophthalmic technician. The creation of this record is the provider's dictation and/or activities during the visit.    Electronically signed by: San Jetty. Owens Shark, [place] 11.15.2022 4:28 PM  Gardiner Sleeper, M.D., Ph.D. Diseases & Surgery of  the Retina and Vitreous [facility]  I have reviewed the above documentation for accuracy and completeness, and I agree with the above. Gardiner Sleeper, M.D., Ph.D. 07/13/21 4:28 PM  Abbreviations: M myopia (nearsighted); A astigmatism; H hyperopia (farsighted); P presbyopia; Mrx spectacle prescription;  CTL contact lenses; OD right eye; OS left eye; OU both eyes  XT exotropia; ET esotropia; PEK punctate epithelial keratitis; PEE punctate epithelial erosions; DES dry eye syndrome; MGD meibomian gland dysfunction; ATs artificial tears; PFAT's preservative free artificial tears; [place] nuclear sclerotic cataract; PSC posterior subcapsular cataract; ERM epi-retinal membrane; PVD posterior vitreous detachment; RD retinal detachment; DM diabetes mellitus; DR diabetic retinopathy; NPDR non-proliferative diabetic retinopathy; PDR proliferative diabetic retinopathy; CSME  clinically significant macular edema; DME diabetic macular edema; dbh dot blot hemorrhages; CWS cotton wool spot; POAG primary open angle glaucoma; C/D cup-to-disc ratio; HVF humphrey visual field; GVF goldmann visual field; OCT optical coherence tomography; IOP intraocular pressure; BRVO Branch retinal vein occlusion; CRVO central retinal vein occlusion; CRAO central retinal artery occlusion; BRAO branch retinal artery occlusion; RT retinal tear; SB scleral buckle; PPV pars plana vitrectomy; VH Vitreous hemorrhage; PRP panretinal laser photocoagulation; IVK intravitreal kenalog; VMT vitreomacular traction; MH Macular hole;  NVD neovascularization of the disc; NVE neovascularization elsewhere; AREDS age related eye disease study; ARMD age related macular degeneration; POAG primary open angle glaucoma; EBMD epithelial/anterior basement membrane dystrophy; ACIOL anterior chamber intraocular lens; IOL intraocular lens; PCIOL posterior chamber intraocular lens; Phaco/IOL phacoemulsification with intraocular lens placement; [place] photorefractive keratectomy; LASIK laser assisted in situ keratomileusis; HTN hypertension; DM diabetes mellitus; COPD chronic obstructive pulmonary disease

## 2021-07-28 DIAGNOSIS — Z299 Encounter for prophylactic measures, unspecified: Secondary | ICD-10-CM | POA: Diagnosis not present

## 2021-07-28 DIAGNOSIS — Z6833 Body mass index (BMI) 33.0-33.9, adult: Secondary | ICD-10-CM | POA: Diagnosis not present

## 2021-07-28 DIAGNOSIS — I10 Essential (primary) hypertension: Secondary | ICD-10-CM | POA: Diagnosis not present

## 2021-07-28 DIAGNOSIS — B029 Zoster without complications: Secondary | ICD-10-CM | POA: Diagnosis not present

## 2021-07-28 DIAGNOSIS — E039 Hypothyroidism, unspecified: Secondary | ICD-10-CM | POA: Diagnosis not present

## 2021-08-17 DIAGNOSIS — Z23 Encounter for immunization: Secondary | ICD-10-CM | POA: Diagnosis not present

## 2021-08-27 DIAGNOSIS — I10 Essential (primary) hypertension: Secondary | ICD-10-CM | POA: Diagnosis not present

## 2021-09-13 NOTE — Progress Notes (Signed)
[facility] Note  09/14/2021      CHIEF COMPLAINT Patient presents for Retina Follow Up    HISTORY OF PRESENT ILLNESS: Darlene Crawford is a 81 y.o. 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  Severity is moderate.  Duration of 9 weeks.  Since onset it is stable.  I, the attending physician,  performed the HPI with the patient and updated documentation appropriately.        Comments   Pt here for 9 wk ret f/u for exu ARMD OD. Pt states vision is the same, no changes.       Last edited by Bernarda Caffey, MD on 09/14/2021  1:45 PM.     Referring physician: Leticia Clas, [address] [place],  [place] [address]  HISTORICAL INFORMATION:  Selected notes from the MEDICAL RECORD NUMBER Referred by Dr. Leander Rams for concern of ARMD OD with progression to CNVM vs CSR   CURRENT MEDICATIONS: Current Outpatient Medications (Ophthalmic Drugs)  Medication Sig   ARTIFICIAL TEAR SOLUTION OP Place 1 drop into both eyes daily as needed (dry eyes).   Current Facility-Administered Medications (Ophthalmic Drugs)  Medication Route   aflibercept (EYLEA) SOLN 2 mg Intravitreal   aflibercept (EYLEA) SOLN 2 mg Intravitreal   aflibercept (EYLEA) SOLN 2 mg Intravitreal   Current Outpatient Medications (Other)  Medication Sig   levothyroxine (SYNTHROID, LEVOTHROID) 100 MCG tablet Take 100 mcg by mouth daily before breakfast.   losartan (COZAAR) 100 MG tablet Take 100 mg by mouth daily.    Melatonin 10 MG CAPS Take 10 mg by mouth at bedtime.   Multiple Vitamins-Minerals (PRESERVISION AREDS 2 PO) Take 1 tablet by mouth at bedtime.    sertraline (ZOLOFT) 50 MG tablet Take 50 mg by mouth daily.   sodium chloride (OCEAN) 0.65 % SOLN nasal spray Place 1 spray into both nostrils as needed for congestion.   acidophilus (RISAQUAD) CAPS capsule Take 1 capsule by mouth daily. (Patient not  taking: Reported on 02/16/2021)   atorvastatin (LIPITOR) 10 MG tablet Take 10 mg by mouth at bedtime.    Cholecalciferol (VITAMIN D) 50 MCG (2000 UT) tablet Take 4,000 Units by mouth at bedtime.    Cyanocobalamin (B-12 PO) Take 1 Dose by mouth 2 (two) times a week. (Patient not taking: Reported on 07/13/2021)   fluticasone (FLONASE) 50 MCG/ACT nasal spray Place 1 spray into both nostrils daily as needed for allergies or rhinitis. (Patient not taking: Reported on 02/16/2021)   hydrochlorothiazide (HYDRODIURIL) 25 MG tablet Take 25 mg by mouth daily. 1/2 tablet am, 1/2 tablet pm (Patient not taking: Reported on 12/01/2020)   loratadine (CLARITIN) 10 MG tablet Take 10 mg by mouth daily as needed for allergies.  (Patient not taking: Reported on 02/16/2021)   Current Facility-Administered Medications (Other)  Medication Route   Bevacizumab (AVASTIN) SOLN 1.25 mg Intravitreal   Bevacizumab (AVASTIN) SOLN 1.25 mg Intravitreal   Bevacizumab (AVASTIN) SOLN 1.25 mg Intravitreal   REVIEW OF SYSTEMS: ROS   Positive for: Genitourinary, Musculoskeletal, Endocrine, Eyes, Psychiatric, Allergic/Imm Negative for: Constitutional, Gastrointestinal, Neurological, Skin, HENT, Cardiovascular, Respiratory, Heme/Lymph Last edited by Kingsley Spittle, COT on 09/14/2021  1:04 PM.      ALLERGIES Allergies  Allergen Reactions   Trilyte [Peg 3350-Kcl-Na Bicarb-Nacl]     HEMATEMESIS   Vicodin [  Hydrocodone-Acetaminophen] Nausea And Vomiting    PAST MEDICAL HISTORY Past Medical History:  Diagnosis Date   Anxiety and depression    Cataract    Combined forms OU   Hypercholesterolemia    Hypertension    Hypertensive retinopathy    OU   Hypothyroidism    Macular degeneration    OD   Osteoarthritis    Seasonal allergies    Past Surgical History:  Procedure Laterality Date   BIOPSY  10/01/2018   Procedure: BIOPSY;  Surgeon: Danie Binder, MD;  [facility];  Service: Endoscopy;;  gastric bx &  gastric polyp   COLONOSCOPY N/A 10/01/2018   Procedure: COLONOSCOPY;  Surgeon: Danie Binder, MD;  [facility];  Service: Endoscopy;  Laterality: N/A;  10:30am   ESOPHAGOGASTRODUODENOSCOPY N/A 10/01/2018   Procedure: ESOPHAGOGASTRODUODENOSCOPY (EGD);  Surgeon: Danie Binder, MD;  [facility];  Service: Endoscopy;  Laterality: N/A;   POLYPECTOMY  10/01/2018   Procedure: POLYPECTOMY;  Surgeon: Danie Binder, MD;  [facility];  Service: Endoscopy;;  colon    TUBAL LIGATION     UTERINE FIBROID SURGERY     FAMILY HISTORY Family History  Problem Relation Age of Onset   Cancer Mother        lung   Stroke Father    Stroke Brother    Colon cancer Neg Hx    SOCIAL HISTORY Social History   Tobacco Use   Smoking status: Former    Types: Cigarettes   Smokeless tobacco: Never  Vaping Use   Vaping Use: Never used  Substance Use Topics   Alcohol use: No   Drug use: No       OPHTHALMIC EXAM:  Base Eye Exam     Visual Acuity (Snellen - Linear)       Right Left   Dist cc 20/30 -1 20/20 -2   Dist ph cc NI NI    Correction: Glasses         Tonometry (Tonopen, 1:10 PM)       Right Left   Pressure 12 14         Pupils       Dark Light Shape React APD   Right 3 2 Round Brisk None   Left 3 2 Round Brisk None         Visual Fields (Counting fingers)       Left Right    Full Full         Extraocular Movement       Right Left    Full, Ortho Full, Ortho         Neuro/Psych     Oriented x3: Yes   Mood/Affect: Normal         Dilation     Right eye: 1.0% Mydriacyl, 2.5% Phenylephrine @ 1:10 PM           Slit Lamp and Fundus Exam     Slit Lamp Exam       Right Left   Lids/Lashes Dermatochalasis - upper lid Dermatochalasis - upper lid   Conjunctiva/Sclera White and quiet White and quiet   Cornea Arcus, early nasal band K, trace PEE Arcus, early nasal band K, 1+PEE   Anterior Chamber moderate depth, with  narrow angle  moderate depth, with narrow angle temporally   Iris Round and well dilated Round and well dilated   Lens 2-3+ Nuclear sclerosis, 2-3+ Cortical cataract 2+ Nuclear sclerosis, 2+ Cortical  cataract   Anterior Vitreous Vitreous syneresis, Posterior vitreous detachment, Weiss ring Vitreous syneresis         Fundus Exam       Right Left   Disc Pink and Sharp, Compact Pink and Sharp, Tilted disc   C/D Ratio 0.3 0.4   Macula Flat, blunted foveal reflex; central SRF - stably resolved; +drusen; +RPE mottling and clumping, small cluster of drusen and RPE mottling inferior fovea, no heme or edema Flat, good foveal reflex, Retinal pigment epithelial mottling, superior para-foveal focal area of RPE atrophy -- stable, rare drusen, No heme or edema   Vessels attenuated, Tortuous Vascular attenuation, Tortuous   Periphery Attached, peripheral drusen / RPE changes nasally, peripheral cystoid degeneration Attached, very mild lattice at 0430, mild patch of Lattice degeneration at 0600 -- stable, scattered peripheral drusen, peripheral cystoid degeneration           Refraction     Wearing Rx       Sphere Cylinder Axis Add   Right +2.00 +1.25 158 +2.50   Left +2.00 +0.75 002 +2.50            IMAGING AND PROCEDURES  Imaging and Procedures for 12/11/17  OCT, Retina - OU - Both Eyes       Right Eye Quality was good. Central Foveal Thickness: 254. Progression has been stable. Findings include no IRF, retinal drusen , normal foveal contour, no SRF (Stable improvement in SRF).   Left Eye Quality was good. Central Foveal Thickness: 277. Progression has been stable. Findings include normal foveal contour, no IRF, no SRF, retinal drusen .   Notes *Images captured and stored on drive  Diagnosis / Impression:  OD: NFP; No IRF; Stable improvement in SRF OS: NFP, No IRF/SRF  Clinical management:  See below  Abbreviations: NFP - Normal foveal profile. CME - cystoid macular edema.  PED - pigment epithelial detachment. IRF - intraretinal fluid. SRF - subretinal fluid. EZ - ellipsoid zone. ERM - epiretinal membrane. ORA - outer retinal atrophy. ORT - outer retinal tubulation. SRHM - subretinal hyper-reflective material       Intravitreal Injection, Pharmacologic Agent - OD - Right Eye       Time Out 09/14/2021. 1:14 PM. Confirmed correct patient, procedure, site, and patient consented.   Anesthesia Topical anesthesia was used. Anesthetic medications included Lidocaine 2%, Proparacaine 0.5%.   Procedure Preparation included 5% betadine to ocular surface, eyelid speculum. A (32g) needle was used.   Injection: 2 mg aflibercept 2 MG/0.05ML   Route: Intravitreal, Site: Right Eye   NDC: [id number], Lot: [id number], Expiration date: 07/28/2022, Waste: 0.05 mL   Post-op Post injection exam found visual acuity of at least counting fingers. The patient tolerated the procedure well. There were no complications. The patient received written and verbal post procedure care education. Post injection medications were not given.            ASSESSMENT/PLAN:    ICD-10-CM   1. Exudative age-related macular degeneration of right eye with active choroidal neovascularization (HCC)  H35.3211 OCT, Retina - OU - Both Eyes    Intravitreal Injection, Pharmacologic Agent - OD - Right Eye    aflibercept (EYLEA) SOLN 2 mg    2. 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 Essential hypertension  I10     4. Hypertensive retinopathy of both eyes  H35.033     5. 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 Posterior  vitreous detachment of right eye  H43.811     7. Combined forms of age-related cataract of both eyes  H25.813      1,2. Exudative age related macular degeneration vs CSCR OD  - Prior history of worsening fluid when delayed f/u to 6 wks    - FA (03.02.19) without leakage / pooling into area of SRF  - review of systems reveals significant  stressors in life, but no exogenous steroid use  - discussed treatment with anti-VEGF vs po Eplerenone  - S/P IVA #1 OD (09.27.19),  #2 (10.25.19), #3 (11.22.19) -- minimal response  - pt approved for [employer] and IVE  - S/P IVE OD #1 (12.20.19), #2 (01.20.20), #3 (02.18.20), #4 (07.20.20) #5 (09.01.20), #6 (10.13.20), #7 (11.20.20), #8 (01.20.21), #9 (02.17.21), #10 (03.19.21), #11 (04.20.21), #12 (05.25.21), #13 (6.23.21), #14 (07.27.21), #15 (08.30.21), #16 (9.28.21), #17 (10.26.21), #18 (11.29.21), #19 (01.11.22), #20 (02.08.22), #21 (3.8.22), #22 (04.05.22), #23 (5.10.22), #24 (06.21.22), #25 (8.2.22), #26 (9.20.22), #27 (11.15.22)  - lost to f/u from Feb 2020 to July 2020 due to COVID-19 concerns  - today, VA stable at 20/30 OD -- baseline  - OCT today shows stable resolution of SRF at 9 weeks  - recommend IVE OD #28 today, 1.17.23 -- maintenance w/ ext f/u to 10 wks  - pt wishes to proceed with IVE OD  - RBA of procedure discussed, questions answered  - informed consent obtained   - Eylea informed consent form signed and scanned on 01.17.23  - see procedure note  - [employer] benefits investigation initiated -- approved for 2022  - f/u 10 weeks -- DFE/OCT/ possible injection -- slow treat and extend as able  3,4. Hypertensive retinopathy OU  - discussed importance of tight BP control  - stable  - monitor  5. Lattice degeneration OS-  - patch of inferior lattice OS -- no RT, holes or SRF  - asymptomatic  - discussed findings, prognosis, and treatment options including observation  - monitor  6. PVD / vitreous syneresis OD-   - Discussed findings and prognosis  - No RT or RD on 360 peripheral exam  - Reviewed s/s of RT/RD  - Strict return precautions for any such RT/RD signs/symptoms  7. Combined form age-related cataract OU-   - The symptoms of cataract, surgical options, and treatments and risks were discussed with patient.  - discussed diagnosis and progression  - not yet  visually significant  - monitor for now  Ophthalmic Meds Ordered this visit:  Meds ordered this encounter  Medications   aflibercept (EYLEA) SOLN 2 mg     Return in about 10 weeks (around 11/23/2021) for f/u exu ARMD OD, DFE, OCT.  There are no Patient Instructions on file for this visit.   This document serves as a record of services personally performed by Gardiner Sleeper, MD, PhD. It was created on their behalf by Estill Bakes, COT an ophthalmic technician. The creation of this record is the provider's dictation and/or activities during the visit.    Electronically signed by: Estill Bakes, COT 1.16.23 @ 1:47 PM   This document serves as a record of services personally performed by Gardiner Sleeper, MD, PhD. It was created on their behalf by San Jetty. Owens Shark, OA an ophthalmic technician. The creation of this record is the provider's dictation and/or activities during the visit.    Electronically signed by: San Jetty. Owens Shark, [place] 01.17.2023 1:47 PM   Gardiner Sleeper, M.D., Ph.D. Diseases & Surgery of  the Retina and Vitreous [facility]  I have reviewed the above documentation for accuracy and completeness, and I agree with the above. Gardiner Sleeper, M.D., Ph.D. 09/14/21 1:47 PM   Abbreviations: M myopia (nearsighted); A astigmatism; H hyperopia (farsighted); P presbyopia; Mrx spectacle prescription;  CTL contact lenses; OD right eye; OS left eye; OU both eyes  XT exotropia; ET esotropia; PEK punctate epithelial keratitis; PEE punctate epithelial erosions; DES dry eye syndrome; MGD meibomian gland dysfunction; ATs artificial tears; PFAT's preservative free artificial tears; [place] nuclear sclerotic cataract; PSC posterior subcapsular cataract; ERM epi-retinal membrane; PVD posterior vitreous detachment; RD retinal detachment; DM diabetes mellitus; DR diabetic retinopathy; NPDR non-proliferative diabetic retinopathy; PDR proliferative diabetic retinopathy; CSME  clinically significant macular edema; DME diabetic macular edema; dbh dot blot hemorrhages; CWS cotton wool spot; POAG primary open angle glaucoma; C/D cup-to-disc ratio; HVF humphrey visual field; GVF goldmann visual field; OCT optical coherence tomography; IOP intraocular pressure; BRVO Branch retinal vein occlusion; CRVO central retinal vein occlusion; CRAO central retinal artery occlusion; BRAO branch retinal artery occlusion; RT retinal tear; SB scleral buckle; PPV pars plana vitrectomy; VH Vitreous hemorrhage; PRP panretinal laser photocoagulation; IVK intravitreal kenalog; VMT vitreomacular traction; MH Macular hole;  NVD neovascularization of the disc; NVE neovascularization elsewhere; AREDS age related eye disease study; ARMD age related macular degeneration; POAG primary open angle glaucoma; EBMD epithelial/anterior basement membrane dystrophy; ACIOL anterior chamber intraocular lens; IOL intraocular lens; PCIOL posterior chamber intraocular lens; Phaco/IOL phacoemulsification with intraocular lens placement; [place] photorefractive keratectomy; LASIK laser assisted in situ keratomileusis; HTN hypertension; DM diabetes mellitus; COPD chronic obstructive pulmonary disease

## 2021-09-14 ENCOUNTER — Ambulatory Visit (INDEPENDENT_AMBULATORY_CARE_PROVIDER_SITE_OTHER): Payer: Medicare HMO | Admitting: Ophthalmology

## 2021-09-14 ENCOUNTER — Other Ambulatory Visit: Payer: Self-pay

## 2021-09-14 ENCOUNTER — Encounter (INDEPENDENT_AMBULATORY_CARE_PROVIDER_SITE_OTHER): Payer: Self-pay | Admitting: Ophthalmology

## 2021-09-14 DIAGNOSIS — H25813 Combined forms of age-related cataract, bilateral: Secondary | ICD-10-CM

## 2021-09-14 DIAGNOSIS — H35711 Central serous chorioretinopathy, right eye: Secondary | ICD-10-CM

## 2021-09-14 DIAGNOSIS — H35033 Hypertensive retinopathy, bilateral: Secondary | ICD-10-CM | POA: Diagnosis not present

## 2021-09-14 DIAGNOSIS — H35412 Lattice degeneration of retina, left eye: Secondary | ICD-10-CM | POA: Diagnosis not present

## 2021-09-14 DIAGNOSIS — H43811 Vitreous degeneration, right eye: Secondary | ICD-10-CM | POA: Diagnosis not present

## 2021-09-14 DIAGNOSIS — H353211 Exudative age-related macular degeneration, right eye, with active choroidal neovascularization: Secondary | ICD-10-CM

## 2021-09-14 DIAGNOSIS — I10 Essential (primary) hypertension: Secondary | ICD-10-CM

## 2021-09-14 MED ORDER — AFLIBERCEPT 2MG/0.05ML IZ SOLN FOR KALEIDOSCOPE
2.0000 mg | INTRAVITREAL | Status: AC | PRN
  Administered 2021-09-14: 2 mg via INTRAVITREAL

## 2021-09-16 DIAGNOSIS — I10 Essential (primary) hypertension: Secondary | ICD-10-CM | POA: Diagnosis not present

## 2021-09-16 DIAGNOSIS — Z299 Encounter for prophylactic measures, unspecified: Secondary | ICD-10-CM | POA: Diagnosis not present

## 2021-09-16 DIAGNOSIS — N390 Urinary tract infection, site not specified: Secondary | ICD-10-CM | POA: Diagnosis not present

## 2021-09-16 DIAGNOSIS — N183 Chronic kidney disease, stage 3 unspecified: Secondary | ICD-10-CM | POA: Diagnosis not present

## 2021-09-16 DIAGNOSIS — Z789 Other specified health status: Secondary | ICD-10-CM | POA: Diagnosis not present

## 2021-09-16 DIAGNOSIS — N1831 Chronic kidney disease, stage 3a: Secondary | ICD-10-CM | POA: Diagnosis not present

## 2021-10-27 DIAGNOSIS — N1831 Chronic kidney disease, stage 3a: Secondary | ICD-10-CM | POA: Diagnosis not present

## 2021-10-27 DIAGNOSIS — I10 Essential (primary) hypertension: Secondary | ICD-10-CM | POA: Diagnosis not present

## 2021-10-27 DIAGNOSIS — Z299 Encounter for prophylactic measures, unspecified: Secondary | ICD-10-CM | POA: Diagnosis not present

## 2021-10-27 DIAGNOSIS — Z789 Other specified health status: Secondary | ICD-10-CM | POA: Diagnosis not present

## 2021-11-12 DIAGNOSIS — Z0100 Encounter for examination of eyes and vision without abnormal findings: Secondary | ICD-10-CM | POA: Diagnosis not present

## 2021-11-12 DIAGNOSIS — H524 Presbyopia: Secondary | ICD-10-CM | POA: Diagnosis not present

## 2021-11-12 DIAGNOSIS — H353212 Exudative age-related macular degeneration, right eye, with inactive choroidal neovascularization: Secondary | ICD-10-CM | POA: Diagnosis not present

## 2021-11-18 NOTE — Progress Notes (Signed)
?[facility] Note ? ?11/24/2021 ?   ? ?CHIEF COMPLAINT ?Patient presents for Retina Follow Up ? ?HISTORY OF PRESENT ILLNESS: ?Darlene Crawford is a 81 y.o. female who presents to the clinic today for:  ? ?HPI   ? ? Retina Follow Up   ?Patient presents with  Wet AMD.  In right eye.  Duration of 10 weeks.  Since onset it is stable.  I, the attending physician,  performed the HPI with the patient and updated documentation appropriately. ? ?  ?  ? ? Comments   ?10 week follow up Exu ARMD OD- Vision appears the same.  She updated her lenses last week.  Dr. Rosana Hoes said there wasn't much change.  ? ?  ?  ?Last edited by Bernarda Caffey, MD on 11/24/2021  2:20 PM.  ?  ?Pt states she has gotten new lenses for her glasses, no change in vision ? ?Referring physician: ?Berenice Primas ?[address] ?[place],  [place] [address] ? ?HISTORICAL INFORMATION:  ?Selected notes from the [facility] ?Referred by Dr. Leander Rams for concern of ARMD OD with progression to CNVM vs CSR  ? ?CURRENT MEDICATIONS: ?Current Outpatient Medications (Ophthalmic Drugs)  ?Medication Sig  ? ARTIFICIAL TEAR SOLUTION OP Place 1 drop into both eyes daily as needed (dry eyes).  ? ?Current Facility-Administered Medications (Ophthalmic Drugs)  ?Medication Route  ? aflibercept (EYLEA) SOLN 2 mg Intravitreal  ? aflibercept (EYLEA) SOLN 2 mg Intravitreal  ? aflibercept (EYLEA) SOLN 2 mg Intravitreal  ? ?Current Outpatient Medications (Other)  ?Medication Sig  ? atorvastatin (LIPITOR) 10 MG tablet Take 10 mg by mouth at bedtime.   ? Cholecalciferol (VITAMIN D) 50 MCG (2000 UT) tablet Take 4,000 Units by mouth at bedtime.   ? Cyanocobalamin (B-12 PO) Take 1 Dose by mouth 2 (two) times a week.  ? fluticasone (FLONASE) 50 MCG/ACT nasal spray Place 1 spray into both nostrils daily as needed for allergies or rhinitis.  ? hydrochlorothiazide (HYDRODIURIL) 25 MG tablet Take 25 mg by mouth daily. 1/2 tablet am, 1/2 tablet pm  ? levothyroxine  (SYNTHROID, LEVOTHROID) 100 MCG tablet Take 100 mcg by mouth daily before breakfast.  ? losartan (COZAAR) 100 MG tablet Take 100 mg by mouth daily.   ? Melatonin 10 MG CAPS Take 10 mg by mouth at bedtime.  ? Multiple Vitamins-Minerals (PRESERVISION AREDS 2 PO) Take 1 tablet by mouth at bedtime.   ? sertraline (ZOLOFT) 50 MG tablet Take 50 mg by mouth daily.  ? sodium chloride (OCEAN) 0.65 % SOLN nasal spray Place 1 spray into both nostrils as needed for congestion.  ? acidophilus (RISAQUAD) CAPS capsule Take 1 capsule by mouth daily. (Patient not taking: Reported on 02/16/2021)  ? loratadine (CLARITIN) 10 MG tablet Take 10 mg by mouth daily as needed for allergies.  (Patient not taking: Reported on 11/24/2021)  ? ?Current Facility-Administered Medications (Other)  ?Medication Route  ? Bevacizumab (AVASTIN) SOLN 1.25 mg Intravitreal  ? Bevacizumab (AVASTIN) SOLN 1.25 mg Intravitreal  ? Bevacizumab (AVASTIN) SOLN 1.25 mg Intravitreal  ? ?REVIEW OF SYSTEMS: ?ROS   ?Positive for: Genitourinary, Musculoskeletal, Endocrine, Eyes, Psychiatric, Allergic/Imm ?Negative for: Constitutional, Gastrointestinal, Neurological, Skin, HENT, Cardiovascular, Respiratory, Heme/Lymph ?Last edited by Leonie Douglas, [place] on 11/24/2021  1:22 PM.  ?  ? ?ALLERGIES ?Allergies  ?Allergen Reactions  ? Trilyte [Peg 3350-Kcl-Na Bicarb-Nacl]   ?  HEMATEMESIS  ? Vicodin [Hydrocodone-Acetaminophen] Nausea And Vomiting  ? ?PAST MEDICAL HISTORY ?Past  Medical History:  ?Diagnosis Date  ? Anxiety and depression   ? Cataract   ? Combined forms OU  ? Hypercholesterolemia   ? Hypertension   ? Hypertensive retinopathy   ? OU  ? Hypothyroidism   ? Macular degeneration   ? OD  ? Osteoarthritis   ? Seasonal allergies   ? ?Past Surgical History:  ?Procedure Laterality Date  ? BIOPSY  10/01/2018  ? Procedure: BIOPSY;  Surgeon: Danie Binder, MD;  [facility];  Service: Endoscopy;;  gastric bx & gastric polyp  ? COLONOSCOPY N/A 10/01/2018  ? Procedure:  COLONOSCOPY;  Surgeon: Danie Binder, MD;  [facility];  Service: Endoscopy;  Laterality: N/A;  10:30am  ? ESOPHAGOGASTRODUODENOSCOPY N/A 10/01/2018  ? Procedure: ESOPHAGOGASTRODUODENOSCOPY (EGD);  Surgeon: Danie Binder, MD;  [facility];  Service: Endoscopy;  Laterality: N/A;  ? POLYPECTOMY  10/01/2018  ? Procedure: POLYPECTOMY;  Surgeon: Danie Binder, MD;  [facility];  Service: Endoscopy;;  colon ?  ? TUBAL LIGATION    ? UTERINE FIBROID SURGERY    ? ?FAMILY HISTORY ?Family History  ?Problem Relation Age of Onset  ? Cancer Mother   ?     lung  ? Stroke Father   ? Stroke Brother   ? Colon cancer Neg Hx   ? ?SOCIAL HISTORY ?Social History  ? ?Tobacco Use  ? Smoking status: Former  ?  Types: Cigarettes  ? Smokeless tobacco: Never  ?Vaping Use  ? Vaping Use: Never used  ?Substance Use Topics  ? Alcohol use: No  ? Drug use: No  ?  ? ?  ?OPHTHALMIC EXAM: ? ?Base Eye Exam   ? ? Visual Acuity (Snellen - Linear)   ? ?   Right Left  ? Dist cc 20/40 20/20  ? Dist ph cc NI   ? ? Correction: Glasses  ? ?  ?  ? ? Tonometry (Tonopen, 1:32 PM)   ? ?   Right Left  ? Pressure 14 13  ? ?  ?  ? ? Pupils   ? ?   Dark Light Shape React APD  ? Right 3 2 Round Brisk None  ? Left 3 2 Round Brisk None  ? ?  ?  ? ? Visual Fields (Counting fingers)   ? ?   Left Right  ?  Full Full  ? ?  ?  ? ? Extraocular Movement   ? ?   Right Left  ?  Full Full  ? ?  ?  ? ? Neuro/Psych   ? ? Oriented x3: Yes  ? Mood/Affect: Normal  ? ?  ?  ? ? Dilation   ? ? Right eye: 1.0% Mydriacyl, 2.5% Phenylephrine @ 1:32 PM  ?OD only per pt request ? ?  ?  ? ?  ? ?Slit Lamp and Fundus Exam   ? ? Slit Lamp Exam   ? ?   Right Left  ? Lids/Lashes Dermatochalasis - upper lid Dermatochalasis - upper lid  ? Conjunctiva/Sclera White and quiet White and quiet  ? Cornea Arcus, early nasal band K, trace PEE Arcus, early nasal band K, 1+PEE  ? Anterior Chamber moderate depth, with narrow angle  moderate depth, with narrow angle temporally   ? Iris Round and well dilated Round and well dilated  ? Lens 2-3+ Nuclear sclerosis, 2-3+ Cortical cataract 2+ Nuclear sclerosis, 2+ Cortical cataract  ? Anterior Vitreous Vitreous syneresis, Posterior vitreous detachment,  Weiss ring Vitreous syneresis  ? ?  ?  ? ? Fundus Exam   ? ?   Right Left  ? Disc Pink and Sharp, Compact Pink and Sharp, Tilted disc  ? C/D Ratio 0.3 0.4  ? Macula Flat, good foveal reflex; central SRF - stably resolved; +drusen; +RPE mottling and clumping, small cluster of drusen and RPE mottling inferior fovea, no heme or edema Flat, good foveal reflex, Retinal pigment epithelial mottling, superior para-foveal focal area of RPE atrophy -- stable, rare drusen, No heme or edema  ? Vessels attenuated, Tortuous Vascular attenuation, Tortuous  ? Periphery Attached, peripheral drusen / RPE changes nasally, peripheral cystoid degeneration Attached, very mild lattice at 0430, mild patch of Lattice degeneration at 0600 -- stable, scattered peripheral drusen, peripheral cystoid degeneration  ? ?  ?  ? ?  ? ?Refraction   ? ? Wearing Rx   ? ?   Sphere Cylinder Axis Add  ? Right +2.00 +2.00 160 +2.50  ? Left +2.50 +2.00 020 +2.50  ? ?  ?  ? ?  ? ?IMAGING AND PROCEDURES  ?Imaging and Procedures for 12/11/17 ? ?OCT, Retina - OU - Both Eyes   ? ?   ?Right Eye ?Quality was good. Central Foveal Thickness: 258. Progression has been stable. Findings include no IRF, retinal drusen , normal foveal contour, no SRF (Stable improvement in SRF).  ? ?Left Eye ?Quality was good. Central Foveal Thickness: 277. Progression has been stable. Findings include normal foveal contour, no IRF, no SRF, retinal drusen .  ? ?Notes ?*Images captured and stored on drive ? ?Diagnosis / Impression:  ?OD: NFP; No IRF; Stable improvement in SRF ?OS: NFP, No IRF/SRF ? ?Clinical management:  ?See below ? ?Abbreviations: NFP - Normal foveal profile. CME - cystoid macular edema. PED - pigment epithelial detachment. IRF - intraretinal fluid.  SRF - subretinal fluid. EZ - ellipsoid zone. ERM - epiretinal membrane. ORA - outer retinal atrophy. ORT - outer retinal tubulation. SRHM - subretinal hyper-reflective material ? ? ? ?  ? ?Intravitreal In

## 2021-11-24 ENCOUNTER — Ambulatory Visit (INDEPENDENT_AMBULATORY_CARE_PROVIDER_SITE_OTHER): Payer: Medicare HMO | Admitting: Ophthalmology

## 2021-11-24 ENCOUNTER — Encounter (INDEPENDENT_AMBULATORY_CARE_PROVIDER_SITE_OTHER): Payer: Self-pay | Admitting: Ophthalmology

## 2021-11-24 DIAGNOSIS — H43811 Vitreous degeneration, right eye: Secondary | ICD-10-CM | POA: Diagnosis not present

## 2021-11-24 DIAGNOSIS — H35711 Central serous chorioretinopathy, right eye: Secondary | ICD-10-CM | POA: Diagnosis not present

## 2021-11-24 DIAGNOSIS — H25813 Combined forms of age-related cataract, bilateral: Secondary | ICD-10-CM

## 2021-11-24 DIAGNOSIS — H35033 Hypertensive retinopathy, bilateral: Secondary | ICD-10-CM | POA: Diagnosis not present

## 2021-11-24 DIAGNOSIS — H353211 Exudative age-related macular degeneration, right eye, with active choroidal neovascularization: Secondary | ICD-10-CM

## 2021-11-24 DIAGNOSIS — H35412 Lattice degeneration of retina, left eye: Secondary | ICD-10-CM

## 2021-11-24 DIAGNOSIS — I10 Essential (primary) hypertension: Secondary | ICD-10-CM

## 2021-11-24 MED ORDER — AFLIBERCEPT 2MG/0.05ML IZ SOLN FOR KALEIDOSCOPE
2.0000 mg | INTRAVITREAL | Status: AC | PRN
  Administered 2021-11-24: 2 mg via INTRAVITREAL

## 2022-02-01 DIAGNOSIS — I10 Essential (primary) hypertension: Secondary | ICD-10-CM | POA: Diagnosis not present

## 2022-02-01 DIAGNOSIS — Z299 Encounter for prophylactic measures, unspecified: Secondary | ICD-10-CM | POA: Diagnosis not present

## 2022-02-01 DIAGNOSIS — Z789 Other specified health status: Secondary | ICD-10-CM | POA: Diagnosis not present

## 2022-02-01 DIAGNOSIS — R69 Illness, unspecified: Secondary | ICD-10-CM | POA: Diagnosis not present

## 2022-02-11 NOTE — Progress Notes (Signed)
[facility] Note  02/16/2022     CHIEF COMPLAINT Patient presents for Retina Follow Up  HISTORY OF PRESENT ILLNESS: Darlene Crawford is a 81 y.o. female who presents to the clinic today for:   HPI     Retina Follow Up   Patient presents with  Wet AMD (IVE OD #29 (03.29.23)).  In right eye.  This started years ago.  Duration of 12 weeks.  Since onset it is stable.  I, the attending physician,  performed the HPI with the patient and updated documentation appropriately.        Comments   Patient feels that the vision has remained unchanged since her last visit. She states that she needs more light to read. She states that her bp is under control with medication. She denies seeing flashes but does see floaters especially when reading.       Last edited by Bernarda Caffey, MD on 02/16/2022  4:23 PM.    Pt states she had an eye exam with Dr. Rosana Hoes and was told her cataracts had gotten a little worse, pt feels like she had a hard time reading the eye chart today  Referring physician: Berenice Primas [place],  [place] [address]  HISTORICAL INFORMATION:  Selected notes from the MEDICAL RECORD NUMBER Referred by Dr. Leander Rams for concern of ARMD OD with progression to CNVM vs CSR   CURRENT MEDICATIONS: Current Outpatient Medications (Ophthalmic Drugs)  Medication Sig   ARTIFICIAL TEAR SOLUTION OP Place 1 drop into both eyes daily as needed (dry eyes).   Current Facility-Administered Medications (Ophthalmic Drugs)  Medication Route   aflibercept (EYLEA) SOLN 2 mg Intravitreal   aflibercept (EYLEA) SOLN 2 mg Intravitreal   aflibercept (EYLEA) SOLN 2 mg Intravitreal   Current Outpatient Medications (Other)  Medication Sig   acidophilus (RISAQUAD) CAPS capsule Take 1 capsule by mouth daily.   atorvastatin (LIPITOR) 10 MG tablet Take 10 mg by mouth at bedtime.    Cholecalciferol (VITAMIN D) 50 MCG (2000 UT) tablet Take 4,000 Units by mouth at  bedtime.    Cyanocobalamin (B-12 PO) Take 1 Dose by mouth 2 (two) times a week.   fluticasone (FLONASE) 50 MCG/ACT nasal spray Place 1 spray into both nostrils daily as needed for allergies or rhinitis.   hydrochlorothiazide (HYDRODIURIL) 25 MG tablet Take 25 mg by mouth daily. 1/2 tablet am, 1/2 tablet pm   levothyroxine (SYNTHROID, LEVOTHROID) 100 MCG tablet Take 100 mcg by mouth daily before breakfast.   loratadine (CLARITIN) 10 MG tablet Take 10 mg by mouth daily as needed for allergies.   losartan (COZAAR) 100 MG tablet Take 100 mg by mouth daily.    Melatonin 10 MG CAPS Take 10 mg by mouth at bedtime.   Multiple Vitamins-Minerals (PRESERVISION AREDS 2 PO) Take 1 tablet by mouth at bedtime.    sertraline (ZOLOFT) 50 MG tablet Take 50 mg by mouth daily.   sodium chloride (OCEAN) 0.65 % SOLN nasal spray Place 1 spray into both nostrils as needed for congestion.   Current Facility-Administered Medications (Other)  Medication Route   Bevacizumab (AVASTIN) SOLN 1.25 mg Intravitreal   Bevacizumab (AVASTIN) SOLN 1.25 mg Intravitreal   Bevacizumab (AVASTIN) SOLN 1.25 mg Intravitreal   REVIEW OF SYSTEMS: ROS   Positive for: Genitourinary, Musculoskeletal, Endocrine, Eyes, Psychiatric, Allergic/Imm Negative for: Constitutional, Gastrointestinal, Neurological, Skin, HENT, Cardiovascular, Respiratory, Heme/Lymph Last edited by Annie Paras, COT on 02/16/2022  1:22 PM.  ALLERGIES Allergies  Allergen Reactions   Trilyte [Peg 3350-Kcl-Na Bicarb-Nacl]     HEMATEMESIS   Vicodin [Hydrocodone-Acetaminophen] Nausea And Vomiting   PAST MEDICAL HISTORY Past Medical History:  Diagnosis Date   Anxiety and depression    Cataract    Combined forms OU   Hypercholesterolemia    Hypertension    Hypertensive retinopathy    OU   Hypothyroidism    Macular degeneration    OD   Osteoarthritis    Seasonal allergies    Past Surgical History:  Procedure Laterality Date   BIOPSY  10/01/2018    Procedure: BIOPSY;  Surgeon: Danie Binder, MD;  [facility];  Service: Endoscopy;;  gastric bx & gastric polyp   COLONOSCOPY N/A 10/01/2018   Procedure: COLONOSCOPY;  Surgeon: Danie Binder, MD;  [facility];  Service: Endoscopy;  Laterality: N/A;  10:30am   ESOPHAGOGASTRODUODENOSCOPY N/A 10/01/2018   Procedure: ESOPHAGOGASTRODUODENOSCOPY (EGD);  Surgeon: Danie Binder, MD;  [facility];  Service: Endoscopy;  Laterality: N/A;   POLYPECTOMY  10/01/2018   Procedure: POLYPECTOMY;  Surgeon: Danie Binder, MD;  [facility];  Service: Endoscopy;;  colon    TUBAL LIGATION     UTERINE FIBROID SURGERY     FAMILY HISTORY Family History  Problem Relation Age of Onset   Cancer Mother        lung   Stroke Father    Stroke Brother    Colon cancer Neg Hx    SOCIAL HISTORY Social History   Tobacco Use   Smoking status: Former    Types: Cigarettes   Smokeless tobacco: Never  Vaping Use   Vaping Use: Never used  Substance Use Topics   Alcohol use: No   Drug use: No       OPHTHALMIC EXAM:  Base Eye Exam     Visual Acuity (Snellen - Linear)       Right Left   Dist cc 20/40 +1 20/20 +1   Dist ph cc NI     Correction: Glasses         Tonometry (Tonopen, 1:28 PM)       Right Left   Pressure 11 12         Pupils       Pupils Dark Light Shape React APD   Right PERRL 3 2 Round Brisk None   Left PERRL 3 2 Round Brisk None         Visual Fields       Left Right    Full Full         Extraocular Movement       Right Left    Full, Ortho Full, Ortho         Neuro/Psych     Oriented x3: Yes   Mood/Affect: Normal         Dilation     Both eyes: 1.0% Mydriacyl, 2.5% Phenylephrine @ 1:25 PM           Slit Lamp and Fundus Exam     Slit Lamp Exam       Right Left   Lids/Lashes Dermatochalasis - upper lid Dermatochalasis - upper lid   Conjunctiva/Sclera White and quiet White and quiet   Cornea  Arcus, early nasal band K, trace PEE Arcus, early nasal band K, 1+PEE   Anterior Chamber moderate depth, with narrow angle  moderate depth, with narrow angle temporally   Iris Round and well  dilated Round and well dilated   Lens 3+ Nuclear sclerosis with brunescence, 2-3+ Cortical cataract 2-3+ Nuclear sclerosis, 2-3+ Cortical cataract   Anterior Vitreous Vitreous syneresis, Posterior vitreous detachment, Weiss ring Vitreous syneresis         Fundus Exam       Right Left   Disc Pink and Sharp, Compact Pink and Sharp, Tilted disc   C/D Ratio 0.3 0.3   Macula Flat, good foveal reflex; central SRF - stably resolved; +drusen; +RPE mottling and clumping, small cluster of drusen and RPE mottling inferior fovea, no heme or edema Flat, good foveal reflex, Retinal pigment epithelial mottling, superior para-foveal focal area of RPE atrophy -- stable, rare drusen, No heme or edema   Vessels attenuated, Tortuous, mild AV crossing changes Vascular attenuation, Tortuous   Periphery Attached, peripheral drusen / RPE changes nasally, peripheral cystoid degeneration Attached, very mild lattice at 0430, mild patch of Lattice degeneration at 0600 -- stable, scattered peripheral drusen, peripheral cystoid degeneration           Refraction     Wearing Rx       Sphere Cylinder Axis Add   Right +2.00 +2.00 160 +2.50   Left +2.50 +2.00 020 +2.50           IMAGING AND PROCEDURES  Imaging and Procedures for 12/11/17  OCT, Retina - OU - Both Eyes       Right Eye Quality was good. Central Foveal Thickness: 258. Progression has been stable. Findings include normal foveal contour, no IRF, no SRF, retinal drusen (Stable improvement in SRF).   Left Eye Quality was good. Central Foveal Thickness: 281. Progression has been stable. Findings include normal foveal contour, no IRF, no SRF, retinal drusen .   Notes *Images captured and stored on drive  Diagnosis / Impression:  OD: NFP; No IRF; Stable  improvement in SRF OS: NFP, No IRF/SRF  Clinical management:  See below  Abbreviations: NFP - Normal foveal profile. CME - cystoid macular edema. PED - pigment epithelial detachment. IRF - intraretinal fluid. SRF - subretinal fluid. EZ - ellipsoid zone. ERM - epiretinal membrane. ORA - outer retinal atrophy. ORT - outer retinal tubulation. SRHM - subretinal hyper-reflective material       Intravitreal Injection, Pharmacologic Agent - OD - Right Eye       Time Out 02/16/2022. 2:27 PM. Confirmed correct patient, procedure, site, and patient consented.   Anesthesia Topical anesthesia was used. Anesthetic medications included Lidocaine 2%, Proparacaine 0.5%.   Procedure Preparation included 5% betadine to ocular surface, eyelid speculum. A (32g) needle was used.   Injection: 2 mg aflibercept 2 MG/0.05ML   Route: Intravitreal, Site: Right Eye   NDC: [id number], Lot: [id number], Expiration date: 11/27/2022, Waste: 0 mL   Post-op Post injection exam found visual acuity of at least counting fingers. The patient tolerated the procedure well. There were no complications. The patient received written and verbal post procedure care education. Post injection medications were not given.            ASSESSMENT/PLAN:    ICD-10-CM   1. Exudative age-related macular degeneration of right eye with active choroidal neovascularization (HCC)  H35.3211 OCT, Retina - OU - Both Eyes    Intravitreal Injection, Pharmacologic Agent - OD - Right Eye    aflibercept (EYLEA) SOLN 2 mg    2. 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 Essential hypertension  I10     4. Hypertensive retinopathy  of both eyes  H35.033     5. 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 Posterior vitreous detachment of right eye  H43.811     7. Combined forms of age-related cataract of both eyes  H25.813      1,2. Exudative age related macular degeneration vs CSCR OD  - Prior history of  worsening fluid when delayed f/u to 6 wks    - FA (03.02.19) without leakage / pooling into area of SRF  - review of systems reveals significant stressors in life, but no exogenous steroid use  - discussed treatment with anti-VEGF vs po Eplerenone  - S/P IVA #1 OD (09.27.19),  #2 (10.25.19), #3 (11.22.19) -- minimal response  - pt approved for [employer] and IVE  - S/P IVE OD #1 (12.20.19), #2 (01.20.20), #3 (02.18.20), #4 (07.20.20) #5 (09.01.20), #6 (10.13.20), #7 (11.20.20), #8 (01.20.21), #9 (02.17.21), #10 (03.19.21), #11 (04.20.21), #12 (05.25.21), #13 (6.23.21), #14 (07.27.21), #15 (08.30.21), #16 (9.28.21), #17 (10.26.21), #18 (11.29.21), #19 (01.11.22), #20 (02.08.22), #21 (3.8.22), #22 (04.05.22), #23 (5.10.22), #24 (06.21.22), #25 (8.2.22), #26 (9.20.22), #27 (11.15.22), #28 (01.17.23), #29 (03.29.23)  - lost to f/u from Feb 2020 to July 2020 due to COVID-19 concerns  - today, VA stable at 20/40 OD   - OCT today shows stable resolution of SRF at 12 weeks  - review of OCTs shows last time she had SRF was Jan 2022  - recommend IVE OD #30 today, 06.21.23 -- maintenance w/ ext f/u to 14 wks  - pt wishes to proceed with IVE OD  - RBA of procedure discussed, questions answered  - informed consent obtained   - Eylea informed consent form signed and scanned on 01.17.23  - see procedure note  - [employer] benefits investigation initiated -- approved for 2022  - f/u 14 weeks -- DFE/OCT/ possible injection  vs transition to PRN tx  3,4. Hypertensive retinopathy OU  - discussed importance of tight BP control  - stable  - monitor  5. Lattice degeneration OS-  - patch of inferior lattice OS -- no RT, holes or SRF  - asymptomatic   - discussed findings, prognosis, and treatment options including observation  - monitor  6. PVD / vitreous syneresis OD-   - Discussed findings and prognosis  - No RT or RD on 360 peripheral exam  - Reviewed s/s of RT/RD  - Strict return precautions for any such  RT/RD signs/symptoms  7. Combined form age-related cataract OU-   - The symptoms of cataract, surgical options, and treatments and risks were discussed with patient.  - discussed diagnosis and progression  - approaching visual signficance  - monitor  Ophthalmic Meds Ordered this visit:  Meds ordered this encounter  Medications   aflibercept (EYLEA) SOLN 2 mg     Return in about 14 weeks (around 05/25/2022) for f/u exu ARMD OD, DFE, OCT.  There are no Patient Instructions on file for this visit.   This document serves as a record of services personally performed by Gardiner Sleeper, MD, PhD. It was created on their behalf by Orvan Falconer, an ophthalmic technician. The creation of this record is the provider's dictation and/or activities during the visit.    Electronically signed by: Orvan Falconer, OA, 02/16/22  4:51 PM  Gardiner Sleeper, M.D., Ph.D. Diseases & Surgery of the Retina and Vitreous [facility]  I have reviewed the above documentation for accuracy and completeness, and I agree  with the above. Gardiner Sleeper, M.D., Ph.D. 11/24/21 4:51 PM  Abbreviations: M myopia (nearsighted); A astigmatism; H hyperopia (farsighted); P presbyopia; Mrx spectacle prescription;  CTL contact lenses; OD right eye; OS left eye; OU both eyes  XT exotropia; ET esotropia; PEK punctate epithelial keratitis; PEE punctate epithelial erosions; DES dry eye syndrome; MGD meibomian gland dysfunction; ATs artificial tears; PFAT's preservative free artificial tears; [place] nuclear sclerotic cataract; PSC posterior subcapsular cataract; ERM epi-retinal membrane; PVD posterior vitreous detachment; RD retinal detachment; DM diabetes mellitus; DR diabetic retinopathy; NPDR non-proliferative diabetic retinopathy; PDR proliferative diabetic retinopathy; CSME clinically significant macular edema; DME diabetic macular edema; dbh dot blot hemorrhages; CWS cotton wool spot; POAG primary open angle  glaucoma; C/D cup-to-disc ratio; HVF humphrey visual field; GVF goldmann visual field; OCT optical coherence tomography; IOP intraocular pressure; BRVO Branch retinal vein occlusion; CRVO central retinal vein occlusion; CRAO central retinal artery occlusion; BRAO branch retinal artery occlusion; RT retinal tear; SB scleral buckle; PPV pars plana vitrectomy; VH Vitreous hemorrhage; PRP panretinal laser photocoagulation; IVK intravitreal kenalog; VMT vitreomacular traction; MH Macular hole;  NVD neovascularization of the disc; NVE neovascularization elsewhere; AREDS age related eye disease study; ARMD age related macular degeneration; POAG primary open angle glaucoma; EBMD epithelial/anterior basement membrane dystrophy; ACIOL anterior chamber intraocular lens; IOL intraocular lens; PCIOL posterior chamber intraocular lens; Phaco/IOL phacoemulsification with intraocular lens placement; [place] photorefractive keratectomy; LASIK laser assisted in situ keratomileusis; HTN hypertension; DM diabetes mellitus; COPD chronic obstructive pulmonary disease

## 2022-02-16 ENCOUNTER — Encounter (INDEPENDENT_AMBULATORY_CARE_PROVIDER_SITE_OTHER): Payer: Self-pay | Admitting: Ophthalmology

## 2022-02-16 ENCOUNTER — Ambulatory Visit (INDEPENDENT_AMBULATORY_CARE_PROVIDER_SITE_OTHER): Payer: Medicare HMO | Admitting: Ophthalmology

## 2022-02-16 DIAGNOSIS — H353211 Exudative age-related macular degeneration, right eye, with active choroidal neovascularization: Secondary | ICD-10-CM

## 2022-02-16 DIAGNOSIS — H43811 Vitreous degeneration, right eye: Secondary | ICD-10-CM

## 2022-02-16 DIAGNOSIS — H35711 Central serous chorioretinopathy, right eye: Secondary | ICD-10-CM

## 2022-02-16 DIAGNOSIS — H25813 Combined forms of age-related cataract, bilateral: Secondary | ICD-10-CM

## 2022-02-16 DIAGNOSIS — H35412 Lattice degeneration of retina, left eye: Secondary | ICD-10-CM | POA: Diagnosis not present

## 2022-02-16 DIAGNOSIS — H35033 Hypertensive retinopathy, bilateral: Secondary | ICD-10-CM

## 2022-02-16 DIAGNOSIS — I10 Essential (primary) hypertension: Secondary | ICD-10-CM

## 2022-02-16 MED ORDER — AFLIBERCEPT 2MG/0.05ML IZ SOLN FOR KALEIDOSCOPE
2.0000 mg | INTRAVITREAL | Status: AC | PRN
  Administered 2022-02-16: 2 mg via INTRAVITREAL

## 2022-03-23 ENCOUNTER — Telehealth: Payer: Self-pay | Admitting: *Deleted

## 2022-03-23 NOTE — Patient Outreach (Signed)
  Care Management   Outreach Note  03/23/2022  Name: Darlene Crawford MRN: [id number] DOB: Jun 14, 1941  Reason for Referral: Care Coordination - Assessment of Needs.   An unsuccessful telephone outreach was attempted today. The patient was referred to the case management team for assistance with care management and care coordination. A HIPAA compliant message was left on voicemail for patient, providing contact information for CSW, encouraging patient to return the call at her earliest convenience.   Follow Up Plan:  [place] will reschedule initial telephone outreach call for patient with CSW.   Nat Christen, BSW, MSW, LCSW  Licensed Education officer, [facility]  Mailing [place] [address] [place], [place] [address] Physical Address-[address] [place], [place] [address] Toll Free Main # [phone] Fax # [phone] Cell # [phone] [email or web address]

## 2022-04-20 ENCOUNTER — Other Ambulatory Visit (HOSPITAL_COMMUNITY): Payer: Self-pay | Admitting: Nurse Practitioner

## 2022-04-20 DIAGNOSIS — E039 Hypothyroidism, unspecified: Secondary | ICD-10-CM | POA: Diagnosis not present

## 2022-04-20 DIAGNOSIS — E7800 Pure hypercholesterolemia, unspecified: Secondary | ICD-10-CM | POA: Diagnosis not present

## 2022-04-20 DIAGNOSIS — Z1231 Encounter for screening mammogram for malignant neoplasm of breast: Secondary | ICD-10-CM

## 2022-04-20 DIAGNOSIS — Z1331 Encounter for screening for depression: Secondary | ICD-10-CM | POA: Diagnosis not present

## 2022-04-20 DIAGNOSIS — Z79899 Other long term (current) drug therapy: Secondary | ICD-10-CM | POA: Diagnosis not present

## 2022-04-20 DIAGNOSIS — Z6834 Body mass index (BMI) 34.0-34.9, adult: Secondary | ICD-10-CM | POA: Diagnosis not present

## 2022-04-20 DIAGNOSIS — Z299 Encounter for prophylactic measures, unspecified: Secondary | ICD-10-CM | POA: Diagnosis not present

## 2022-04-20 DIAGNOSIS — I10 Essential (primary) hypertension: Secondary | ICD-10-CM | POA: Diagnosis not present

## 2022-04-20 DIAGNOSIS — Z0000 Encounter for general adult medical examination without abnormal findings: Secondary | ICD-10-CM | POA: Diagnosis not present

## 2022-04-20 DIAGNOSIS — Z7189 Other specified counseling: Secondary | ICD-10-CM | POA: Diagnosis not present

## 2022-04-20 DIAGNOSIS — Z789 Other specified health status: Secondary | ICD-10-CM | POA: Diagnosis not present

## 2022-04-20 DIAGNOSIS — Z1339 Encounter for screening examination for other mental health and behavioral disorders: Secondary | ICD-10-CM | POA: Diagnosis not present

## 2022-04-25 DIAGNOSIS — I10 Essential (primary) hypertension: Secondary | ICD-10-CM | POA: Diagnosis not present

## 2022-04-25 DIAGNOSIS — U071 COVID-19: Secondary | ICD-10-CM | POA: Diagnosis not present

## 2022-04-25 DIAGNOSIS — N1831 Chronic kidney disease, stage 3a: Secondary | ICD-10-CM | POA: Diagnosis not present

## 2022-04-25 DIAGNOSIS — Z299 Encounter for prophylactic measures, unspecified: Secondary | ICD-10-CM | POA: Diagnosis not present

## 2022-04-28 ENCOUNTER — Ambulatory Visit (HOSPITAL_COMMUNITY): Payer: Medicare HMO

## 2022-05-11 ENCOUNTER — Encounter: Payer: Self-pay | Admitting: *Deleted

## 2022-05-11 ENCOUNTER — Telehealth: Payer: Self-pay | Admitting: *Deleted

## 2022-05-11 NOTE — Patient Instructions (Signed)
Visit Information  Thank you for taking time to visit with me today. Please don't hesitate to contact me if I can be of assistance to you.   Following are the goals we discussed today:  Call to reschedule mammogram  Please call the Suicide and Crisis Lifeline: 988 call the [place] National Suicide Prevention Lifeline: 570 165 6108 or TTY: 2163222295 TTY (470) 145-7930) to talk to a trained counselor call 1-800-273-TALK (toll free, 24 hour hotline) call the [facility]: [phone]) 724-7322 call 911 if you are experiencing a Mental Health or [place] or need someone to talk to.  The patient verbalized understanding of instructions, educational materials, and care plan provided today and agreed to receive a mailed copy of patient instructions, educational materials, and care plan.   The patient has been provided with contact information for the care management team and has been advised to call with any health related questions or concerns.   Valente David, RN, MSN, [place] Care Management Care Management Coordinator [phone]

## 2022-05-11 NOTE — Patient Outreach (Signed)
  Care Coordination   Initial Visit Note   05/11/2022 Name: Darlene Crawford MRN: [id number] DOB: 07-30-1941  Darlene Crawford is a 81 y.o. year old female who sees [place], [place] [facility] for primary care. I spoke with  Darlene Crawford by phone today.  What matters to the patients health and wellness today?  Member report she is doing well, had AWV with PCP a few week ago.  Creatinine was slightly elevated, will increase water intake.  Follow up with PCP in 3 months.  Feels she is managing health independently at this time, does not need further follow up but will call if this changes. Denies any urgent concerns, encouraged to contact this care manager with questions.      Goals Addressed             This Visit's Progress    COMPLETED: Care Coordination Activities - No follow up needed       Care Coordination Interventions: Patient interviewed about adult health maintenance status including  Colonoscopy    Cholesterol/Lipid screen      Falls risk assessment    Pneumonia Vaccine Influenza Vaccine Regular eye checkups Regular Dental Care    Blood Pressure    Advised patient to discuss  COVID vaccination    with primary care provider  Provided education about Overall Health Maintenance SDOH assessment completed         SDOH assessments and interventions completed:  Yes  SDOH Interventions Today    Flowsheet Row Most Recent Value  SDOH Interventions   Food Insecurity Interventions Intervention Not Indicated  Housing Interventions Intervention Not Indicated  Transportation Interventions Intervention Not Indicated  Utilities Interventions Intervention Not Indicated        Care Coordination Interventions Activated:  Yes  Care Coordination Interventions:  Yes, provided   Follow up plan: No further intervention required.   Encounter Outcome:  Pt. Visit Completed   Valente David, RN, MSN, [place] Care Management Care Management Coordinator [phone]

## 2022-05-20 NOTE — Progress Notes (Signed)
[facility] Note  05/25/2022     CHIEF COMPLAINT Patient presents for Retina Follow Up  HISTORY OF PRESENT ILLNESS: Darlene Crawford is a 81 y.o. female who presents to the clinic today for:   HPI     Retina Follow Up   Patient presents with  Wet AMD (IVE OD #29 (03.29.23)).  In right eye.  This started years ago.  Duration of 14 weeks.  Since onset it is stable.  I, the attending physician,  performed the HPI with the patient and updated documentation appropriately.        Comments   Patient states that the vision in the right eye is getting worse. The vision is is fuzzy.      Last edited by Bernarda Caffey, MD on 05/25/2022  4:08 PM.    Pt feels like her cataract is getting worse  Referring physician: Leticia Clas, [address] [place],  [place] [address]  HISTORICAL INFORMATION:  Selected notes from the MEDICAL RECORD NUMBER Referred by Dr. Leander Rams for concern of ARMD OD with progression to CNVM vs CSR   CURRENT MEDICATIONS: Current Outpatient Medications (Ophthalmic Drugs)  Medication Sig   ARTIFICIAL TEAR SOLUTION OP Place 1 drop into both eyes daily as needed (dry eyes).   Current Facility-Administered Medications (Ophthalmic Drugs)  Medication Crawford   aflibercept (EYLEA) SOLN 2 mg Intravitreal   aflibercept (EYLEA) SOLN 2 mg Intravitreal   aflibercept (EYLEA) SOLN 2 mg Intravitreal   Current Outpatient Medications (Other)  Medication Sig   acidophilus (RISAQUAD) CAPS capsule Take 1 capsule by mouth daily.   atorvastatin (LIPITOR) 10 MG tablet Take 10 mg by mouth at bedtime.    Cholecalciferol (VITAMIN D) 50 MCG (2000 UT) tablet Take 4,000 Units by mouth at bedtime.    Cyanocobalamin (B-12 PO) Take 1 Dose by mouth 2 (two) times a week.   fluticasone (FLONASE) 50 MCG/ACT nasal spray Place 1 spray into both nostrils daily as needed for allergies or rhinitis.   hydrochlorothiazide (HYDRODIURIL) 25 MG tablet Take 25 mg by  mouth daily. 1/2 tablet am, 1/2 tablet pm   levothyroxine (SYNTHROID, LEVOTHROID) 100 MCG tablet Take 100 mcg by mouth daily before breakfast.   loratadine (CLARITIN) 10 MG tablet Take 10 mg by mouth daily as needed for allergies.   losartan (COZAAR) 100 MG tablet Take 100 mg by mouth daily.    Melatonin 10 MG CAPS Take 10 mg by mouth at bedtime.   Multiple Vitamins-Minerals (PRESERVISION AREDS 2 PO) Take 1 tablet by mouth at bedtime.    sertraline (ZOLOFT) 50 MG tablet Take 50 mg by mouth daily.   sodium chloride (OCEAN) 0.65 % SOLN nasal spray Place 1 spray into both nostrils as needed for congestion.   Current Facility-Administered Medications (Other)  Medication Crawford   Bevacizumab (AVASTIN) SOLN 1.25 mg Intravitreal   Bevacizumab (AVASTIN) SOLN 1.25 mg Intravitreal   Bevacizumab (AVASTIN) SOLN 1.25 mg Intravitreal   REVIEW OF SYSTEMS: ROS   Positive for: Genitourinary, Musculoskeletal, Endocrine, Eyes, Psychiatric, Allergic/Imm Negative for: Constitutional, Gastrointestinal, Neurological, Skin, HENT, Cardiovascular, Respiratory, Heme/Lymph Last edited by Annie Paras, COT on 05/25/2022  1:41 PM.     ALLERGIES Allergies  Allergen Reactions   Trilyte [Peg 3350-Kcl-Na Bicarb-Nacl]     HEMATEMESIS   Vicodin [Hydrocodone-Acetaminophen] Nausea And Vomiting   PAST MEDICAL HISTORY Past Medical History:  Diagnosis Date   Anxiety and depression    Cataract  Combined forms OU   Hypercholesterolemia    Hypertension    Hypertensive retinopathy    OU   Hypothyroidism    Macular degeneration    OD   Osteoarthritis    Seasonal allergies    Past Surgical History:  Procedure Laterality Date   BIOPSY  10/01/2018   Procedure: BIOPSY;  Surgeon: Danie Binder, MD;  [facility]: [facility];  Service: Endoscopy;;  gastric bx & gastric polyp   COLONOSCOPY N/A 10/01/2018   Procedure: COLONOSCOPY;  Surgeon: Danie Binder, MD;  [facility]: [facility];  Service: Endoscopy;   Laterality: N/A;  10:30am   ESOPHAGOGASTRODUODENOSCOPY N/A 10/01/2018   Procedure: ESOPHAGOGASTRODUODENOSCOPY (EGD);  Surgeon: Danie Binder, MD;  [facility]: [facility];  Service: Endoscopy;  Laterality: N/A;   POLYPECTOMY  10/01/2018   Procedure: POLYPECTOMY;  Surgeon: Danie Binder, MD;  [facility]: [facility];  Service: Endoscopy;;  colon    TUBAL LIGATION     UTERINE FIBROID SURGERY     FAMILY HISTORY Family History  Problem Relation Age of Onset   Cancer Mother        lung   Stroke Father    Stroke Brother    Colon cancer Neg Hx    SOCIAL HISTORY Social History   Tobacco Use   Smoking status: Former    Types: Cigarettes   Smokeless tobacco: Never  Vaping Use   Vaping Use: Never used  Substance Use Topics   Alcohol use: No   Drug use: No       OPHTHALMIC EXAM:  Base Eye Exam     Visual Acuity (Snellen - Linear)       Right Left   Dist cc 20/80 20/20 +1   Dist ph cc 20/40 +2     Correction: Glasses         Tonometry (Tonopen, 1:44 PM)       Right Left   Pressure 11 8         Pupils       Dark Light Shape React APD   Right 3 2 Round Brisk None   Left 3 2 Round Brisk None         Visual Fields       Left Right    Full Full         Extraocular Movement       Right Left    Full, Ortho Full, Ortho         Neuro/Psych     Oriented x3: Yes   Mood/Affect: Normal         Dilation     Right eye: 1.0% Mydriacyl, 2.5% Phenylephrine @ 1:42 PM           Slit Lamp and Fundus Exam     Slit Lamp Exam       Right Left   Lids/Lashes Dermatochalasis - upper lid Dermatochalasis - upper lid   Conjunctiva/Sclera White and quiet White and quiet   Cornea Arcus, early nasal band K, trace PEE Arcus, early nasal band K, 1+PEE   Anterior Chamber moderate depth, with narrow angle  moderate depth, with narrow angle temporally   Iris Round and well dilated Round and well dilated   Lens 3+ Nuclear sclerosis with brunescence, 2-3+  Cortical cataract 2-3+ Nuclear sclerosis, 2-3+ Cortical cataract   Anterior Vitreous Vitreous syneresis, Posterior vitreous detachment, Weiss ring Vitreous syneresis         Fundus Exam  Right Left   Disc Pink and Sharp, Compact Pink and Sharp, Tilted disc   C/D Ratio 0.2 0.3   Macula Flat, good foveal reflex; central SRF - stably resolved; +drusen; +RPE mottling and clumping, small cluster of drusen and RPE mottling inferior fovea, no heme or edema Flat, good foveal reflex, Retinal pigment epithelial mottling, superior para-foveal focal area of RPE atrophy -- stable, rare drusen, No heme or edema   Vessels attenuated, Tortuous, mild AV crossing changes Vascular attenuation, Tortuous   Periphery Attached, peripheral drusen / RPE changes nasally, peripheral cystoid degeneration Attached, very mild lattice at 0430, mild patch of Lattice degeneration at 0600 -- stable, scattered peripheral drusen, peripheral cystoid degeneration           Refraction     Wearing Rx       Sphere Cylinder Axis Add   Right +2.00 +2.00 160 +2.50   Left +2.50 +2.00 020 +2.50           IMAGING AND PROCEDURES  Imaging and Procedures for 12/11/17  OCT, Retina - OU - Both Eyes       Right Eye Quality was good. Central Foveal Thickness: 254. Progression has been stable. Findings include normal foveal contour, no IRF, no SRF, retinal drusen , outer retinal atrophy (Stable improvement in SRF).   Left Eye Quality was good. Central Foveal Thickness: 274. Progression has been stable. Findings include normal foveal contour, no IRF, no SRF, retinal drusen .   Notes *Images captured and stored on drive  Diagnosis / Impression:  OD: NFP; No IRF; Stable improvement in SRF OS: NFP, No IRF/SRF  Clinical management:  See below  Abbreviations: NFP - Normal foveal profile. CME - cystoid macular edema. PED - pigment epithelial detachment. IRF - intraretinal fluid. SRF - subretinal fluid. EZ - ellipsoid  zone. ERM - epiretinal membrane. ORA - outer retinal atrophy. ORT - outer retinal tubulation. SRHM - subretinal hyper-reflective material       Intravitreal Injection, Pharmacologic Agent - OD - Right Eye       Time Out 05/25/2022. 2:24 PM. Confirmed correct patient, procedure, site, and patient consented.   Anesthesia Topical anesthesia was used. Anesthetic medications included Lidocaine 2%, Proparacaine 0.5%.   Procedure Preparation included 5% betadine to ocular surface, eyelid speculum. A (32g) needle was used.   Injection: 2 mg aflibercept 2 MG/0.05ML   Crawford: Intravitreal, Site: Right Eye   NDC: [id number], Lot: [id number], Expiration date: 01/27/2023, Waste: 0 mL   Post-op Post injection exam found visual acuity of at least counting fingers. The patient tolerated the procedure well. There were no complications. The patient received written and verbal post procedure care education. Post injection medications were not given.            ASSESSMENT/PLAN:    ICD-10-CM   1. Exudative age-related macular degeneration of right eye with active choroidal neovascularization (HCC)  H35.3211 OCT, Retina - OU - Both Eyes    Intravitreal Injection, Pharmacologic Agent - OD - Right Eye    aflibercept (EYLEA) SOLN 2 mg    2. 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 Essential hypertension  I10     4. Hypertensive retinopathy of both eyes  H35.033     5. 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 Posterior vitreous detachment of right eye  H43.811     7. Combined forms of age-related cataract of both eyes  H25.813  1,2. Exudative age related macular degeneration vs CSCR OD  - FA (03.02.19) without leakage / pooling into area of SRF  - review of systems reveals significant stressors in life, but no exogenous steroid use  - S/P IVA #1 OD (09.27.19),  #2 (10.25.19), #3 (11.22.19) -- minimal response -- IVA resistance  - pt approved for  [employer] and IVE  - S/P IVE OD #1 (12.20.19), #2 (01.20.20), #3 (02.18.20), #4 (07.20.20) #5 (09.01.20), #6 (10.13.20), #7 (11.20.20), #8 (01.20.21), #9 (02.17.21), #10 (03.19.21), #11 (04.20.21), #12 (05.25.21), #13 (6.23.21), #14 (07.27.21), #15 (08.30.21), #16 (9.28.21), #17 (10.26.21), #18 (11.29.21), #19 (01.11.22), #20 (02.08.22), #21 (3.8.22), #22 (04.05.22), #23 (5.10.22), #24 (06.21.22), #25 (8.2.22), #26 (9.20.22), #27 (11.15.22), #28 (01.17.23), #29 (03.29.23), #30 (06.21.23)  - lost to f/u from Feb 2020 to July 2020 due to COVID-19 concerns  - today, [facility] stable at 20/40 OD   - OCT today shows stable resolution of SRF at 14 weeks  - review of OCTs shows last time she had SRF was Jan 2022  - recommend IVE OD #31 today, 09.27.23 -- maintenance w/ ext f/u to 16 wks  - pt wishes to proceed with IVE OD  - RBA of procedure discussed, questions answered  - informed consent obtained   - Eylea informed consent form signed and scanned on 01.17.23  - see procedure note  - [email or web address] benefits investigation initiated -- approved for 2022  - f/u 16 weeks -- DFE/OCT/ possible injection  - will likely stay on a q16 wk maintenance schedule  3,4. Hypertensive retinopathy OU  - discussed importance of tight BP control  - stable  - monitor  5. Lattice degeneration OS-  - patch of inferior lattice OS -- no RT, holes or SRF  - asymptomatic   - discussed findings, prognosis, and treatment options including observation  - monitor  6. PVD / vitreous syneresis OD-   - Discussed findings and prognosis  - No RT or RD on 360 peripheral exam  - Reviewed s/s of RT/RD  - Strict return precautions for any such RT/RD signs/symptoms  7. Combined form age-related cataract OU-   - The symptoms of cataract, surgical options, and treatments and risks were discussed with patient.  - discussed diagnosis and progression  - will refer to [facility] for cataract evaluation and management  Ophthalmic Meds  Ordered this visit:  Meds ordered this encounter  Medications   aflibercept (EYLEA) SOLN 2 mg     Return in about 16 weeks (around 09/14/2022) for f/u exu ARMD OU, DFE, OCT.  There are no Patient Instructions on file for this visit.   This document serves as a record of services personally performed by Gardiner Sleeper, MD, PhD. It was created on their behalf by Orvan Falconer, an ophthalmic technician. The creation of this record is the provider's dictation and/or activities during the visit.    Electronically signed by: Orvan Falconer, OA, 05/25/22  4:10 PM  Gardiner Sleeper, M.D., Ph.D. Diseases & Surgery of the Retina and Vitreous [facility]  I have reviewed the above documentation for accuracy and completeness, and I agree with the above. Gardiner Sleeper, M.D., Ph.D. 05/25/22 4:15 PM   Abbreviations: M myopia (nearsighted); A astigmatism; H hyperopia (farsighted); P presbyopia; Mrx spectacle prescription;  CTL contact lenses; OD right eye; OS left eye; OU both eyes  XT exotropia; ET esotropia; PEK punctate epithelial keratitis; PEE punctate epithelial erosions; DES dry eye syndrome; MGD meibomian  gland dysfunction; ATs artificial tears; PFAT's preservative free artificial tears; [place] nuclear sclerotic cataract; PSC posterior subcapsular cataract; ERM epi-retinal membrane; PVD posterior vitreous detachment; RD retinal detachment; DM diabetes mellitus; DR diabetic retinopathy; NPDR non-proliferative diabetic retinopathy; PDR proliferative diabetic retinopathy; CSME clinically significant macular edema; DME diabetic macular edema; dbh dot blot hemorrhages; CWS cotton wool spot; POAG primary open angle glaucoma; C/D cup-to-disc ratio; HVF humphrey visual field; GVF goldmann visual field; OCT optical coherence tomography; IOP intraocular pressure; BRVO Branch retinal vein occlusion; CRVO central retinal vein occlusion; CRAO central retinal artery occlusion; BRAO branch  retinal artery occlusion; RT retinal tear; SB scleral buckle; PPV pars plana vitrectomy; VH Vitreous hemorrhage; PRP panretinal laser photocoagulation; IVK intravitreal kenalog; VMT vitreomacular traction; MH Macular hole;  NVD neovascularization of the disc; NVE neovascularization elsewhere; AREDS age related eye disease study; ARMD age related macular degeneration; POAG primary open angle glaucoma; EBMD epithelial/anterior basement membrane dystrophy; ACIOL anterior chamber intraocular lens; IOL intraocular lens; PCIOL posterior chamber intraocular lens; Phaco/IOL phacoemulsification with intraocular lens placement; [place] photorefractive keratectomy; LASIK laser assisted in situ keratomileusis; HTN hypertension; DM diabetes mellitus; COPD chronic obstructive pulmonary disease

## 2022-05-25 ENCOUNTER — Encounter (INDEPENDENT_AMBULATORY_CARE_PROVIDER_SITE_OTHER): Payer: Self-pay | Admitting: Ophthalmology

## 2022-05-25 ENCOUNTER — Ambulatory Visit (INDEPENDENT_AMBULATORY_CARE_PROVIDER_SITE_OTHER): Payer: Medicare HMO | Admitting: Ophthalmology

## 2022-05-25 DIAGNOSIS — H353211 Exudative age-related macular degeneration, right eye, with active choroidal neovascularization: Secondary | ICD-10-CM

## 2022-05-25 DIAGNOSIS — H35033 Hypertensive retinopathy, bilateral: Secondary | ICD-10-CM

## 2022-05-25 DIAGNOSIS — I10 Essential (primary) hypertension: Secondary | ICD-10-CM | POA: Diagnosis not present

## 2022-05-25 DIAGNOSIS — H25813 Combined forms of age-related cataract, bilateral: Secondary | ICD-10-CM

## 2022-05-25 DIAGNOSIS — H35711 Central serous chorioretinopathy, right eye: Secondary | ICD-10-CM

## 2022-05-25 DIAGNOSIS — H43811 Vitreous degeneration, right eye: Secondary | ICD-10-CM

## 2022-05-25 DIAGNOSIS — H35412 Lattice degeneration of retina, left eye: Secondary | ICD-10-CM

## 2022-05-25 MED ORDER — AFLIBERCEPT 2MG/0.05ML IZ SOLN FOR KALEIDOSCOPE
2.0000 mg | INTRAVITREAL | Status: AC | PRN
  Administered 2022-05-25: 2 mg via INTRAVITREAL

## 2022-05-31 DIAGNOSIS — Z23 Encounter for immunization: Secondary | ICD-10-CM | POA: Diagnosis not present

## 2022-06-13 DIAGNOSIS — J40 Bronchitis, not specified as acute or chronic: Secondary | ICD-10-CM | POA: Diagnosis not present

## 2022-06-13 DIAGNOSIS — Z299 Encounter for prophylactic measures, unspecified: Secondary | ICD-10-CM | POA: Diagnosis not present

## 2022-06-13 DIAGNOSIS — N1831 Chronic kidney disease, stage 3a: Secondary | ICD-10-CM | POA: Diagnosis not present

## 2022-06-21 DIAGNOSIS — N183 Chronic kidney disease, stage 3 unspecified: Secondary | ICD-10-CM | POA: Diagnosis not present

## 2022-06-21 DIAGNOSIS — I10 Essential (primary) hypertension: Secondary | ICD-10-CM | POA: Diagnosis not present

## 2022-06-21 DIAGNOSIS — Z299 Encounter for prophylactic measures, unspecified: Secondary | ICD-10-CM | POA: Diagnosis not present

## 2022-06-21 DIAGNOSIS — M859 Disorder of bone density and structure, unspecified: Secondary | ICD-10-CM | POA: Diagnosis not present

## 2022-06-21 DIAGNOSIS — Z79899 Other long term (current) drug therapy: Secondary | ICD-10-CM | POA: Diagnosis not present

## 2022-06-21 DIAGNOSIS — E2839 Other primary ovarian failure: Secondary | ICD-10-CM | POA: Diagnosis not present

## 2022-06-21 DIAGNOSIS — Z789 Other specified health status: Secondary | ICD-10-CM | POA: Diagnosis not present

## 2022-08-01 ENCOUNTER — Ambulatory Visit (HOSPITAL_COMMUNITY)
Admission: RE | Admit: 2022-08-01 | Discharge: 2022-08-01 | Disposition: A | Discharge status: Home / Self Care | Payer: Medicare HMO | Source: Ambulatory Visit | Attending: Nurse Practitioner | Admitting: Nurse Practitioner

## 2022-08-01 DIAGNOSIS — Z1231 Encounter for screening mammogram for malignant neoplasm of breast: Secondary | ICD-10-CM | POA: Diagnosis not present

## 2022-08-30 NOTE — Progress Notes (Signed)
[facility] Note  09/13/2022     CHIEF COMPLAINT Patient presents for Retina Follow Up  HISTORY OF PRESENT ILLNESS: Darlene Crawford is a 82 y.o. 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  This started years ago.  Duration of 16 weeks.  Since onset it is stable.  I, the attending physician,  performed the HPI with the patient and updated documentation appropriately.        Comments   Patient feels that the cataracts are making the vision fuzzy. She is using AT's OU PRN. She is having a cataract eval in March with Dr. Herbert Deaner.       Last edited by Bernarda Caffey, MD on 09/13/2022 10:39 PM.     Pt feels like her cataract is getting worse-has cataract eval in March w/ Dr. Herbert Deaner scheduled.   Referring physician: Beren[address] [place],  [place] [address]  HISTORICAL INFORMATION:  Selected notes from the MEDICAL RECORD NUMBER Referred by Dr. Leander Rams for concern of ARMD OD with progression to CNVM vs CSR   CURRENT MEDICATIONS: Current Outpatient Medications (Ophthalmic Drugs)  Medication Sig   ARTIFICIAL TEAR SOLUTION OP Place 1 drop into both eyes daily as needed (dry eyes).   Current Facility-Administered Medications (Ophthalmic Drugs)  Medication Route   aflibercept (EYLEA) SOLN 2 mg Intravitreal   aflibercept (EYLEA) SOLN 2 mg Intravitreal   aflibercept (EYLEA) SOLN 2 mg Intravitreal   Current Outpatient Medications (Other)  Medication Sig   acidophilus (RISAQUAD) CAPS capsule Take 1 capsule by mouth daily.   atorvastatin (LIPITOR) 10 MG tablet Take 10 mg by mouth at bedtime.    Cholecalciferol (VITAMIN D) 50 MCG (2000 UT) tablet Take 4,000 Units by mouth at bedtime.    Cyanocobalamin (B-12 PO) Take 1 Dose by mouth 2 (two) times a week.   fluticasone (FLONASE) 50 MCG/ACT nasal spray Place 1 spray into both nostrils daily as needed for allergies or  rhinitis.   hydrochlorothiazide (HYDRODIURIL) 25 MG tablet Take 25 mg by mouth daily. 1/2 tablet am, 1/2 tablet pm   levothyroxine (SYNTHROID, LEVOTHROID) 100 MCG tablet Take 100 mcg by mouth daily before breakfast.   loratadine (CLARITIN) 10 MG tablet Take 10 mg by mouth daily as needed for allergies.   losartan (COZAAR) 100 MG tablet Take 100 mg by mouth daily.    Melatonin 10 MG CAPS Take 10 mg by mouth at bedtime.   Multiple Vitamins-Minerals (PRESERVISION AREDS 2 PO) Take 1 tablet by mouth at bedtime.    sertraline (ZOLOFT) 50 MG tablet Take 50 mg by mouth daily.   sodium chloride (OCEAN) 0.65 % SOLN nasal spray Place 1 spray into both nostrils as needed for congestion.   Current Facility-Administered Medications (Other)  Medication Route   Bevacizumab (AVASTIN) SOLN 1.25 mg Intravitreal   Bevacizumab (AVASTIN) SOLN 1.25 mg Intravitreal   Bevacizumab (AVASTIN) SOLN 1.25 mg Intravitreal   REVIEW OF SYSTEMS: ROS   Positive for: Genitourinary, Musculoskeletal, Endocrine, Eyes, Psychiatric, Allergic/Imm Negative for: Constitutional, Gastrointestinal, Neurological, Skin, HENT, Cardiovascular, Respiratory, Heme/Lymph Last edited by Annie Paras, COT on 09/13/2022  1:44 PM.     ALLERGIES Allergies  Allergen Reactions   Trilyte [Peg 3350-Kcl-Na Bicarb-Nacl]     HEMATEMESIS   Vicodin [Hydrocodone-Acetaminophen] Nausea And Vomiting   PAST MEDICAL HISTORY Past Medical History:  Diagnosis Date   Anxiety and depression    Cataract    Combined forms OU   Hypercholesterolemia    Hypertension    Hypertensive retinopathy    OU   Hypothyroidism    Macular degeneration    OD   Osteoarthritis    Seasonal allergies    Past Surgical History:  Procedure Laterality Date   BIOPSY  10/01/2018   Procedure: BIOPSY;  Surgeon: Danie Binder, MD;  [facility];  Service: Endoscopy;;  gastric bx & gastric polyp   COLONOSCOPY N/A 10/01/2018   Procedure: COLONOSCOPY;  Surgeon:  Danie Binder, MD;  Location: [facility];  Service: Endoscopy;  Laterality: N/A;  10:30am   ESOPHAGOGASTRODUODENOSCOPY N/A 10/01/2018   Procedure: ESOPHAGOGASTRODUODENOSCOPY (EGD);  Surgeon: Danie Binder, MD;  [facility]: [facility];  Service: Endoscopy;  Laterality: N/A;   POLYPECTOMY  10/01/2018   Procedure: POLYPECTOMY;  Surgeon: Danie Binder, MD;  [facility]: [facility];  Service: Endoscopy;;  colon    TUBAL LIGATION     UTERINE FIBROID SURGERY     FAMILY HISTORY Family History  Problem Relation Age of Onset   Cancer Mother        lung   Stroke Father    Stroke Brother    Colon cancer Neg Hx    SOCIAL HISTORY Social History   Tobacco Use   Smoking status: Former    Types: Cigarettes   Smokeless tobacco: Never  Vaping Use   Vaping Use: Never used  Substance Use Topics   Alcohol use: No   Drug use: No       OPHTHALMIC EXAM:  Base Eye Exam     Visual Acuity (Snellen - Linear)       Right Left   Dist cc 20/80 20/25   Dist ph cc 20/40     Correction: Glasses         Tonometry (Tonopen, 1:47 PM)       Right Left   Pressure 12 15         Pupils       Dark Light Shape React APD   Right 3 2 Round Brisk None   Left 3 2 Round Brisk None         Visual Fields       Left Right    Full Full         Extraocular Movement       Right Left    Full, Ortho Full, Ortho         Neuro/Psych     Oriented x3: Yes   Mood/Affect: Normal         Dilation     Right eye: 1.0% Mydriacyl, 2.5% Phenylephrine @ 1:44 PM           Slit Lamp and Fundus Exam     Slit Lamp Exam       Right Left   Lids/Lashes Dermatochalasis - upper lid Dermatochalasis - upper lid   Conjunctiva/Sclera White and quiet White and quiet   Cornea Arcus, early nasal band K, trace PEE Arcus, early nasal band K, 1+PEE   Anterior Chamber moderate depth, with narrow angle  moderate depth, with narrow angle temporally   Iris Round and well dilated Round and well  dilated   Lens 3+ Nuclear sclerosis with brunescence, 2-3+ Cortical cataract 2-3+ Nuclear sclerosis, 2-3+ Cortical cataract   Anterior Vitreous Vitreous syneresis, Posterior vitreous detachment, Weiss ring Vitreous syneresis  Fundus Exam       Right Left   Disc Pink and Sharp, Compact Pink and Sharp, Tilted disc   C/D Ratio 0.2 0.3   Macula Flat, good foveal reflex; central SRF - stably resolved; +drusen; +RPE mottling and clumping, small cluster of drusen and RPE mottling inferior fovea, no heme or edema Flat, good foveal reflex, Retinal pigment epithelial mottling, superior para-foveal focal area of RPE atrophy -- stable, rare drusen, No heme or edema   Vessels attenuated, Tortuous, mild AV crossing changes Vascular attenuation, Tortuous   Periphery Attached, peripheral drusen / RPE changes nasally, peripheral cystoid degeneration Attached, very mild lattice at 0430, mild patch of Lattice degeneration at 0600 -- stable, scattered peripheral drusen, peripheral cystoid degeneration           Refraction     Wearing Rx       Sphere Cylinder Axis Add   Right +2.00 +2.00 160 +2.50   Left +2.50 +2.00 020 +2.50           IMAGING AND PROCEDURES  Imaging and Procedures for 12/11/17  OCT, Retina - OU - Both Eyes       Right Eye Quality was good. Central Foveal Thickness: 254. Progression has been stable. Findings include normal foveal contour, no IRF, no SRF, retinal drusen , outer retinal atrophy (Stable improvement in SRF).   Left Eye Quality was good. Central Foveal Thickness: 272. Progression has been stable. Findings include normal foveal contour, no IRF, no SRF, retinal drusen .   Notes *Images captured and stored on drive  Diagnosis / Impression:  OD: NFP; No IRF; Stable improvement in SRF OS: NFP, No IRF/SRF  Clinical management:  See below  Abbreviations: NFP - Normal foveal profile. CME - cystoid macular edema. PED - pigment epithelial detachment. IRF -  intraretinal fluid. SRF - subretinal fluid. EZ - ellipsoid zone. ERM - epiretinal membrane. ORA - outer retinal atrophy. ORT - outer retinal tubulation. SRHM - subretinal hyper-reflective material       Intravitreal Injection, Pharmacologic Agent - OD - Right Eye       Time Out 09/13/2022. 2:42 PM. Confirmed correct patient, procedure, site, and patient consented.   Anesthesia Topical anesthesia was used. Anesthetic medications included Lidocaine 2%, Proparacaine 0.5%.   Procedure Preparation included 5% betadine to ocular surface, eyelid speculum. A (32g) needle was used.   Injection: 2 mg aflibercept 2 MG/0.05ML   Route: Intravitreal, Site: Right Eye   NDC: [id number], Lot: [id number], Expiration date: 11/26/2022, Waste: 0 mL   Post-op Post injection exam found visual acuity of at least counting fingers. The patient tolerated the procedure well. There were no complications. The patient received written and verbal post procedure care education. Post injection medications were not given.            ASSESSMENT/PLAN:    ICD-10-CM   1. Exudative age-related macular degeneration of right eye with active choroidal neovascularization (HCC)  H35.3211 OCT, Retina - OU - Both Eyes    Intravitreal Injection, Pharmacologic Agent - OD - Right Eye    aflibercept (EYLEA) SOLN 2 mg    2. 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 Essential hypertension  I10     4. Hypertensive retinopathy of both eyes  H35.033     5. 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 Posterior vitreous detachment of right eye  H43.811     7. Combined forms of age-related  cataract of both eyes  H25.813       1,2. Exudative age related macular degeneration vs CSCR OD  - FA (03.02.19) without leakage / pooling into area of SRF  - review of systems reveals significant stressors in life, but no exogenous steroid use  - S/P IVA #1 OD (09.27.19),  #2 (10.25.19), #3 (11.22.19) --  minimal response -- IVA resistance  - pt approved for [employer] and IVE  - S/P IVE OD #1 (12.20.19), #2 (01.20.20), #3 (02.18.20), #4 (07.20.20) #5 (09.01.20), #6 (10.13.20), #7 (11.20.20), #8 (01.20.21), #9 (02.17.21), #10 (03.19.21), #11 (04.20.21), #12 (05.25.21), #13 (6.23.21), #14 (07.27.21), #15 (08.30.21), #16 (9.28.21), #17 (10.26.21), #18 (11.29.21), #19 (01.11.22), #20 (02.08.22), #21 (3.8.22), #22 (04.05.22), #23 (5.10.22), #24 (06.21.22), #25 (8.2.22), #26 (9.20.22), #27 (11.15.22), #28 (01.17.23), #29 (03.29.23), #30 (06.21.23), #31 (09.27.23)  - lost to f/u from Feb 2020 to July 2020 due to COVID-19 concerns  - today, VA stable at 20/40 OD   - OCT today shows stable resolution of SRF at 16 weeks  - review of OCTs shows last time she had SRF was Jan 2022  - recommend IVE OD #32 today, 01.16.24 -- maintenance w/ f/u at 16 wks  - pt wishes to proceed with IVE OD  - RBA of procedure discussed, questions answered  - informed consent obtained   - Eylea informed consent form signed and scanned on 01.17.23  - see procedure note  - [email or web address] benefits investigation initiated -- approved for 2022  - f/u 16 weeks -- DFE/OCT/ possible injection  - will likely stay on a q16 wk maintenance schedule  3,4. Hypertensive retinopathy OU  - discussed importance of tight BP control  - stable  - monitor  5. Lattice degeneration OS-  - patch of inferior lattice OS -- no RT, holes or SRF  - asymptomatic   - discussed findings, prognosis, and treatment options including observation  - monitor  6. PVD / vitreous syneresis OD-   - Discussed findings and prognosis  - No RT or RD on 360 peripheral exam  - Reviewed s/s of RT/RD  - Strict return precautions for any such RT/RD signs/symptoms  7. Combined form age-related cataract OU-   - The symptoms of cataract, surgical options, and treatments and risks were discussed with patient.  - discussed diagnosis and progression  - will refer to [facility] for cataract evaluation and management  Ophthalmic Meds Ordered this visit:  Meds ordered this encounter  Medications   aflibercept (EYLEA) SOLN 2 mg     Return in about 16 weeks (around 01/03/2023) for exu ARMD OD; DFE, OCT, possible injection .  There are no Patient Instructions on file for this visit.   This document serves as a record of services personally performed by Gardiner Sleeper, MD, PhD. It was created on their behalf by Renaldo Reel, [place] an ophthalmic technician. The creation of this record is the provider's dictation and/or activities during the visit.    Electronically signed by:  Renaldo Reel, COT  01.02.24 10:39 PM  This document serves as a record of services personally performed by Gardiner Sleeper, MD, PhD. It was created on their behalf by Orvan Falconer, an ophthalmic technician. The creation of this record is the provider's dictation and/or activities during the visit.    Electronically signed by: Orvan Falconer, OA, 09/13/22  10:39 PM   Gardiner Sleeper, M.D., Ph.D. Diseases & Surgery of the Retina and Vitreous [facility]  I have reviewed the above documentation for accuracy and completeness, and I agree with the above. Gardiner Sleeper, M.D., Ph.D. 09/13/22 10:44 PM   Abbreviations: M myopia (nearsighted); A astigmatism; H hyperopia (farsighted); P presbyopia; Mrx spectacle prescription;  CTL contact lenses; OD right eye; OS left eye; OU both eyes  XT exotropia; ET esotropia; PEK punctate epithelial keratitis; PEE punctate epithelial erosions; DES dry eye syndrome; MGD meibomian gland dysfunction; ATs artificial tears; PFAT's preservative free artificial tears; [place] nuclear sclerotic cataract; PSC posterior subcapsular cataract; ERM epi-retinal membrane; PVD posterior vitreous detachment; RD retinal detachment; DM diabetes mellitus; DR diabetic retinopathy; NPDR non-proliferative diabetic retinopathy; PDR proliferative  diabetic retinopathy; CSME clinically significant macular edema; DME diabetic macular edema; dbh dot blot hemorrhages; CWS cotton wool spot; POAG primary open angle glaucoma; C/D cup-to-disc ratio; HVF humphrey visual field; GVF goldmann visual field; OCT optical coherence tomography; IOP intraocular pressure; BRVO Branch retinal vein occlusion; CRVO central retinal vein occlusion; CRAO central retinal artery occlusion; BRAO branch retinal artery occlusion; RT retinal tear; SB scleral buckle; PPV pars plana vitrectomy; VH Vitreous hemorrhage; PRP panretinal laser photocoagulation; IVK intravitreal kenalog; VMT vitreomacular traction; MH Macular hole;  NVD neovascularization of the disc; NVE neovascularization elsewhere; AREDS age related eye disease study; ARMD age related macular degeneration; POAG primary open angle glaucoma; EBMD epithelial/anterior basement membrane dystrophy; ACIOL anterior chamber intraocular lens; IOL intraocular lens; PCIOL posterior chamber intraocular lens; Phaco/IOL phacoemulsification with intraocular lens placement; [place] photorefractive keratectomy; LASIK laser assisted in situ keratomileusis; HTN hypertension; DM diabetes mellitus; COPD chronic obstructive pulmonary disease

## 2022-09-13 ENCOUNTER — Ambulatory Visit (INDEPENDENT_AMBULATORY_CARE_PROVIDER_SITE_OTHER): Payer: Medicare HMO | Admitting: Ophthalmology

## 2022-09-13 ENCOUNTER — Encounter (INDEPENDENT_AMBULATORY_CARE_PROVIDER_SITE_OTHER): Payer: Self-pay | Admitting: Ophthalmology

## 2022-09-13 DIAGNOSIS — H35412 Lattice degeneration of retina, left eye: Secondary | ICD-10-CM

## 2022-09-13 DIAGNOSIS — H43811 Vitreous degeneration, right eye: Secondary | ICD-10-CM

## 2022-09-13 DIAGNOSIS — H35711 Central serous chorioretinopathy, right eye: Secondary | ICD-10-CM

## 2022-09-13 DIAGNOSIS — H353211 Exudative age-related macular degeneration, right eye, with active choroidal neovascularization: Secondary | ICD-10-CM

## 2022-09-13 DIAGNOSIS — I10 Essential (primary) hypertension: Secondary | ICD-10-CM | POA: Diagnosis not present

## 2022-09-13 DIAGNOSIS — H25813 Combined forms of age-related cataract, bilateral: Secondary | ICD-10-CM

## 2022-09-13 DIAGNOSIS — H35033 Hypertensive retinopathy, bilateral: Secondary | ICD-10-CM | POA: Diagnosis not present

## 2022-09-13 MED ORDER — AFLIBERCEPT 2MG/0.05ML IZ SOLN FOR KALEIDOSCOPE
2.0000 mg | INTRAVITREAL | Status: AC | PRN
  Administered 2022-09-13: 2 mg via INTRAVITREAL

## 2022-09-14 DIAGNOSIS — N1831 Chronic kidney disease, stage 3a: Secondary | ICD-10-CM | POA: Diagnosis not present

## 2022-09-14 DIAGNOSIS — Z6835 Body mass index (BMI) 35.0-35.9, adult: Secondary | ICD-10-CM | POA: Diagnosis not present

## 2022-09-14 DIAGNOSIS — Z299 Encounter for prophylactic measures, unspecified: Secondary | ICD-10-CM | POA: Diagnosis not present

## 2022-09-14 DIAGNOSIS — I10 Essential (primary) hypertension: Secondary | ICD-10-CM | POA: Diagnosis not present

## 2022-09-14 DIAGNOSIS — R350 Frequency of micturition: Secondary | ICD-10-CM | POA: Diagnosis not present

## 2022-09-14 DIAGNOSIS — N390 Urinary tract infection, site not specified: Secondary | ICD-10-CM | POA: Diagnosis not present

## 2022-10-18 DIAGNOSIS — H2513 Age-related nuclear cataract, bilateral: Secondary | ICD-10-CM | POA: Diagnosis not present

## 2022-10-18 DIAGNOSIS — H3530 Unspecified macular degeneration: Secondary | ICD-10-CM | POA: Diagnosis not present

## 2022-11-08 NOTE — H&P (Signed)
Surgical History & Physical  Patient Name: Darlene Crawford  DOB: 1941-03-27  Surgery: Cataract extraction with intraocular lens implant phacoemulsification; Right Eye Surgeon: Ronn Melena MD Surgery Date: 11/18/2022 Pre-Op Date: 10/18/2022  HPI: A 82 Yr. old female patient present for cataract eval per Dr. [name]. 1. 1. The patient complains of difficulty when viewing TV, reading closed caption, news scrolls on TV, which began 1 year ago. The right eye is affected. The episode is constant. The condition's severity is worsening. This is negatively affecting the patient's quality of life and the patient is unable to function adequately in life with the current level of vision.  Medical History: Macula Degeneration Cataracts CSR OD, Hypertensive Retinopathy OU, lattice degeneration OU Arthritis High Blood Pressure LDL Thyroid Problems anxiety, depression  Review of Systems Negative Allergic/Immunologic Negative Constitutional Negative Ear, Nose, Mouth & Throat Negative Endocrine Negative Eyes Negative Gastrointestinal Negative Genitourinary Negative Hematologic/Lymphatic Negative Integumentary Negative Musculoskeletal Negative Neurological Negative Respiratory Cardiovascular High Blood Pressure Psychiatry Anxiety, Depression All recorded systems are negative except as noted above  Social Former smoker of Cigarettes   Medication Acidophllus, Atorvastatin, Fluticasone, Hydrochlorothiazide, Levothyroxine, Loratadine, Losartan, Melatonin, Sertraline  Sx/Procedures Intraocular Injectons OD- Eylea,  Tubal Ligation, Polypectomy, Uterine fibroid sx   Drug Allergies  Trilyte, Vicodin   History & Physical: Heent: cataracts NECK: supple without bruits LUNGS: lungs clear to auscultation CV: regular rate and rhythm Abdomen: soft and non-tender  Impression & Plan: Assessment: 1.  CATARACT AGE-RELATED NUCLEAR; Both Eyes (H25.13) 2.  Hyperopia ; Both Eyes (H52.03) 3.   MACULAR DEGENERATION UNSPECIFIED (H35.30)  Plan: 1.  Cataracts are visually significant and account for the patient's complaints. Discussed all risks, benefits, procedures and recovery, including infection, loss of vision and eye, need for glasses after surgery or additional procedures. Patient understands changing glasses will not improve vision. Patient indicated understanding of procedure. All questions answered. Patient desires to have surgery, recommend phacoemulsification with intraocular lens. Patient to have preliminary testing necessary ([place]/IOL Master, Mac OCT, TOPO) Educational materials provided.  Plan: - Proceed with cataract surgery OD followed by OS - target best distance, RayOne lens - Hx of wet AMD and injections OD - no DM, no fuchs, no prior eye surgeries, ok with lying flat  2.  Continue current for now  3.  Wet OD, dry OS. Has had Eylea for past 3 years OD, now on treat and extend and seeing Dr. [name] once every 4 months (next in May 2024).

## 2022-11-10 ENCOUNTER — Encounter (HOSPITAL_COMMUNITY): Payer: Self-pay

## 2022-11-10 ENCOUNTER — Other Ambulatory Visit: Payer: Self-pay

## 2022-11-11 ENCOUNTER — Encounter (HOSPITAL_COMMUNITY)
Admission: RE | Admit: 2022-11-11 | Discharge: 2022-11-11 | Disposition: A | Discharge status: Home / Self Care | Payer: Medicare HMO | Source: Ambulatory Visit | Attending: Optometry | Admitting: Optometry

## 2022-11-11 HISTORY — DX: Other specified postprocedural states: Z98.890

## 2022-11-11 HISTORY — DX: Other specified postprocedural states: R11.2

## 2022-12-16 ENCOUNTER — Encounter (HOSPITAL_COMMUNITY)
Admission: RE | Admit: 2022-12-16 | Discharge: 2022-12-16 | Disposition: A | Discharge status: Home / Self Care | Payer: Medicare HMO | Source: Ambulatory Visit | Attending: Optometry | Admitting: Optometry

## 2022-12-16 DIAGNOSIS — H2511 Age-related nuclear cataract, right eye: Secondary | ICD-10-CM | POA: Diagnosis not present

## 2022-12-20 DIAGNOSIS — Z0000 Encounter for general adult medical examination without abnormal findings: Secondary | ICD-10-CM | POA: Diagnosis not present

## 2022-12-20 DIAGNOSIS — E039 Hypothyroidism, unspecified: Secondary | ICD-10-CM | POA: Diagnosis not present

## 2022-12-20 DIAGNOSIS — Z299 Encounter for prophylactic measures, unspecified: Secondary | ICD-10-CM | POA: Diagnosis not present

## 2022-12-20 DIAGNOSIS — Z7189 Other specified counseling: Secondary | ICD-10-CM | POA: Diagnosis not present

## 2022-12-20 DIAGNOSIS — N183 Chronic kidney disease, stage 3 unspecified: Secondary | ICD-10-CM | POA: Diagnosis not present

## 2022-12-20 DIAGNOSIS — I10 Essential (primary) hypertension: Secondary | ICD-10-CM | POA: Diagnosis not present

## 2022-12-20 DIAGNOSIS — Z1339 Encounter for screening examination for other mental health and behavioral disorders: Secondary | ICD-10-CM | POA: Diagnosis not present

## 2022-12-20 DIAGNOSIS — Z1331 Encounter for screening for depression: Secondary | ICD-10-CM | POA: Diagnosis not present

## 2022-12-20 NOTE — Progress Notes (Signed)
[facility] - Clinic Note  01/03/2023     CHIEF COMPLAINT Patient presents for Retina Follow Up  HISTORY OF PRESENT ILLNESS: Darlene Crawford is a 82 y.o. 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  Severity is moderate.  Duration of 16 weeks.  Since onset it is gradually improving.  I, the attending physician,  performed the HPI with the patient and updated documentation appropriately.        Comments   16 week Retina eval for Exu Armd od. Patient states she had Cataract Surgery 11 days ago by Dr.Snyder. Patient only wanted OD dilated today.      Last edited by Rennis Chris, MD on 01/03/2023  4:47 PM.    Pt had cataract sx OD with Dr. Ilsa Iha about 11 days ago, she states about a week before the sx she thought the fluid in her right eye was coming back because she started seeing a spot in her vision, she is having left eye cataract sx towards to end of June  Referring physician: Wendall Papa [address] [place],  [place] [address]  HISTORICAL INFORMATION:  Selected notes from the MEDICAL RECORD NUMBER Referred by Dr. Monico Blitz for concern of ARMD OD with progression to CNVM vs CSR   CURRENT MEDICATIONS: Current Outpatient Medications (Ophthalmic Drugs)  Medication Sig   ARTIFICIAL TEAR SOLUTION OP Place 1 drop into both eyes daily as needed (dry eyes).   Current Facility-Administered Medications (Ophthalmic Drugs)  Medication Route   aflibercept (EYLEA) SOLN 2 mg Intravitreal   aflibercept (EYLEA) SOLN 2 mg Intravitreal   aflibercept (EYLEA) SOLN 2 mg Intravitreal   Current Outpatient Medications (Other)  Medication Sig   acidophilus (RISAQUAD) CAPS capsule Take 1 capsule by mouth daily.   atorvastatin (LIPITOR) 10 MG tablet Take 10 mg by mouth at bedtime.    Cholecalciferol (VITAMIN D) 50 MCG (2000 UT) tablet Take 4,000 Units by mouth at bedtime.    fluticasone  (FLONASE) 50 MCG/ACT nasal spray Place 1 spray into both nostrils daily as needed for allergies or rhinitis.   levothyroxine (SYNTHROID, LEVOTHROID) 100 MCG tablet Take 100 mcg by mouth daily before breakfast.   loratadine (CLARITIN) 10 MG tablet Take 10 mg by mouth daily as needed for allergies.   losartan (COZAAR) 100 MG tablet Take 100 mg by mouth daily.    Melatonin 10 MG CAPS Take 10 mg by mouth at bedtime.   Multiple Vitamins-Minerals (PRESERVISION AREDS 2 PO) Take 1 tablet by mouth at bedtime.    sodium chloride (OCEAN) 0.65 % SOLN nasal spray Place 1 spray into both nostrils as needed for congestion.   Cyanocobalamin (B-12 PO) Take 1 Dose by mouth 2 (two) times a week.   hydrochlorothiazide (HYDRODIURIL) 25 MG tablet Take 25 mg by mouth daily. 1/2 tablet am, 1/2 tablet pm   sertraline (ZOLOFT) 50 MG tablet Take 50 mg by mouth daily.   Current Facility-Administered Medications (Other)  Medication Route   Bevacizumab (AVASTIN) SOLN 1.25 mg Intravitreal   Bevacizumab (AVASTIN) SOLN 1.25 mg Intravitreal   Bevacizumab (AVASTIN) SOLN 1.25 mg Intravitreal   REVIEW OF SYSTEMS: ROS   Positive for: Genitourinary, Musculoskeletal, Endocrine, Eyes, Psychiatric, Allergic/Imm Negative for: Constitutional, Gastrointestinal, Neurological, Skin, HENT, Cardiovascular, Respiratory, Heme/Lymph Last edited by Lana Fish, COT on 01/03/2023  1:28 PM.  ALLERGIES Allergies  Allergen Reactions   Trilyte [Peg 3350-Kcl-Na Bicarb-Nacl]     HEMATEMESIS   Vicodin [Hydrocodone-Acetaminophen] Nausea And Vomiting   PAST MEDICAL HISTORY Past Medical History:  Diagnosis Date   Anxiety and depression    Cataract    Hypercholesterolemia    Hypertension    Hypertensive retinopathy    OU   Hypothyroidism    Macular degeneration    OD   Osteoarthritis    PONV (postoperative nausea and vomiting)    Seasonal allergies    Past Surgical History:  Procedure Laterality Date   BIOPSY  10/01/2018    Procedure: BIOPSY;  Surgeon: West Bali, MD;  [facility]: [facility];  Service: Endoscopy;;  gastric bx & gastric polyp   CATARACT EXTRACTION W/PHACO Right 12/23/2022   Procedure: CATARACT EXTRACTION PHACO AND INTRAOCULAR LENS PLACEMENT (IOC);  Surgeon: Pecolia Ades, MD;  Location: [facility];  Service: Ophthalmology;  Laterality: Right;  CDE: 11.88   COLONOSCOPY N/A 10/01/2018   Procedure: COLONOSCOPY;  Surgeon: West Bali, MD;  Location: [facility];  Service: Endoscopy;  Laterality: N/A;  10:30am   ESOPHAGOGASTRODUODENOSCOPY N/A 10/01/2018   Procedure: ESOPHAGOGASTRODUODENOSCOPY (EGD);  Surgeon: West Bali, MD;  [facility]: [facility];  Service: Endoscopy;  Laterality: N/A;   POLYPECTOMY  10/01/2018   Procedure: POLYPECTOMY;  Surgeon: West Bali, MD;  Location: [facility];  Service: Endoscopy;;  colon    TUBAL LIGATION     UTERINE FIBROID SURGERY     FAMILY HISTORY Family History  Problem Relation Age of Onset   Cancer Mother        lung   Stroke Father    Stroke Brother    Colon cancer Neg Hx    SOCIAL HISTORY Social History   Tobacco Use   Smoking status: Former    Packs/day: 0.25    Years: 4.00    Additional pack years: 0.00    Total pack years: 1.00    Types: Cigarettes    Quit date: 11/04/1964    Years since quitting: 58.2   Smokeless tobacco: Never  Vaping Use   Vaping Use: Never used  Substance Use Topics   Alcohol use: No   Drug use: No       OPHTHALMIC EXAM:  Base Eye Exam     Visual Acuity (Snellen - Linear)       Right Left   Dist [place] 20/25-1    Dist cc  20/25-1   Dist ph [place] 20/20-3    Dist ph cc  20/NI         Tonometry (Tonopen, 1:32 PM)       Right Left   Pressure 11 11         Pupils       Dark Light Shape React APD   Right 3 2 Round Brisk None   Left 3 2 Round Brisk None         Visual Fields (Counting fingers)       Left Right    Full Full         Extraocular Movement       Right Left    Full,  Ortho Full, Ortho         Neuro/Psych     Oriented x3: Yes   Mood/Affect: Normal         Dilation     Right eye: 1.0% Mydriacyl, 2.5% Phenylephrine @ 1:32 PM  Slit Lamp and Fundus Exam     Slit Lamp Exam       Right Left   Lids/Lashes Dermatochalasis - upper lid Dermatochalasis - upper lid   Conjunctiva/Sclera White and quiet White and quiet   Cornea Arcus, well healed cataract wound Arcus, early nasal band K, 1+PEE   Anterior Chamber Deep, 1-2+ fine cell / pigment moderate depth, with narrow angle temporally   Iris Round and well dilated Round and well dilated   Lens PC IOL in good position 2-3+ Nuclear sclerosis, 2-3+ Cortical cataract   Anterior Vitreous Vitreous syneresis, Posterior vitreous detachment, Weiss ring Vitreous syneresis         Fundus Exam       Right Left   Disc Pink and Sharp, Compact Pink and Sharp, Tilted disc   C/D Ratio 0.2 0.3   Macula Flat, good foveal reflex; central SRF - re-current; +drusen; +RPE mottling and clumping, small cluster of drusen and RPE mottling inferior fovea, no heme Flat, good foveal reflex, Retinal pigment epithelial mottling, superior para-foveal focal area of RPE atrophy -- stable, rare drusen, No heme or edema   Vessels attenuated, Tortuous, mild AV crossing changes Vascular attenuation, Tortuous   Periphery Attached, peripheral drusen / RPE changes nasally, peripheral cystoid degeneration Attached, very mild lattice at 0430, mild patch of Lattice degeneration at 0600 -- stable, scattered peripheral drusen, peripheral cystoid degeneration           Refraction     Wearing Rx       Sphere Cylinder Axis Add   Right +2.00 +2.00 160 +2.50   Left +2.50 +2.00 020 +2.50           IMAGING AND PROCEDURES  Imaging and Procedures for 12/11/17  OCT, Retina - OU - Both Eyes       Right Eye Quality was good. Central Foveal Thickness: 395. Progression has worsened. Findings include normal foveal contour,  no IRF, retinal drusen , pigment epithelial detachment, subretinal fluid, outer retinal atrophy (Interval re-development of central SRF).   Left Eye Quality was good. Central Foveal Thickness: 272. Progression has been stable. Findings include normal foveal contour, no IRF, no SRF, retinal drusen .   Notes *Images captured and stored on drive  Diagnosis / Impression:  OD: NFP; No IRF; Interval re-development of central SRF OS: NFP, No IRF/SRF  Clinical management:  See below  Abbreviations: NFP - Normal foveal profile. CME - cystoid macular edema. PED - pigment epithelial detachment. IRF - intraretinal fluid. SRF - subretinal fluid. EZ - ellipsoid zone. ERM - epiretinal membrane. ORA - outer retinal atrophy. ORT - outer retinal tubulation. SRHM - subretinal hyper-reflective material       Intravitreal Injection, Pharmacologic Agent - OD - Right Eye       Time Out 01/03/2023. 2:22 PM. Confirmed correct patient, procedure, site, and patient consented.   Anesthesia Topical anesthesia was used. Anesthetic medications included Lidocaine 2%, Proparacaine 0.5%.   Procedure Preparation included 5% betadine to ocular surface, eyelid speculum. A (32g) needle was used.   Injection: 2 mg aflibercept 2 MG/0.05ML   Route: Intravitreal, Site: Right Eye   NDC: [id number], Lot: [id number], Expiration date: 12/25/2023, Waste: 0 mL   Post-op Post injection exam found visual acuity of at least counting fingers. The patient tolerated the procedure well. There were no complications. The patient received written and verbal post procedure care education. Post injection medications were not given.  ASSESSMENT/PLAN:    ICD-10-CM   1. Exudative age-related macular degeneration of right eye with active choroidal neovascularization (HCC)  H35.3211 OCT, Retina - OU - Both Eyes    Intravitreal Injection, Pharmacologic Agent - OD - Right Eye    aflibercept (EYLEA) SOLN 2 mg    2.  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 Essential hypertension  I10     4. Hypertensive retinopathy of both eyes  H35.033     5. 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 Posterior vitreous detachment of right eye  H43.811     7. Combined forms of age-related cataract of left eye  H25.812     8. Pseudophakia  Z96.1      1,2. Exudative age related macular degeneration vs CSCR OD  - FA (03.02.19) without leakage / pooling into area of SRF  - review of systems reveals significant stressors in life, but no exogenous steroid use  - S/P IVA #1 OD (09.27.19),  #2 (10.25.19), #3 (11.22.19) -- minimal response -- IVA resistance  - pt approved for [employer] and IVE  - S/P IVE OD #1 (12.20.19), #2 (01.20.20), #3 (02.18.20), #4 (07.20.20) #5 (09.01.20), #6 (10.13.20), #7 (11.20.20), #8 (01.20.21), #9 (02.17.21), #10 (03.19.21), #11 (04.20.21), #12 (05.25.21), #13 (6.23.21), #14 (07.27.21), #15 (08.30.21), #16 (9.28.21), #17 (10.26.21), #18 (11.29.21), #19 (01.11.22), #20 (02.08.22), #21 (3.8.22), #22 (04.05.22), #23 (5.10.22), #24 (06.21.22), #25 (8.2.22), #26 (9.20.22), #27 (11.15.22), #28 (01.17.23), #29 (03.29.23), #30 (06.21.23), #31 (09.27.23), #32 (01.16.24)  - lost to f/u from Feb 2020 to July 2020 due to COVID-19 concerns  - today, [facility] 20/20 OD -- improved post cataract surgery despite recurrent SRF  - OCT today interval re-development of central SRF at 16 weeks  - review of OCTs shows last time she had SRF was Jan 2022  - recommend IVE OD #33 today, 05.07.24 -- with follow up back to 6 wks  - pt wishes to proceed with IVE OD  - RBA of procedure discussed, questions answered  - informed consent obtained   - Eylea informed consent form signed and scanned on 01.17.23  - see procedure note  - [email or web address] benefits investigation initiated -- approved for 2022  - f/u 6 weeks -- DFE/OCT/ possible injection  3,4. Hypertensive retinopathy OU  - discussed importance of  tight BP control  - stable  - monitor  5. Lattice degeneration OS-  - patch of inferior lattice OS -- no RT, holes or SRF  - asymptomatic   - discussed findings, prognosis, and treatment options including observation  - monitor  6. PVD / vitreous syneresis OD-   - Discussed findings and prognosis  - No RT or RD on 360 peripheral exam  - Reviewed s/s of RT/RD  - Strict return precautions for any such RT/RD signs/symptoms  7. Combined form age-related cataract OS  - The symptoms of cataract, surgical options, and treatments and risks were discussed with patient.  - discussed diagnosis and progression  - under the expert management of Dr. Ilsa Iha  8. Pseudophakia OD  - s/p CE/IOL OD (Dr. Ilsa Iha, 04.26.24)  - IOL in good position, doing well  - monitor   Ophthalmic Meds Ordered this visit:  Meds ordered this encounter  Medications   aflibercept (EYLEA) SOLN 2 mg     Return in about 6 weeks (around 02/14/2023) for f/u exu ARMD OD, DFE, OCT.  There are no Patient Instructions on file for  this visit.   This document serves as a record of services personally performed by Karie Chimera, MD, PhD. It was created on their behalf by Gerilyn Nestle, COT an ophthalmic technician. The creation of this record is the provider's dictation and/or activities during the visit.    Electronically signed by:  Gerilyn Nestle, COT  04.23.24 4:47 PM  This document serves as a record of services personally performed by Karie Chimera, MD, PhD. It was created on their behalf by Glee Arvin. Manson Passey, OA an ophthalmic technician. The creation of this record is the provider's dictation and/or activities during the visit.    Electronically signed by: Glee Arvin. Manson Passey, [place] 05.07.2024 4:47 PM  Karie Chimera, M.D., Ph.D. Diseases & Surgery of the Retina and Vitreous [facility]  I have reviewed the above documentation for accuracy and completeness, and I agree with the above.  Karie Chimera, M.D., Ph.D. 01/03/23 4:49 PM   Abbreviations: M myopia (nearsighted); A astigmatism; H hyperopia (farsighted); P presbyopia; Mrx spectacle prescription;  CTL contact lenses; OD right eye; OS left eye; OU both eyes  XT exotropia; ET esotropia; PEK punctate epithelial keratitis; PEE punctate epithelial erosions; DES dry eye syndrome; MGD meibomian gland dysfunction; ATs artificial tears; PFAT's preservative free artificial tears; NSC nuclear sclerotic cataract; PSC posterior subcapsular cataract; ERM epi-retinal membrane; PVD posterior vitreous detachment; RD retinal detachment; DM diabetes mellitus; DR diabetic retinopathy; NPDR non-proliferative diabetic retinopathy; PDR proliferative diabetic retinopathy; CSME clinically significant macular edema; DME diabetic macular edema; dbh dot blot hemorrhages; CWS cotton wool spot; POAG primary open angle glaucoma; C/D cup-to-disc ratio; HVF humphrey visual field; GVF goldmann visual field; OCT optical coherence tomography; IOP intraocular pressure; BRVO Branch retinal vein occlusion; CRVO central retinal vein occlusion; CRAO central retinal artery occlusion; BRAO branch retinal artery occlusion; RT retinal tear; SB scleral buckle; PPV pars plana vitrectomy; VH Vitreous hemorrhage; PRP panretinal laser photocoagulation; IVK intravitreal kenalog; VMT vitreomacular traction; MH Macular hole;  NVD neovascularization of the disc; NVE neovascularization elsewhere; AREDS age related eye disease study; ARMD age related macular degeneration; POAG primary open angle glaucoma; EBMD epithelial/anterior basement membrane dystrophy; ACIOL anterior chamber intraocular lens; IOL intraocular lens; PCIOL posterior chamber intraocular lens; Phaco/IOL phacoemulsification with intraocular lens placement; PRK photorefractive keratectomy; LASIK laser assisted in situ keratomileusis; HTN hypertension; DM diabetes mellitus; COPD chronic obstructive pulmonary disease

## 2022-12-23 ENCOUNTER — Ambulatory Visit (HOSPITAL_COMMUNITY): Payer: Medicare HMO | Admitting: Anesthesiology

## 2022-12-23 ENCOUNTER — Encounter (HOSPITAL_COMMUNITY): Payer: Self-pay | Admitting: Optometry

## 2022-12-23 ENCOUNTER — Encounter (HOSPITAL_COMMUNITY)
Admission: RE | Disposition: A | Discharge status: Home / Self Care | Payer: Self-pay | Source: Home / Self Care | Attending: Optometry

## 2022-12-23 ENCOUNTER — Ambulatory Visit (HOSPITAL_COMMUNITY)
Admission: RE | Admit: 2022-12-23 | Discharge: 2022-12-23 | Disposition: A | Discharge status: Home / Self Care | Payer: Medicare HMO | Attending: Optometry | Admitting: Optometry

## 2022-12-23 DIAGNOSIS — E039 Hypothyroidism, unspecified: Secondary | ICD-10-CM | POA: Diagnosis not present

## 2022-12-23 DIAGNOSIS — F418 Other specified anxiety disorders: Secondary | ICD-10-CM

## 2022-12-23 DIAGNOSIS — H5203 Hypermetropia, bilateral: Secondary | ICD-10-CM | POA: Diagnosis not present

## 2022-12-23 DIAGNOSIS — H3530 Unspecified macular degeneration: Secondary | ICD-10-CM | POA: Insufficient documentation

## 2022-12-23 DIAGNOSIS — Z87891 Personal history of nicotine dependence: Secondary | ICD-10-CM

## 2022-12-23 DIAGNOSIS — H2511 Age-related nuclear cataract, right eye: Secondary | ICD-10-CM | POA: Diagnosis not present

## 2022-12-23 DIAGNOSIS — I10 Essential (primary) hypertension: Secondary | ICD-10-CM

## 2022-12-23 HISTORY — PX: CATARACT EXTRACTION W/PHACO: SHX586

## 2022-12-23 SURGERY — PHACOEMULSIFICATION, CATARACT, WITH IOL INSERTION
Anesthesia: Monitor Anesthesia Care | Site: Eye | Laterality: Right

## 2022-12-23 MED ORDER — TETRACAINE HCL 0.5 % OP SOLN
1.0000 [drp] | OPHTHALMIC | Status: AC
  Administered 2022-12-23 (×3): 1 [drp] via OPHTHALMIC

## 2022-12-23 MED ORDER — LIDOCAINE HCL 3.5 % OP GEL
1.0000 | Freq: Once | OPHTHALMIC | Status: AC
  Administered 2022-12-23: 1 via OPHTHALMIC

## 2022-12-23 MED ORDER — POVIDONE-IODINE 5 % OP SOLN
OPHTHALMIC | Status: DC | PRN
  Administered 2022-12-23: 1 via OPHTHALMIC

## 2022-12-23 MED ORDER — MIDAZOLAM HCL 5 MG/5ML IJ SOLN
INTRAMUSCULAR | Status: DC | PRN
  Administered 2022-12-23: 2 mg via INTRAVENOUS

## 2022-12-23 MED ORDER — MIDAZOLAM HCL 2 MG/2ML IJ SOLN
INTRAMUSCULAR | Status: AC
  Filled 2022-12-23: qty 2

## 2022-12-23 MED ORDER — LIDOCAINE HCL (PF) 1 % IJ SOLN
INTRAMUSCULAR | Status: DC | PRN
  Administered 2022-12-23: 1 mL

## 2022-12-23 MED ORDER — PHENYLEPHRINE-KETOROLAC 1-0.3 % IO SOLN
INTRAOCULAR | Status: AC
  Filled 2022-12-23: qty 4

## 2022-12-23 MED ORDER — PHENYLEPHRINE HCL 2.5 % OP SOLN
1.0000 [drp] | OPHTHALMIC | Status: AC
  Administered 2022-12-23 (×3): 1 [drp] via OPHTHALMIC

## 2022-12-23 MED ORDER — PHENYLEPHRINE-KETOROLAC 1-0.3 % IO SOLN
INTRAOCULAR | Status: DC | PRN
  Administered 2022-12-23: 500 mL via OPHTHALMIC

## 2022-12-23 MED ORDER — NEOMYCIN-POLYMYXIN-DEXAMETH 3.5-10000-0.1 OP SUSP
OPHTHALMIC | Status: DC | PRN
  Administered 2022-12-23: 2 [drp] via OPHTHALMIC

## 2022-12-23 MED ORDER — TROPICAMIDE 1 % OP SOLN
1.0000 [drp] | OPHTHALMIC | Status: AC
  Administered 2022-12-23 (×3): 1 [drp] via OPHTHALMIC

## 2022-12-23 MED ORDER — STERILE WATER FOR IRRIGATION IR SOLN
Status: DC | PRN
  Administered 2022-12-23: 250 mL

## 2022-12-23 MED ORDER — BSS IO SOLN
INTRAOCULAR | Status: DC | PRN
  Administered 2022-12-23: 15 mL via INTRAOCULAR

## 2022-12-23 MED ORDER — SIGHTPATH DOSE#1 NA HYALUR & NA CHOND-NA HYALUR IO KIT
PACK | INTRAOCULAR | Status: DC | PRN
  Administered 2022-12-23: 1 via OPHTHALMIC

## 2022-12-23 SURGICAL SUPPLY — 15 items
CATARACT SUITE SIGHTPATH (MISCELLANEOUS) ×1 IMPLANT
CLOTH BEACON ORANGE TIMEOUT ST (SAFETY) ×1 IMPLANT
DRSG TEGADERM 4X4.75 (GAUZE/BANDAGES/DRESSINGS) ×1 IMPLANT
EYE SHIELD UNIVERSAL CLEAR (GAUZE/BANDAGES/DRESSINGS) IMPLANT
FEE CATARACT SUITE SIGHTPATH (MISCELLANEOUS) ×1 IMPLANT
GLOVE BIOGEL PI IND STRL 7.0 (GLOVE) ×2 IMPLANT
LENS IOL RAYNER 23.5 (Intraocular Lens) ×1 IMPLANT
LENS IOL RAYONE EMV 23.5 (Intraocular Lens) IMPLANT
NDL HYPO 18GX1.5 BLUNT FILL (NEEDLE) ×1 IMPLANT
NEEDLE HYPO 18GX1.5 BLUNT FILL (NEEDLE) ×1 IMPLANT
PAD ARMBOARD 7.5X6 YLW CONV (MISCELLANEOUS) ×1 IMPLANT
RING MALYGIN 7.0 (MISCELLANEOUS) IMPLANT
SYR TB 1ML LL NO SAFETY (SYRINGE) ×1 IMPLANT
TAPE SURG TRANSPORE 1 IN (GAUZE/BANDAGES/DRESSINGS) IMPLANT
WATER STERILE IRR 250ML POUR (IV SOLUTION) ×1 IMPLANT

## 2022-12-23 NOTE — Op Note (Signed)
Date of procedure: 12/23/22  Pre-operative diagnosis: Visually significant age-related nuclear cataract, Right Eye (H25.11)  Post-operative diagnosis: Visually significant age-related nuclear cataract, Right Eye  Procedure: Removal of cataract via phacoemulsification and insertion of intra-ocular lens Rayner [id number] +23.5D into the capsular bag of the Right Eye  Attending surgeon: Pecolia Ades, MD  Anesthesia: MAC, Topical Akten  Complications: None  Estimated Blood Loss: <2mL (minimal)  Specimens: None  Implants:  Implant Name Type Inv. Item Serial No. Manufacturer Lot No. LRB No. Used Action  LENS IOL RAYNER 23.5 - [id number] Intraocular Lens LENS IOL RAYNER 23.5  [employer] [id number] Right 1 Implanted    Indications:  Visually significant age-related cataract, Right Eye  Procedure:  The patient was seen and identified in the pre-operative area. The operative eye was identified and dilated.  The operative eye was marked.  Topical anesthesia was administered to the operative eye.     The patient was then to the operative suite and placed in the supine position.  A timeout was performed confirming the patient, procedure to be performed, and all other relevant information.   The patient's face was prepped and draped in the usual fashion for intra-ocular surgery.  A lid speculum was placed into the operative eye and the surgical microscope moved into place and focused.  A superotemporal paracentesis was created using a 20 gauge paracentesis blade.  BSS mixed with Omidria, followed by 1% lidocaine was injected into the anterior chamber.  Viscoelastic was injected into the anterior chamber.  A temporal clear-corneal main wound incision was created using a 2.43mm microkeratome.  A continuous curvilinear capsulorrhexis was initiated using an irrigating cystitome and completed using capsulorrhexis forceps.  Hydrodissection and hydrodeliniation were performed.  Viscoelastic was injected into  the anterior chamber.  A phacoemulsification handpiece and a chopper as a second instrument were used to remove the nucleus and epinucleus. The irrigation/aspiration handpiece was used to remove any remaining cortical material.   The capsular bag was reinflated with viscoelastic, checked, and found to be intact.  The intraocular lens was inserted into the capsular bag.  The irrigation/aspiration handpiece was used to remove any remaining viscoelastic.  The clear corneal wound and paracentesis wounds were then hydrated and checked with Weck-Cels to be watertight.  The lid-speculum and drape was removed, and the patient's face was cleaned with a wet and dry 4x4.  Moxifloxacin drops were instilled onto the eye. A clear shield was taped over the eye. The patient was taken to the post-operative care unit in good condition, having tolerated the procedure well.  Post-Op Instructions: The patient will follow up at [facility] for a same day post-operative evaluation and will receive all other orders and instructions.

## 2022-12-23 NOTE — Anesthesia Preprocedure Evaluation (Signed)
Anesthesia Evaluation  Patient identified by MRN, date of birth, ID band Patient awake    Reviewed: Allergy & Precautions, H&P , NPO status , Patient's Chart, lab work & pertinent test results  History of Anesthesia Complications (+) PONV and history of anesthetic complications  Airway Mallampati: II  TM Distance: >3 FB     Dental  (+) Dental Advisory Given, Teeth Intact   Pulmonary neg pulmonary ROS, former smoker   Pulmonary exam normal breath sounds clear to auscultation       Cardiovascular hypertension, Pt. on medications Normal cardiovascular exam Rhythm:Regular Rate:Normal     Neuro/Psych  PSYCHIATRIC DISORDERS Anxiety Depression    negative neurological ROS     GI/Hepatic negative GI ROS, Neg liver ROS,,,  Endo/Other  Hypothyroidism    Renal/GU negative Renal ROS  negative genitourinary   Musculoskeletal  (+) Arthritis , Osteoarthritis,    Abdominal   Peds negative pediatric ROS (+)  Hematology negative hematology ROS (+)   Anesthesia Other Findings   Reproductive/Obstetrics negative OB ROS                             Anesthesia Physical Anesthesia Plan  ASA: 2  Anesthesia Plan: MAC   Post-op Pain Management: Minimal or no pain anticipated   Induction:   PONV Risk Score and Plan: 0 and Treatment may vary due to age or medical condition  Airway Management Planned: Nasal Cannula and Natural Airway  Additional Equipment:   Intra-op Plan:   Post-operative Plan:   Informed Consent: I have reviewed the patients History and Physical, chart, labs and discussed the procedure including the risks, benefits and alternatives for the proposed anesthesia with the patient or authorized representative who has indicated his/her understanding and acceptance.     Dental advisory given  Plan Discussed with: CRNA and Surgeon  Anesthesia Plan Comments:         Anesthesia  Quick Evaluation

## 2022-12-23 NOTE — Transfer of Care (Signed)
Immediate Anesthesia Transfer of Care Note  Patient: Darlene Crawford  Procedure(s) Performed: CATARACT EXTRACTION PHACO AND INTRAOCULAR LENS PLACEMENT (IOC) (Right: Eye)  Patient Location: PACU  Anesthesia Type:MAC  Level of Consciousness: awake, alert , oriented, and patient cooperative  Airway & Oxygen Therapy: Patient Spontanous Breathing  Post-op Assessment: Report given to RN, Post -op Vital signs reviewed and stable, and Patient moving all extremities X 4  Post vital signs: Reviewed and stable  Last Vitals:  Vitals Value Taken Time  BP /   Temp 97.5   Pulse 68   Resp 17   SpO2 100     Last Pain:  Vitals:   12/23/22 0650  TempSrc: Oral  PainSc: 0-No pain         Complications: No notable events documented.

## 2022-12-23 NOTE — Interval H&P Note (Signed)
History and Physical Interval Note:  12/23/2022 7:12 AM  The H and P was reviewed and updated. The patient was examined.  No changes were found after exam.  The surgical eye was marked.  Darlene Crawford

## 2022-12-23 NOTE — Anesthesia Postprocedure Evaluation (Signed)
Anesthesia Post Note  Patient: Darlene Crawford  Procedure(s) Performed: CATARACT EXTRACTION PHACO AND INTRAOCULAR LENS PLACEMENT (IOC) (Right: Eye)  Patient location during evaluation: Phase II Anesthesia Type: MAC Level of consciousness: awake and alert and oriented Pain management: pain level controlled Vital Signs Assessment: post-procedure vital signs reviewed and stable Respiratory status: spontaneous breathing, nonlabored ventilation and respiratory function stable Cardiovascular status: blood pressure returned to baseline and stable Postop Assessment: no apparent nausea or vomiting Anesthetic complications: no  No notable events documented.   Last Vitals:  Vitals:   12/23/22 0650 12/23/22 0752  BP: 136/76 133/73  Pulse: 71 65  Resp: (!) 22 10  Temp: 36.6 C (!) 36.4 C  SpO2: 98% 100%    Last Pain:  Vitals:   12/23/22 0752  TempSrc: Oral  PainSc: 0-No pain                 Chemeka Filice C Danaysha Kirn

## 2022-12-23 NOTE — Discharge Instructions (Signed)
Please discharge patient when stable, will follow up today with Dr. Nysa Sarin at the [facility] [place] office immediately following discharge.  Leave shield in place until visit.  All paperwork with discharge instructions will be given at the office.  [facility] [place] Address:  [address]  [place], [place] [address]  Dr. Kacee Sukhu's Phone: [phone]  

## 2022-12-28 ENCOUNTER — Encounter (HOSPITAL_COMMUNITY): Payer: Self-pay | Admitting: Optometry

## 2023-01-03 ENCOUNTER — Encounter (INDEPENDENT_AMBULATORY_CARE_PROVIDER_SITE_OTHER): Payer: Self-pay | Admitting: Ophthalmology

## 2023-01-03 ENCOUNTER — Ambulatory Visit (INDEPENDENT_AMBULATORY_CARE_PROVIDER_SITE_OTHER): Payer: Medicare HMO | Admitting: Ophthalmology

## 2023-01-03 DIAGNOSIS — H35412 Lattice degeneration of retina, left eye: Secondary | ICD-10-CM

## 2023-01-03 DIAGNOSIS — H35033 Hypertensive retinopathy, bilateral: Secondary | ICD-10-CM | POA: Diagnosis not present

## 2023-01-03 DIAGNOSIS — Z961 Presence of intraocular lens: Secondary | ICD-10-CM

## 2023-01-03 DIAGNOSIS — H35711 Central serous chorioretinopathy, right eye: Secondary | ICD-10-CM

## 2023-01-03 DIAGNOSIS — H25812 Combined forms of age-related cataract, left eye: Secondary | ICD-10-CM

## 2023-01-03 DIAGNOSIS — H353211 Exudative age-related macular degeneration, right eye, with active choroidal neovascularization: Secondary | ICD-10-CM

## 2023-01-03 DIAGNOSIS — H25813 Combined forms of age-related cataract, bilateral: Secondary | ICD-10-CM

## 2023-01-03 DIAGNOSIS — H269 Unspecified cataract: Secondary | ICD-10-CM | POA: Insufficient documentation

## 2023-01-03 DIAGNOSIS — H43811 Vitreous degeneration, right eye: Secondary | ICD-10-CM

## 2023-01-03 DIAGNOSIS — I10 Essential (primary) hypertension: Secondary | ICD-10-CM

## 2023-01-03 MED ORDER — AFLIBERCEPT 2MG/0.05ML IZ SOLN FOR KALEIDOSCOPE
2.0000 mg | INTRAVITREAL | Status: AC | PRN
  Administered 2023-01-03: 2 mg via INTRAVITREAL

## 2023-01-16 DIAGNOSIS — Z299 Encounter for prophylactic measures, unspecified: Secondary | ICD-10-CM | POA: Diagnosis not present

## 2023-01-16 DIAGNOSIS — M5431 Sciatica, right side: Secondary | ICD-10-CM | POA: Diagnosis not present

## 2023-01-16 DIAGNOSIS — R52 Pain, unspecified: Secondary | ICD-10-CM | POA: Diagnosis not present

## 2023-01-16 DIAGNOSIS — N1831 Chronic kidney disease, stage 3a: Secondary | ICD-10-CM | POA: Diagnosis not present

## 2023-01-16 DIAGNOSIS — I10 Essential (primary) hypertension: Secondary | ICD-10-CM | POA: Diagnosis not present

## 2023-02-08 NOTE — Progress Notes (Signed)
[facility] - Clinic Note  02/14/2023     CHIEF COMPLAINT Patient presents for Retina Follow Up  HISTORY OF PRESENT ILLNESS: Darlene Crawford is a 82 y.o. 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  This started 6 weeks ago.  I, the attending physician,  performed the HPI with the patient and updated documentation appropriately.        Comments   Patient here for 6 weeks retina follow up for exu ARMD OD. Patient states vision had cataract surgery OD. Can see everything bright. Has distortion in peoples faces. When sees letters has a little break. On June 28th having cataract surgery OS. No eye pain. Done with drops for OD.       Last edited by Rennis Chris, MD on 02/14/2023  5:45 PM.    Pt is having left eye cataract sx on June 28th, she states she is seeing distortion in faces   Referring physician: Wendall Papa [address] [place],  [place] [address]  HISTORICAL INFORMATION:  Selected notes from the MEDICAL RECORD NUMBER Referred by Dr. Monico Blitz for concern of ARMD OD with progression to CNVM vs CSR   CURRENT MEDICATIONS: Current Outpatient Medications (Ophthalmic Drugs)  Medication Sig   ARTIFICIAL TEAR SOLUTION OP Place 1 drop into both eyes daily as needed (dry eyes).   Current Facility-Administered Medications (Ophthalmic Drugs)  Medication Route   aflibercept (EYLEA) SOLN 2 mg Intravitreal   aflibercept (EYLEA) SOLN 2 mg Intravitreal   aflibercept (EYLEA) SOLN 2 mg Intravitreal   Current Outpatient Medications (Other)  Medication Sig   acidophilus (RISAQUAD) CAPS capsule Take 1 capsule by mouth daily.   atorvastatin (LIPITOR) 10 MG tablet Take 10 mg by mouth at bedtime.    Cholecalciferol (VITAMIN D) 50 MCG (2000 UT) tablet Take 4,000 Units by mouth at bedtime.    Cyanocobalamin (B-12 PO) Take 1 Dose by mouth 2 (two) times a week.   fluticasone (FLONASE) 50  MCG/ACT nasal spray Place 1 spray into both nostrils daily as needed for allergies or rhinitis.   hydrochlorothiazide (HYDRODIURIL) 25 MG tablet Take 25 mg by mouth daily. 1/2 tablet am, 1/2 tablet pm   levothyroxine (SYNTHROID, LEVOTHROID) 100 MCG tablet Take 100 mcg by mouth daily before breakfast.   loratadine (CLARITIN) 10 MG tablet Take 10 mg by mouth daily as needed for allergies.   losartan (COZAAR) 100 MG tablet Take 100 mg by mouth daily.    Melatonin 10 MG CAPS Take 10 mg by mouth at bedtime.   Multiple Vitamins-Minerals (PRESERVISION AREDS 2 PO) Take 1 tablet by mouth at bedtime.    sodium chloride (OCEAN) 0.65 % SOLN nasal spray Place 1 spray into both nostrils as needed for congestion.   sertraline (ZOLOFT) 50 MG tablet Take 50 mg by mouth daily. (Patient not taking: Reported on 02/14/2023)   Current Facility-Administered Medications (Other)  Medication Route   Bevacizumab (AVASTIN) SOLN 1.25 mg Intravitreal   Bevacizumab (AVASTIN) SOLN 1.25 mg Intravitreal   Bevacizumab (AVASTIN) SOLN 1.25 mg Intravitreal   REVIEW OF SYSTEMS: ROS   Positive for: Genitourinary, Musculoskeletal, Endocrine, Eyes, Psychiatric, Allergic/Imm Negative for: Constitutional, Gastrointestinal, Neurological, Skin, HENT, Cardiovascular, Respiratory, Heme/Lymph Last edited by Laddie Aquas, COA on 02/14/2023 12:59 PM.       ALLERGIES Allergies  Allergen Reactions   Trilyte [Peg 3350-Kcl-Na  Bicarb-Nacl]     HEMATEMESIS   Vicodin [Hydrocodone-Acetaminophen] Nausea And Vomiting   PAST MEDICAL HISTORY Past Medical History:  Diagnosis Date   Anxiety and depression    Cataract    Hypercholesterolemia    Hypertension    Hypertensive retinopathy    OU   Hypothyroidism    Macular degeneration    OD   Osteoarthritis    PONV (postoperative nausea and vomiting)    Seasonal allergies    Past Surgical History:  Procedure Laterality Date   BIOPSY  10/01/2018   Procedure: BIOPSY;  Surgeon: West Bali, MD;  Location: [facility];  Service: Endoscopy;;  gastric bx & gastric polyp   CATARACT EXTRACTION W/PHACO Right 12/23/2022   Procedure: CATARACT EXTRACTION PHACO AND INTRAOCULAR LENS PLACEMENT (IOC);  Surgeon: Pecolia Ades, MD;  Location: [facility];  Service: Ophthalmology;  Laterality: Right;  CDE: 11.88   COLONOSCOPY N/A 10/01/2018   Procedure: COLONOSCOPY;  Surgeon: West Bali, MD;  Location: [facility];  Service: Endoscopy;  Laterality: N/A;  10:30am   ESOPHAGOGASTRODUODENOSCOPY N/A 10/01/2018   Procedure: ESOPHAGOGASTRODUODENOSCOPY (EGD);  Surgeon: West Bali, MD;  Location: [facility];  Service: Endoscopy;  Laterality: N/A;   POLYPECTOMY  10/01/2018   Procedure: POLYPECTOMY;  Surgeon: West Bali, MD;  Location: [facility];  Service: Endoscopy;;  colon    TUBAL LIGATION     UTERINE FIBROID SURGERY     FAMILY HISTORY Family History  Problem Relation Age of Onset   Cancer Mother        lung   Stroke Father    Stroke Brother    Colon cancer Neg Hx    SOCIAL HISTORY Social History   Tobacco Use   Smoking status: Former    Packs/day: 0.25    Years: 4.00    Additional pack years: 0.00    Total pack years: 1.00    Types: Cigarettes    Quit date: 11/04/1964    Years since quitting: 58.3   Smokeless tobacco: Never  Vaping Use   Vaping Use: Never used  Substance Use Topics   Alcohol use: No   Drug use: No       OPHTHALMIC EXAM:  Base Eye Exam     Visual Acuity (Snellen - Linear)       Right Left   Dist [place] 20/25 -2    Dist cc  20/20   Dist ph  20/20 -2     Correction: Glasses         Tonometry (Tonopen, 12:55 PM)       Right Left   Pressure 07 12         Pupils       Dark Light Shape React APD   Right 3 2 Round Brisk None   Left 3 2 Round Brisk None         Visual Fields (Counting fingers)       Left Right    Full Full         Extraocular Movement       Right Left    Full, Ortho Full, Ortho          Neuro/Psych     Oriented x3: Yes   Mood/Affect: Normal         Dilation     Both eyes: 1.0% Mydriacyl, 2.5% Phenylephrine @ 12:55 PM           Slit Lamp and Fundus Exam  Slit Lamp Exam       Right Left   Lids/Lashes Dermatochalasis - upper lid Dermatochalasis - upper lid   Conjunctiva/Sclera White and quiet White and quiet   Cornea Arcus, well healed cataract wound Arcus, early nasal band K, 1+PEE   Anterior Chamber Deep, 1-2+ fine cell / pigment moderate depth, with narrow angle temporally   Iris Round and well dilated Round and well dilated   Lens PC IOL in good position 2-3+ Nuclear sclerosis, 2-3+ Cortical cataract   Anterior Vitreous Vitreous syneresis, Posterior vitreous detachment, Weiss ring Vitreous syneresis         Fundus Exam       Right Left   Disc Pink and Sharp, Compact Pink and Sharp, Tilted disc   C/D Ratio 0.2 0.3   Macula Flat, good foveal reflex; central SRF - slightly improved; +drusen; +RPE mottling and clumping, small cluster of drusen and RPE mottling inferior fovea, no heme Flat, good foveal reflex, Retinal pigment epithelial mottling, superior para-foveal focal area of RPE atrophy -- stable, rare drusen, No heme or edema   Vessels attenuated, Tortuous, mild AV crossing changes Vascular attenuation, Tortuous   Periphery Attached, peripheral drusen / RPE changes nasally, peripheral cystoid degeneration Attached, very mild lattice at 0430, mild patch of Lattice degeneration at 0600 -- stable, scattered peripheral drusen, peripheral cystoid degeneration           Refraction     Wearing Rx       Sphere Cylinder Axis Add   Right +2.00 +2.00 160 +2.50   Left +2.50 +2.00 020 +2.50           IMAGING AND PROCEDURES  Imaging and Procedures for 12/11/17  OCT, Retina - OU - Both Eyes       Right Eye Quality was good. Central Foveal Thickness: 370. Progression has improved. Findings include normal foveal contour, no IRF, retinal drusen  , pigment epithelial detachment, subretinal fluid, outer retinal atrophy (Mild Interval improvement in central SRF).   Left Eye Quality was good. Central Foveal Thickness: 274. Progression has been stable. Findings include normal foveal contour, no IRF, no SRF, retinal drusen .   Notes *Images captured and stored on drive  Diagnosis / Impression:  OD: NFP; No IRF; Mild Interval improvement in central SRF OS: NFP, No IRF/SRF  Clinical management:  See below  Abbreviations: NFP - Normal foveal profile. CME - cystoid macular edema. PED - pigment epithelial detachment. IRF - intraretinal fluid. SRF - subretinal fluid. EZ - ellipsoid zone. ERM - epiretinal membrane. ORA - outer retinal atrophy. ORT - outer retinal tubulation. SRHM - subretinal hyper-reflective material       Intravitreal Injection, Pharmacologic Agent - OD - Right Eye       Time Out 02/14/2023. 1:43 PM. Confirmed correct patient, procedure, site, and patient consented.   Anesthesia Topical anesthesia was used. Anesthetic medications included Lidocaine 2%, Proparacaine 0.5%.   Procedure Preparation included 5% betadine to ocular surface, eyelid speculum. A (32g) needle was used.   Injection: 2 mg aflibercept 2 MG/0.05ML   Route: Intravitreal, Site: Right Eye   NDC: [id number], Lot: [id number], Expiration date: 12/27/2023, Waste: 0 mL   Post-op Post injection exam found visual acuity of at least counting fingers. The patient tolerated the procedure well. There were no complications. The patient received written and verbal post procedure care education. Post injection medications were not given.            ASSESSMENT/PLAN:    ICD-10-CM  1. Exudative age-related macular degeneration of right eye with active choroidal neovascularization (HCC)  H35.3211 OCT, Retina - OU - Both Eyes    Intravitreal Injection, Pharmacologic Agent - OD - Right Eye    aflibercept (EYLEA) SOLN 2 mg    2. Central serous  chorioretinopathy of right eye  H35.711     3. Essential hypertension  I10     4. Hypertensive retinopathy of both eyes  H35.033     5. 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 Posterior vitreous detachment of right eye  H43.811     7. Combined forms of age-related cataract of left eye  H25.812     8. Pseudophakia  Z96.1       1,2. Exudative age related macular degeneration vs CSCR OD  - FA (03.02.19) without leakage / pooling into area of SRF  - review of systems reveals significant stressors in life, but no exogenous steroid use  - S/P IVA #1 OD (09.27.19),  #2 (10.25.19), #3 (11.22.19) -- minimal response -- IVA resistance  - pt approved for [employer] and IVE  ================================================================================ - S/P IVE OD #1 (12.20.19), #2 (01.20.20), #3 (02.18.20), #4 (07.20.20) #5 (09.01.20), #6 (10.13.20), #7 (11.20.20), #8 (01.20.21), #9 (02.17.21), #10 (03.19.21), #11 (04.20.21), #12 (05.25.21), #13 (6.23.21), #14 (07.27.21), #15 (08.30.21), #16 (9.28.21), #17 (10.26.21), #18 (11.29.21), #19 (01.11.22), #20 (02.08.22), #21 (3.8.22), #22 (04.05.22), #23 (5.10.22), #24 (06.21.22), #25 (8.2.22), #26 (9.20.22), #27 (11.15.22), #28 (01.17.23), #29 (03.29.23), #30 (06.21.23), #31 (09.27.23), #32 (01.16.24), #33 (05.07.24)  - lost to f/u from Feb 2020 to July 2020 due to COVID-19 concerns  - today, [facility] 20/20 OD -- stably improved post cataract surgery despite recurrent SRF  - OCT today Mild Interval improvement in central SRF at 6 weeks  - review of OCTs shows last time she had SRF was Jan 2022  - recommend IVE OD #34 today, 06.18.24 -- with follow at 6 wks again  - pt wishes to proceed with IVE OD  - RBA of procedure discussed, questions answered  - informed consent obtained   - Eylea informed consent form signed and scanned on 01.17.23  - see procedure note  - [email or web address] benefits investigation initiated -- approved for 2024  - f/u 6 weeks --  DFE/OCT/ possible injection  3,4. Hypertensive retinopathy OU  - discussed importance of tight BP control  - stable  - monitor  5. Lattice degeneration OS  - patch of inferior lattice OS -- no RT, holes or SRF  - asymptomatic   - discussed findings, prognosis, and treatment options including observation  - monitor  6. PVD / vitreous syneresis OD-   - Discussed findings and prognosis  - No RT or RD on 360 peripheral exam  - Reviewed s/s of RT/RD  - Strict return precautions for any such RT/RD signs/symptoms  7. Combined form age-related cataract OS  - The symptoms of cataract, surgical options, and treatments and risks were discussed with patient.  - discussed diagnosis and progression  - under the expert management of Dr. Ilsa Iha -- scheduled for surgery next week (Friday, 06.28.24)  8. Pseudophakia OD  - s/p CE/IOL OD (Dr. Ilsa Iha, 04.26.24)  - IOL in good position, doing well  - monitor   Ophthalmic Meds Ordered this visit:  Meds ordered this encounter  Medications   aflibercept (EYLEA) SOLN 2 mg     Return in about 6 weeks (around 03/28/2023) for f/u exu ARMD OU, DFE, OCT.  There are  no Patient Instructions on file for this visit.   This document serves as a record of services personally performed by Karie Chimera, MD, PhD. It was created on their behalf by Gerilyn Nestle, COT an ophthalmic technician. The creation of this record is the provider's dictation and/or activities during the visit.    Electronically signed by:  Charlette Caffey, COT  02/14/23 5:46 PM  This document serves as a record of services personally performed by Karie Chimera, MD, PhD. It was created on their behalf by Glee Arvin. Manson Passey, OA an ophthalmic technician. The creation of this record is the provider's dictation and/or activities during the visit.    Electronically signed by: Glee Arvin. Manson Passey, [place] 06.18.2024 5:46 PM  Karie Chimera, M.D., Ph.D. Diseases & Surgery of the Retina and  Vitreous [facility]  I have reviewed the above documentation for accuracy and completeness, and I agree with the above. Karie Chimera, M.D., Ph.D. 02/14/23 5:47 PM  Abbreviations: M myopia (nearsighted); A astigmatism; H hyperopia (farsighted); P presbyopia; Mrx spectacle prescription;  CTL contact lenses; OD right eye; OS left eye; OU both eyes  XT exotropia; ET esotropia; PEK punctate epithelial keratitis; PEE punctate epithelial erosions; DES dry eye syndrome; MGD meibomian gland dysfunction; ATs artificial tears; PFAT's preservative free artificial tears; NSC nuclear sclerotic cataract; PSC posterior subcapsular cataract; ERM epi-retinal membrane; PVD posterior vitreous detachment; RD retinal detachment; DM diabetes mellitus; DR diabetic retinopathy; NPDR non-proliferative diabetic retinopathy; PDR proliferative diabetic retinopathy; CSME clinically significant macular edema; DME diabetic macular edema; dbh dot blot hemorrhages; CWS cotton wool spot; POAG primary open angle glaucoma; C/D cup-to-disc ratio; HVF humphrey visual field; GVF goldmann visual field; OCT optical coherence tomography; IOP intraocular pressure; BRVO Branch retinal vein occlusion; CRVO central retinal vein occlusion; CRAO central retinal artery occlusion; BRAO branch retinal artery occlusion; RT retinal tear; SB scleral buckle; PPV pars plana vitrectomy; VH Vitreous hemorrhage; PRP panretinal laser photocoagulation; IVK intravitreal kenalog; VMT vitreomacular traction; MH Macular hole;  NVD neovascularization of the disc; NVE neovascularization elsewhere; AREDS age related eye disease study; ARMD age related macular degeneration; POAG primary open angle glaucoma; EBMD epithelial/anterior basement membrane dystrophy; ACIOL anterior chamber intraocular lens; IOL intraocular lens; PCIOL posterior chamber intraocular lens; Phaco/IOL phacoemulsification with intraocular lens placement; PRK photorefractive  keratectomy; LASIK laser assisted in situ keratomileusis; HTN hypertension; DM diabetes mellitus; COPD chronic obstructive pulmonary disease

## 2023-02-14 ENCOUNTER — Encounter (INDEPENDENT_AMBULATORY_CARE_PROVIDER_SITE_OTHER): Payer: Self-pay | Admitting: Ophthalmology

## 2023-02-14 ENCOUNTER — Ambulatory Visit (INDEPENDENT_AMBULATORY_CARE_PROVIDER_SITE_OTHER): Payer: Medicare HMO | Admitting: Ophthalmology

## 2023-02-14 DIAGNOSIS — H35033 Hypertensive retinopathy, bilateral: Secondary | ICD-10-CM

## 2023-02-14 DIAGNOSIS — H35711 Central serous chorioretinopathy, right eye: Secondary | ICD-10-CM

## 2023-02-14 DIAGNOSIS — H35412 Lattice degeneration of retina, left eye: Secondary | ICD-10-CM | POA: Diagnosis not present

## 2023-02-14 DIAGNOSIS — H353211 Exudative age-related macular degeneration, right eye, with active choroidal neovascularization: Secondary | ICD-10-CM | POA: Diagnosis not present

## 2023-02-14 DIAGNOSIS — H25812 Combined forms of age-related cataract, left eye: Secondary | ICD-10-CM

## 2023-02-14 DIAGNOSIS — I10 Essential (primary) hypertension: Secondary | ICD-10-CM | POA: Diagnosis not present

## 2023-02-14 DIAGNOSIS — Z961 Presence of intraocular lens: Secondary | ICD-10-CM

## 2023-02-14 DIAGNOSIS — H43811 Vitreous degeneration, right eye: Secondary | ICD-10-CM | POA: Diagnosis not present

## 2023-02-14 MED ORDER — AFLIBERCEPT 2MG/0.05ML IZ SOLN FOR KALEIDOSCOPE
2.0000 mg | INTRAVITREAL | Status: AC | PRN
Start: 2023-02-14 — End: 2023-02-14
  Administered 2023-02-14: 2 mg via INTRAVITREAL

## 2023-02-16 NOTE — H&P (Signed)
Surgical History & Physical  Patient Name: Darlene Crawford  DOB: 1941/01/16  Surgery: Cataract extraction with intraocular lens implant phacoemulsification; Left Eye Surgeon: Pecolia Ades MD Surgery Date: 02/24/2023 Pre-Op Date: 01/03/2023  HPI: A 42 Yr. old female patient present for 11 day post op OD. When watching TV, faces look distorted. She is seeing Dr. Vanessa Barbara today for possible fluid OD.  Patient is having difficulties seeing captions on TV and glare problems on sunny days and at night. This is negatively affecting the patient's quality of life and the patient is unable to function adequately in life with the current level of vision. Patient would like to proceed with cataract sx.  Medical History: Macula Degeneration Cataracts CSR OD, Hypertensive Retinopathy OU, lattice degeneration OU  Arthritis High Blood Pressure LDL Thyroid Problems anxiety, depression  Review of Systems Cardiovascular High Blood Pressure Gastrointestinal getting over stomach bug, still not eating well Psychiatry Anxiety, Depression All recorded systems are negative except as noted above.  Social Former smoker   Medication Prednisolone-Moxifloxacin-Bromfenac,  Acidophllus, Atorvastatin, Fluticasone, Hydrochlorothiazide, Levothyroxine, Loratadine, Losartan, Melatonin, Sertraline  Sx/Procedures Intraocular Injectons OD- Eylea, Phaco c IOL OD,  Tubal Ligation, Polypectomy, Uterine fibroid sx  Drug Allergies  Trilyte, Vicodin  History & Physical: Heent: PCL OD, cataract OS NECK: supple without bruits LUNGS: lungs clear to auscultation CV: regular rate and rhythm Abdomen: soft and non-tender  Impression & Plan: Assessment: 1.  CATARACT EXTRACTION STATUS; Right Eye (Z98.41) 2.  INTRAOCULAR LENS IOL ; Right Eye (Z96.1) 3.  CATARACT AGE-RELATED NUCLEAR; , Left Eye (H25.12) 4.  Hyperopia ; Left Eye (H52.02)  Plan: 1.  POW1.5. Doing well. All post-op precautions discussed and  instructions reviewed. Written instructions given.  2.  See above  3.  Cataracts are visually significant and account for the patient's complaints. Discussed all risks, benefits, procedures and recovery, including infection, loss of vision and eye, need for glasses after surgery or additional procedures. Patient understands changing glasses will not improve vision. Patient indicated understanding of procedure. All questions answered. Patient desires to have surgery, recommend phacoemulsification with intraocular lens. Patient to have preliminary testing necessary ([employer]/IOL Master, Mac OCT, TOPO) Educational materials provided.  Plan: - Proceed with cataract surgery OS - target best distance, RayOne lens - Hx of wet AMD and injections OD - no DM, no fuchs, no prior eye surgeries, ok with lying flat  4.  Continue current for now

## 2023-02-21 ENCOUNTER — Encounter (HOSPITAL_COMMUNITY)
Admission: RE | Admit: 2023-02-21 | Discharge: 2023-02-21 | Disposition: A | Discharge status: Home / Self Care | Payer: Medicare HMO | Source: Ambulatory Visit | Attending: Optometry | Admitting: Optometry

## 2023-02-24 ENCOUNTER — Encounter (HOSPITAL_COMMUNITY): Payer: Self-pay | Admitting: Optometry

## 2023-02-24 ENCOUNTER — Encounter (HOSPITAL_COMMUNITY)
Admission: RE | Disposition: A | Discharge status: Home / Self Care | Payer: Self-pay | Source: Home / Self Care | Attending: Optometry

## 2023-02-24 ENCOUNTER — Ambulatory Visit (HOSPITAL_BASED_OUTPATIENT_CLINIC_OR_DEPARTMENT_OTHER): Payer: Medicare HMO | Admitting: Anesthesiology

## 2023-02-24 ENCOUNTER — Ambulatory Visit (HOSPITAL_COMMUNITY): Payer: Medicare HMO | Admitting: Anesthesiology

## 2023-02-24 ENCOUNTER — Ambulatory Visit (HOSPITAL_COMMUNITY)
Admission: RE | Admit: 2023-02-24 | Discharge: 2023-02-24 | Disposition: A | Discharge status: Home / Self Care | Payer: Medicare HMO | Attending: Optometry | Admitting: Optometry

## 2023-02-24 ENCOUNTER — Other Ambulatory Visit: Payer: Self-pay

## 2023-02-24 DIAGNOSIS — I10 Essential (primary) hypertension: Secondary | ICD-10-CM | POA: Diagnosis not present

## 2023-02-24 DIAGNOSIS — F419 Anxiety disorder, unspecified: Secondary | ICD-10-CM | POA: Insufficient documentation

## 2023-02-24 DIAGNOSIS — H2512 Age-related nuclear cataract, left eye: Secondary | ICD-10-CM

## 2023-02-24 DIAGNOSIS — F32A Depression, unspecified: Secondary | ICD-10-CM | POA: Diagnosis not present

## 2023-02-24 DIAGNOSIS — E039 Hypothyroidism, unspecified: Secondary | ICD-10-CM | POA: Diagnosis not present

## 2023-02-24 DIAGNOSIS — H35039 Hypertensive retinopathy, unspecified eye: Secondary | ICD-10-CM | POA: Diagnosis not present

## 2023-02-24 DIAGNOSIS — Z79890 Hormone replacement therapy (postmenopausal): Secondary | ICD-10-CM | POA: Diagnosis not present

## 2023-02-24 DIAGNOSIS — Z87891 Personal history of nicotine dependence: Secondary | ICD-10-CM | POA: Insufficient documentation

## 2023-02-24 DIAGNOSIS — Z79899 Other long term (current) drug therapy: Secondary | ICD-10-CM | POA: Diagnosis not present

## 2023-02-24 DIAGNOSIS — E7800 Pure hypercholesterolemia, unspecified: Secondary | ICD-10-CM | POA: Diagnosis not present

## 2023-02-24 HISTORY — PX: CATARACT EXTRACTION W/PHACO: SHX586

## 2023-02-24 SURGERY — PHACOEMULSIFICATION, CATARACT, WITH IOL INSERTION
Anesthesia: Monitor Anesthesia Care | Site: Eye | Laterality: Left

## 2023-02-24 MED ORDER — NEOMYCIN-POLYMYXIN-DEXAMETH 3.5-10000-0.1 OP SUSP
OPHTHALMIC | Status: DC | PRN
  Administered 2023-02-24: 2 [drp] via OPHTHALMIC

## 2023-02-24 MED ORDER — SIGHTPATH DOSE#1 NA HYALUR & NA CHOND-NA HYALUR IO KIT
PACK | INTRAOCULAR | Status: DC | PRN
  Administered 2023-02-24: 1 via OPHTHALMIC

## 2023-02-24 MED ORDER — PHENYLEPHRINE HCL 2.5 % OP SOLN
1.0000 [drp] | OPHTHALMIC | Status: AC | PRN
  Administered 2023-02-24 (×3): 1 [drp] via OPHTHALMIC

## 2023-02-24 MED ORDER — TROPICAMIDE 1 % OP SOLN
1.0000 [drp] | OPHTHALMIC | Status: AC | PRN
  Administered 2023-02-24 (×3): 1 [drp] via OPHTHALMIC

## 2023-02-24 MED ORDER — PHENYLEPHRINE-KETOROLAC 1-0.3 % IO SOLN
INTRAOCULAR | Status: DC | PRN
  Administered 2023-02-24: 500 mL via OPHTHALMIC

## 2023-02-24 MED ORDER — LIDOCAINE HCL (PF) 1 % IJ SOLN
INTRAMUSCULAR | Status: DC | PRN
  Administered 2023-02-24: 1 mL

## 2023-02-24 MED ORDER — MIDAZOLAM HCL 2 MG/2ML IJ SOLN
INTRAMUSCULAR | Status: AC
  Filled 2023-02-24: qty 2

## 2023-02-24 MED ORDER — POVIDONE-IODINE 5 % OP SOLN
OPHTHALMIC | Status: DC | PRN
  Administered 2023-02-24: 1 via OPHTHALMIC

## 2023-02-24 MED ORDER — LACTATED RINGERS IV SOLN: INTRAVENOUS | Status: DC

## 2023-02-24 MED ORDER — BSS IO SOLN
INTRAOCULAR | Status: DC | PRN
  Administered 2023-02-24: 15 mL via INTRAOCULAR

## 2023-02-24 MED ORDER — PHENYLEPHRINE-KETOROLAC 1-0.3 % IO SOLN
INTRAOCULAR | Status: AC
  Filled 2023-02-24: qty 4

## 2023-02-24 MED ORDER — LIDOCAINE HCL 3.5 % OP GEL
1.0000 | Freq: Once | OPHTHALMIC | Status: AC
  Administered 2023-02-24: 1 via OPHTHALMIC

## 2023-02-24 MED ORDER — STERILE WATER FOR IRRIGATION IR SOLN
Status: DC | PRN
  Administered 2023-02-24: 250 mL

## 2023-02-24 MED ORDER — TETRACAINE HCL 0.5 % OP SOLN
1.0000 [drp] | OPHTHALMIC | Status: AC | PRN
  Administered 2023-02-24 (×3): 1 [drp] via OPHTHALMIC

## 2023-02-24 MED ORDER — SODIUM CHLORIDE 0.9% FLUSH
INTRAVENOUS | Status: DC | PRN
  Administered 2023-02-24 (×2): 3 mL via INTRAVENOUS

## 2023-02-24 MED ORDER — MIDAZOLAM HCL 2 MG/2ML IJ SOLN
INTRAMUSCULAR | Status: DC | PRN
  Administered 2023-02-24 (×2): 1 mg via INTRAVENOUS

## 2023-02-24 SURGICAL SUPPLY — 17 items
CATARACT SUITE SIGHTPATH (MISCELLANEOUS) ×1 IMPLANT
CLOTH BEACON ORANGE TIMEOUT ST (SAFETY) ×1 IMPLANT
DRSG TEGADERM 4X4.75 (GAUZE/BANDAGES/DRESSINGS) ×1 IMPLANT
EYE SHIELD UNIVERSAL CLEAR (GAUZE/BANDAGES/DRESSINGS) IMPLANT
FEE CATARACT SUITE SIGHTPATH (MISCELLANEOUS) ×1 IMPLANT
GLOVE BIOGEL PI IND STRL 7.0 (GLOVE) ×2 IMPLANT
LENS IOL RAYNER 23.0 (Intraocular Lens) ×1 IMPLANT
LENS IOL RAYONE EMV 23.0 (Intraocular Lens) IMPLANT
NDL HYPO 18GX1.5 BLUNT FILL (NEEDLE) ×1 IMPLANT
NEEDLE HYPO 18GX1.5 BLUNT FILL (NEEDLE) ×1 IMPLANT
PAD ARMBOARD 7.5X6 YLW CONV (MISCELLANEOUS) ×1 IMPLANT
POSITIONER HEAD 8X9X4 ADT (SOFTGOODS) ×1 IMPLANT
RING MALYGIN 7.0 (MISCELLANEOUS) IMPLANT
SYR TB 1ML LL NO SAFETY (SYRINGE) ×1 IMPLANT
TAPE PAPER 2X10 WHT MICROPORE (GAUZE/BANDAGES/DRESSINGS) IMPLANT
TAPE SURG TRANSPORE 1 IN (GAUZE/BANDAGES/DRESSINGS) IMPLANT
WATER STERILE IRR 250ML POUR (IV SOLUTION) ×1 IMPLANT

## 2023-02-24 NOTE — Discharge Instructions (Signed)
Please discharge patient when stable, will follow up today with Dr. Lorriann Hansmann at the [facility] office immediately following discharge.  Leave shield in place until visit.  All paperwork with discharge instructions will be given at the office.  [facility] Address:  [address]  [place], [place] [address]  Dr. Dicy Smigel's Phone: [phone]  

## 2023-02-24 NOTE — Anesthesia Procedure Notes (Signed)
Date/Time: 02/24/2023 10:36 AM  Performed by: Franco Nones, CRNAPre-anesthesia Checklist: Patient identified, Emergency Drugs available, Suction available, Timeout performed and Patient being monitored Patient Re-evaluated:Patient Re-evaluated prior to induction Oxygen Delivery Method: Nasal Cannula

## 2023-02-24 NOTE — Anesthesia Preprocedure Evaluation (Signed)
Anesthesia Evaluation  Patient identified by MRN, date of birth, ID band Patient awake    Reviewed: Allergy & Precautions, H&P , NPO status , Patient's Chart, lab work & pertinent test results  History of Anesthesia Complications (+) PONV and history of anesthetic complications  Airway Mallampati: II  TM Distance: >3 FB     Dental  (+) Dental Advisory Given, Teeth Intact   Pulmonary neg pulmonary ROS, former smoker   Pulmonary exam normal breath sounds clear to auscultation       Cardiovascular hypertension, Pt. on medications Normal cardiovascular exam Rhythm:Regular Rate:Normal     Neuro/Psych  PSYCHIATRIC DISORDERS Anxiety Depression    negative neurological ROS     GI/Hepatic negative GI ROS, Neg liver ROS,,,  Endo/Other  Hypothyroidism    Renal/GU negative Renal ROS  negative genitourinary   Musculoskeletal  (+) Arthritis , Osteoarthritis,    Abdominal   Peds negative pediatric ROS (+)  Hematology negative hematology ROS (+)   Anesthesia Other Findings   Reproductive/Obstetrics negative OB ROS                             Anesthesia Physical Anesthesia Plan  ASA: 2  Anesthesia Plan: MAC   Post-op Pain Management: Minimal or no pain anticipated   Induction:   PONV Risk Score and Plan: 0 and Treatment may vary due to age or medical condition  Airway Management Planned: Nasal Cannula and Natural Airway  Additional Equipment:   Intra-op Plan:   Post-operative Plan:   Informed Consent: I have reviewed the patients History and Physical, chart, labs and discussed the procedure including the risks, benefits and alternatives for the proposed anesthesia with the patient or authorized representative who has indicated his/her understanding and acceptance.     Dental advisory given  Plan Discussed with: CRNA and Surgeon  Anesthesia Plan Comments:         Anesthesia  Quick Evaluation  

## 2023-02-24 NOTE — Transfer of Care (Signed)
Immediate Anesthesia Transfer of Care Note  Patient: Darlene Crawford  Procedure(s) Performed: CATARACT EXTRACTION PHACO AND INTRAOCULAR LENS PLACEMENT (IOC) (Left: Eye)  Patient Location: Short Stay  Anesthesia Type:MAC  Level of Consciousness: awake and patient cooperative  Airway & Oxygen Therapy: Patient Spontanous Breathing  Post-op Assessment: Report given to RN and Post -op Vital signs reviewed and stable  Post vital signs: Reviewed and stable  Last Vitals:  Vitals Value Taken Time  BP 119/65 1100  Temp 97.9 1100  Pulse 60 1100  Resp 15 1100  SpO2 100 1100    Last Pain:  Vitals:   02/24/23 0931  TempSrc: Oral  PainSc: 0-No pain         Complications: No notable events documented.

## 2023-02-24 NOTE — Op Note (Signed)
Date of procedure: 02/24/23  Pre-operative diagnosis: Visually significant age-related nuclear cataract, Left Eye (H25.12)  Post-operative diagnosis: Visually significant age-related nuclear cataract, Left Eye  Procedure: Removal of cataract via phacoemulsification and insertion of intra-ocular lens Rayner [id number]+23.0D into the capsular bag of the Left Eye  Attending surgeon: Ronal Fear, MD  Anesthesia: MAC, Topical Akten  Complications: None  Estimated Blood Loss: <36mL (minimal)  Specimens: None  Implants:  Implant Name Type Inv. Item Serial No. Manufacturer Lot No. LRB No. Used Action  LENS IOL RAYNER 23.0 - S73 Intraocular Lens LENS IOL RAYNER 23.0 73 [employer] [id number] Left 1 Implanted    Indications:  Visually significant age-related cataract, Left Eye  Procedure:  The patient was seen and identified in the pre-operative area. The operative eye was identified and dilated.  The operative eye was marked.  Topical anesthesia was administered to the operative eye.     The patient was then to the operative suite and placed in the supine position.  A timeout was performed confirming the patient, procedure to be performed, and all other relevant information.   The patient's face was prepped and draped in the usual fashion for intra-ocular surgery.  A lid speculum was placed into the operative eye and the surgical microscope moved into place and focused.  An inferotemporal paracentesis was created using a 20 gauge paracentesis blade.  BSS mixed with Omidria, followed by 1% lidocaine was injected into the anterior chamber.  Viscoelastic was injected into the anterior chamber.  A temporal clear-corneal main wound incision was created using a 2.86mm microkeratome.  A continuous curvilinear capsulorrhexis was initiated using an irrigating cystitome and completed using capsulorrhexis forceps.  Hydrodissection and hydrodeliniation were performed.  Viscoelastic was injected into the  anterior chamber.  A phacoemulsification handpiece and a chopper as a second instrument were used to remove the nucleus and epinucleus. The irrigation/aspiration handpiece was used to remove any remaining cortical material.   The capsular bag was reinflated with viscoelastic, checked, and found to be intact.  The intraocular lens was inserted into the capsular bag.  The irrigation/aspiration handpiece was used to remove any remaining viscoelastic.  The clear corneal wound and paracentesis wounds were then hydrated and checked with Weck-Cels to be watertight.  The lid-speculum and drape was removed, and the patient's face was cleaned with a wet and dry 4x4.  Maxitrol drops were instilled onto the eye. A clear shield was taped over the eye. The patient was taken to the post-operative care unit in good condition, having tolerated the procedure well.  Post-Op Instructions: The patient will follow up at [facility] for a same day post-operative evaluation and will receive all other orders and instructions.

## 2023-02-24 NOTE — Interval H&P Note (Signed)
History and Physical Interval Note:  02/24/2023 10:08 AM  The H and P was reviewed and updated. The patient was examined.  No changes were found after exam.  The surgical eye was marked.  Darlene Crawford   

## 2023-02-27 NOTE — Anesthesia Postprocedure Evaluation (Signed)
Anesthesia Post Note  Patient: Darlene Crawford  Procedure(s) Performed: CATARACT EXTRACTION PHACO AND INTRAOCULAR LENS PLACEMENT (IOC) (Left: Eye)  Patient location during evaluation: Phase II Anesthesia Type: MAC Level of consciousness: awake Pain management: pain level controlled Vital Signs Assessment: post-procedure vital signs reviewed and stable Respiratory status: spontaneous breathing and respiratory function stable Cardiovascular status: blood pressure returned to baseline and stable Postop Assessment: no headache and no apparent nausea or vomiting Anesthetic complications: no Comments: Late entry   No notable events documented.   Last Vitals:  Vitals:   02/24/23 0931 02/24/23 1059  BP: (!) 149/69 119/65  Pulse: 70 66  Resp: (!) 21 15  Temp: 36.6 C 37.1 C  SpO2: 99% 100%    Last Pain:  Vitals:   02/27/23 1019  TempSrc:   PainSc: 0-No pain                 Windell Norfolk

## 2023-02-28 ENCOUNTER — Encounter (HOSPITAL_COMMUNITY): Payer: Self-pay | Admitting: Optometry

## 2023-03-16 NOTE — Progress Notes (Signed)
[facility] - Clinic Note  03/28/2023     CHIEF COMPLAINT Patient presents for Retina Follow Up  HISTORY OF PRESENT ILLNESS: Darlene Crawford is a 82 y.o. female who presents to the clinic today for:   HPI     Retina Follow Up   Patient presents with  Other.  This started 6 weeks ago.  Duration of 6 weeks.  Since onset it is stable.  I, the attending physician,  performed the HPI with the patient and updated documentation appropriately.        Comments   6 week retina follow up and AMD OU and I'VE OD pt is reporting no vision changes noticed she has some floaters denies flashes pt had cataract surgery OD May OS June       Last edited by Rennis Chris, MD on 03/28/2023  2:56 PM.    Patient states the vision is not as good as it has been.    Referring physician: Wendall Papa [address] [place],  [place] [address]  HISTORICAL INFORMATION:  Selected notes from the MEDICAL RECORD NUMBER Referred by Dr. Monico Blitz for concern of ARMD OD with progression to CNVM vs CSR   CURRENT MEDICATIONS: Current Outpatient Medications (Ophthalmic Drugs)  Medication Sig   ARTIFICIAL TEAR SOLUTION OP Place 1 drop into both eyes daily as needed (dry eyes).   Current Facility-Administered Medications (Ophthalmic Drugs)  Medication Route   aflibercept (EYLEA) SOLN 2 mg Intravitreal   aflibercept (EYLEA) SOLN 2 mg Intravitreal   aflibercept (EYLEA) SOLN 2 mg Intravitreal   Current Outpatient Medications (Other)  Medication Sig   acidophilus (RISAQUAD) CAPS capsule Take 1 capsule by mouth daily.   atorvastatin (LIPITOR) 10 MG tablet Take 10 mg by mouth at bedtime.    Cholecalciferol (VITAMIN D) 50 MCG (2000 UT) tablet Take 4,000 Units by mouth at bedtime.    Cyanocobalamin (B-12 PO) Take 1 Dose by mouth 2 (two) times a week.   fluticasone (FLONASE) 50 MCG/ACT nasal spray Place 1 spray into both nostrils daily as needed for allergies or rhinitis.   hydrochlorothiazide  (HYDRODIURIL) 25 MG tablet Take 25 mg by mouth daily. 1/2 tablet am, 1/2 tablet pm   levothyroxine (SYNTHROID, LEVOTHROID) 100 MCG tablet Take 100 mcg by mouth daily before breakfast.   loratadine (CLARITIN) 10 MG tablet Take 10 mg by mouth daily as needed for allergies.   losartan (COZAAR) 100 MG tablet Take 100 mg by mouth daily.    Melatonin 10 MG CAPS Take 10 mg by mouth at bedtime.   Multiple Vitamins-Minerals (PRESERVISION AREDS 2 PO) Take 1 tablet by mouth at bedtime.    sertraline (ZOLOFT) 50 MG tablet Take 50 mg by mouth daily. (Patient not taking: Reported on 02/14/2023)   sodium chloride (OCEAN) 0.65 % SOLN nasal spray Place 1 spray into both nostrils as needed for congestion.   Current Facility-Administered Medications (Other)  Medication Route   Bevacizumab (AVASTIN) SOLN 1.25 mg Intravitreal   Bevacizumab (AVASTIN) SOLN 1.25 mg Intravitreal   Bevacizumab (AVASTIN) SOLN 1.25 mg Intravitreal   REVIEW OF SYSTEMS: ROS   Positive for: Genitourinary, Musculoskeletal, Endocrine, Eyes, Psychiatric, Allergic/Imm Negative for: Constitutional, Gastrointestinal, Neurological, Skin, HENT, Cardiovascular, Respiratory, Heme/Lymph Last edited by Etheleen Mayhew, COT on 03/28/2023 12:48 PM.     ALLERGIES Allergies  Allergen Reactions   Trilyte [Peg 3350-Kcl-Na Bicarb-Nacl]     HEMATEMESIS   Vicodin [Hydrocodone-Acetaminophen] Nausea And Vomiting   PAST MEDICAL HISTORY Past  Medical History:  Diagnosis Date   Anxiety and depression    Cataract    Hypercholesterolemia    Hypertension    Hypertensive retinopathy    OU   Hypothyroidism    Macular degeneration    OD   Osteoarthritis    PONV (postoperative nausea and vomiting)    Seasonal allergies    Past Surgical History:  Procedure Laterality Date   BIOPSY  10/01/2018   Procedure: BIOPSY;  Surgeon: West Bali, MD;  Location: [facility];  Service: Endoscopy;;  gastric bx & gastric polyp   CATARACT EXTRACTION  W/PHACO Right 12/23/2022   Procedure: CATARACT EXTRACTION PHACO AND INTRAOCULAR LENS PLACEMENT (IOC);  Surgeon: Pecolia Ades, MD;  Location: [facility];  Service: Ophthalmology;  Laterality: Right;  CDE: 11.88   CATARACT EXTRACTION W/PHACO Left 02/24/2023   Procedure: CATARACT EXTRACTION PHACO AND INTRAOCULAR LENS PLACEMENT (IOC);  Surgeon: Pecolia Ades, MD;  Location: [facility];  Service: Ophthalmology;  Laterality: Left;  CDE  5.82   COLONOSCOPY N/A 10/01/2018   Procedure: COLONOSCOPY;  Surgeon: West Bali, MD;  Location: [facility];  Service: Endoscopy;  Laterality: N/A;  10:30am   ESOPHAGOGASTRODUODENOSCOPY N/A 10/01/2018   Procedure: ESOPHAGOGASTRODUODENOSCOPY (EGD);  Surgeon: West Bali, MD;  Location: [facility];  Service: Endoscopy;  Laterality: N/A;   POLYPECTOMY  10/01/2018   Procedure: POLYPECTOMY;  Surgeon: West Bali, MD;  Location: [facility];  Service: Endoscopy;;  colon    TUBAL LIGATION     UTERINE FIBROID SURGERY     FAMILY HISTORY Family History  Problem Relation Age of Onset   Cancer Mother        lung   Stroke Father    Stroke Brother    Colon cancer Neg Hx    SOCIAL HISTORY Social History   Tobacco Use   Smoking status: Former    Current packs/day: 0.00    Average packs/day: 0.3 packs/day for 4.0 years (1.0 ttl pk-yrs)    Types: Cigarettes    Start date: 11/04/1960    Quit date: 11/04/1964    Years since quitting: 58.4   Smokeless tobacco: Never  Vaping Use   Vaping status: Never Used  Substance Use Topics   Alcohol use: No   Drug use: No       OPHTHALMIC EXAM:  Base Eye Exam     Visual Acuity (Snellen - Linear)       Right Left   Dist [place] 20/25 -2 20/20 -1   Dist ph [place] NI          Tonometry (Tonopen, 12:52 PM)       Right Left   Pressure 8 14         Pupils       Pupils Dark Light Shape React APD   Right PERRL 3 2 Round Brisk None   Left PERRL 3 2 Round Brisk None         Visual Fields       Left Right     Full Full         Extraocular Movement       Right Left    Full, Ortho Full, Ortho         Neuro/Psych     Oriented x3: Yes   Mood/Affect: Normal         Dilation     Right eye: 2.5% Phenylephrine @ 12:52 PM  Slit Lamp and Fundus Exam     Slit Lamp Exam       Right Left   Lids/Lashes Dermatochalasis - upper lid Dermatochalasis - upper lid   Conjunctiva/Sclera White and quiet White and quiet   Cornea Arcus, well healed cataract wound Arcus, early nasal band K, 1+PEE   Anterior Chamber Deep, 1-2+ fine cell / pigment moderate depth, with narrow angle temporally   Iris Round and well dilated Round and well dilated   Lens PC IOL in good position PC IOL in good postition   Anterior Vitreous Vitreous syneresis, Posterior vitreous detachment, Weiss ring Vitreous syneresis         Fundus Exam       Right Left   Disc Pink and Sharp, Compact Pink and Sharp, Tilted disc   C/D Ratio 0.2 0.3   Macula Flat, good foveal reflex; central SRF - slightly increased; +drusen; +RPE mottling and clumping, small cluster of drusen and RPE mottling inferior fovea, no heme Flat, good foveal reflex, Retinal pigment epithelial mottling, superior para-foveal focal area of RPE atrophy -- stable, rare drusen, No heme or edema   Vessels attenuated, Tortuous, mild AV crossing changes Vascular attenuation, Tortuous   Periphery Attached, peripheral drusen / RPE changes nasally, peripheral cystoid degeneration Attached, very mild lattice at 0430, mild patch of Lattice degeneration at 0600 -- stable, scattered peripheral drusen, peripheral cystoid degeneration           IMAGING AND PROCEDURES  Imaging and Procedures for 12/11/17  OCT, Retina - OU - Both Eyes       Right Eye Quality was good. Central Foveal Thickness: 382. Progression has worsened. Findings include normal foveal contour, no IRF, retinal drusen , pigment epithelial detachment, subretinal fluid, outer retinal  atrophy (Mild Interval increase in central SRF).   Left Eye Quality was good. Central Foveal Thickness: 277. Progression has been stable. Findings include normal foveal contour, no IRF, no SRF, retinal drusen .   Notes *Images captured and stored on drive  Diagnosis / Impression:  OD: NFP; Mild Interval increase in central SRF OS: NFP, No IRF/SRF  Clinical management:  See below  Abbreviations: NFP - Normal foveal profile. CME - cystoid macular edema. PED - pigment epithelial detachment. IRF - intraretinal fluid. SRF - subretinal fluid. EZ - ellipsoid zone. ERM - epiretinal membrane. ORA - outer retinal atrophy. ORT - outer retinal tubulation. SRHM - subretinal hyper-reflective material       Intravitreal Injection, Pharmacologic Agent - OD - Right Eye       Time Out 03/28/2023. 12:57 PM. Confirmed correct patient, procedure, site, and patient consented.   Anesthesia Topical anesthesia was used. Anesthetic medications included Lidocaine 2%, Proparacaine 0.5%.   Procedure Preparation included 5% betadine to ocular surface, eyelid speculum. A (32g) needle was used.   Injection: 2 mg aflibercept 2 MG/0.05ML   Route: Intravitreal, Site: Right Eye   NDC: [id number], Lot: [id number], Expiration date: 04/28/2024, Waste: 0 mL   Post-op Post injection exam found visual acuity of at least counting fingers. The patient tolerated the procedure well. There were no complications. The patient received written and verbal post procedure care education. Post injection medications were not given.             ASSESSMENT/PLAN:    ICD-10-CM   1. Exudative age-related macular degeneration of right eye with active choroidal neovascularization (HCC)  H35.3211 OCT, Retina - OU - Both Eyes    Intravitreal Injection, Pharmacologic Agent - OD -  Right Eye    aflibercept (EYLEA) SOLN 2 mg    2. 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 Essential hypertension  I10     4.  Hypertensive retinopathy of both eyes  H35.033     5. 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 Posterior vitreous detachment of right eye  H43.811     7. Pseudophakia  Z96.1      1,2. Exudative age related macular degeneration vs CSCR OD  - FA (03.02.19) without leakage / pooling into area of SRF - review of systems reveals significant stressors in life, but no exogenous steroid use - S/P IVA #1 OD (09.27.19),  #2 (10.25.19), #3 (11.22.19) -- minimal response -- IVA resistance  - pt approved for [employer] and IVE  ================================================================= - S/P IVE OD #1 (12.20.19), #2 (01.20.20), #3 (02.18.20), #4 (07.20.20) #5 (09.01.20), #6 (10.13.20), #7 (11.20.20), #8 (01.20.21), #9 (02.17.21), #10 (03.19.21), #11 (04.20.21), #12 (05.25.21), #13 (6.23.21), #14 (07.27.21), #15 (08.30.21), #16 (9.28.21), #17 (10.26.21), #18 (11.29.21), #19 (01.11.22), #20 (02.08.22), #21 (3.8.22), #22 (04.05.22), #23 (5.10.22), #24 (06.21.22), #25 (8.2.22), #26 (9.20.22), #27 (11.15.22), #28 (01.17.23), #29 (03.29.23), #30 (06.21.23), #31 (09.27.23), #32 (01.16.24), #33 (05.07.24), #34 (06.18.24)  - lost to f/u from Feb 2020 to July 2020 due to COVID-19 concerns - today, [facility] 20/25 OD -- stably improved post cataract surgery despite recurrent SRF  - OCT today Mild Interval increase in central SRF at 6 weeks  - review of OCTs shows last time she had SRF was Jan 2022 **discussed decreased efficacy / resistance to [facility] and potential benefit of switching medication**    - will check [employer] auth for next visit.  - recommend IVE OD #35 today, 07.30.24 -- with follow up back to 5 wks  - pt wishes to proceed with IVE OD  - RBA of procedure discussed, questions answered  - informed consent obtained   - Eylea informed consent form signed and scanned on 01.17.23  - see procedure note  - [email or web address] benefits investigation initiated -- approved for 2024  - f/u 5 weeks --  DFE/OCT/ possible injection  3,4. Hypertensive retinopathy OU  - discussed importance of tight BP control  - stable  - monitor  5. Lattice degeneration OS  - patch of inferior lattice OS -- no RT, holes or SRF  - asymptomatic   - discussed findings, prognosis, and treatment options including observation  - monitor  6. PVD / vitreous syneresis OD-   - Discussed findings and prognosis  - No RT or RD on 360 peripheral exam  - Reviewed s/s of RT/RD  - Strict return precautions for any such RT/RD signs/symptoms  7. Pseudophakia OU  - s/p CE/IOL OD (Dr. Ilsa Iha, 04.26.24), OS 02/24/23  - IOL in good position, doing well  - monitor   Ophthalmic Meds Ordered this visit:  Meds ordered this encounter  Medications   aflibercept (EYLEA) SOLN 2 mg     Return in about 5 weeks (around 05/02/2023) for f/u Ex. AMD OD, DFE, OCT, Possible, IVE, OD.  There are no Patient Instructions on file for this visit.   This document serves as a record of services personally performed by Karie Chimera, MD, PhD. It was created on their behalf by Gerilyn Nestle, COT an ophthalmic technician. The creation of this record is the provider's dictation and/or activities during the visit.    Electronically signed by:  Charlette Caffey, COT  03/28/23 2:59 PM  Karie Chimera, M.D., Ph.D. Diseases & Surgery of the Retina and Vitreous [facility]  I have reviewed the above documentation for accuracy and completeness, and I agree with the above. Karie Chimera, M.D., Ph.D. 03/28/23 3:00 PM   Abbreviations: M myopia (nearsighted); A astigmatism; H hyperopia (farsighted); P presbyopia; Mrx spectacle prescription;  CTL contact lenses; OD right eye; OS left eye; OU both eyes  XT exotropia; ET esotropia; PEK punctate epithelial keratitis; PEE punctate epithelial erosions; DES dry eye syndrome; MGD meibomian gland dysfunction; ATs artificial tears; PFAT's preservative free artificial tears;  NSC nuclear sclerotic cataract; PSC posterior subcapsular cataract; ERM epi-retinal membrane; PVD posterior vitreous detachment; RD retinal detachment; DM diabetes mellitus; DR diabetic retinopathy; NPDR non-proliferative diabetic retinopathy; PDR proliferative diabetic retinopathy; CSME clinically significant macular edema; DME diabetic macular edema; dbh dot blot hemorrhages; CWS cotton wool spot; POAG primary open angle glaucoma; C/D cup-to-disc ratio; HVF humphrey visual field; GVF goldmann visual field; OCT optical coherence tomography; IOP intraocular pressure; BRVO Branch retinal vein occlusion; CRVO central retinal vein occlusion; CRAO central retinal artery occlusion; BRAO branch retinal artery occlusion; RT retinal tear; SB scleral buckle; PPV pars plana vitrectomy; VH Vitreous hemorrhage; PRP panretinal laser photocoagulation; IVK intravitreal kenalog; VMT vitreomacular traction; MH Macular hole;  NVD neovascularization of the disc; NVE neovascularization elsewhere; AREDS age related eye disease study; ARMD age related macular degeneration; POAG primary open angle glaucoma; EBMD epithelial/anterior basement membrane dystrophy; ACIOL anterior chamber intraocular lens; IOL intraocular lens; PCIOL posterior chamber intraocular lens; Phaco/IOL phacoemulsification with intraocular lens placement; PRK photorefractive keratectomy; LASIK laser assisted in situ keratomileusis; HTN hypertension; DM diabetes mellitus; COPD chronic obstructive pulmonary disease

## 2023-03-28 ENCOUNTER — Encounter (INDEPENDENT_AMBULATORY_CARE_PROVIDER_SITE_OTHER): Payer: Self-pay | Admitting: Ophthalmology

## 2023-03-28 ENCOUNTER — Ambulatory Visit (INDEPENDENT_AMBULATORY_CARE_PROVIDER_SITE_OTHER): Payer: Medicare HMO | Admitting: Ophthalmology

## 2023-03-28 DIAGNOSIS — H35033 Hypertensive retinopathy, bilateral: Secondary | ICD-10-CM

## 2023-03-28 DIAGNOSIS — H353211 Exudative age-related macular degeneration, right eye, with active choroidal neovascularization: Secondary | ICD-10-CM

## 2023-03-28 DIAGNOSIS — H43811 Vitreous degeneration, right eye: Secondary | ICD-10-CM

## 2023-03-28 DIAGNOSIS — H35412 Lattice degeneration of retina, left eye: Secondary | ICD-10-CM

## 2023-03-28 DIAGNOSIS — H25812 Combined forms of age-related cataract, left eye: Secondary | ICD-10-CM

## 2023-03-28 DIAGNOSIS — H35711 Central serous chorioretinopathy, right eye: Secondary | ICD-10-CM | POA: Diagnosis not present

## 2023-03-28 DIAGNOSIS — Z961 Presence of intraocular lens: Secondary | ICD-10-CM

## 2023-03-28 DIAGNOSIS — I10 Essential (primary) hypertension: Secondary | ICD-10-CM | POA: Diagnosis not present

## 2023-03-28 MED ORDER — AFLIBERCEPT 2MG/0.05ML IZ SOLN FOR KALEIDOSCOPE
2.0000 mg | INTRAVITREAL | Status: AC | PRN
Start: 2023-03-28 — End: 2023-03-28
  Administered 2023-03-28: 2 mg via INTRAVITREAL

## 2023-04-18 NOTE — Progress Notes (Signed)
[facility] - Clinic Note  05/02/2023     CHIEF COMPLAINT Patient presents for Retina Follow Up  HISTORY OF PRESENT ILLNESS: Darlene Crawford is a 82 y.o. female who presents to the clinic today for:   HPI     Retina Follow Up   Patient presents with  Other.  This started months ago.  Duration of 5 weeks.  Since onset it is stable.  I, the attending physician,  performed the HPI with the patient and updated documentation appropriately.        Comments   Patient feels the vision is the same. She is using AT's OU PRN.       Last edited by Rennis Chris, MD on 05/02/2023  2:20 PM.      Referring physician: Wendall Papa [address] [place],  [place] [address]  HISTORICAL INFORMATION:  Selected notes from the MEDICAL RECORD NUMBER Referred by Dr. Monico Blitz for concern of ARMD OD with progression to CNVM vs CSR   CURRENT MEDICATIONS: Current Outpatient Medications (Ophthalmic Drugs)  Medication Sig   ARTIFICIAL TEAR SOLUTION OP Place 1 drop into both eyes daily as needed (dry eyes).   Current Facility-Administered Medications (Ophthalmic Drugs)  Medication Route   aflibercept (EYLEA) SOLN 2 mg Intravitreal   aflibercept (EYLEA) SOLN 2 mg Intravitreal   aflibercept (EYLEA) SOLN 2 mg Intravitreal   Current Outpatient Medications (Other)  Medication Sig   acidophilus (RISAQUAD) CAPS capsule Take 1 capsule by mouth daily.   atorvastatin (LIPITOR) 10 MG tablet Take 10 mg by mouth at bedtime.    Cholecalciferol (VITAMIN D) 50 MCG (2000 UT) tablet Take 4,000 Units by mouth at bedtime.    Cyanocobalamin (B-12 PO) Take 1 Dose by mouth 2 (two) times a week.   fluticasone (FLONASE) 50 MCG/ACT nasal spray Place 1 spray into both nostrils daily as needed for allergies or rhinitis.   hydrochlorothiazide (HYDRODIURIL) 25 MG tablet Take 25 mg by mouth daily. 1/2 tablet am, 1/2 tablet pm   levothyroxine (SYNTHROID, LEVOTHROID) 100 MCG tablet Take 100 mcg by mouth  daily before breakfast.   loratadine (CLARITIN) 10 MG tablet Take 10 mg by mouth daily as needed for allergies.   losartan (COZAAR) 100 MG tablet Take 100 mg by mouth daily.    Melatonin 10 MG CAPS Take 10 mg by mouth at bedtime.   Multiple Vitamins-Minerals (PRESERVISION AREDS 2 PO) Take 1 tablet by mouth at bedtime.    sertraline (ZOLOFT) 50 MG tablet Take 50 mg by mouth daily.   sodium chloride (OCEAN) 0.65 % SOLN nasal spray Place 1 spray into both nostrils as needed for congestion.   Current Facility-Administered Medications (Other)  Medication Route   Bevacizumab (AVASTIN) SOLN 1.25 mg Intravitreal   Bevacizumab (AVASTIN) SOLN 1.25 mg Intravitreal   Bevacizumab (AVASTIN) SOLN 1.25 mg Intravitreal   REVIEW OF SYSTEMS: ROS   Positive for: Genitourinary, Musculoskeletal, Endocrine, Eyes, Psychiatric, Allergic/Imm Negative for: Constitutional, Gastrointestinal, Neurological, Skin, HENT, Cardiovascular, Respiratory, Heme/Lymph Last edited by Charlette Caffey, COT on 05/02/2023  1:30 PM.      ALLERGIES Allergies  Allergen Reactions   Trilyte [Peg 3350-Kcl-Na Bicarb-Nacl]     HEMATEMESIS   Vicodin [Hydrocodone-Acetaminophen] Nausea And Vomiting   PAST MEDICAL HISTORY Past Medical History:  Diagnosis Date   Anxiety and depression    Cataract    Hypercholesterolemia    Hypertension    Hypertensive retinopathy    OU   Hypothyroidism  Macular degeneration    OD   Osteoarthritis    PONV (postoperative nausea and vomiting)    Seasonal allergies    Past Surgical History:  Procedure Laterality Date   BIOPSY  10/01/2018   Procedure: BIOPSY;  Surgeon: West Bali, MD;  Location: [facility];  Service: Endoscopy;;  gastric bx & gastric polyp   CATARACT EXTRACTION W/PHACO Right 12/23/2022   Procedure: CATARACT EXTRACTION PHACO AND INTRAOCULAR LENS PLACEMENT (IOC);  Surgeon: Pecolia Ades, MD;  Location: [facility];  Service: Ophthalmology;  Laterality: Right;  CDE: 11.88    CATARACT EXTRACTION W/PHACO Left 02/24/2023   Procedure: CATARACT EXTRACTION PHACO AND INTRAOCULAR LENS PLACEMENT (IOC);  Surgeon: Pecolia Ades, MD;  Location: [facility];  Service: Ophthalmology;  Laterality: Left;  CDE  5.82   COLONOSCOPY N/A 10/01/2018   Procedure: COLONOSCOPY;  Surgeon: West Bali, MD;  Location: [facility];  Service: Endoscopy;  Laterality: N/A;  10:30am   ESOPHAGOGASTRODUODENOSCOPY N/A 10/01/2018   Procedure: ESOPHAGOGASTRODUODENOSCOPY (EGD);  Surgeon: West Bali, MD;  Location: [facility];  Service: Endoscopy;  Laterality: N/A;   POLYPECTOMY  10/01/2018   Procedure: POLYPECTOMY;  Surgeon: West Bali, MD;  Location: [facility];  Service: Endoscopy;;  colon    TUBAL LIGATION     UTERINE FIBROID SURGERY     FAMILY HISTORY Family History  Problem Relation Age of Onset   Cancer Mother        lung   Stroke Father    Stroke Brother    Colon cancer Neg Hx    SOCIAL HISTORY Social History   Tobacco Use   Smoking status: Former    Current packs/day: 0.00    Average packs/day: 0.3 packs/day for 4.0 years (1.0 ttl pk-yrs)    Types: Cigarettes    Start date: 11/04/1960    Quit date: 11/04/1964    Years since quitting: 58.5   Smokeless tobacco: Never  Vaping Use   Vaping status: Never Used  Substance Use Topics   Alcohol use: No   Drug use: No       OPHTHALMIC EXAM:  Base Eye Exam     Visual Acuity (Snellen - Linear)       Right Left   Dist [place] 20/25 20/20   Dist ph [place] NI          Tonometry (Tonopen, 1:33 PM)       Right Left   Pressure 12 15         Pupils       Dark Light Shape React APD   Right 3 2 Round Brisk None   Left 3 2 Round Brisk None         Visual Fields       Left Right    Full Full         Extraocular Movement       Right Left    Full, Ortho Full, Ortho         Neuro/Psych     Oriented x3: Yes   Mood/Affect: Normal         Dilation     Right eye: 1.0% Mydriacyl @ 1:31 PM            Slit Lamp and Fundus Exam     Slit Lamp Exam       Right Left   Lids/Lashes Dermatochalasis - upper lid Dermatochalasis - upper lid   Conjunctiva/Sclera White and quiet White and quiet  Cornea Arcus, well healed cataract wound Arcus, early nasal band K, 1+PEE   Anterior Chamber Deep, 1-2+ fine cell / pigment moderate depth, with narrow angle temporally   Iris Round and well dilated Round and well dilated   Lens PC IOL in good position PC IOL in good postition   Anterior Vitreous Vitreous syneresis, Posterior vitreous detachment, Weiss ring Vitreous syneresis         Fundus Exam       Right Left   Disc Pink and Sharp, Compact Pink and Sharp, Tilted disc   C/D Ratio 0.2 0.3   Macula Flat, good foveal reflex; central SRF - slightly improved; +drusen; +RPE mottling and clumping, small cluster of drusen and RPE mottling inferior fovea, no heme Flat, good foveal reflex, Retinal pigment epithelial mottling, superior para-foveal focal area of RPE atrophy -- stable, rare drusen, No heme or edema   Vessels attenuated, Tortuous Vascular attenuation, Tortuous   Periphery Attached, peripheral drusen / RPE changes nasally, peripheral cystoid degeneration Attached, very mild lattice at 0430, mild patch of Lattice degeneration at 0600 -- stable, scattered peripheral drusen, peripheral cystoid degeneration           Refraction     Wearing Rx       Sphere Cylinder Axis Add   Right +2.00 +2.00 160 +2.50   Left +2.50 +2.00 020 +2.50           IMAGING AND PROCEDURES  Imaging and Procedures for 12/11/17  OCT, Retina - OU - Both Eyes       Right Eye Quality was good. Central Foveal Thickness: 336. Progression has improved. Findings include normal foveal contour, no IRF, retinal drusen , pigment epithelial detachment, subretinal fluid, outer retinal atrophy (interval improvement in central SRF).   Left Eye Quality was good. Central Foveal Thickness: 274. Progression has been stable.  Findings include normal foveal contour, no IRF, no SRF, retinal drusen .   Notes *Images captured and stored on drive  Diagnosis / Impression:  OD: NFP; Interval improvement in central SRF OS: NFP, No IRF/SRF  Clinical management:  See below  Abbreviations: NFP - Normal foveal profile. CME - cystoid macular edema. PED - pigment epithelial detachment. IRF - intraretinal fluid. SRF - subretinal fluid. EZ - ellipsoid zone. ERM - epiretinal membrane. ORA - outer retinal atrophy. ORT - outer retinal tubulation. SRHM - subretinal hyper-reflective material       Intravitreal Injection, Pharmacologic Agent - OD - Right Eye       Time Out 05/02/2023. 2:07 PM. Confirmed correct patient, procedure, site, and patient consented.   Anesthesia Topical anesthesia was used. Anesthetic medications included Lidocaine 2%, Proparacaine 0.5%.   Procedure Preparation included 5% betadine to ocular surface, eyelid speculum. A (32g) needle was used.   Injection: 6 mg faricimab-svoa 6 MG/0.05ML   Route: Intravitreal, Site: Right Eye   NDC: O8010301, Lot: [id number], Expiration date: 04/28/2025, Waste: 0 mL   Post-op Post injection exam found visual acuity of at least counting fingers. The patient tolerated the procedure well. There were no complications. The patient received written and verbal post procedure care education. Post injection medications were not given.            ASSESSMENT/PLAN:    ICD-10-CM   1. Exudative age-related macular degeneration of right eye with active choroidal neovascularization (HCC)  H35.3211 OCT, Retina - OU - Both Eyes    Intravitreal Injection, Pharmacologic Agent - OD - Right Eye    faricimab-svoa (VABYSMO) 6mg /0.51mL  intravitreal injection    2. 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 Essential hypertension  I10     4. Hypertensive retinopathy of both eyes  H35.033     5. 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  Posterior vitreous detachment of right eye  H43.811     7. Pseudophakia  Z96.1       1,2. Exudative age related macular degeneration vs CSCR OD  - FA (03.02.19) without leakage / pooling into area of SRF - review of systems reveals significant stressors in life, but no exogenous steroid use - S/P IVA #1 OD (09.27.19),  #2 (10.25.19), #3 (11.22.19) -- minimal response -- IVA resistance  - pt approved for [employer] and IVE  ================================================================= - S/P IVE OD #1 (12.20.19), #2 (01.20.20), #3 (02.18.20), #4 (07.20.20) #5 (09.01.20), #6 (10.13.20), #7 (11.20.20), #8 (01.20.21), #9 (02.17.21), #10 (03.19.21), #11 (04.20.21), #12 (05.25.21), #13 (6.23.21), #14 (07.27.21), #15 (08.30.21), #16 (9.28.21), #17 (10.26.21), #18 (11.29.21), #19 (01.11.22), #20 (02.08.22), #21 (3.8.22), #22 (04.05.22), #23 (5.10.22), #24 (06.21.22), #25 (8.2.22), #26 (9.20.22), #27 (11.15.22), #28 (01.17.23), #29 (03.29.23), #30 (06.21.23), #31 (09.27.23), #32 (01.16.24), #33 (05.07.24), #34 (06.18.24), #35 (07.30.24)  - lost to f/u from Feb 2020 to July 2020 due to COVID-19 concerns - today, [facility] 20/25 OD -- stably improved post cataract surgery despite recurrent SRF  - OCT today Interval improvement in central SRF at 5 weeks  **discussed decreased efficacy / resistance to Eylea and potential benefit of switching medication**   - recommend IVV OD #1 today, 09.03.24 -- with follow up in 5 wks  - pt wishes to proceed with IVV OD  - RBA of procedure discussed, questions answered  - informed consent obtained   - Vabysmo informed consent form signed and scanned on 09.03.24  - see procedure note  - [email or web address] benefits investigation initiated -- approved for 2024  - Vabysmo approved for 2024  - f/u 5 weeks -- DFE/OCT/ possible injection  3,4. Hypertensive retinopathy OU  - discussed importance of tight BP control  - stable  - monitor  5. Lattice degeneration OS  - patch of inferior  lattice OS -- no RT, holes or SRF  - asymptomatic   - discussed findings, prognosis, and treatment options including observation  - monitor  6. PVD / vitreous syneresis OD-   - Discussed findings and prognosis  - No RT or RD on 360 peripheral exam  - Reviewed s/s of RT/RD  - Strict return precautions for any such RT/RD signs/symptoms  7. Pseudophakia OU  - s/p CE/IOL OD (Dr. Ilsa Iha, 04.26.24), OS 02/24/23  - IOL in good position, doing well  - monitor   Ophthalmic Meds Ordered this visit:  Meds ordered this encounter  Medications   faricimab-svoa (VABYSMO) 6mg /0.68mL intravitreal injection     Return in about 5 weeks (around 06/06/2023) for f/u exu ARMD OD, DFE, OCT.  There are no Patient Instructions on file for this visit.   This document serves as a record of services personally performed by Karie Chimera, MD, PhD. It was created on their behalf by Laurey Morale, COT an ophthalmic technician. The creation of this record is the provider's dictation and/or activities during the visit.    Electronically signed by:  Charlette Caffey, COT  05/02/23 10:20 PM  This document serves as a record of services personally performed by Karie Chimera, MD, PhD. It was created on their behalf by  Glee Arvin. Manson Passey, OA an ophthalmic technician. The creation of this record is the provider's dictation and/or activities during the visit.    Electronically signed by: Glee Arvin. Manson Passey, OA 05/02/23 10:20 PM  Karie Chimera, M.D., Ph.D. Diseases & Surgery of the Retina and Vitreous [facility]  I have reviewed the above documentation for accuracy and completeness, and I agree with the above. Karie Chimera, M.D., Ph.D. 05/02/23 10:22 PM  Abbreviations: M myopia (nearsighted); A astigmatism; H hyperopia (farsighted); P presbyopia; Mrx spectacle prescription;  CTL contact lenses; OD right eye; OS left eye; OU both eyes  XT exotropia; ET esotropia; PEK punctate epithelial  keratitis; PEE punctate epithelial erosions; DES dry eye syndrome; MGD meibomian gland dysfunction; ATs artificial tears; PFAT's preservative free artificial tears; NSC nuclear sclerotic cataract; PSC posterior subcapsular cataract; ERM epi-retinal membrane; PVD posterior vitreous detachment; RD retinal detachment; DM diabetes mellitus; DR diabetic retinopathy; NPDR non-proliferative diabetic retinopathy; PDR proliferative diabetic retinopathy; CSME clinically significant macular edema; DME diabetic macular edema; dbh dot blot hemorrhages; CWS cotton wool spot; POAG primary open angle glaucoma; C/D cup-to-disc ratio; HVF humphrey visual field; GVF goldmann visual field; OCT optical coherence tomography; IOP intraocular pressure; BRVO Branch retinal vein occlusion; CRVO central retinal vein occlusion; CRAO central retinal artery occlusion; BRAO branch retinal artery occlusion; RT retinal tear; SB scleral buckle; PPV pars plana vitrectomy; VH Vitreous hemorrhage; PRP panretinal laser photocoagulation; IVK intravitreal kenalog; VMT vitreomacular traction; MH Macular hole;  NVD neovascularization of the disc; NVE neovascularization elsewhere; AREDS age related eye disease study; ARMD age related macular degeneration; POAG primary open angle glaucoma; EBMD epithelial/anterior basement membrane dystrophy; ACIOL anterior chamber intraocular lens; IOL intraocular lens; PCIOL posterior chamber intraocular lens; Phaco/IOL phacoemulsification with intraocular lens placement; PRK photorefractive keratectomy; LASIK laser assisted in situ keratomileusis; HTN hypertension; DM diabetes mellitus; COPD chronic obstructive pulmonary disease

## 2023-04-27 DIAGNOSIS — R5383 Other fatigue: Secondary | ICD-10-CM | POA: Diagnosis not present

## 2023-04-27 DIAGNOSIS — E7800 Pure hypercholesterolemia, unspecified: Secondary | ICD-10-CM | POA: Diagnosis not present

## 2023-04-27 DIAGNOSIS — Z299 Encounter for prophylactic measures, unspecified: Secondary | ICD-10-CM | POA: Diagnosis not present

## 2023-04-27 DIAGNOSIS — M8580 Other specified disorders of bone density and structure, unspecified site: Secondary | ICD-10-CM | POA: Diagnosis not present

## 2023-04-27 DIAGNOSIS — Z0000 Encounter for general adult medical examination without abnormal findings: Secondary | ICD-10-CM | POA: Diagnosis not present

## 2023-04-27 DIAGNOSIS — Z79899 Other long term (current) drug therapy: Secondary | ICD-10-CM | POA: Diagnosis not present

## 2023-04-27 DIAGNOSIS — I10 Essential (primary) hypertension: Secondary | ICD-10-CM | POA: Diagnosis not present

## 2023-05-02 ENCOUNTER — Encounter (INDEPENDENT_AMBULATORY_CARE_PROVIDER_SITE_OTHER): Payer: Self-pay | Admitting: Ophthalmology

## 2023-05-02 ENCOUNTER — Ambulatory Visit (INDEPENDENT_AMBULATORY_CARE_PROVIDER_SITE_OTHER): Payer: Medicare HMO | Admitting: Ophthalmology

## 2023-05-02 DIAGNOSIS — H35033 Hypertensive retinopathy, bilateral: Secondary | ICD-10-CM

## 2023-05-02 DIAGNOSIS — H353211 Exudative age-related macular degeneration, right eye, with active choroidal neovascularization: Secondary | ICD-10-CM | POA: Diagnosis not present

## 2023-05-02 DIAGNOSIS — H35711 Central serous chorioretinopathy, right eye: Secondary | ICD-10-CM

## 2023-05-02 DIAGNOSIS — H43811 Vitreous degeneration, right eye: Secondary | ICD-10-CM | POA: Diagnosis not present

## 2023-05-02 DIAGNOSIS — I10 Essential (primary) hypertension: Secondary | ICD-10-CM

## 2023-05-02 DIAGNOSIS — Z961 Presence of intraocular lens: Secondary | ICD-10-CM

## 2023-05-02 DIAGNOSIS — H35412 Lattice degeneration of retina, left eye: Secondary | ICD-10-CM | POA: Diagnosis not present

## 2023-05-02 MED ORDER — FARICIMAB-SVOA 6 MG/0.05ML IZ SOLN
6.0000 mg | INTRAVITREAL | Status: AC | PRN
Start: 2023-05-02 — End: 2023-05-02
  Administered 2023-05-02: 6 mg via INTRAVITREAL

## 2023-06-01 NOTE — Progress Notes (Signed)
[facility] - Clinic Note  06/07/2023     CHIEF COMPLAINT Patient presents for Retina Follow Up  HISTORY OF PRESENT ILLNESS: Darlene Crawford is a 82 y.o. female who presents to the clinic today for:   HPI     Retina Follow Up   Patient presents with  Wet AMD.  In both eyes.  This started 5 weeks ago.  Duration of 5 weeks.  Since onset it is stable.  I, the attending physician,  performed the HPI with the patient and updated documentation appropriately.        Comments   5 week retina follow up ARMD and IVV OD pt is reporting no vision changes noticed she has floaters but denies any flashes of light       Last edited by Rennis Chris, MD on 06/07/2023 11:29 PM.     Referring physician: Wendall Papa [address] [place],  [place] [address]  HISTORICAL INFORMATION:  Selected notes from the MEDICAL RECORD NUMBER Referred by Dr. Monico Blitz for concern of ARMD OD with progression to CNVM vs CSR   CURRENT MEDICATIONS: Current Outpatient Medications (Ophthalmic Drugs)  Medication Sig   ARTIFICIAL TEAR SOLUTION OP Place 1 drop into both eyes daily as needed (dry eyes).   Current Facility-Administered Medications (Ophthalmic Drugs)  Medication Route   aflibercept (EYLEA) SOLN 2 mg Intravitreal   aflibercept (EYLEA) SOLN 2 mg Intravitreal   aflibercept (EYLEA) SOLN 2 mg Intravitreal   Current Outpatient Medications (Other)  Medication Sig   acidophilus (RISAQUAD) CAPS capsule Take 1 capsule by mouth daily.   atorvastatin (LIPITOR) 10 MG tablet Take 10 mg by mouth at bedtime.    Cholecalciferol (VITAMIN D) 50 MCG (2000 UT) tablet Take 4,000 Units by mouth at bedtime.    Cyanocobalamin (B-12 PO) Take 1 Dose by mouth 2 (two) times a week.   fluticasone (FLONASE) 50 MCG/ACT nasal spray Place 1 spray into both nostrils daily as needed for allergies or rhinitis.   hydrochlorothiazide (HYDRODIURIL) 25 MG tablet Take 25 mg by mouth daily. 1/2 tablet am, 1/2 tablet  pm   levothyroxine (SYNTHROID, LEVOTHROID) 100 MCG tablet Take 100 mcg by mouth daily before breakfast.   loratadine (CLARITIN) 10 MG tablet Take 10 mg by mouth daily as needed for allergies.   losartan (COZAAR) 100 MG tablet Take 100 mg by mouth daily.    Melatonin 10 MG CAPS Take 10 mg by mouth at bedtime.   Multiple Vitamins-Minerals (PRESERVISION AREDS 2 PO) Take 1 tablet by mouth at bedtime.    sertraline (ZOLOFT) 50 MG tablet Take 50 mg by mouth daily.   sodium chloride (OCEAN) 0.65 % SOLN nasal spray Place 1 spray into both nostrils as needed for congestion.   Current Facility-Administered Medications (Other)  Medication Route   Bevacizumab (AVASTIN) SOLN 1.25 mg Intravitreal   Bevacizumab (AVASTIN) SOLN 1.25 mg Intravitreal   Bevacizumab (AVASTIN) SOLN 1.25 mg Intravitreal   REVIEW OF SYSTEMS: ROS   Positive for: Genitourinary, Musculoskeletal, Endocrine, Eyes, Psychiatric, Allergic/Imm Negative for: Constitutional, Gastrointestinal, Neurological, Skin, HENT, Cardiovascular, Respiratory, Heme/Lymph Last edited by Etheleen Mayhew, COT on 06/07/2023 12:26 PM.       ALLERGIES Allergies  Allergen Reactions   Trilyte [Peg 3350-Kcl-Na Bicarb-Nacl]     HEMATEMESIS   Vicodin [Hydrocodone-Acetaminophen] Nausea And Vomiting   PAST MEDICAL HISTORY Past Medical History:  Diagnosis Date   Anxiety and depression    Cataract    Hypercholesterolemia  Hypertension    Hypertensive retinopathy    OU   Hypothyroidism    Macular degeneration    OD   Osteoarthritis    PONV (postoperative nausea and vomiting)    Seasonal allergies    Past Surgical History:  Procedure Laterality Date   BIOPSY  10/01/2018   Procedure: BIOPSY;  Surgeon: West Bali, MD;  Location: [facility];  Service: Endoscopy;;  gastric bx & gastric polyp   CATARACT EXTRACTION W/PHACO Right 12/23/2022   Procedure: CATARACT EXTRACTION PHACO AND INTRAOCULAR LENS PLACEMENT (IOC);  Surgeon: Pecolia Ades, MD;  Location: [facility];  Service: Ophthalmology;  Laterality: Right;  CDE: 11.88   CATARACT EXTRACTION W/PHACO Left 02/24/2023   Procedure: CATARACT EXTRACTION PHACO AND INTRAOCULAR LENS PLACEMENT (IOC);  Surgeon: Pecolia Ades, MD;  Location: [facility];  Service: Ophthalmology;  Laterality: Left;  CDE  5.82   COLONOSCOPY N/A 10/01/2018   Procedure: COLONOSCOPY;  Surgeon: West Bali, MD;  Location: [facility];  Service: Endoscopy;  Laterality: N/A;  10:30am   ESOPHAGOGASTRODUODENOSCOPY N/A 10/01/2018   Procedure: ESOPHAGOGASTRODUODENOSCOPY (EGD);  Surgeon: West Bali, MD;  Location: [facility];  Service: Endoscopy;  Laterality: N/A;   POLYPECTOMY  10/01/2018   Procedure: POLYPECTOMY;  Surgeon: West Bali, MD;  Location: [facility];  Service: Endoscopy;;  colon    TUBAL LIGATION     UTERINE FIBROID SURGERY     FAMILY HISTORY Family History  Problem Relation Age of Onset   Cancer Mother        lung   Stroke Father    Stroke Brother    Colon cancer Neg Hx    SOCIAL HISTORY Social History   Tobacco Use   Smoking status: Former    Current packs/day: 0.00    Average packs/day: 0.3 packs/day for 4.0 years (1.0 ttl pk-yrs)    Types: Cigarettes    Start date: 11/04/1960    Quit date: 11/04/1964    Years since quitting: 58.6   Smokeless tobacco: Never  Vaping Use   Vaping status: Never Used  Substance Use Topics   Alcohol use: No   Drug use: No       OPHTHALMIC EXAM:  Base Eye Exam     Visual Acuity (Snellen - Linear)       Right Left   Dist [place] 20/30 20/25   Dist ph [place] NI 20/20         Tonometry (Tonopen, 12:32 PM)       Right Left   Pressure 11 09         Pupils       Pupils Dark Light Shape React APD   Right PERRL 3 2 Round Brisk None   Left PERRL 3 2 Round Brisk None         Visual Fields       Left Right    Full Full         Extraocular Movement       Right Left    Full, Ortho Full, Ortho         Neuro/Psych      Oriented x3: Yes   Mood/Affect: Normal         Dilation     Right eye: 2.5% Phenylephrine @ 12:32 PM           Slit Lamp and Fundus Exam     Slit Lamp Exam       Right Left   Lids/Lashes  Dermatochalasis - upper lid Dermatochalasis - upper lid   Conjunctiva/Sclera White and quiet White and quiet   Cornea Arcus, well healed cataract wound Arcus, early nasal band K, 1+PEE   Anterior Chamber Deep, 1-2+ fine cell / pigment moderate depth, with narrow angle temporally   Iris Round and well dilated Round and well dilated   Lens PC IOL in good position PC IOL in good postition   Anterior Vitreous Vitreous syneresis, Posterior vitreous detachment, Weiss ring Vitreous syneresis         Fundus Exam       Right Left   Disc Pink and Sharp, Compact Pink and Sharp, Tilted disc   C/D Ratio 0.2 0.3   Macula Flat, good foveal reflex; central SRF - slightly increased; +drusen; +RPE mottling and clumping, small cluster of drusen and RPE mottling inferior fovea, no heme Flat, good foveal reflex, Retinal pigment epithelial mottling, superior para-foveal focal area of RPE atrophy -- stable, rare drusen, No heme or edema   Vessels attenuated, Tortuous Vascular attenuation, Tortuous   Periphery Attached, peripheral drusen / RPE changes nasally, peripheral cystoid degeneration Attached, very mild lattice at 0430, mild patch of Lattice degeneration at 0600 -- stable, scattered peripheral drusen, peripheral cystoid degeneration           IMAGING AND PROCEDURES  Imaging and Procedures for 12/11/17  OCT, Retina - OU - Both Eyes       Right Eye Quality was good. Central Foveal Thickness: 462. Progression has worsened. Findings include normal foveal contour, no IRF, retinal drusen , pigment epithelial detachment, subretinal fluid, outer retinal atrophy (interval increase in central SRF).   Left Eye Quality was good. Central Foveal Thickness: 275. Progression has been stable. Findings  include normal foveal contour, no IRF, no SRF, retinal drusen .   Notes *Images captured and stored on drive  Diagnosis / Impression:  OD: NFP; Interval increase in central SRF OS: NFP, No IRF/SRF  Clinical management:  See below  Abbreviations: NFP - Normal foveal profile. CME - cystoid macular edema. PED - pigment epithelial detachment. IRF - intraretinal fluid. SRF - subretinal fluid. EZ - ellipsoid zone. ERM - epiretinal membrane. ORA - outer retinal atrophy. ORT - outer retinal tubulation. SRHM - subretinal hyper-reflective material       Intravitreal Injection, Pharmacologic Agent - OD - Right Eye       Time Out 06/07/2023. 12:35 PM. Confirmed correct patient, procedure, site, and patient consented.   Anesthesia Topical anesthesia was used. Anesthetic medications included Lidocaine 2%, Proparacaine 0.5%.   Procedure Preparation included 5% betadine to ocular surface, eyelid speculum. A (32g) needle was used.   Injection: 6 mg faricimab-svoa 6 MG/0.05ML   Route: Intravitreal, Site: Right Eye   NDC: [id number], Lot: [id number], Expiration date: 11/26/2024, Waste: 0 mL   Post-op Post injection exam found visual acuity of at least counting fingers. The patient tolerated the procedure well. There were no complications. The patient received written and verbal post procedure care education. Post injection medications were not given.             ASSESSMENT/PLAN:    ICD-10-CM   1. Exudative age-related macular degeneration of right eye with active choroidal neovascularization (HCC)  H35.3211 OCT, Retina - OU - Both Eyes    Intravitreal Injection, Pharmacologic Agent - OD - Right Eye    faricimab-svoa (VABYSMO) 6mg /0.70mL intravitreal injection    2. 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 Essential hypertension  I10     4. Hypertensive retinopathy of both eyes  H35.033     5. 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 Posterior  vitreous detachment of right eye  H43.811     7. Pseudophakia  Z96.1      1,2. Exudative age related macular degeneration vs CSCR OD  - FA (03.02.19) without leakage / pooling into area of SRF - review of systems reveals significant stressors in life, but no exogenous steroid use - S/P IVA #1 OD (09.27.19),  #2 (10.25.19), #3 (11.22.19) -- minimal response -- IVA resistance  - pt approved for [employer] and IVE  ================================================================= - S/P IVE OD #1 (12.20.19), #2 (01.20.20), #3 (02.18.20), #4 (07.20.20) #5 (09.01.20), #6 (10.13.20), #7 (11.20.20), #8 (01.20.21), #9 (02.17.21), #10 (03.19.21), #11 (04.20.21), #12 (05.25.21), #13 (6.23.21), #14 (07.27.21), #15 (08.30.21), #16 (9.28.21), #17 (10.26.21), #18 (11.29.21), #19 (01.11.22), #20 (02.08.22), #21 (3.8.22), #22 (04.05.22), #23 (5.10.22), #24 (06.21.22), #25 (8.2.22), #26 (9.20.22), #27 (11.15.22), #28 (01.17.23), #29 (03.29.23), #30 (06.21.23), #31 (09.27.23), #32 (01.16.24), #33 (05.07.24), #34 (06.18.24), #35 (07.30.24) -- IVE resistance - s/p IVV OD #1 (09.03.24)  - lost to f/u from Feb 2020 to July 2020 due to COVID-19 concerns - today, BCVA OD 20/30 from 20/25  - OCT shows interval increase in central SRF at 5 weeks  - recommend IVV OD #2 today, 10.03.24 -- with follow up back to 4 wks  - pt wishes to proceed with IVV OD  - RBA of procedure discussed, questions answered  - informed consent obtained   - Vabysmo informed consent form signed and scanned on 09.03.24  - see procedure note  - [email or web address] benefits investigation initiated -- approved for 2024  - Vabysmo approved for 2024  - f/u 4 weeks -- DFE/OCT/ possible injection  3,4. Hypertensive retinopathy OU  - discussed importance of tight BP control  - stable  - monitor  5. Lattice degeneration OS  - patch of inferior lattice OS -- no RT, holes or SRF  - asymptomatic   - discussed findings, prognosis, and treatment options including  observation  - monitor  6. PVD / vitreous syneresis OD-   - Discussed findings and prognosis  - No RT or RD on 360 peripheral exam  - Reviewed s/s of RT/RD  - Strict return precautions for any such RT/RD signs/symptoms  7. Pseudophakia OU  - s/p CE/IOL OD (Dr. Ilsa Iha, 04.26.24), OS 02/24/23  - IOL in good position, doing well  - monitor  Ophthalmic Meds Ordered this visit:  Meds ordered this encounter  Medications   faricimab-svoa (VABYSMO) 6mg /0.85mL intravitreal injection     Return in about 4 weeks (around 07/05/2023) for f/u exu ARMD OD, DFE, OCT, Possible Injxn.  There are no Patient Instructions on file for this visit.   This document serves as a record of services personally performed by Karie Chimera, MD, PhD. It was created on their behalf by De Blanch, an ophthalmic technician. The creation of this record is the provider's dictation and/or activities during the visit.    Electronically signed by: De Blanch, OA, 06/07/23  11:30 PM  This document serves as a record of services personally performed by Karie Chimera, MD, PhD. It was created on their behalf by Glee Arvin. Manson Passey, OA an ophthalmic technician. The creation of this record is the provider's dictation and/or activities during the visit.    Electronically signed by: Glee Arvin. Manson Passey, OA 06/07/23 11:30 PM  Isaias Cowman.  Vanessa Barbara, M.D., Ph.D. Diseases & Surgery of the Retina and Vitreous [facility]  I have reviewed the above documentation for accuracy and completeness, and I agree with the above. Karie Chimera, M.D., Ph.D. 06/07/23 11:32 PM   Abbreviations: M myopia (nearsighted); A astigmatism; H hyperopia (farsighted); P presbyopia; Mrx spectacle prescription;  CTL contact lenses; OD right eye; OS left eye; OU both eyes  XT exotropia; ET esotropia; PEK punctate epithelial keratitis; PEE punctate epithelial erosions; DES dry eye syndrome; MGD meibomian gland dysfunction; ATs  artificial tears; PFAT's preservative free artificial tears; NSC nuclear sclerotic cataract; PSC posterior subcapsular cataract; ERM epi-retinal membrane; PVD posterior vitreous detachment; RD retinal detachment; DM diabetes mellitus; DR diabetic retinopathy; NPDR non-proliferative diabetic retinopathy; PDR proliferative diabetic retinopathy; CSME clinically significant macular edema; DME diabetic macular edema; dbh dot blot hemorrhages; CWS cotton wool spot; POAG primary open angle glaucoma; C/D cup-to-disc ratio; HVF humphrey visual field; GVF goldmann visual field; OCT optical coherence tomography; IOP intraocular pressure; BRVO Branch retinal vein occlusion; CRVO central retinal vein occlusion; CRAO central retinal artery occlusion; BRAO branch retinal artery occlusion; RT retinal tear; SB scleral buckle; PPV pars plana vitrectomy; VH Vitreous hemorrhage; PRP panretinal laser photocoagulation; IVK intravitreal kenalog; VMT vitreomacular traction; MH Macular hole;  NVD neovascularization of the disc; NVE neovascularization elsewhere; AREDS age related eye disease study; ARMD age related macular degeneration; POAG primary open angle glaucoma; EBMD epithelial/anterior basement membrane dystrophy; ACIOL anterior chamber intraocular lens; IOL intraocular lens; PCIOL posterior chamber intraocular lens; Phaco/IOL phacoemulsification with intraocular lens placement; PRK photorefractive keratectomy; LASIK laser assisted in situ keratomileusis; HTN hypertension; DM diabetes mellitus; COPD chronic obstructive pulmonary disease

## 2023-06-06 ENCOUNTER — Encounter (INDEPENDENT_AMBULATORY_CARE_PROVIDER_SITE_OTHER): Payer: Medicare HMO | Admitting: Ophthalmology

## 2023-06-07 ENCOUNTER — Ambulatory Visit (INDEPENDENT_AMBULATORY_CARE_PROVIDER_SITE_OTHER): Payer: Medicare HMO | Admitting: Ophthalmology

## 2023-06-07 ENCOUNTER — Encounter (INDEPENDENT_AMBULATORY_CARE_PROVIDER_SITE_OTHER): Payer: Self-pay | Admitting: Ophthalmology

## 2023-06-07 DIAGNOSIS — H353211 Exudative age-related macular degeneration, right eye, with active choroidal neovascularization: Secondary | ICD-10-CM | POA: Diagnosis not present

## 2023-06-07 DIAGNOSIS — H43811 Vitreous degeneration, right eye: Secondary | ICD-10-CM | POA: Diagnosis not present

## 2023-06-07 DIAGNOSIS — H35412 Lattice degeneration of retina, left eye: Secondary | ICD-10-CM

## 2023-06-07 DIAGNOSIS — Z961 Presence of intraocular lens: Secondary | ICD-10-CM

## 2023-06-07 DIAGNOSIS — I10 Essential (primary) hypertension: Secondary | ICD-10-CM | POA: Diagnosis not present

## 2023-06-07 DIAGNOSIS — H35711 Central serous chorioretinopathy, right eye: Secondary | ICD-10-CM

## 2023-06-07 DIAGNOSIS — H35033 Hypertensive retinopathy, bilateral: Secondary | ICD-10-CM

## 2023-06-07 MED ORDER — FARICIMAB-SVOA 6 MG/0.05ML IZ SOSY
6.0000 mg | PREFILLED_SYRINGE | INTRAVITREAL | Status: AC | PRN
Start: 2023-06-07 — End: 2023-06-07
  Administered 2023-06-07: 6 mg via INTRAVITREAL

## 2023-06-30 NOTE — Progress Notes (Signed)
[facility] - Clinic Note  07/05/2023     CHIEF COMPLAINT Patient presents for Retina Follow Up  HISTORY OF PRESENT ILLNESS: Darlene Crawford is a 82 y.o. female who presents to the clinic today for:   HPI     Retina Follow Up   Patient presents with  Wet AMD.  In both eyes.  This started months ago.  Duration of 4 weeks.  Since onset it is stable.  I, the attending physician,  performed the HPI with the patient and updated documentation appropriately.        Comments   Patient feels the vision is the same. She is using AT's OU PRN.      Last edited by Rennis Chris, MD on 07/05/2023  1:13 PM.    Pt states vision is stable  Referring physician: Wendall Papa [address] [place],  [place] [address]  HISTORICAL INFORMATION:  Selected notes from the MEDICAL RECORD NUMBER Referred by Dr. Monico Blitz for concern of ARMD OD with progression to CNVM vs CSR   CURRENT MEDICATIONS: Current Outpatient Medications (Ophthalmic Drugs)  Medication Sig   ARTIFICIAL TEAR SOLUTION OP Place 1 drop into both eyes daily as needed (dry eyes).   Current Facility-Administered Medications (Ophthalmic Drugs)  Medication Route   aflibercept (EYLEA) SOLN 2 mg Intravitreal   aflibercept (EYLEA) SOLN 2 mg Intravitreal   aflibercept (EYLEA) SOLN 2 mg Intravitreal   Current Outpatient Medications (Other)  Medication Sig   acidophilus (RISAQUAD) CAPS capsule Take 1 capsule by mouth daily.   atorvastatin (LIPITOR) 10 MG tablet Take 10 mg by mouth at bedtime.    Cholecalciferol (VITAMIN D) 50 MCG (2000 UT) tablet Take 4,000 Units by mouth at bedtime.    Cyanocobalamin (B-12 PO) Take 1 Dose by mouth 2 (two) times a week.   fluticasone (FLONASE) 50 MCG/ACT nasal spray Place 1 spray into both nostrils daily as needed for allergies or rhinitis.   hydrochlorothiazide (HYDRODIURIL) 25 MG tablet Take 25 mg by mouth daily. 1/2 tablet am, 1/2 tablet pm   levothyroxine (SYNTHROID,  LEVOTHROID) 100 MCG tablet Take 100 mcg by mouth daily before breakfast.   loratadine (CLARITIN) 10 MG tablet Take 10 mg by mouth daily as needed for allergies.   losartan (COZAAR) 100 MG tablet Take 100 mg by mouth daily.    Melatonin 10 MG CAPS Take 10 mg by mouth at bedtime.   Multiple Vitamins-Minerals (PRESERVISION AREDS 2 PO) Take 1 tablet by mouth at bedtime.    sertraline (ZOLOFT) 50 MG tablet Take 50 mg by mouth daily.   sodium chloride (OCEAN) 0.65 % SOLN nasal spray Place 1 spray into both nostrils as needed for congestion.   Current Facility-Administered Medications (Other)  Medication Route   Bevacizumab (AVASTIN) SOLN 1.25 mg Intravitreal   Bevacizumab (AVASTIN) SOLN 1.25 mg Intravitreal   Bevacizumab (AVASTIN) SOLN 1.25 mg Intravitreal   REVIEW OF SYSTEMS: ROS   Positive for: Genitourinary, Musculoskeletal, Endocrine, Eyes, Psychiatric, Allergic/Imm Negative for: Constitutional, Gastrointestinal, Neurological, Skin, HENT, Cardiovascular, Respiratory, Heme/Lymph Last edited by Charlette Caffey, COT on 07/05/2023 12:26 PM.     ALLERGIES Allergies  Allergen Reactions   Trilyte [Peg 3350-Kcl-Na Bicarb-Nacl]     HEMATEMESIS   Vicodin [Hydrocodone-Acetaminophen] Nausea And Vomiting   PAST MEDICAL HISTORY Past Medical History:  Diagnosis Date   Anxiety and depression    Cataract    Hypercholesterolemia    Hypertension    Hypertensive retinopathy    OU  Hypothyroidism    Macular degeneration    OD   Osteoarthritis    PONV (postoperative nausea and vomiting)    Seasonal allergies    Past Surgical History:  Procedure Laterality Date   BIOPSY  10/01/2018   Procedure: BIOPSY;  Surgeon: West Bali, MD;  Location: [facility];  Service: Endoscopy;;  gastric bx & gastric polyp   CATARACT EXTRACTION W/PHACO Right 12/23/2022   Procedure: CATARACT EXTRACTION PHACO AND INTRAOCULAR LENS PLACEMENT (IOC);  Surgeon: Pecolia Ades, MD;  Location: [facility];  Service:  Ophthalmology;  Laterality: Right;  CDE: 11.88   CATARACT EXTRACTION W/PHACO Left 02/24/2023   Procedure: CATARACT EXTRACTION PHACO AND INTRAOCULAR LENS PLACEMENT (IOC);  Surgeon: Pecolia Ades, MD;  Location: [facility];  Service: Ophthalmology;  Laterality: Left;  CDE  5.82   COLONOSCOPY N/A 10/01/2018   Procedure: COLONOSCOPY;  Surgeon: West Bali, MD;  Location: [facility];  Service: Endoscopy;  Laterality: N/A;  10:30am   ESOPHAGOGASTRODUODENOSCOPY N/A 10/01/2018   Procedure: ESOPHAGOGASTRODUODENOSCOPY (EGD);  Surgeon: West Bali, MD;  Location: [facility];  Service: Endoscopy;  Laterality: N/A;   POLYPECTOMY  10/01/2018   Procedure: POLYPECTOMY;  Surgeon: West Bali, MD;  Location: [facility];  Service: Endoscopy;;  colon    TUBAL LIGATION     UTERINE FIBROID SURGERY     FAMILY HISTORY Family History  Problem Relation Age of Onset   Cancer Mother        lung   Stroke Father    Stroke Brother    Colon cancer Neg Hx    SOCIAL HISTORY Social History   Tobacco Use   Smoking status: Former    Current packs/day: 0.00    Average packs/day: 0.3 packs/day for 4.0 years (1.0 ttl pk-yrs)    Types: Cigarettes    Start date: 11/04/1960    Quit date: 11/04/1964    Years since quitting: 58.7   Smokeless tobacco: Never  Vaping Use   Vaping status: Never Used  Substance Use Topics   Alcohol use: No   Drug use: No       OPHTHALMIC EXAM:  Base Eye Exam     Visual Acuity (Snellen - Linear)       Right Left   Dist [place] 20/25 20/20   Dist ph [place] NI          Tonometry (Tonopen, 12:28 PM)       Right Left   Pressure 9 10         Pupils       Dark Light Shape React APD   Right 3 2 Round Brisk None   Left 3 2 Round Brisk None         Visual Fields       Left Right    Full Full         Extraocular Movement       Right Left    Full, Ortho Full, Ortho         Neuro/Psych     Oriented x3: Yes   Mood/Affect: Normal         Dilation      Right eye: 2.5% Phenylephrine @ 12:27 PM           Slit Lamp and Fundus Exam     Slit Lamp Exam       Right Left   Lids/Lashes Dermatochalasis - upper lid Dermatochalasis - upper lid   Conjunctiva/Sclera White and quiet White  and quiet   Cornea Arcus, well healed cataract wound Arcus, early nasal band K, 1+PEE   Anterior Chamber Deep, 1-2+ fine cell / pigment moderate depth, with narrow angle temporally   Iris Round and well dilated Round and well dilated   Lens PC IOL in good position PC IOL in good postition   Anterior Vitreous Vitreous syneresis, Posterior vitreous detachment, Weiss ring Vitreous syneresis         Fundus Exam       Right Left   Disc Pink and Sharp, Compact Pink and Sharp, Tilted disc   C/D Ratio 0.2 0.3   Macula Flat, blunted foveal reflex; central SRF - slightly improved; +drusen; +RPE mottling and clumping, small cluster of drusen and RPE mottling inferior fovea, no heme Flat, good foveal reflex, Retinal pigment epithelial mottling, superior para-foveal focal area of RPE atrophy -- stable, rare drusen, No heme or edema   Vessels attenuated, Tortuous Vascular attenuation, Tortuous   Periphery Attached, peripheral drusen / RPE changes nasally, peripheral cystoid degeneration Attached, very mild lattice at 0430, mild patch of Lattice degeneration at 0600 -- stable, scattered peripheral drusen, peripheral cystoid degeneration           IMAGING AND PROCEDURES  Imaging and Procedures for 12/11/17  OCT, Retina - OU - Both Eyes       Right Eye Quality was good. Central Foveal Thickness: 410. Progression has improved. Findings include no IRF, abnormal foveal contour, retinal drusen , pigment epithelial detachment, subretinal fluid, outer retinal atrophy (Mild interval improvement in central SRF).   Left Eye Quality was good. Central Foveal Thickness: 275. Progression has been stable. Findings include normal foveal contour, no IRF, no SRF, retinal  drusen .   Notes *Images captured and stored on drive  Diagnosis / Impression:  OD: mild interval improvement in central SRF OS: NFP, No IRF/SRF  Clinical management:  See below  Abbreviations: NFP - Normal foveal profile. CME - cystoid macular edema. PED - pigment epithelial detachment. IRF - intraretinal fluid. SRF - subretinal fluid. EZ - ellipsoid zone. ERM - epiretinal membrane. ORA - outer retinal atrophy. ORT - outer retinal tubulation. SRHM - subretinal hyper-reflective material       Intravitreal Injection, Pharmacologic Agent - OD - Right Eye       Time Out 07/05/2023. 1:02 PM. Confirmed correct patient, procedure, site, and patient consented.   Anesthesia Topical anesthesia was used. Anesthetic medications included Lidocaine 2%, Proparacaine 0.5%.   Procedure Preparation included 5% betadine to ocular surface, eyelid speculum. A (32g) needle was used.   Injection: 6 mg faricimab-svoa 6 MG/0.05ML   Route: Intravitreal, Site: Right Eye   NDC: [id number], Lot: [id number], Expiration date: 11/26/2024, Waste: 0 mL   Post-op Post injection exam found visual acuity of at least counting fingers. The patient tolerated the procedure well. There were no complications. The patient received written and verbal post procedure care education. Post injection medications were not given.            ASSESSMENT/PLAN:    ICD-10-CM   1. Exudative age-related macular degeneration of right eye with active choroidal neovascularization (HCC)  H35.3211 OCT, Retina - OU - Both Eyes    Intravitreal Injection, Pharmacologic Agent - OD - Right Eye    faricimab-svoa (VABYSMO) 6mg /0.76mL intravitreal injection    2. 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 Essential hypertension  I10     4. Hypertensive retinopathy of both eyes  H35.033  5. 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 Posterior vitreous detachment of right eye  H43.811     7.  Pseudophakia  Z96.1      1,2. Exudative age related macular degeneration vs CSCR OD  - FA (03.02.19) without leakage / pooling into area of SRF - review of systems reveals significant stressors in life, but no exogenous steroid use - S/P IVA #1 OD (09.27.19),  #2 (10.25.19), #3 (11.22.19) -- minimal response -- IVA resistance  - pt approved for [employer] and IVE  ================================================================= - S/P IVE OD #1 (12.20.19), #2 (01.20.20), #3 (02.18.20), #4 (07.20.20) #5 (09.01.20), #6 (10.13.20), #7 (11.20.20), #8 (01.20.21), #9 (02.17.21), #10 (03.19.21), #11 (04.20.21), #12 (05.25.21), #13 (6.23.21), #14 (07.27.21), #15 (08.30.21), #16 (9.28.21), #17 (10.26.21), #18 (11.29.21), #19 (01.11.22), #20 (02.08.22), #21 (3.8.22), #22 (04.05.22), #23 (5.10.22), #24 (06.21.22), #25 (8.2.22), #26 (9.20.22), #27 (11.15.22), #28 (01.17.23), #29 (03.29.23), #30 (06.21.23), #31 (09.27.23), #32 (01.16.24), #33 (05.07.24), #34 (06.18.24), #35 (07.30.24) -- IVE resistance - s/p IVV OD #1 (09.03.24), #2 (10.03.24)  - lost to f/u from Feb 2020 to July 2020 due to COVID-19 concerns - today, BCVA OD 20/25 from 20/30  - OCT shows interval improvement in central SRF at 4 weeks  - recommend IVV OD #3 today, 11.06.24 -- with follow up in 4 wks  - pt wishes to proceed with IVV OD  - RBA of procedure discussed, questions answered  - informed consent obtained   - Vabysmo informed consent form signed and scanned on 09.03.24  - see procedure note  - [email or web address] benefits investigation initiated -- approved for 2024  - Vabysmo approved for 2024  - f/u 4 weeks -- DFE/OCT/ possible injection  3,4. Hypertensive retinopathy OU  - discussed importance of tight BP control  - stable  - monitor  5. Lattice degeneration OS  - patch of inferior lattice OS -- no RT, holes or SRF  - asymptomatic   - discussed findings, prognosis, and treatment options including observation  - monitor  6. PVD /  vitreous syneresis OD-   - Discussed findings and prognosis  - No RT or RD on 360 peripheral exam  - Reviewed s/s of RT/RD  - Strict return precautions for any such RT/RD signs/symptoms  7. Pseudophakia OU  - s/p CE/IOL OD (Dr. Ilsa Iha, 04.26.24), OS 02/24/23  - IOL in good position, doing well  - monitor  Ophthalmic Meds Ordered this visit:  Meds ordered this encounter  Medications   faricimab-svoa (VABYSMO) 6mg /0.56mL intravitreal injection     Return in about 4 weeks (around 08/02/2023) for f/u exu ARMD OD, DFE, OCT.  There are no Patient Instructions on file for this visit.   This document serves as a record of services personally performed by Karie Chimera, MD, PhD. It was created on their behalf by De Blanch, an ophthalmic technician. The creation of this record is the provider's dictation and/or activities during the visit.    Electronically signed by: De Blanch, OA, 07/05/23  1:14 PM  This document serves as a record of services personally performed by Karie Chimera, MD, PhD. It was created on their behalf by Glee Arvin. Manson Passey, OA an ophthalmic technician. The creation of this record is the provider's dictation and/or activities during the visit.    Electronically signed by: Glee Arvin. Manson Passey, OA 07/05/23 1:14 PM  Karie Chimera, M.D., Ph.D. Diseases & Surgery of the Retina and Vitreous [facility]  I have reviewed the above documentation for accuracy and completeness, and I agree with the above. Karie Chimera, M.D., Ph.D. 07/05/23 1:15 PM   Abbreviations: M myopia (nearsighted); A astigmatism; H hyperopia (farsighted); P presbyopia; Mrx spectacle prescription;  CTL contact lenses; OD right eye; OS left eye; OU both eyes  XT exotropia; ET esotropia; PEK punctate epithelial keratitis; PEE punctate epithelial erosions; DES dry eye syndrome; MGD meibomian gland dysfunction; ATs artificial tears; PFAT's preservative free artificial tears;  NSC nuclear sclerotic cataract; PSC posterior subcapsular cataract; ERM epi-retinal membrane; PVD posterior vitreous detachment; RD retinal detachment; DM diabetes mellitus; DR diabetic retinopathy; NPDR non-proliferative diabetic retinopathy; PDR proliferative diabetic retinopathy; CSME clinically significant macular edema; DME diabetic macular edema; dbh dot blot hemorrhages; CWS cotton wool spot; POAG primary open angle glaucoma; C/D cup-to-disc ratio; HVF humphrey visual field; GVF goldmann visual field; OCT optical coherence tomography; IOP intraocular pressure; BRVO Branch retinal vein occlusion; CRVO central retinal vein occlusion; CRAO central retinal artery occlusion; BRAO branch retinal artery occlusion; RT retinal tear; SB scleral buckle; PPV pars plana vitrectomy; VH Vitreous hemorrhage; PRP panretinal laser photocoagulation; IVK intravitreal kenalog; VMT vitreomacular traction; MH Macular hole;  NVD neovascularization of the disc; NVE neovascularization elsewhere; AREDS age related eye disease study; ARMD age related macular degeneration; POAG primary open angle glaucoma; EBMD epithelial/anterior basement membrane dystrophy; ACIOL anterior chamber intraocular lens; IOL intraocular lens; PCIOL posterior chamber intraocular lens; Phaco/IOL phacoemulsification with intraocular lens placement; PRK photorefractive keratectomy; LASIK laser assisted in situ keratomileusis; HTN hypertension; DM diabetes mellitus; COPD chronic obstructive pulmonary disease

## 2023-07-05 ENCOUNTER — Encounter (INDEPENDENT_AMBULATORY_CARE_PROVIDER_SITE_OTHER): Payer: Self-pay | Admitting: Ophthalmology

## 2023-07-05 ENCOUNTER — Ambulatory Visit (INDEPENDENT_AMBULATORY_CARE_PROVIDER_SITE_OTHER): Payer: Medicare HMO | Admitting: Ophthalmology

## 2023-07-05 DIAGNOSIS — H35711 Central serous chorioretinopathy, right eye: Secondary | ICD-10-CM

## 2023-07-05 DIAGNOSIS — Z961 Presence of intraocular lens: Secondary | ICD-10-CM | POA: Diagnosis not present

## 2023-07-05 DIAGNOSIS — I10 Essential (primary) hypertension: Secondary | ICD-10-CM

## 2023-07-05 DIAGNOSIS — H43811 Vitreous degeneration, right eye: Secondary | ICD-10-CM | POA: Diagnosis not present

## 2023-07-05 DIAGNOSIS — H35033 Hypertensive retinopathy, bilateral: Secondary | ICD-10-CM

## 2023-07-05 DIAGNOSIS — H35412 Lattice degeneration of retina, left eye: Secondary | ICD-10-CM | POA: Diagnosis not present

## 2023-07-05 DIAGNOSIS — H353211 Exudative age-related macular degeneration, right eye, with active choroidal neovascularization: Secondary | ICD-10-CM | POA: Diagnosis not present

## 2023-07-05 MED ORDER — FARICIMAB-SVOA 6 MG/0.05ML IZ SOSY
6.0000 mg | PREFILLED_SYRINGE | INTRAVITREAL | Status: AC | PRN
Start: 2023-07-05 — End: 2023-07-05
  Administered 2023-07-05: 6 mg via INTRAVITREAL

## 2023-07-25 NOTE — Progress Notes (Signed)
[facility] - Clinic Note  08/02/2023     CHIEF COMPLAINT Patient presents for Retina Follow Up  HISTORY OF PRESENT ILLNESS: Darlene Crawford is a 82 y.o. female who presents to the clinic today for:   HPI     Retina Follow Up   Patient presents with  Wet AMD.  In both eyes.  This started months ago.  Duration of 4 weeks.  Since onset it is stable.  I, the attending physician,  performed the HPI with the patient and updated documentation appropriately.        Comments   Patient states she is having a hard time reading small print. She is [place] AT's.       Last edited by Rennis Chris, MD on 08/02/2023  5:18 PM.     Referring physician: Wendall Papa [address] [place],  [place] [address]  HISTORICAL INFORMATION:  Selected notes from the MEDICAL RECORD NUMBER Referred by Dr. Monico Blitz for concern of ARMD OD with progression to CNVM vs CSR   CURRENT MEDICATIONS: Current Outpatient Medications (Ophthalmic Drugs)  Medication Sig   ARTIFICIAL TEAR SOLUTION OP Place 1 drop into both eyes daily as needed (dry eyes).   Current Facility-Administered Medications (Ophthalmic Drugs)  Medication Route   aflibercept (EYLEA) SOLN 2 mg Intravitreal   aflibercept (EYLEA) SOLN 2 mg Intravitreal   aflibercept (EYLEA) SOLN 2 mg Intravitreal   Current Outpatient Medications (Other)  Medication Sig   acidophilus (RISAQUAD) CAPS capsule Take 1 capsule by mouth daily.   atorvastatin (LIPITOR) 10 MG tablet Take 10 mg by mouth at bedtime.    Cholecalciferol (VITAMIN D) 50 MCG (2000 UT) tablet Take 4,000 Units by mouth at bedtime.    Cyanocobalamin (B-12 PO) Take 1 Dose by mouth 2 (two) times a week.   fluticasone (FLONASE) 50 MCG/ACT nasal spray Place 1 spray into both nostrils daily as needed for allergies or rhinitis.   hydrochlorothiazide (HYDRODIURIL) 25 MG tablet Take 25 mg by mouth daily. 1/2 tablet am, 1/2 tablet pm   levothyroxine (SYNTHROID, LEVOTHROID) 100 MCG  tablet Take 100 mcg by mouth daily before breakfast.   loratadine (CLARITIN) 10 MG tablet Take 10 mg by mouth daily as needed for allergies.   losartan (COZAAR) 100 MG tablet Take 100 mg by mouth daily.    Melatonin 10 MG CAPS Take 10 mg by mouth at bedtime.   Multiple Vitamins-Minerals (PRESERVISION AREDS 2 PO) Take 1 tablet by mouth at bedtime.    sertraline (ZOLOFT) 50 MG tablet Take 50 mg by mouth daily.   sodium chloride (OCEAN) 0.65 % SOLN nasal spray Place 1 spray into both nostrils as needed for congestion.   Current Facility-Administered Medications (Other)  Medication Route   Bevacizumab (AVASTIN) SOLN 1.25 mg Intravitreal   Bevacizumab (AVASTIN) SOLN 1.25 mg Intravitreal   Bevacizumab (AVASTIN) SOLN 1.25 mg Intravitreal   REVIEW OF SYSTEMS: ROS   Positive for: Genitourinary, Musculoskeletal, Endocrine, Eyes, Psychiatric, Allergic/Imm Negative for: Constitutional, Gastrointestinal, Neurological, Skin, HENT, Cardiovascular, Respiratory, Heme/Lymph Last edited by Charlette Caffey, COT on 08/02/2023  1:38 PM.      ALLERGIES Allergies  Allergen Reactions   Trilyte [Peg 3350-Kcl-Na Bicarb-Nacl]     HEMATEMESIS   Vicodin [Hydrocodone-Acetaminophen] Nausea And Vomiting   PAST MEDICAL HISTORY Past Medical History:  Diagnosis Date   Anxiety and depression    Cataract    Hypercholesterolemia    Hypertension    Hypertensive retinopathy    OU  Hypothyroidism    Macular degeneration    OD   Osteoarthritis    PONV (postoperative nausea and vomiting)    Seasonal allergies    Past Surgical History:  Procedure Laterality Date   BIOPSY  10/01/2018   Procedure: BIOPSY;  Surgeon: West Bali, MD;  Location: [facility];  Service: Endoscopy;;  gastric bx & gastric polyp   CATARACT EXTRACTION W/PHACO Right 12/23/2022   Procedure: CATARACT EXTRACTION PHACO AND INTRAOCULAR LENS PLACEMENT (IOC);  Surgeon: Pecolia Ades, MD;  Location: [facility];  Service: Ophthalmology;   Laterality: Right;  CDE: 11.88   CATARACT EXTRACTION W/PHACO Left 02/24/2023   Procedure: CATARACT EXTRACTION PHACO AND INTRAOCULAR LENS PLACEMENT (IOC);  Surgeon: Pecolia Ades, MD;  Location: [facility];  Service: Ophthalmology;  Laterality: Left;  CDE  5.82   COLONOSCOPY N/A 10/01/2018   Procedure: COLONOSCOPY;  Surgeon: West Bali, MD;  Location: [facility];  Service: Endoscopy;  Laterality: N/A;  10:30am   ESOPHAGOGASTRODUODENOSCOPY N/A 10/01/2018   Procedure: ESOPHAGOGASTRODUODENOSCOPY (EGD);  Surgeon: West Bali, MD;  Location: [facility];  Service: Endoscopy;  Laterality: N/A;   POLYPECTOMY  10/01/2018   Procedure: POLYPECTOMY;  Surgeon: West Bali, MD;  Location: [facility];  Service: Endoscopy;;  colon    TUBAL LIGATION     UTERINE FIBROID SURGERY     FAMILY HISTORY Family History  Problem Relation Age of Onset   Cancer Mother        lung   Stroke Father    Stroke Brother    Colon cancer Neg Hx    SOCIAL HISTORY Social History   Tobacco Use   Smoking status: Former    Current packs/day: 0.00    Average packs/day: 0.3 packs/day for 4.0 years (1.0 ttl pk-yrs)    Types: Cigarettes    Start date: 11/04/1960    Quit date: 11/04/1964    Years since quitting: 58.7   Smokeless tobacco: Never  Vaping Use   Vaping status: Never Used  Substance Use Topics   Alcohol use: No   Drug use: No       OPHTHALMIC EXAM:  Base Eye Exam     Visual Acuity (Snellen - Linear)       Right Left   Dist [place] 20/25 20/25   Dist ph  NI NI         Tonometry (Tonopen, 1:41 PM)       Right Left   Pressure 8 7         Pupils       Dark Light Shape React APD   Right 3 2 Round Brisk None   Left 3 2 Round Brisk None         Visual Fields       Left Right    Full Full         Extraocular Movement       Right Left    Full, Ortho Full, Ortho         Neuro/Psych     Oriented x3: Yes   Mood/Affect: Normal         Dilation     Right eye:  2.5% Phenylephrine @ 1:39 PM           Slit Lamp and Fundus Exam     Slit Lamp Exam       Right Left   Lids/Lashes Dermatochalasis - upper lid Dermatochalasis - upper lid   Conjunctiva/Sclera White and quiet White  and quiet   Cornea Arcus, well healed cataract wound Arcus, early nasal band K, 1+PEE   Anterior Chamber Deep, 1-2+ fine cell / pigment moderate depth, with narrow angle temporally   Iris Round and well dilated Round and well dilated   Lens PC IOL in good position PC IOL in good postition   Anterior Vitreous Vitreous syneresis, Posterior vitreous detachment, Weiss ring Vitreous syneresis         Fundus Exam       Right Left   Disc Pink and Sharp, Compact Pink and Sharp, Tilted disc   C/D Ratio 0.2 0.3   Macula Flat, blunted foveal reflex; central SRF - slightly increased +drusen; +RPE mottling and clumping, small cluster of drusen and RPE mottling inferior fovea, no heme Flat, good foveal reflex, Retinal pigment epithelial mottling, superior para-foveal focal area of RPE atrophy -- stable, rare drusen, No heme or edema   Vessels attenuated, Tortuous Vascular attenuation, Tortuous   Periphery Attached, peripheral drusen / RPE changes nasally, peripheral cystoid degeneration Attached, very mild lattice at 0430, mild patch of Lattice degeneration at 0600 -- stable, scattered peripheral drusen, peripheral cystoid degeneration           IMAGING AND PROCEDURES  Imaging and Procedures for 12/11/17  OCT, Retina - OU - Both Eyes       Right Eye Quality was good. Central Foveal Thickness: 427. Progression has worsened. Findings include no IRF, abnormal foveal contour, retinal drusen , pigment epithelial detachment, subretinal fluid, outer retinal atrophy (Persistent central SRF -- slightly increased).   Left Eye Quality was good. Central Foveal Thickness: 276. Progression has been stable. Findings include normal foveal contour, no IRF, no SRF, retinal drusen .    Notes *Images captured and stored on drive  Diagnosis / Impression:  OD: Persistent central SRF -- slightly increased OS: NFP, No IRF/SRF  Clinical management:  See below  Abbreviations: NFP - Normal foveal profile. CME - cystoid macular edema. PED - pigment epithelial detachment. IRF - intraretinal fluid. SRF - subretinal fluid. EZ - ellipsoid zone. ERM - epiretinal membrane. ORA - outer retinal atrophy. ORT - outer retinal tubulation. SRHM - subretinal hyper-reflective material       Intravitreal Injection, Pharmacologic Agent - OD - Right Eye       Time Out 08/02/2023. 2:15 PM. Confirmed correct patient, procedure, site, and patient consented.   Anesthesia Topical anesthesia was used. Anesthetic medications included Lidocaine 2%, Proparacaine 0.5%.   Procedure Preparation included 5% betadine to ocular surface, eyelid speculum. A (32g) needle was used.   Injection: 6 mg faricimab-svoa 6 MG/0.05ML   Route: Intravitreal, Site: Right Eye   NDC: 95621-308-65, Lot: [id number], Expiration date: 06/28/2024, Waste: 0 mL   Post-op Post injection exam found visual acuity of at least counting fingers. The patient tolerated the procedure well. There were no complications. The patient received written and verbal post procedure care education. Post injection medications were not given.            ASSESSMENT/PLAN:    ICD-10-CM   1. Exudative age-related macular degeneration of right eye with active choroidal neovascularization (HCC)  H35.3211 OCT, Retina - OU - Both Eyes    Intravitreal Injection, Pharmacologic Agent - OD - Right Eye    faricimab-svoa (VABYSMO) 6mg /0.15mL intravitreal injection    2. 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 Essential hypertension  I10     4. Hypertensive retinopathy of both eyes  H35.033  5. 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 Posterior vitreous detachment of right eye  H43.811     7. Pseudophakia   Z96.1       1,2. Exudative age related macular degeneration vs CSCR OD  - FA (03.02.19) without leakage / pooling into area of SRF - review of systems reveals significant stressors in life, but no exogenous steroid use - S/P IVA #1 OD (09.27.19),  #2 (10.25.19), #3 (11.22.19) -- minimal response -- IVA resistance  - pt approved for [employer] and IVE  ================================================================= - S/P IVE OD #1 (12.20.19), #2 (01.20.20), #3 (02.18.20), #4 (07.20.20) #5 (09.01.20), #6 (10.13.20), #7 (11.20.20), #8 (01.20.21), #9 (02.17.21), #10 (03.19.21), #11 (04.20.21), #12 (05.25.21), #13 (6.23.21), #14 (07.27.21), #15 (08.30.21), #16 (9.28.21), #17 (10.26.21), #18 (11.29.21), #19 (01.11.22), #20 (02.08.22), #21 (3.8.22), #22 (04.05.22), #23 (5.10.22), #24 (06.21.22), #25 (8.2.22), #26 (9.20.22), #27 (11.15.22), #28 (01.17.23), #29 (03.29.23), #30 (06.21.23), #31 (09.27.23), #32 (01.16.24), #33 (05.07.24), #34 (06.18.24), #35 (07.30.24) -- IVE resistance - s/p IVV OD #1 (09.03.24), #2 (10.03.24), #3 (11.06.24)  - lost to f/u from Feb 2020 to July 2020 due to COVID-19 concerns - today, BCVA OD 20/25 -- stable  - OCT shows Persistent central SRF -- slightly increased **discussed decreased efficacy / resistance to Vabysmo and potential benefit of switching medication**   - recommend IVV OD #4 today, 12.04.24 -- with follow up in 4 wks  - pt wishes to proceed with IVV OD  - RBA of procedure discussed, questions answered  - informed consent obtained   - Vabysmo informed consent form signed and scanned on 09.03.24  - see procedure note  - Eylea4U benefits investigation initiated -- approved for 2024  - Vabysmo approved for 2024  - will check authorization for EyleaHD  - f/u 4 weeks -- DFE/OCT/ possible injection  3,4. Hypertensive retinopathy OU  - discussed importance of tight BP control  - stable  - monitor  5. Lattice degeneration OS  - patch of inferior lattice OS  -- no RT, holes or SRF  - asymptomatic   - discussed findings, prognosis, and treatment options including observation  - monitor  6. PVD / vitreous syneresis OD-   - Discussed findings and prognosis  - No RT or RD on 360 peripheral exam  - Reviewed s/s of RT/RD  - Strict return precautions for any such RT/RD signs/symptoms  7. Pseudophakia OU  - s/p CE/IOL OD (Dr. Ilsa Iha, 04.26.24), OS 02/24/23  - IOL in good position, doing well  - monitor  Ophthalmic Meds Ordered this visit:  Meds ordered this encounter  Medications   faricimab-svoa (VABYSMO) 6mg /0.49mL intravitreal injection     Return in about 4 weeks (around 08/30/2023) for f/u exu ARMD OD, DFE, OCT.  There are no Patient Instructions on file for this visit.   This document serves as a record of services personally performed by Karie Chimera, MD, PhD. It was created on their behalf by De Blanch, an ophthalmic technician. The creation of this record is the provider's dictation and/or activities during the visit.    Electronically signed by: De Blanch, OA, 08/02/23  5:19 PM  This document serves as a record of services personally performed by Karie Chimera, MD, PhD. It was created on their behalf by Glee Arvin. Manson Passey, OA an ophthalmic technician. The creation of this record is the provider's dictation and/or activities during the visit.    Electronically signed by: Glee Arvin. Manson Passey, OA 08/02/23 5:19  PM  Karie Chimera, M.D., Ph.D. Diseases & Surgery of the Retina and Vitreous [facility]  I have reviewed the above documentation for accuracy and completeness, and I agree with the above. Karie Chimera, M.D., Ph.D. 08/02/23 5:20 PM   Abbreviations: M myopia (nearsighted); A astigmatism; H hyperopia (farsighted); P presbyopia; Mrx spectacle prescription;  CTL contact lenses; OD right eye; OS left eye; OU both eyes  XT exotropia; ET esotropia; PEK punctate epithelial keratitis; PEE  punctate epithelial erosions; DES dry eye syndrome; MGD meibomian gland dysfunction; ATs artificial tears; PFAT's preservative free artificial tears; NSC nuclear sclerotic cataract; PSC posterior subcapsular cataract; ERM epi-retinal membrane; PVD posterior vitreous detachment; RD retinal detachment; DM diabetes mellitus; DR diabetic retinopathy; NPDR non-proliferative diabetic retinopathy; PDR proliferative diabetic retinopathy; CSME clinically significant macular edema; DME diabetic macular edema; dbh dot blot hemorrhages; CWS cotton wool spot; POAG primary open angle glaucoma; C/D cup-to-disc ratio; HVF humphrey visual field; GVF goldmann visual field; OCT optical coherence tomography; IOP intraocular pressure; BRVO Branch retinal vein occlusion; CRVO central retinal vein occlusion; CRAO central retinal artery occlusion; BRAO branch retinal artery occlusion; RT retinal tear; SB scleral buckle; PPV pars plana vitrectomy; VH Vitreous hemorrhage; PRP panretinal laser photocoagulation; IVK intravitreal kenalog; VMT vitreomacular traction; MH Macular hole;  NVD neovascularization of the disc; NVE neovascularization elsewhere; AREDS age related eye disease study; ARMD age related macular degeneration; POAG primary open angle glaucoma; EBMD epithelial/anterior basement membrane dystrophy; ACIOL anterior chamber intraocular lens; IOL intraocular lens; PCIOL posterior chamber intraocular lens; Phaco/IOL phacoemulsification with intraocular lens placement; PRK photorefractive keratectomy; LASIK laser assisted in situ keratomileusis; HTN hypertension; DM diabetes mellitus; COPD chronic obstructive pulmonary disease

## 2023-08-02 ENCOUNTER — Encounter (INDEPENDENT_AMBULATORY_CARE_PROVIDER_SITE_OTHER): Payer: Self-pay | Admitting: Ophthalmology

## 2023-08-02 ENCOUNTER — Ambulatory Visit (INDEPENDENT_AMBULATORY_CARE_PROVIDER_SITE_OTHER): Payer: Medicare HMO | Admitting: Ophthalmology

## 2023-08-02 DIAGNOSIS — H353211 Exudative age-related macular degeneration, right eye, with active choroidal neovascularization: Secondary | ICD-10-CM

## 2023-08-02 DIAGNOSIS — I10 Essential (primary) hypertension: Secondary | ICD-10-CM

## 2023-08-02 DIAGNOSIS — H43811 Vitreous degeneration, right eye: Secondary | ICD-10-CM | POA: Diagnosis not present

## 2023-08-02 DIAGNOSIS — H35711 Central serous chorioretinopathy, right eye: Secondary | ICD-10-CM

## 2023-08-02 DIAGNOSIS — Z961 Presence of intraocular lens: Secondary | ICD-10-CM

## 2023-08-02 DIAGNOSIS — H35033 Hypertensive retinopathy, bilateral: Secondary | ICD-10-CM | POA: Diagnosis not present

## 2023-08-02 DIAGNOSIS — H35412 Lattice degeneration of retina, left eye: Secondary | ICD-10-CM

## 2023-08-02 MED ORDER — FARICIMAB-SVOA 6 MG/0.05ML IZ SOSY
6.0000 mg | PREFILLED_SYRINGE | INTRAVITREAL | Status: AC | PRN
Start: 2023-08-02 — End: 2023-08-02
  Administered 2023-08-02: 6 mg via INTRAVITREAL

## 2023-08-21 NOTE — Progress Notes (Signed)
 [facility] - Clinic Note  08/31/2023     CHIEF COMPLAINT Patient presents for Retina Follow Up  HISTORY OF PRESENT ILLNESS: Darlene Crawford is a 82 y.o. 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  This started 4 weeks ago.  I, the attending physician,  performed the HPI with the patient and updated documentation appropriately.        Comments   Patient here for 4 weeks retina follow up for exu ARMD OD. Patient states vision is worse in OS distance. Can't see like used to. Has like a film of OS after cataract surgery. OD can't tell that much difference. No eye pain.       Last edited by Valdemar Rogue, MD on 08/31/2023  6:35 PM.      Referring physician: Hairfield, Keavie C [address] [place],  [place] [address]  HISTORICAL INFORMATION:  Selected notes from the MEDICAL RECORD NUMBER Referred by Dr. FABIENE Moats for concern of ARMD OD with progression to CNVM vs CSR   CURRENT MEDICATIONS: Current Outpatient Medications (Ophthalmic Drugs)  Medication Sig   ARTIFICIAL TEAR SOLUTION OP Place 1 drop into both eyes daily as needed (dry eyes).   Current Facility-Administered Medications (Ophthalmic Drugs)  Medication Route   aflibercept  (EYLEA ) SOLN 2 mg Intravitreal   aflibercept  (EYLEA ) SOLN 2 mg Intravitreal   aflibercept  (EYLEA ) SOLN 2 mg Intravitreal   Current Outpatient Medications (Other)  Medication Sig   acidophilus (RISAQUAD) CAPS capsule Take 1 capsule by mouth daily.   Cholecalciferol (VITAMIN D) 50 MCG (2000 UT) tablet Take 4,000 Units by mouth at bedtime.    Cyanocobalamin (B-12 PO) Take 1 Dose by mouth 2 (two) times a week.   fluticasone (FLONASE) 50 MCG/ACT nasal spray Place 1 spray into both nostrils daily as needed for allergies or rhinitis.   hydrochlorothiazide (HYDRODIURIL) 25 MG tablet Take 25 mg by mouth daily. 1/2 tablet am, 1/2 tablet pm   levothyroxine  (SYNTHROID, LEVOTHROID) 100 MCG tablet Take 100 mcg by mouth daily before breakfast.   loratadine (CLARITIN) 10 MG tablet Take 10 mg by mouth daily as needed for allergies.   losartan (COZAAR) 100 MG tablet Take 100 mg by mouth daily.    Melatonin 10 MG CAPS Take 10 mg by mouth at bedtime.   Multiple Vitamins-Minerals (PRESERVISION AREDS 2 PO) Take 1 tablet by mouth at bedtime.    sodium chloride  (OCEAN) 0.65 % SOLN nasal spray Place 1 spray into both nostrils as needed for congestion.   atorvastatin (LIPITOR) 10 MG tablet Take 10 mg by mouth at bedtime.    sertraline (ZOLOFT) 50 MG tablet Take 50 mg by mouth daily. (Patient not taking: Reported on 08/31/2023)   Current Facility-Administered Medications (Other)  Medication Route   Bevacizumab  (AVASTIN ) SOLN 1.25 mg Intravitreal   Bevacizumab  (AVASTIN ) SOLN 1.25 mg Intravitreal   Bevacizumab  (AVASTIN ) SOLN 1.25 mg Intravitreal   REVIEW OF SYSTEMS: ROS   Positive for: Genitourinary, Musculoskeletal, Endocrine, Eyes, Psychiatric, Allergic/Imm Negative for: Constitutional, Gastrointestinal, Neurological, Skin, HENT, Cardiovascular, Respiratory, Heme/Lymph Last edited by Orval Asberry RAMAN, COA on 08/31/2023 10:40 AM.     ALLERGIES Allergies  Allergen Reactions   Trilyte [Peg 3350 -Kcl-Na Bicarb-Nacl]     HEMATEMESIS   Vicodin [Hydrocodone-Acetaminophen] Nausea And Vomiting   PAST MEDICAL HISTORY Past Medical History:  Diagnosis Date   Anxiety  and depression    Cataract    Hypercholesterolemia    Hypertension    Hypertensive retinopathy    OU   Hypothyroidism    Macular degeneration    OD   Osteoarthritis    PONV (postoperative nausea and vomiting)    Seasonal allergies    Past Surgical History:  Procedure Laterality Date   BIOPSY  10/01/2018   Procedure: BIOPSY;  Surgeon: Harvey Margo CROME, MD;  Location: [facility];  Service: Endoscopy;;  gastric bx & gastric polyp   CATARACT EXTRACTION W/PHACO Right 12/23/2022   Procedure:  CATARACT EXTRACTION PHACO AND INTRAOCULAR LENS PLACEMENT (IOC);  Surgeon: Juli Blunt, MD;  Location: [facility];  Service: Ophthalmology;  Laterality: Right;  CDE: 11.88   CATARACT EXTRACTION W/PHACO Left 02/24/2023   Procedure: CATARACT EXTRACTION PHACO AND INTRAOCULAR LENS PLACEMENT (IOC);  Surgeon: Juli Blunt, MD;  Location: [facility];  Service: Ophthalmology;  Laterality: Left;  CDE  5.82   COLONOSCOPY N/A 10/01/2018   Procedure: COLONOSCOPY;  Surgeon: Harvey Margo CROME, MD;  Location: [facility];  Service: Endoscopy;  Laterality: N/A;  10:30am   ESOPHAGOGASTRODUODENOSCOPY N/A 10/01/2018   Procedure: ESOPHAGOGASTRODUODENOSCOPY (EGD);  Surgeon: Harvey Margo CROME, MD;  Location: [facility];  Service: Endoscopy;  Laterality: N/A;   POLYPECTOMY  10/01/2018   Procedure: POLYPECTOMY;  Surgeon: Harvey Margo CROME, MD;  Location: [facility];  Service: Endoscopy;;  colon    TUBAL LIGATION     UTERINE FIBROID SURGERY     FAMILY HISTORY Family History  Problem Relation Age of Onset   Cancer Mother        lung   Stroke Father    Stroke Brother    Colon cancer Neg Hx    SOCIAL HISTORY Social History   Tobacco Use   Smoking status: Former    Current packs/day: 0.00    Average packs/day: 0.3 packs/day for 4.0 years (1.0 ttl pk-yrs)    Types: Cigarettes    Start date: 11/04/1960    Quit date: 11/04/1964    Years since quitting: 58.8   Smokeless tobacco: Never  Vaping Use   Vaping status: Never Used  Substance Use Topics   Alcohol  use: No   Drug use: No       OPHTHALMIC EXAM:  Base Eye Exam     Visual Acuity (Snellen - Linear)       Right Left   Dist [place] 20/40 +2 20/30   Dist ph [place] 20/30 20/25         Tonometry (Tonopen, 10:37 AM)       Right Left   Pressure 09 09         Pupils       Dark Light Shape React APD   Right 3 2 Round Brisk None   Left 3 2 Round Brisk None         Visual Fields (Counting fingers)       Left Right    Full Full          Extraocular Movement       Right Left    Full, Ortho Full, Ortho         Neuro/Psych     Oriented x3: Yes   Mood/Affect: Normal         Dilation     Right eye: 1.0% Mydriacyl , 2.5% Phenylephrine  @ 10:35 AM  Patient wanted only the OD to be dilated. She is driving.  Slit Lamp and Fundus Exam     Slit Lamp Exam       Right Left   Lids/Lashes Dermatochalasis - upper lid Dermatochalasis - upper lid   Conjunctiva/Sclera White and quiet White and quiet   Cornea Arcus, well healed cataract wound Arcus, early nasal band K, 1+PEE   Anterior Chamber Deep, 1-2+ fine cell / pigment moderate depth, with narrow angle temporally   Iris Round and well dilated Round and well dilated   Lens PC IOL in good position PC IOL in good postition   Anterior Vitreous Vitreous syneresis, Posterior vitreous detachment, Weiss ring Vitreous syneresis         Fundus Exam       Right Left   Disc Pink and Sharp, Compact Pink and Sharp, Tilted disc   C/D Ratio 0.2 0.3   Macula Flat, blunted foveal reflex; central SRF - persistent, +drusen; +RPE mottling and clumping, small cluster of drusen and RPE mottling inferior fovea, no heme Flat, good foveal reflex, Retinal pigment epithelial mottling, superior para-foveal focal area of RPE atrophy -- stable, rare drusen, No heme or edema   Vessels attenuated, Tortuous Vascular attenuation, Tortuous   Periphery Attached, peripheral drusen / RPE changes nasally, peripheral cystoid degeneration Attached, very mild lattice at 0430, mild patch of Lattice degeneration at 0600 -- stable, scattered peripheral drusen, peripheral cystoid degeneration           IMAGING AND PROCEDURES  Imaging and Procedures for 12/11/17  OCT, Retina - OU - Both Eyes       Right Eye Quality was good. Central Foveal Thickness: 417. Progression has improved. Findings include no IRF, abnormal foveal contour, retinal drusen , pigment epithelial detachment, subretinal  fluid, outer retinal atrophy (Persistent central SRF -- slightly improved).   Left Eye Quality was good. Central Foveal Thickness: 273. Progression has been stable. Findings include normal foveal contour, no IRF, no SRF, retinal drusen .   Notes *Images captured and stored on drive  Diagnosis / Impression:  OD: Persistent central SRF -- slightly improved OS: NFP, No IRF/SRF  Clinical management:  See below  Abbreviations: NFP - Normal foveal profile. CME - cystoid macular edema. PED - pigment epithelial detachment. IRF - intraretinal fluid. SRF - subretinal fluid. EZ - ellipsoid zone. ERM - epiretinal membrane. ORA - outer retinal atrophy. ORT - outer retinal tubulation. SRHM - subretinal hyper-reflective material       Intravitreal Injection, Pharmacologic Agent - OD - Right Eye       Time Out 08/31/2023. 12:52 PM. Confirmed correct patient, procedure, site, and patient consented.   Anesthesia Topical anesthesia was used. Anesthetic medications included Lidocaine  2%, Proparacaine 0.5%.   Procedure Preparation included 5% betadine  to ocular surface, eyelid speculum. A (32g) needle was used.   Injection: 6 mg faricimab -svoa 6 MG/0.05ML   Route: Intravitreal, Site: Right Eye   NDC: 49757-903-98, Lot: [id number], Expiration date: 02/26/2024, Waste: 0 mL   Post-op Post injection exam found visual acuity of at least counting fingers. The patient tolerated the procedure well. There were no complications. The patient received written and verbal post procedure care education. Post injection medications were not given.   Notes **SAMPLE MEDICATION ADMINISTERED**           ASSESSMENT/PLAN:    ICD-10-CM   1. Exudative age-related macular degeneration of right eye with active choroidal neovascularization (HCC)  H35.3211 OCT, Retina - OU - Both Eyes    Intravitreal Injection, Pharmacologic Agent - OD - Right Eye  faricimab -svoa (VABYSMO ) 6mg /0.110mL intravitreal injection    2.  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 Essential hypertension  I10     4. Hypertensive retinopathy of both eyes  H35.033     5. 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 Posterior vitreous detachment of right eye  H43.811     7. Pseudophakia  Z96.1      1,2. Exudative age related macular degeneration vs CSCR OD  - FA (03.02.19) without leakage / pooling into area of SRF - review of systems reveals significant stressors in life, but no exogenous steroid use - S/P IVA #1 OD (09.27.19),  #2 (10.25.19), #3 (11.22.19) -- minimal response -- IVA resistance  - pt approved for [employer] and IVE  =============================== - S/P IVE OD #1 (12.20.19), #2 (01.20.20), #3 (02.18.20), #4 (07.20.20) #5 (09.01.20), #6 (10.13.20), #7 (11.20.20), #8 (01.20.21), #9 (02.17.21), #10 (03.19.21), #11 (04.20.21), #12 (05.25.21), #13 (6.23.21), #14 (07.27.21), #15 (08.30.21), #16 (9.28.21), #17 (10.26.21), #18 (11.29.21), #19 (01.11.22), #20 (02.08.22), #21 (3.8.22), #22 (04.05.22), #23 (5.10.22), #24 (06.21.22), #25 (8.2.22), #26 (9.20.22), #27 (11.15.22), #28 (01.17.23), #29 (03.29.23), #30 (06.21.23), #31 (09.27.23), #32 (01.16.24), #33 (05.07.24), #34 (06.18.24), #35 (07.30.24) -- IVE resistance - s/p IVV OD #1 (09.03.24), #2 (10.03.24), #3 (11.06.24), #4 (12.04.24)  - lost to f/u from Feb 2020 to July 2020 due to COVID-19 concerns - today, BCVA OD 20/25 -- stable  - OCT shows Persistent central SRF -- slightly improved **discussed decreased efficacy / resistance to Vabysmo  and potential benefit of switching medication**   - recommend IVV OD #5 (sample) today, 01.02.25 -- with follow up in 4 wks  - pt wishes to proceed with IVV OD  - RBA of procedure discussed, questions answered  - informed consent obtained   - Vabysmo  informed consent form signed and scanned on 09.03.24  - see procedure note  - Eylea4U benefits investigation initiated -- approved for 2024  - Vabysmo   approved for 2024  - will check authorization for EyleaHD  - f/u 4 weeks -- DFE/OCT/ possible injection  3,4. Hypertensive retinopathy OU  - discussed importance of tight BP control  - stable  - monitor  5. Lattice degeneration OS  - patch of inferior lattice OS -- no RT, holes or SRF  - asymptomatic   - discussed findings, prognosis, and treatment options including observation  - monitor  6. PVD / vitreous syneresis OD-   - Discussed findings and prognosis  - No RT or RD on 360 peripheral exam  - Reviewed s/s of RT/RD  - strict return precautions for any such RT/RD symptoms  7. Pseudophakia OU  - s/p CE/IOL OD (Dr. Juli, 04.26.24), OS 02/24/23  - IOL in good position, doing well  - monitor  Ophthalmic Meds Ordered this visit:  Meds ordered this encounter  Medications   faricimab -svoa (VABYSMO ) 6mg /0.25mL intravitreal injection     Return in about 4 weeks (around 09/28/2023) for f/u Ex. AMD OD, DFE, OCT, Possible, IVV, OD.  There are no Patient Instructions on file for this visit.   This document serves as a record of services personally performed by Redell JUDITHANN Hans, MD, PhD. It was created on their behalf by Auston Muzzy, COMT. The creation of this record is the provider's dictation and/or activities during the visit.  Electronically signed by: Auston Muzzy, COMT 08/31/23 6:37 PM  This document serves as a record of services personally performed by Redell JUDITHANN Hans,  MD, PhD. It was created on their behalf by Wanda GEANNIE Keens, COT an ophthalmic technician. The creation of this record is the provider's dictation and/or activities during the visit.    Electronically signed by:  Wanda GEANNIE Keens, COT  08/31/23 6:37 PM  Redell JUDITHANN Hans, M.D., Ph.D. Diseases & Surgery of the Retina and Vitreous [facility]  I have reviewed the above documentation for accuracy and completeness, and I agree with the above. Redell JUDITHANN Hans, M.D., Ph.D. 08/31/23 6:39  PM   Abbreviations: M myopia (nearsighted); A astigmatism; H hyperopia (farsighted); P presbyopia; Mrx spectacle prescription;  CTL contact lenses; OD right eye; OS left eye; OU both eyes  XT exotropia; ET esotropia; PEK punctate epithelial keratitis; PEE punctate epithelial erosions; DES dry eye syndrome; MGD meibomian gland dysfunction; ATs artificial tears; PFAT's preservative free artificial tears; NSC nuclear sclerotic cataract; PSC posterior subcapsular cataract; ERM epi-retinal membrane; PVD posterior vitreous detachment; RD retinal detachment; DM diabetes mellitus; DR diabetic retinopathy; NPDR non-proliferative diabetic retinopathy; PDR proliferative diabetic retinopathy; CSME clinically significant macular edema; DME diabetic macular edema; dbh dot blot hemorrhages; CWS cotton wool spot; POAG primary open angle glaucoma; C/D cup-to-disc ratio; HVF humphrey visual field; GVF goldmann visual field; OCT optical coherence tomography; IOP intraocular pressure; BRVO Branch retinal vein occlusion; CRVO central retinal vein occlusion; CRAO central retinal artery occlusion; BRAO branch retinal artery occlusion; RT retinal tear; SB scleral buckle; PPV pars plana vitrectomy; VH Vitreous hemorrhage; PRP panretinal laser photocoagulation; IVK intravitreal kenalog; VMT vitreomacular traction; MH Macular hole;  NVD neovascularization of the disc; NVE neovascularization elsewhere; AREDS age related eye disease study; ARMD age related macular degeneration; POAG primary open angle glaucoma; EBMD epithelial/anterior basement membrane dystrophy; ACIOL anterior chamber intraocular lens; IOL intraocular lens; PCIOL posterior chamber intraocular lens; Phaco/IOL phacoemulsification with intraocular lens placement; PRK photorefractive keratectomy; LASIK laser assisted in situ keratomileusis; HTN hypertension; DM diabetes mellitus; COPD chronic obstructive pulmonary disease

## 2023-08-31 ENCOUNTER — Encounter (INDEPENDENT_AMBULATORY_CARE_PROVIDER_SITE_OTHER): Payer: Self-pay | Admitting: Ophthalmology

## 2023-08-31 ENCOUNTER — Ambulatory Visit (INDEPENDENT_AMBULATORY_CARE_PROVIDER_SITE_OTHER): Payer: Medicare HMO | Admitting: Ophthalmology

## 2023-08-31 DIAGNOSIS — H35412 Lattice degeneration of retina, left eye: Secondary | ICD-10-CM

## 2023-08-31 DIAGNOSIS — H353211 Exudative age-related macular degeneration, right eye, with active choroidal neovascularization: Secondary | ICD-10-CM | POA: Diagnosis not present

## 2023-08-31 DIAGNOSIS — Z961 Presence of intraocular lens: Secondary | ICD-10-CM

## 2023-08-31 DIAGNOSIS — H35711 Central serous chorioretinopathy, right eye: Secondary | ICD-10-CM

## 2023-08-31 DIAGNOSIS — H35033 Hypertensive retinopathy, bilateral: Secondary | ICD-10-CM | POA: Diagnosis not present

## 2023-08-31 DIAGNOSIS — I10 Essential (primary) hypertension: Secondary | ICD-10-CM

## 2023-08-31 DIAGNOSIS — H43811 Vitreous degeneration, right eye: Secondary | ICD-10-CM

## 2023-08-31 MED ORDER — FARICIMAB-SVOA 6 MG/0.05ML IZ SOLN
6.0000 mg | INTRAVITREAL | Status: AC | PRN
Start: 2023-08-31 — End: 2023-08-31
  Administered 2023-08-31: 6 mg via INTRAVITREAL

## 2023-09-28 NOTE — Progress Notes (Signed)
[facility] - Clinic Note  09/29/2023     CHIEF COMPLAINT Patient presents for Retina Follow Up  HISTORY OF PRESENT ILLNESS: Darlene Crawford is a 83 y.o. 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  This started 4 weeks ago.  Duration of 4 weeks.  Since onset it is stable.  I, the attending physician,  performed the HPI with the patient and updated documentation appropriately.        Comments   4 week retina follow up IVV OD pt is reporting vision been little blurred see Dr Ilsa Iha March 4th about yag pt denies any flashes or floaters       Last edited by Rennis Chris, MD on 09/29/2023  3:21 PM.    Pt feels like her vision is down, she has an appt with Dr. Ilsa Iha in March, she thinks a film has developed on the back of her lens   Referring physician: Wendall Papa [address] [place],  [place] [address]  HISTORICAL INFORMATION:  Selected notes from the MEDICAL RECORD NUMBER Referred by Dr. Monico Blitz for concern of ARMD OD with progression to CNVM vs CSR   CURRENT MEDICATIONS: Current Outpatient Medications (Ophthalmic Drugs)  Medication Sig   ARTIFICIAL TEAR SOLUTION OP Place 1 drop into both eyes daily as needed (dry eyes).   Current Facility-Administered Medications (Ophthalmic Drugs)  Medication Route   aflibercept (EYLEA) SOLN 2 mg Intravitreal   aflibercept (EYLEA) SOLN 2 mg Intravitreal   aflibercept (EYLEA) SOLN 2 mg Intravitreal   Current Outpatient Medications (Other)  Medication Sig   acidophilus (RISAQUAD) CAPS capsule Take 1 capsule by mouth daily.   atorvastatin (LIPITOR) 10 MG tablet Take 10 mg by mouth at bedtime.    Cholecalciferol (VITAMIN D) 50 MCG (2000 UT) tablet Take 4,000 Units by mouth at bedtime.    Cyanocobalamin (B-12 PO) Take 1 Dose by mouth 2 (two) times a week.   fluticasone (FLONASE) 50 MCG/ACT nasal spray Place 1 spray into both nostrils  daily as needed for allergies or rhinitis.   hydrochlorothiazide (HYDRODIURIL) 25 MG tablet Take 25 mg by mouth daily. 1/2 tablet am, 1/2 tablet pm   levothyroxine (SYNTHROID, LEVOTHROID) 100 MCG tablet Take 100 mcg by mouth daily before breakfast.   loratadine (CLARITIN) 10 MG tablet Take 10 mg by mouth daily as needed for allergies.   losartan (COZAAR) 100 MG tablet Take 100 mg by mouth daily.    Melatonin 10 MG CAPS Take 10 mg by mouth at bedtime.   Multiple Vitamins-Minerals (PRESERVISION AREDS 2 PO) Take 1 tablet by mouth at bedtime.    sertraline (ZOLOFT) 50 MG tablet Take 50 mg by mouth daily. (Patient not taking: Reported on 08/31/2023)   sodium chloride (OCEAN) 0.65 % SOLN nasal spray Place 1 spray into both nostrils as needed for congestion.   Current Facility-Administered Medications (Other)  Medication Route   Bevacizumab (AVASTIN) SOLN 1.25 mg Intravitreal   Bevacizumab (AVASTIN) SOLN 1.25 mg Intravitreal   Bevacizumab (AVASTIN) SOLN 1.25 mg Intravitreal   REVIEW OF SYSTEMS: ROS   Positive for: Genitourinary, Musculoskeletal, Endocrine, Eyes, Psychiatric, Allergic/Imm Negative for: Constitutional, Gastrointestinal, Neurological, Skin, HENT, Cardiovascular, Respiratory, Heme/Lymph Last edited by Etheleen Mayhew, COT on 09/29/2023  1:03 PM.      ALLERGIES Allergies  Allergen Reactions   Trilyte [Peg 3350-Kcl-Na Bicarb-Nacl]  HEMATEMESIS   Vicodin [Hydrocodone-Acetaminophen] Nausea And Vomiting   PAST MEDICAL HISTORY Past Medical History:  Diagnosis Date   Anxiety and depression    Cataract    Hypercholesterolemia    Hypertension    Hypertensive retinopathy    OU   Hypothyroidism    Macular degeneration    OD   Osteoarthritis    PONV (postoperative nausea and vomiting)    Seasonal allergies    Past Surgical History:  Procedure Laterality Date   BIOPSY  10/01/2018   Procedure: BIOPSY;  Surgeon: West Bali, MD;  Location: [facility];  Service:  Endoscopy;;  gastric bx & gastric polyp   CATARACT EXTRACTION W/PHACO Right 12/23/2022   Procedure: CATARACT EXTRACTION PHACO AND INTRAOCULAR LENS PLACEMENT (IOC);  Surgeon: Pecolia Ades, MD;  Location: [facility];  Service: Ophthalmology;  Laterality: Right;  CDE: 11.88   CATARACT EXTRACTION W/PHACO Left 02/24/2023   Procedure: CATARACT EXTRACTION PHACO AND INTRAOCULAR LENS PLACEMENT (IOC);  Surgeon: Pecolia Ades, MD;  Location: [facility];  Service: Ophthalmology;  Laterality: Left;  CDE  5.82   COLONOSCOPY N/A 10/01/2018   Procedure: COLONOSCOPY;  Surgeon: West Bali, MD;  Location: [facility];  Service: Endoscopy;  Laterality: N/A;  10:30am   ESOPHAGOGASTRODUODENOSCOPY N/A 10/01/2018   Procedure: ESOPHAGOGASTRODUODENOSCOPY (EGD);  Surgeon: West Bali, MD;  Location: [facility];  Service: Endoscopy;  Laterality: N/A;   POLYPECTOMY  10/01/2018   Procedure: POLYPECTOMY;  Surgeon: West Bali, MD;  Location: [facility];  Service: Endoscopy;;  colon    TUBAL LIGATION     UTERINE FIBROID SURGERY     FAMILY HISTORY Family History  Problem Relation Age of Onset   Cancer Mother        lung   Stroke Father    Stroke Brother    Colon cancer Neg Hx    SOCIAL HISTORY Social History   Tobacco Use   Smoking status: Former    Current packs/day: 0.00    Average packs/day: 0.3 packs/day for 4.0 years (1.0 ttl pk-yrs)    Types: Cigarettes    Start date: 11/04/1960    Quit date: 11/04/1964    Years since quitting: 58.9   Smokeless tobacco: Never  Vaping Use   Vaping status: Never Used  Substance Use Topics   Alcohol use: No   Drug use: No       OPHTHALMIC EXAM:  Base Eye Exam     Visual Acuity (Snellen - Linear)       Right Left   Dist [place] 20/40 20/30 -2   Dist ph [place] NI NI         Tonometry (Tonopen, 1:07 PM)       Right Left   Pressure 15 12         Pupils       Pupils Dark Light Shape React APD   Right PERRL 3 2 Round Brisk None   Left PERRL 3 2  Round Brisk None         Visual Fields       Left Right    Full Full         Extraocular Movement       Right Left    Full, Ortho Full, Ortho         Neuro/Psych     Oriented x3: Yes   Mood/Affect: Normal         Dilation     Right eye: 2.5% Phenylephrine @  1:07 PM           Slit Lamp and Fundus Exam     Slit Lamp Exam       Right Left   Lids/Lashes Dermatochalasis - upper lid Dermatochalasis - upper lid   Conjunctiva/Sclera White and quiet White and quiet   Cornea Arcus, well healed cataract wound Arcus, early nasal band K, 1+PEE   Anterior Chamber Deep, 1-2+ fine cell / pigment moderate depth, with narrow angle temporally   Iris Round and well dilated Round and well dilated   Lens PC IOL in good position PC IOL in good postition   Anterior Vitreous Vitreous syneresis, Posterior vitreous detachment, Weiss ring Vitreous syneresis         Fundus Exam       Right Left   Disc Pink and Sharp, Compact Pink and Sharp, Tilted disc   C/D Ratio 0.2 0.3   Macula Flat, blunted foveal reflex; central SRF - slightly improved, +drusen; +RPE mottling and clumping, small cluster of drusen and RPE mottling inferior fovea, no heme Flat, good foveal reflex, Retinal pigment epithelial mottling, superior para-foveal focal area of RPE atrophy -- stable, rare drusen, No heme or edema   Vessels attenuated, Tortuous Vascular attenuation, Tortuous   Periphery Attached, peripheral drusen / RPE changes nasally, peripheral cystoid degeneration Attached, very mild lattice at 0430, mild patch of Lattice degeneration at 0600 -- stable, scattered peripheral drusen, peripheral cystoid degeneration           IMAGING AND PROCEDURES  Imaging and Procedures for 12/11/17  OCT, Retina - OU - Both Eyes       Right Eye Quality was good. Central Foveal Thickness: 390. Progression has improved. Findings include no IRF, abnormal foveal contour, retinal drusen , pigment epithelial  detachment, subretinal fluid, outer retinal atrophy (Persistent central and inferior SRF -- slightly improved).   Left Eye Quality was good. Central Foveal Thickness: 273. Progression has been stable. Findings include normal foveal contour, no IRF, no SRF, retinal drusen .   Notes *Images captured and stored on drive  Diagnosis / Impression:  OD: Persistent central and inferior SRF -- slightly improved OS: NFP, No IRF/SRF  Clinical management:  See below  Abbreviations: NFP - Normal foveal profile. CME - cystoid macular edema. PED - pigment epithelial detachment. IRF - intraretinal fluid. SRF - subretinal fluid. EZ - ellipsoid zone. ERM - epiretinal membrane. ORA - outer retinal atrophy. ORT - outer retinal tubulation. SRHM - subretinal hyper-reflective material       Intravitreal Injection, Pharmacologic Agent - OD - Right Eye       Time Out 09/29/2023. 1:58 PM. Confirmed correct patient, procedure, site, and patient consented.   Anesthesia Topical anesthesia was used. Anesthetic medications included Lidocaine 2%, Proparacaine 0.5%.   Procedure Preparation included 5% betadine to ocular surface, eyelid speculum. A (32g) needle was used.   Injection: 6 mg faricimab-svoa 6 MG/0.05ML (Patient supplied)   Route: Intravitreal, Site: Right Eye   NDC: 16109-604-54, Lot: [id number], Expiration date: 04/27/2025, Waste: 0 mL   Post-op Post injection exam found visual acuity of at least counting fingers. The patient tolerated the procedure well. There were no complications. The patient received written and verbal post procedure care education. Post injection medications were not given.   Notes **SAMPLE MEDICATION ADMINISTERED**           ASSESSMENT/PLAN:    ICD-10-CM   1. Exudative age-related macular degeneration of right eye with active choroidal neovascularization (HCC)  H35.3211  OCT, Retina - OU - Both Eyes    Intravitreal Injection, Pharmacologic Agent - OD - Right Eye     faricimab-svoa (VABYSMO) 6mg /0.47mL intravitreal injection    2. 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 Essential hypertension  I10     4. Hypertensive retinopathy of both eyes  H35.033     5. 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 Posterior vitreous detachment of right eye  H43.811     7. Pseudophakia  Z96.1       1,2. Exudative age related macular degeneration vs CSCR OD  - FA (03.02.19) without leakage / pooling into area of SRF - review of systems reveals significant stressors in life, but no exogenous steroid use - S/P IVA #1 OD (09.27.19),  #2 (10.25.19), #3 (11.22.19) -- minimal response -- IVA resistance  - pt approved for [employer] and IVE  =============================== - S/P IVE OD #1 (12.20.19), #2 (01.20.20), #3 (02.18.20), #4 (07.20.20) #5 (09.01.20), #6 (10.13.20), #7 (11.20.20), #8 (01.20.21), #9 (02.17.21), #10 (03.19.21), #11 (04.20.21), #12 (05.25.21), #13 (6.23.21), #14 (07.27.21), #15 (08.30.21), #16 (9.28.21), #17 (10.26.21), #18 (11.29.21), #19 (01.11.22), #20 (02.08.22), #21 (3.8.22), #22 (04.05.22), #23 (5.10.22), #24 (06.21.22), #25 (8.2.22), #26 (9.20.22), #27 (11.15.22), #28 (01.17.23), #29 (03.29.23), #30 (06.21.23), #31 (09.27.23), #32 (01.16.24), #33 (05.07.24), #34 (06.18.24), #35 (07.30.24) -- IVE resistance - s/p IVV OD #1 (09.03.24), #2 (10.03.24), #3 (11.06.24), #4 (12.04.24) #5(01.02.25)  - lost to f/u from Feb 2020 to July 2020 due to COVID-19 concerns - today, BCVA OD 20/25 -- stable - OCT shows Persistent central SRF -- slightly improved  - recommend IVV OD #6 (sample) today, 01.31.25 -- with follow up in 4 wks  - [employer] funding unavailable  - pt wishes to proceed with IVV OD  - RBA of procedure discussed, questions answered  - informed consent obtained   - Vabysmo informed consent form signed and scanned on 09.03.24  - see procedure note  - Eylea4U benefits investigation initiated -- approved for  2025  - Vabysmo approved for 2025   - no [employer] funding available  - EyleaHD approved for 2025  - f/u 4 weeks -- DFE/OCT/ possible injection  3,4. Hypertensive retinopathy OU  - discussed importance of tight BP control  - stable  - monitor  5. Lattice degeneration OS  - patch of inferior lattice OS -- no RT, holes or SRF  - asymptomatic   - discussed findings, prognosis, and treatment options including observation  - monitor  6. PVD / vitreous syneresis OD-   - Discussed findings and prognosis  - No RT or RD on 360 peripheral exam  - Reviewed s/s of RT/RD  - strict return precautions for any such RT/RD symptoms  7. Pseudophakia OU  - s/p CE/IOL OD (Dr. Ilsa Iha, 04.26.24), OS 02/24/23  - IOL in good position, doing well  - monitor  Ophthalmic Meds Ordered this visit:  Meds ordered this encounter  Medications   faricimab-svoa (VABYSMO) 6mg /0.40mL intravitreal injection     Return in about 4 weeks (around 10/27/2023) for f/u exu ARMD OD, DFE, OCT.  There are no Patient Instructions on file for this visit.   Electronically signed by: Berlin Hun COT TODAY 01.31.25 1:58 AM.  This document serves as a record of services personally performed by Karie Chimera, MD, PhD. It was created on their behalf by Glee Arvin. Manson Passey, OA an ophthalmic technician. The creation of this record  is the provider's dictation and/or activities during the visit.    Electronically signed by: Glee Arvin. Manson Passey, OA 09/30/23 1:58 AM  Karie Chimera, M.D., Ph.D. Diseases & Surgery of the Retina and Vitreous [facility] 09/29/2023   I have reviewed the above documentation for accuracy and completeness, and I agree with the above. Karie Chimera, M.D., Ph.D. 09/30/23 1:59 AM   Abbreviations: M myopia (nearsighted); A astigmatism; H hyperopia (farsighted); P presbyopia; Mrx spectacle prescription;  CTL contact lenses; OD right eye; OS left eye; OU both eyes  XT exotropia;  ET esotropia; PEK punctate epithelial keratitis; PEE punctate epithelial erosions; DES dry eye syndrome; MGD meibomian gland dysfunction; ATs artificial tears; PFAT's preservative free artificial tears; NSC nuclear sclerotic cataract; PSC posterior subcapsular cataract; ERM epi-retinal membrane; PVD posterior vitreous detachment; RD retinal detachment; DM diabetes mellitus; DR diabetic retinopathy; NPDR non-proliferative diabetic retinopathy; PDR proliferative diabetic retinopathy; CSME clinically significant macular edema; DME diabetic macular edema; dbh dot blot hemorrhages; CWS cotton wool spot; POAG primary open angle glaucoma; C/D cup-to-disc ratio; HVF humphrey visual field; GVF goldmann visual field; OCT optical coherence tomography; IOP intraocular pressure; BRVO Branch retinal vein occlusion; CRVO central retinal vein occlusion; CRAO central retinal artery occlusion; BRAO branch retinal artery occlusion; RT retinal tear; SB scleral buckle; PPV pars plana vitrectomy; VH Vitreous hemorrhage; PRP panretinal laser photocoagulation; IVK intravitreal kenalog; VMT vitreomacular traction; MH Macular hole;  NVD neovascularization of the disc; NVE neovascularization elsewhere; AREDS age related eye disease study; ARMD age related macular degeneration; POAG primary open angle glaucoma; EBMD epithelial/anterior basement membrane dystrophy; ACIOL anterior chamber intraocular lens; IOL intraocular lens; PCIOL posterior chamber intraocular lens; Phaco/IOL phacoemulsification with intraocular lens placement; PRK photorefractive keratectomy; LASIK laser assisted in situ keratomileusis; HTN hypertension; DM diabetes mellitus; COPD chronic obstructive pulmonary disease

## 2023-09-29 ENCOUNTER — Ambulatory Visit (INDEPENDENT_AMBULATORY_CARE_PROVIDER_SITE_OTHER): Payer: Medicare HMO | Admitting: Ophthalmology

## 2023-09-29 ENCOUNTER — Encounter (INDEPENDENT_AMBULATORY_CARE_PROVIDER_SITE_OTHER): Payer: Self-pay | Admitting: Ophthalmology

## 2023-09-29 DIAGNOSIS — I10 Essential (primary) hypertension: Secondary | ICD-10-CM

## 2023-09-29 DIAGNOSIS — Z961 Presence of intraocular lens: Secondary | ICD-10-CM | POA: Diagnosis not present

## 2023-09-29 DIAGNOSIS — H35033 Hypertensive retinopathy, bilateral: Secondary | ICD-10-CM | POA: Diagnosis not present

## 2023-09-29 DIAGNOSIS — H353211 Exudative age-related macular degeneration, right eye, with active choroidal neovascularization: Secondary | ICD-10-CM

## 2023-09-29 DIAGNOSIS — H35412 Lattice degeneration of retina, left eye: Secondary | ICD-10-CM

## 2023-09-29 DIAGNOSIS — H35711 Central serous chorioretinopathy, right eye: Secondary | ICD-10-CM | POA: Diagnosis not present

## 2023-09-29 DIAGNOSIS — H43811 Vitreous degeneration, right eye: Secondary | ICD-10-CM | POA: Diagnosis not present

## 2023-09-29 MED ORDER — FARICIMAB-SVOA 6 MG/0.05ML IZ SOLN
6.0000 mg | INTRAVITREAL | Status: AC | PRN
Start: 2023-09-29 — End: 2023-09-29
  Administered 2023-09-29: 6 mg via INTRAVITREAL

## 2023-10-12 DIAGNOSIS — N390 Urinary tract infection, site not specified: Secondary | ICD-10-CM | POA: Diagnosis not present

## 2023-10-26 NOTE — Progress Notes (Signed)
 [facility] - Clinic Note  10/27/2023     CHIEF COMPLAINT Patient presents for Retina Follow Up  HISTORY OF PRESENT ILLNESS: Darlene Crawford is a 83 y.o. 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  This started 4 weeks ago.  I, the attending physician,  performed the HPI with the patient and updated documentation appropriately.        Comments   Patient here for 4 weeks retina follow up for exu ARMD OD. Patient states vision not that great OS. Had cataract surgery and now it is cloudy. Seeing surgeon coming up and talk to him about it. OD may be a little bit better. Hard to tell. Uses AT prn for dry eyes.       Last edited by Rennis Chris, MD on 10/27/2023 12:51 PM.    Pt states there's not a lot of change in her vision, she has an appt with Dr. Ilsa Iha on Tuesday  Referring physician: Wendall Papa [address] [place],  [place] [address]  HISTORICAL INFORMATION:  Selected notes from the MEDICAL RECORD NUMBER Referred by Dr. Monico Blitz for concern of ARMD OD with progression to CNVM vs CSR   CURRENT MEDICATIONS: Current Outpatient Medications (Ophthalmic Drugs)  Medication Sig   ARTIFICIAL TEAR SOLUTION OP Place 1 drop into both eyes daily as needed (dry eyes).   Current Facility-Administered Medications (Ophthalmic Drugs)  Medication Route   aflibercept (EYLEA) SOLN 2 mg Intravitreal   aflibercept (EYLEA) SOLN 2 mg Intravitreal   aflibercept (EYLEA) SOLN 2 mg Intravitreal   Current Outpatient Medications (Other)  Medication Sig   atorvastatin (LIPITOR) 10 MG tablet Take 10 mg by mouth at bedtime.    Cholecalciferol (VITAMIN D) 50 MCG (2000 UT) tablet Take 4,000 Units by mouth at bedtime.    Cyanocobalamin (B-12 PO) Take 1 Dose by mouth 2 (two) times a week.   fluticasone (FLONASE) 50 MCG/ACT nasal spray Place 1 spray into both nostrils daily as needed for allergies or  rhinitis.   levothyroxine (SYNTHROID, LEVOTHROID) 100 MCG tablet Take 100 mcg by mouth daily before breakfast.   loratadine (CLARITIN) 10 MG tablet Take 10 mg by mouth daily as needed for allergies.   losartan (COZAAR) 100 MG tablet Take 100 mg by mouth daily.    Melatonin 10 MG CAPS Take 10 mg by mouth at bedtime.   Multiple Vitamins-Minerals (PRESERVISION AREDS 2 PO) Take 1 tablet by mouth at bedtime.    sodium chloride (OCEAN) 0.65 % SOLN nasal spray Place 1 spray into both nostrils as needed for congestion.   acidophilus (RISAQUAD) CAPS capsule Take 1 capsule by mouth daily. (Patient not taking: Reported on 10/27/2023)   hydrochlorothiazide (HYDRODIURIL) 25 MG tablet Take 25 mg by mouth daily. 1/2 tablet am, 1/2 tablet pm (Patient not taking: Reported on 10/27/2023)   sertraline (ZOLOFT) 50 MG tablet Take 50 mg by mouth daily. (Patient not taking: Reported on 08/31/2023)   Current Facility-Administered Medications (Other)  Medication Route   Bevacizumab (AVASTIN) SOLN 1.25 mg Intravitreal   Bevacizumab (AVASTIN) SOLN 1.25 mg Intravitreal   Bevacizumab (AVASTIN) SOLN 1.25 mg Intravitreal   REVIEW OF SYSTEMS: ROS   Positive for: Genitourinary, Musculoskeletal, Endocrine, Eyes, Psychiatric, Allergic/Imm Negative for: Constitutional, Gastrointestinal, Neurological, Skin, HENT, Cardiovascular, Respiratory, Heme/Lymph Last edited by Laddie Aquas, COA on 10/27/2023  9:56 AM.  ALLERGIES Allergies  Allergen Reactions   Trilyte [Peg 3350-Kcl-Na Bicarb-Nacl]     HEMATEMESIS   Vicodin [Hydrocodone-Acetaminophen] Nausea And Vomiting   PAST MEDICAL HISTORY Past Medical History:  Diagnosis Date   Anxiety and depression    Cataract    Hypercholesterolemia    Hypertension    Hypertensive retinopathy    OU   Hypothyroidism    Macular degeneration    OD   Osteoarthritis    PONV (postoperative nausea and vomiting)    Seasonal allergies    Past Surgical History:  Procedure  Laterality Date   BIOPSY  10/01/2018   Procedure: BIOPSY;  Surgeon: West Bali, MD;  Location: [facility];  Service: Endoscopy;;  gastric bx & gastric polyp   CATARACT EXTRACTION W/PHACO Right 12/23/2022   Procedure: CATARACT EXTRACTION PHACO AND INTRAOCULAR LENS PLACEMENT (IOC);  Surgeon: Pecolia Ades, MD;  Location: [facility];  Service: Ophthalmology;  Laterality: Right;  CDE: 11.88   CATARACT EXTRACTION W/PHACO Left 02/24/2023   Procedure: CATARACT EXTRACTION PHACO AND INTRAOCULAR LENS PLACEMENT (IOC);  Surgeon: Pecolia Ades, MD;  Location: [facility];  Service: Ophthalmology;  Laterality: Left;  CDE  5.82   COLONOSCOPY N/A 10/01/2018   Procedure: COLONOSCOPY;  Surgeon: West Bali, MD;  Location: [facility];  Service: Endoscopy;  Laterality: N/A;  10:30am   ESOPHAGOGASTRODUODENOSCOPY N/A 10/01/2018   Procedure: ESOPHAGOGASTRODUODENOSCOPY (EGD);  Surgeon: West Bali, MD;  Location: [facility];  Service: Endoscopy;  Laterality: N/A;   POLYPECTOMY  10/01/2018   Procedure: POLYPECTOMY;  Surgeon: West Bali, MD;  Location: [facility];  Service: Endoscopy;;  colon    TUBAL LIGATION     UTERINE FIBROID SURGERY     FAMILY HISTORY Family History  Problem Relation Age of Onset   Cancer Mother        lung   Stroke Father    Stroke Brother    Colon cancer Neg Hx    SOCIAL HISTORY Social History   Tobacco Use   Smoking status: Former    Current packs/day: 0.00    Average packs/day: 0.3 packs/day for 4.0 years (1.0 ttl pk-yrs)    Types: Cigarettes    Start date: 11/04/1960    Quit date: 11/04/1964    Years since quitting: 59.0   Smokeless tobacco: Never  Vaping Use   Vaping status: Never Used  Substance Use Topics   Alcohol use: No   Drug use: No       OPHTHALMIC EXAM:  Base Eye Exam     Visual Acuity (Snellen - Linear)       Right Left   Dist [place] 20/40 20/30 -2   Dist ph [place] 20/40 +1 20/25 -1         Tonometry (Tonopen, 9:52 AM)       Right Left    Pressure 11 09         Pupils       Dark Light Shape React APD   Right 3 2 Round Brisk None   Left 3 2 Round Brisk None         Visual Fields (Counting fingers)       Left Right    Full Full         Extraocular Movement       Right Left    Full, Ortho Full, Ortho         Neuro/Psych     Oriented x3: Yes   Mood/Affect: Normal  Dilation     Right eye: 1.0% Mydriacyl, 2.5% Phenylephrine @ 9:52 AM  Patient wanted OD only dilated-driving.          Slit Lamp and Fundus Exam     Slit Lamp Exam       Right Left   Lids/Lashes Dermatochalasis - upper lid Dermatochalasis - upper lid   Conjunctiva/Sclera White and quiet White and quiet   Cornea Arcus, well healed cataract wound Arcus, early nasal band K, 1+PEE   Anterior Chamber Deep, 1-2+ fine cell / pigment moderate depth, with narrow angle temporally   Iris Round and well dilated Round and well dilated   Lens PC IOL in good position PC IOL in good postition   Anterior Vitreous Vitreous syneresis, Posterior vitreous detachment, Weiss ring Vitreous syneresis         Fundus Exam       Right Left   Disc Pink and Sharp, Compact Pink and Sharp, Tilted disc   C/D Ratio 0.2 0.3   Macula Flat, blunted foveal reflex; central SRF, +drusen; +RPE mottling and clumping, small cluster of drusen and RPE mottling inferior fovea, no heme Flat, good foveal reflex, Retinal pigment epithelial mottling, superior para-foveal focal area of RPE atrophy -- stable, rare drusen, No heme or edema   Vessels attenuated, Tortuous Vascular attenuation, Tortuous   Periphery Attached, peripheral drusen / RPE changes nasally, peripheral cystoid degeneration Attached, very mild lattice at 0430, mild patch of Lattice degeneration at 0600 -- stable, scattered peripheral drusen, peripheral cystoid degeneration           IMAGING AND PROCEDURES  Imaging and Procedures for 12/11/17  OCT, Retina - OU - Both Eyes       Right  Eye Quality was good. Central Foveal Thickness: 385. Progression has been stable. Findings include no IRF, abnormal foveal contour, retinal drusen , pigment epithelial detachment, subretinal fluid, outer retinal atrophy (Persistent central and inferior SRF ).   Left Eye Quality was good. Central Foveal Thickness: 278. Progression has been stable. Findings include normal foveal contour, no IRF, no SRF, retinal drusen .   Notes *Images captured and stored on drive  Diagnosis / Impression:  OD: Persistent central and inferior SRF OS: NFP, No IRF/SRF  Clinical management:  See below  Abbreviations: NFP - Normal foveal profile. CME - cystoid macular edema. PED - pigment epithelial detachment. IRF - intraretinal fluid. SRF - subretinal fluid. EZ - ellipsoid zone. ERM - epiretinal membrane. ORA - outer retinal atrophy. ORT - outer retinal tubulation. SRHM - subretinal hyper-reflective material       Intravitreal Injection, Pharmacologic Agent - OD - Right Eye       Time Out 10/27/2023. 11:51 AM. Confirmed correct patient, procedure, site, and patient consented.   Anesthesia Topical anesthesia was used. Anesthetic medications included Lidocaine 2%, Proparacaine 0.5%.   Procedure Preparation included 5% betadine to ocular surface, eyelid speculum. A (32g) needle was used.   Injection: 6 mg faricimab-svoa 6 MG/0.05ML (Patient supplied)   Route: Intravitreal, Site: Right Eye   NDC: 29562-130-86, Lot: [id number], Expiration date: 04/28/2025, Waste: 0 mL   Post-op Post injection exam found visual acuity of at least counting fingers. The patient tolerated the procedure well. There were no complications. The patient received written and verbal post procedure care education. Post injection medications were not given.   Notes **SAMPLE MEDICATION ADMINISTERED**           ASSESSMENT/PLAN:    ICD-10-CM   1. Exudative age-related macular degeneration  of right eye with active choroidal  neovascularization (HCC)  H35.3211 OCT, Retina - OU - Both Eyes    Intravitreal Injection, Pharmacologic Agent - OD - Right Eye    faricimab-svoa (VABYSMO) 6mg /0.36mL intravitreal injection    2. 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 Essential hypertension  I10     4. Hypertensive retinopathy of both eyes  H35.033     5. 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 Posterior vitreous detachment of right eye  H43.811     7. Pseudophakia  Z96.1      1,2. Exudative age related macular degeneration vs CSCR OD  - FA (03.02.19) without leakage / pooling into area of SRF - review of systems reveals significant stressors in life, but no exogenous steroid use - S/P IVA #1 OD (09.27.19),  #2 (10.25.19), #3 (11.22.19) -- minimal response -- IVA resistance  - pt approved for [employer] and IVE  =============================== - S/P IVE OD #1 (12.20.19), #2 (01.20.20), #3 (02.18.20), #4 (07.20.20) #5 (09.01.20), #6 (10.13.20), #7 (11.20.20), #8 (01.20.21), #9 (02.17.21), #10 (03.19.21), #11 (04.20.21), #12 (05.25.21), #13 (6.23.21), #14 (07.27.21), #15 (08.30.21), #16 (9.28.21), #17 (10.26.21), #18 (11.29.21), #19 (01.11.22), #20 (02.08.22), #21 (3.8.22), #22 (04.05.22), #23 (5.10.22), #24 (06.21.22), #25 (8.2.22), #26 (9.20.22), #27 (11.15.22), #28 (01.17.23), #29 (03.29.23), #30 (06.21.23), #31 (09.27.23), #32 (01.16.24), #33 (05.07.24), #34 (06.18.24), #35 (07.30.24) -- IVE resistance - s/p IVV OD #1 (09.03.24), #2 (10.03.24), #3 (11.06.24), #4 (12.04.24) #5(01.02.25) #6 (01.31.25 -- sample)  - lost to f/u from Feb 2020 to July 2020 due to COVID-19 concerns - today, BCVA OD 20/40 - stable - OCT shows Persistent central SRF -- slightly improved  - recommend IVV OD #7 (sample) today, 02.28.25 -- with follow up in 4 wks  - [employer] funding unavailable  - pt wishes to proceed with IVV OD sample  - RBA of procedure discussed, questions answered  - informed consent  obtained   - Vabysmo informed consent form signed and scanned on 09.03.24  - see procedure note  - Eylea4U benefits investigation initiated -- approved for 2025  - Vabysmo approved for 2025   - no [employer] funding available  - EyleaHD approved for 2025  - f/u 4 weeks -- DFE/OCT/ possible injection  3,4. Hypertensive retinopathy OU  - discussed importance of tight BP control  - stable  - monitor  5. Lattice degeneration OS  - patch of inferior lattice OS -- no RT, holes or SRF  - asymptomatic   - discussed findings, prognosis, and treatment options including observation  - monitor  6. PVD / vitreous syneresis OD-   - Discussed findings and prognosis  - No RT or RD on 360 peripheral exam  - Reviewed s/s of RT/RD  - strict return precautions for any such RT/RD symptoms  7. Pseudophakia OU  - s/p CE/IOL OD (Dr. Ilsa Iha, 04.26.24, OS 02/24/23)  - IOL in good position, doing well  - monitor  Ophthalmic Meds Ordered this visit:  Meds ordered this encounter  Medications   faricimab-svoa (VABYSMO) 6mg /0.48mL intravitreal injection     Return in about 4 weeks (around 11/24/2023) for f/u exu ARMD OD, DFE, OCT, Possible Injxn.  There are no Patient Instructions on file for this visit.  This document serves as a record of services personally performed by Karie Chimera, MD, PhD. It was created on their behalf by Berlin Hun COT, an ophthalmic technician. The creation  of this record is the provider's dictation and/or activities during the visit.    Electronically signed by: Berlin Hun COT 02.27.25 12:00 AM  This document serves as a record of services personally performed by Karie Chimera, MD, PhD. It was created on their behalf by Glee Arvin. Manson Passey, OA an ophthalmic technician. The creation of this record is the provider's dictation and/or activities during the visit.    Electronically signed by: Glee Arvin. Manson Passey, OA 10/29/23 12:00 AM  Karie Chimera, M.D.,  Ph.D. Diseases & Surgery of the Retina and Vitreous [facility] 10/27/2023   I have reviewed the above documentation for accuracy and completeness, and I agree with the above. Karie Chimera, M.D., Ph.D. 10/29/23 12:03 AM   Abbreviations: M myopia (nearsighted); A astigmatism; H hyperopia (farsighted); P presbyopia; Mrx spectacle prescription;  CTL contact lenses; OD right eye; OS left eye; OU both eyes  XT exotropia; ET esotropia; PEK punctate epithelial keratitis; PEE punctate epithelial erosions; DES dry eye syndrome; MGD meibomian gland dysfunction; ATs artificial tears; PFAT's preservative free artificial tears; NSC nuclear sclerotic cataract; PSC posterior subcapsular cataract; ERM epi-retinal membrane; PVD posterior vitreous detachment; RD retinal detachment; DM diabetes mellitus; DR diabetic retinopathy; NPDR non-proliferative diabetic retinopathy; PDR proliferative diabetic retinopathy; CSME clinically significant macular edema; DME diabetic macular edema; dbh dot blot hemorrhages; CWS cotton wool spot; POAG primary open angle glaucoma; C/D cup-to-disc ratio; HVF humphrey visual field; GVF goldmann visual field; OCT optical coherence tomography; IOP intraocular pressure; BRVO Branch retinal vein occlusion; CRVO central retinal vein occlusion; CRAO central retinal artery occlusion; BRAO branch retinal artery occlusion; RT retinal tear; SB scleral buckle; PPV pars plana vitrectomy; VH Vitreous hemorrhage; PRP panretinal laser photocoagulation; IVK intravitreal kenalog; VMT vitreomacular traction; MH Macular hole;  NVD neovascularization of the disc; NVE neovascularization elsewhere; AREDS age related eye disease study; ARMD age related macular degeneration; POAG primary open angle glaucoma; EBMD epithelial/anterior basement membrane dystrophy; ACIOL anterior chamber intraocular lens; IOL intraocular lens; PCIOL posterior chamber intraocular lens; Phaco/IOL phacoemulsification  with intraocular lens placement; PRK photorefractive keratectomy; LASIK laser assisted in situ keratomileusis; HTN hypertension; DM diabetes mellitus; COPD chronic obstructive pulmonary disease

## 2023-10-27 ENCOUNTER — Encounter (INDEPENDENT_AMBULATORY_CARE_PROVIDER_SITE_OTHER): Payer: Self-pay | Admitting: Ophthalmology

## 2023-10-27 ENCOUNTER — Ambulatory Visit (INDEPENDENT_AMBULATORY_CARE_PROVIDER_SITE_OTHER): Payer: Medicare HMO | Admitting: Ophthalmology

## 2023-10-27 DIAGNOSIS — H43811 Vitreous degeneration, right eye: Secondary | ICD-10-CM | POA: Diagnosis not present

## 2023-10-27 DIAGNOSIS — H35033 Hypertensive retinopathy, bilateral: Secondary | ICD-10-CM | POA: Diagnosis not present

## 2023-10-27 DIAGNOSIS — I10 Essential (primary) hypertension: Secondary | ICD-10-CM | POA: Diagnosis not present

## 2023-10-27 DIAGNOSIS — Z961 Presence of intraocular lens: Secondary | ICD-10-CM

## 2023-10-27 DIAGNOSIS — H35711 Central serous chorioretinopathy, right eye: Secondary | ICD-10-CM | POA: Diagnosis not present

## 2023-10-27 DIAGNOSIS — H35412 Lattice degeneration of retina, left eye: Secondary | ICD-10-CM | POA: Diagnosis not present

## 2023-10-27 DIAGNOSIS — H353211 Exudative age-related macular degeneration, right eye, with active choroidal neovascularization: Secondary | ICD-10-CM

## 2023-10-27 MED ORDER — FARICIMAB-SVOA 6 MG/0.05ML IZ SOLN
6.0000 mg | INTRAVITREAL | Status: AC | PRN
Start: 2023-10-27 — End: 2023-10-27
  Administered 2023-10-27: 6 mg via INTRAVITREAL

## 2023-11-28 ENCOUNTER — Encounter (INDEPENDENT_AMBULATORY_CARE_PROVIDER_SITE_OTHER): Payer: Medicare HMO | Admitting: Ophthalmology

## 2023-11-28 DIAGNOSIS — H35711 Central serous chorioretinopathy, right eye: Secondary | ICD-10-CM

## 2023-11-28 DIAGNOSIS — Z961 Presence of intraocular lens: Secondary | ICD-10-CM

## 2023-11-28 DIAGNOSIS — H353211 Exudative age-related macular degeneration, right eye, with active choroidal neovascularization: Secondary | ICD-10-CM

## 2023-11-28 DIAGNOSIS — I10 Essential (primary) hypertension: Secondary | ICD-10-CM

## 2023-11-28 DIAGNOSIS — H35412 Lattice degeneration of retina, left eye: Secondary | ICD-10-CM

## 2023-11-28 DIAGNOSIS — H35033 Hypertensive retinopathy, bilateral: Secondary | ICD-10-CM

## 2023-11-28 DIAGNOSIS — H43811 Vitreous degeneration, right eye: Secondary | ICD-10-CM

## 2023-11-28 NOTE — Progress Notes (Signed)
 [facility] - Clinic Note  11/29/2023     CHIEF COMPLAINT Patient presents for Retina Follow Up  HISTORY OF PRESENT ILLNESS: Darlene Crawford is a 83 y.o. 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  This started 4 weeks ago.  I, the attending physician,  performed the HPI with the patient and updated documentation appropriately.        Comments   Patient feels that the vision is improving. She is using AT's PRN.      Last edited by Rennis Chris, MD on 11/29/2023  2:02 PM.     Pt states   Referring physician: Wendall Papa [address] [place],  [place] [address]  HISTORICAL INFORMATION:  Selected notes from the MEDICAL RECORD NUMBER Referred by Dr. Monico Blitz for concern of ARMD OD with progression to CNVM vs CSR   CURRENT MEDICATIONS: Current Outpatient Medications (Ophthalmic Drugs)  Medication Sig   ARTIFICIAL TEAR SOLUTION OP Place 1 drop into both eyes daily as needed (dry eyes).   Current Facility-Administered Medications (Ophthalmic Drugs)  Medication Route   aflibercept (EYLEA) SOLN 2 mg Intravitreal   aflibercept (EYLEA) SOLN 2 mg Intravitreal   aflibercept (EYLEA) SOLN 2 mg Intravitreal   Current Outpatient Medications (Other)  Medication Sig   acidophilus (RISAQUAD) CAPS capsule Take 1 capsule by mouth daily. (Patient not taking: Reported on 10/27/2023)   atorvastatin (LIPITOR) 10 MG tablet Take 10 mg by mouth at bedtime.    Cholecalciferol (VITAMIN D) 50 MCG (2000 UT) tablet Take 4,000 Units by mouth at bedtime.    Cyanocobalamin (B-12 PO) Take 1 Dose by mouth 2 (two) times a week.   fluticasone (FLONASE) 50 MCG/ACT nasal spray Place 1 spray into both nostrils daily as needed for allergies or rhinitis.   hydrochlorothiazide (HYDRODIURIL) 25 MG tablet Take 25 mg by mouth daily. 1/2 tablet am, 1/2 tablet pm (Patient not taking: Reported on 10/27/2023)    levothyroxine (SYNTHROID, LEVOTHROID) 100 MCG tablet Take 100 mcg by mouth daily before breakfast.   loratadine (CLARITIN) 10 MG tablet Take 10 mg by mouth daily as needed for allergies.   losartan (COZAAR) 100 MG tablet Take 100 mg by mouth daily.    Melatonin 10 MG CAPS Take 10 mg by mouth at bedtime.   Multiple Vitamins-Minerals (PRESERVISION AREDS 2 PO) Take 1 tablet by mouth at bedtime.    sertraline (ZOLOFT) 50 MG tablet Take 50 mg by mouth daily. (Patient not taking: Reported on 08/31/2023)   sodium chloride (OCEAN) 0.65 % SOLN nasal spray Place 1 spray into both nostrils as needed for congestion.   Current Facility-Administered Medications (Other)  Medication Route   Bevacizumab (AVASTIN) SOLN 1.25 mg Intravitreal   Bevacizumab (AVASTIN) SOLN 1.25 mg Intravitreal   Bevacizumab (AVASTIN) SOLN 1.25 mg Intravitreal   REVIEW OF SYSTEMS: ROS   Positive for: Genitourinary, Musculoskeletal, Endocrine, Eyes, Psychiatric, Allergic/Imm Negative for: Constitutional, Gastrointestinal, Neurological, Skin, HENT, Cardiovascular, Respiratory, Heme/Lymph Last edited by Charlette Caffey, COT on 11/29/2023  1:00 PM.        ALLERGIES Allergies  Allergen Reactions   Trilyte [Peg 3350-Kcl-Na Bicarb-Nacl]     HEMATEMESIS   Vicodin [Hydrocodone-Acetaminophen] Nausea And Vomiting   PAST MEDICAL HISTORY Past Medical History:  Diagnosis Date   Anxiety and depression    Cataract    Hypercholesterolemia  Hypertension    Hypertensive retinopathy    OU   Hypothyroidism    Macular degeneration    OD   Osteoarthritis    PONV (postoperative nausea and vomiting)    Seasonal allergies    Past Surgical History:  Procedure Laterality Date   BIOPSY  10/01/2018   Procedure: BIOPSY;  Surgeon: West Bali, MD;  Location: [facility];  Service: Endoscopy;;  gastric bx & gastric polyp   CATARACT EXTRACTION W/PHACO Right 12/23/2022   Procedure: CATARACT EXTRACTION PHACO AND INTRAOCULAR LENS  PLACEMENT (IOC);  Surgeon: Pecolia Ades, MD;  Location: [facility];  Service: Ophthalmology;  Laterality: Right;  CDE: 11.88   CATARACT EXTRACTION W/PHACO Left 02/24/2023   Procedure: CATARACT EXTRACTION PHACO AND INTRAOCULAR LENS PLACEMENT (IOC);  Surgeon: Pecolia Ades, MD;  Location: [facility];  Service: Ophthalmology;  Laterality: Left;  CDE  5.82   COLONOSCOPY N/A 10/01/2018   Procedure: COLONOSCOPY;  Surgeon: West Bali, MD;  Location: [facility];  Service: Endoscopy;  Laterality: N/A;  10:30am   ESOPHAGOGASTRODUODENOSCOPY N/A 10/01/2018   Procedure: ESOPHAGOGASTRODUODENOSCOPY (EGD);  Surgeon: West Bali, MD;  Location: [facility];  Service: Endoscopy;  Laterality: N/A;   POLYPECTOMY  10/01/2018   Procedure: POLYPECTOMY;  Surgeon: West Bali, MD;  Location: [facility];  Service: Endoscopy;;  colon    TUBAL LIGATION     UTERINE FIBROID SURGERY     FAMILY HISTORY Family History  Problem Relation Age of Onset   Cancer Mother        lung   Stroke Father    Stroke Brother    Colon cancer Neg Hx    SOCIAL HISTORY Social History   Tobacco Use   Smoking status: Former    Current packs/day: 0.00    Average packs/day: 0.3 packs/day for 4.0 years (1.0 ttl pk-yrs)    Types: Cigarettes    Start date: 11/04/1960    Quit date: 11/04/1964    Years since quitting: 59.1   Smokeless tobacco: Never  Vaping Use   Vaping status: Never Used  Substance Use Topics   Alcohol use: No   Drug use: No       OPHTHALMIC EXAM:  Base Eye Exam     Visual Acuity (Snellen - Linear)       Right Left   Dist [place] 20/30 +1 20/30 +2   Dist ph [place] 20/25         Tonometry (Tonopen, 1:04 PM)       Right Left   Pressure 16 17         Pupils       Dark Shape   Right 3 Round   Left 3 Round         Visual Fields       Left Right    Full Full         Extraocular Movement       Right Left    Full, Ortho Full, Ortho         Neuro/Psych     Oriented x3: Yes    Mood/Affect: Normal         Dilation     Right eye: 1.0% Mydriacyl, 2.5% Phenylephrine @ 1:01 PM           Slit Lamp and Fundus Exam     Slit Lamp Exam       Right Left   Lids/Lashes Dermatochalasis - upper lid Dermatochalasis - upper lid  Conjunctiva/Sclera White and quiet White and quiet   Cornea Arcus, well healed cataract wound Arcus, early nasal band K, 1+PEE   Anterior Chamber Deep, 1-2+ fine cell / pigment moderate depth, with narrow angle temporally   Iris Round and well dilated Round and well dilated   Lens PC IOL in good position PC IOL in good postition   Anterior Vitreous Vitreous syneresis, Posterior vitreous detachment, Weiss ring Vitreous syneresis         Fundus Exam       Right Left   Disc Pink and Sharp, Compact Pink and Sharp, Tilted disc   C/D Ratio 0.2 0.3   Macula Flat, blunted foveal reflex; central SRF -- improved, +drusen; +RPE mottling and clumping, small cluster of drusen and RPE mottling inferior fovea, no heme Flat, good foveal reflex, Retinal pigment epithelial mottling, superior para-foveal focal area of RPE atrophy -- stable, rare drusen, No heme or edema   Vessels attenuated, Tortuous Vascular attenuation, Tortuous   Periphery Attached, peripheral drusen / RPE changes nasally, peripheral cystoid degeneration Attached, very mild lattice at 0430, mild patch of Lattice degeneration at 0600 -- stable, scattered peripheral drusen, peripheral cystoid degeneration           IMAGING AND PROCEDURES  Imaging and Procedures for 12/11/17  OCT, Retina - OU - Both Eyes       Right Eye Quality was good. Central Foveal Thickness: 357. Progression has improved. Findings include no IRF, abnormal foveal contour, retinal drusen , pigment epithelial detachment, subretinal fluid, outer retinal atrophy (Mild interval improvement in central and inferior SRF ).   Left Eye Quality was good. Central Foveal Thickness: 271. Progression has been stable.  Findings include normal foveal contour, no IRF, no SRF, retinal drusen .   Notes *Images captured and stored on drive  Diagnosis / Impression:  OD: Mild interval improvement in central and inferior SRF  OS: NFP, No IRF/SRF  Clinical management:  See below  Abbreviations: NFP - Normal foveal profile. CME - cystoid macular edema. PED - pigment epithelial detachment. IRF - intraretinal fluid. SRF - subretinal fluid. EZ - ellipsoid zone. ERM - epiretinal membrane. ORA - outer retinal atrophy. ORT - outer retinal tubulation. SRHM - subretinal hyper-reflective material       Intravitreal Injection, Pharmacologic Agent - OD - Right Eye       Time Out 11/29/2023. 1:52 PM. Confirmed correct patient, procedure, site, and patient consented.   Anesthesia Topical anesthesia was used. Anesthetic medications included Lidocaine 2%, Proparacaine 0.5%.   Procedure Preparation included 5% betadine to ocular surface, eyelid speculum. A supplied (32g) needle was used.   Injection: 6 mg faricimab-svoa 6 MG/0.05ML   Route: Intravitreal, Site: Right Eye   NDC: [id number], Lot: [id number], Expiration date: 12/26/2024, Waste: 0 mL   Post-op Post injection exam found visual acuity of at least counting fingers. The patient tolerated the procedure well. There were no complications. The patient received written and verbal post procedure care education. Post injection medications were not given.            ASSESSMENT/PLAN:    ICD-10-CM   1. Exudative age-related macular degeneration of right eye with active choroidal neovascularization (HCC)  H35.3211 OCT, Retina - OU - Both Eyes    Intravitreal Injection, Pharmacologic Agent - OD - Right Eye    faricimab-svoa (VABYSMO) 6mg /0.71mL intravitreal injection    2. 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 Essential hypertension  I10  4. Hypertensive retinopathy of both eyes  H35.033     5. Lattice degeneration of left retina   H35.412     6. Posterior vitreous detachment of right eye  H43.811     7. Pseudophakia  Z96.1      1,2. Exudative age related macular degeneration vs CSCR OD  - FA (03.02.19) without leakage / pooling into area of SRF - review of systems reveals significant stressors in life, but no exogenous steroid use - S/P IVA #1 OD (09.27.19),  #2 (10.25.19), #3 (11.22.19) -- minimal response -- IVA resistance  - pt approved for [employer] and IVE  =============================== - S/P IVE OD #1 (12.20.19), #2 (01.20.20), #3 (02.18.20), #4 (07.20.20) #5 (09.01.20), #6 (10.13.20), #7 (11.20.20), #8 (01.20.21), #9 (02.17.21), #10 (03.19.21), #11 (04.20.21), #12 (05.25.21), #13 (6.23.21), #14 (07.27.21), #15 (08.30.21), #16 (9.28.21), #17 (10.26.21), #18 (11.29.21), #19 (01.11.22), #20 (02.08.22), #21 (3.8.22), #22 (04.05.22), #23 (5.10.22), #24 (06.21.22), #25 (8.2.22), #26 (9.20.22), #27 (11.15.22), #28 (01.17.23), #29 (03.29.23), #30 (06.21.23), #31 (09.27.23), #32 (01.16.24), #33 (05.07.24), #34 (06.18.24), #35 (07.30.24) -- IVE resistance - s/p IVV OD #1 (09.03.24), #2 (10.03.24), #3 (11.06.24), #4 (12.04.24) #5(01.02.25) #6 (01.31.25 -- sample), #7 (sample -- 02.28.25)  - lost to f/u from Feb 2020 to July 2020 due to COVID-19 concerns - today, BCVA OD 20/30 from 20/40 - OCT shows Mild interval improvement in central and inferior SRF   - recommend IVV OD (SAMPLE) #8 today, 04.02.25 -- with follow up in 4 wks  - pt wishes to proceed with IVV OD sample  - RBA of procedure discussed, questions answered  - informed consent obtained   - Vabysmo informed consent form signed and scanned on 09.03.24  - see procedure note  - Eylea4U benefits investigation initiated -- approved for 2025  - Vabysmo approved for 2025   - no [employer] funding available  - EyleaHD approved for 2025  - f/u 4 weeks -- DFE/OCT/ possible injection  3,4. Hypertensive retinopathy OU  - discussed importance of tight BP control  -  stable  - monitor  5. Lattice degeneration OS  - patch of inferior lattice OS -- no RT, holes or SRF  - asymptomatic   - discussed findings, prognosis, and treatment options including observation  - monitor  6. PVD / vitreous syneresis OD-   - Discussed findings and prognosis  - No RT or RD on 360 peripheral exam  - Reviewed s/s of RT/RD  - strict return precautions for any such RT/RD symptoms  7. Pseudophakia OU  - s/p CE/IOL OD (Dr. Ilsa Iha, 04.26.24, OS 02/24/23)  - IOL in good position, doing well  - monitor  Ophthalmic Meds Ordered this visit:  Meds ordered this encounter  Medications   faricimab-svoa (VABYSMO) 6mg /0.42mL intravitreal injection     Return in about 4 weeks (around 12/27/2023) for f/u exu ARMD OD, DFE, OCT, Possible Injxn.  There are no Patient Instructions on file for this visit.   This document serves as a record of services personally performed by Karie Chimera, MD, PhD. It was created on their behalf by Glee Arvin. Manson Passey, OA an ophthalmic technician. The creation of this record is the provider's dictation and/or activities during the visit.    Electronically signed by: Glee Arvin. Manson Passey, OA 12/01/23 11:45 PM  Karie Chimera, M.D., Ph.D. Diseases & Surgery of the Retina and Vitreous [facility]  I have reviewed the above documentation for accuracy and completeness, and I agree  with the above. Karie Chimera, M.D., Ph.D. 12/01/23 11:48 PM   Abbreviations: M myopia (nearsighted); A astigmatism; H hyperopia (farsighted); P presbyopia; Mrx spectacle prescription;  CTL contact lenses; OD right eye; OS left eye; OU both eyes  XT exotropia; ET esotropia; PEK punctate epithelial keratitis; PEE punctate epithelial erosions; DES dry eye syndrome; MGD meibomian gland dysfunction; ATs artificial tears; PFAT's preservative free artificial tears; NSC nuclear sclerotic cataract; PSC posterior subcapsular cataract; ERM epi-retinal membrane; PVD  posterior vitreous detachment; RD retinal detachment; DM diabetes mellitus; DR diabetic retinopathy; NPDR non-proliferative diabetic retinopathy; PDR proliferative diabetic retinopathy; CSME clinically significant macular edema; DME diabetic macular edema; dbh dot blot hemorrhages; CWS cotton wool spot; POAG primary open angle glaucoma; C/D cup-to-disc ratio; HVF humphrey visual field; GVF goldmann visual field; OCT optical coherence tomography; IOP intraocular pressure; BRVO Branch retinal vein occlusion; CRVO central retinal vein occlusion; CRAO central retinal artery occlusion; BRAO branch retinal artery occlusion; RT retinal tear; SB scleral buckle; PPV pars plana vitrectomy; VH Vitreous hemorrhage; PRP panretinal laser photocoagulation; IVK intravitreal kenalog; VMT vitreomacular traction; MH Macular hole;  NVD neovascularization of the disc; NVE neovascularization elsewhere; AREDS age related eye disease study; ARMD age related macular degeneration; POAG primary open angle glaucoma; EBMD epithelial/anterior basement membrane dystrophy; ACIOL anterior chamber intraocular lens; IOL intraocular lens; PCIOL posterior chamber intraocular lens; Phaco/IOL phacoemulsification with intraocular lens placement; PRK photorefractive keratectomy; LASIK laser assisted in situ keratomileusis; HTN hypertension; DM diabetes mellitus; COPD chronic obstructive pulmonary disease

## 2023-11-29 ENCOUNTER — Encounter (INDEPENDENT_AMBULATORY_CARE_PROVIDER_SITE_OTHER): Payer: Self-pay | Admitting: Ophthalmology

## 2023-11-29 ENCOUNTER — Ambulatory Visit (INDEPENDENT_AMBULATORY_CARE_PROVIDER_SITE_OTHER): Admitting: Ophthalmology

## 2023-11-29 DIAGNOSIS — I10 Essential (primary) hypertension: Secondary | ICD-10-CM | POA: Diagnosis not present

## 2023-11-29 DIAGNOSIS — H35412 Lattice degeneration of retina, left eye: Secondary | ICD-10-CM | POA: Diagnosis not present

## 2023-11-29 DIAGNOSIS — H35033 Hypertensive retinopathy, bilateral: Secondary | ICD-10-CM | POA: Diagnosis not present

## 2023-11-29 DIAGNOSIS — H43811 Vitreous degeneration, right eye: Secondary | ICD-10-CM | POA: Diagnosis not present

## 2023-11-29 DIAGNOSIS — H353211 Exudative age-related macular degeneration, right eye, with active choroidal neovascularization: Secondary | ICD-10-CM

## 2023-11-29 DIAGNOSIS — Z961 Presence of intraocular lens: Secondary | ICD-10-CM

## 2023-11-29 DIAGNOSIS — H35711 Central serous chorioretinopathy, right eye: Secondary | ICD-10-CM

## 2023-11-29 MED ORDER — FARICIMAB-SVOA 6 MG/0.05ML IZ SOSY
6.0000 mg | PREFILLED_SYRINGE | INTRAVITREAL | Status: AC | PRN
Start: 2023-11-29 — End: 2023-11-29
  Administered 2023-11-29: 6 mg via INTRAVITREAL

## 2023-12-27 NOTE — Progress Notes (Signed)
 [facility] - Clinic Note  12/29/2023     CHIEF COMPLAINT Patient presents for Retina Follow Up  HISTORY OF PRESENT ILLNESS: Darlene Crawford is a 83 y.o. 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  This started 4 weeks ago.  I, the attending physician,  performed the HPI with the patient and updated documentation appropriately.        Comments   Patient states the vision is about the same. She is not using eye drops.       Last edited by Ronelle Coffee, MD on 12/29/2023  1:16 PM.    Pt states   Referring physician: Hairfield, Keavie C [address] [place],  [place] [address]  HISTORICAL INFORMATION:  Selected notes from the MEDICAL RECORD NUMBER Referred by Dr. Haywood Lisle for concern of ARMD OD with progression to CNVM vs CSR   CURRENT MEDICATIONS: Current Outpatient Medications (Ophthalmic Drugs)  Medication Sig   ARTIFICIAL TEAR SOLUTION OP Place 1 drop into both eyes daily as needed (dry eyes).   Current Facility-Administered Medications (Ophthalmic Drugs)  Medication Route   aflibercept  (EYLEA ) SOLN 2 mg Intravitreal   aflibercept  (EYLEA ) SOLN 2 mg Intravitreal   aflibercept  (EYLEA ) SOLN 2 mg Intravitreal   Current Outpatient Medications (Other)  Medication Sig   acidophilus (RISAQUAD) CAPS capsule Take 1 capsule by mouth daily. (Patient not taking: Reported on 10/27/2023)   atorvastatin (LIPITOR) 10 MG tablet Take 10 mg by mouth at bedtime.    Cholecalciferol (VITAMIN D) 50 MCG (2000 UT) tablet Take 4,000 Units by mouth at bedtime.    Cyanocobalamin (B-12 PO) Take 1 Dose by mouth 2 (two) times a week.   fluticasone (FLONASE) 50 MCG/ACT nasal spray Place 1 spray into both nostrils daily as needed for allergies or rhinitis.   hydrochlorothiazide (HYDRODIURIL) 25 MG tablet Take 25 mg by mouth daily. 1/2 tablet am, 1/2 tablet pm (Patient not taking: Reported on 10/27/2023)    levothyroxine (SYNTHROID, LEVOTHROID) 100 MCG tablet Take 100 mcg by mouth daily before breakfast.   loratadine (CLARITIN) 10 MG tablet Take 10 mg by mouth daily as needed for allergies.   losartan (COZAAR) 100 MG tablet Take 100 mg by mouth daily.    Melatonin 10 MG CAPS Take 10 mg by mouth at bedtime.   Multiple Vitamins-Minerals (PRESERVISION AREDS 2 PO) Take 1 tablet by mouth at bedtime.    sertraline (ZOLOFT) 50 MG tablet Take 50 mg by mouth daily. (Patient not taking: Reported on 08/31/2023)   sodium chloride  (OCEAN) 0.65 % SOLN nasal spray Place 1 spray into both nostrils as needed for congestion.   Current Facility-Administered Medications (Other)  Medication Route   Bevacizumab  (AVASTIN ) SOLN 1.25 mg Intravitreal   Bevacizumab  (AVASTIN ) SOLN 1.25 mg Intravitreal   Bevacizumab  (AVASTIN ) SOLN 1.25 mg Intravitreal   REVIEW OF SYSTEMS: ROS   Positive for: Genitourinary, Musculoskeletal, Endocrine, Eyes, Psychiatric, Allergic/Imm Negative for: Constitutional, Gastrointestinal, Neurological, Skin, HENT, Cardiovascular, Respiratory, Heme/Lymph Last edited by Olene Berne, COT on 12/29/2023 12:56 PM.         ALLERGIES Allergies  Allergen Reactions   Trilyte [Peg 3350 -Kcl-Na Bicarb-Nacl]     HEMATEMESIS   Vicodin [Hydrocodone-Acetaminophen] Nausea And Vomiting   PAST MEDICAL HISTORY Past Medical History:  Diagnosis Date   Anxiety and depression    Cataract    Hypercholesterolemia  Hypertension    Hypertensive retinopathy    OU   Hypothyroidism    Macular degeneration    OD   Osteoarthritis    PONV (postoperative nausea and vomiting)    Seasonal allergies    Past Surgical History:  Procedure Laterality Date   BIOPSY  10/01/2018   Procedure: BIOPSY;  Surgeon: Alyce Jubilee, MD;  [facility]: [facility];  Service: Endoscopy;;  gastric bx & gastric polyp   CATARACT EXTRACTION W/PHACO Right 12/23/2022   Procedure: CATARACT EXTRACTION PHACO AND INTRAOCULAR LENS  PLACEMENT (IOC);  Surgeon: Ardeth Krabbe, MD;  Location: [facility];  Service: Ophthalmology;  Laterality: Right;  CDE: 11.88   CATARACT EXTRACTION W/PHACO Left 02/24/2023   Procedure: CATARACT EXTRACTION PHACO AND INTRAOCULAR LENS PLACEMENT (IOC);  Surgeon: Ardeth Krabbe, MD;  Location: [facility];  Service: Ophthalmology;  Laterality: Left;  CDE  5.82   COLONOSCOPY N/A 10/01/2018   Procedure: COLONOSCOPY;  Surgeon: Alyce Jubilee, MD;  Location: [facility];  Service: Endoscopy;  Laterality: N/A;  10:30am   ESOPHAGOGASTRODUODENOSCOPY N/A 10/01/2018   Procedure: ESOPHAGOGASTRODUODENOSCOPY (EGD);  Surgeon: Alyce Jubilee, MD;  Location: [facility];  Service: Endoscopy;  Laterality: N/A;   POLYPECTOMY  10/01/2018   Procedure: POLYPECTOMY;  Surgeon: Alyce Jubilee, MD;  Location: [facility];  Service: Endoscopy;;  colon    TUBAL LIGATION     UTERINE FIBROID SURGERY     FAMILY HISTORY Family History  Problem Relation Age of Onset   Cancer Mother        lung   Stroke Father    Stroke Brother    Colon cancer Neg Hx    SOCIAL HISTORY Social History   Tobacco Use   Smoking status: Former    Current packs/day: 0.00    Average packs/day: 0.3 packs/day for 4.0 years (1.0 ttl pk-yrs)    Types: Cigarettes    Start date: 11/04/1960    Quit date: 11/04/1964    Years since quitting: 59.1   Smokeless tobacco: Never  Vaping Use   Vaping status: Never Used  Substance Use Topics   Alcohol  use: No   Drug use: No       OPHTHALMIC EXAM:  Base Eye Exam     Visual Acuity (Snellen - Linear)       Right Left   Dist [place] 20/30 20/30   Dist ph [place] NI 20/25         Tonometry (Tonopen, 12:59 PM)       Right Left   Pressure 13 12         Pupils       Dark Light Shape React APD   Right 3 2 Round Brisk None   Left 3 2 Round Brisk None         Visual Fields       Left Right    Full Full         Extraocular Movement       Right Left    Full, Ortho Full, Ortho          Neuro/Psych     Oriented x3: Yes   Mood/Affect: Normal         Dilation     Right eye: 1.0% Mydriacyl , 2.5% Phenylephrine  @ 12:56 PM           Slit Lamp and Fundus Exam     Slit Lamp Exam       Right Left   Lids/Lashes Dermatochalasis -  upper lid Dermatochalasis - upper lid   Conjunctiva/Sclera White and quiet White and quiet   Cornea Arcus, well healed cataract wound Arcus, early nasal band K, 1+PEE   Anterior Chamber Deep, 1-2+ fine cell / pigment moderate depth, with narrow angle temporally   Iris Round and well dilated Round and well dilated   Lens PC IOL in good position PC IOL in good postition   Anterior Vitreous Vitreous syneresis, Posterior vitreous detachment, Weiss ring Vitreous syneresis         Fundus Exam       Right Left   Disc Pink and Sharp, Compact Pink and Sharp, Tilted disc   C/D Ratio 0.2 0.3   Macula Flat, blunted foveal reflex; central SRF -- slightly increased, +drusen; +RPE mottling and clumping, small cluster of drusen and RPE mottling inferior fovea, no heme Flat, good foveal reflex, Retinal pigment epithelial mottling, superior para-foveal focal area of RPE atrophy -- stable, rare drusen, No heme or edema   Vessels attenuated, Tortuous Vascular attenuation, Tortuous   Periphery Attached, peripheral drusen / RPE changes nasally, peripheral cystoid degeneration Attached, very mild lattice at 0430, mild patch of Lattice degeneration at 0600 -- stable, scattered peripheral drusen, peripheral cystoid degeneration           IMAGING AND PROCEDURES  Imaging and Procedures for 12/11/17  OCT, Retina - OU - Both Eyes       Right Eye Quality was good. Central Foveal Thickness: 405. Progression has worsened. Findings include no IRF, abnormal foveal contour, retinal drusen , pigment epithelial detachment, subretinal fluid, outer retinal atrophy (Mild interval increase in central and inferior SRF and SRHM).   Left Eye Quality was good. Central  Foveal Thickness: 276. Progression has been stable. Findings include normal foveal contour, no IRF, no SRF, retinal drusen .   Notes *Images captured and stored on drive  Diagnosis / Impression:  OD: Mild interval increase in central and inferior SRF  OS: NFP, No IRF/SRF  Clinical management:  See below  Abbreviations: NFP - Normal foveal profile. CME - cystoid macular edema. PED - pigment epithelial detachment. IRF - intraretinal fluid. SRF - subretinal fluid. EZ - ellipsoid zone. ERM - epiretinal membrane. ORA - outer retinal atrophy. ORT - outer retinal tubulation. SRHM - subretinal hyper-reflective material       Intravitreal Injection, Pharmacologic Agent - OD - Right Eye       Time Out 12/29/2023. 1:23 PM. Confirmed correct patient, procedure, site, and patient consented.   Anesthesia Topical anesthesia was used. Anesthetic medications included Lidocaine  2%, Proparacaine 0.5%.   Procedure Preparation included 5% betadine  to ocular surface, eyelid speculum. A supplied (30g) needle was used.   Injection: 6 mg faricimab -svoa 6 MG/0.05ML   Route: Intravitreal, Site: Right Eye   NDC: 16109-604-54, Lot: [id number], Expiration date: 12/26/2024, Waste: 0 mL   Post-op Post injection exam found visual acuity of at least counting fingers. The patient tolerated the procedure well. There were no complications. The patient received written and verbal post procedure care education. Post injection medications were not given.            ASSESSMENT/PLAN:    ICD-10-CM   1. Exudative age-related macular degeneration of right eye with active choroidal neovascularization (HCC)  H35.3211 OCT, Retina - OU - Both Eyes    Intravitreal Injection, Pharmacologic Agent - OD - Right Eye    faricimab -svoa (VABYSMO ) 6mg /0.59mL intravitreal injection    2. Central serous chorioretinopathy of right eye  H35.711  3. Essential hypertension  I10     4. Hypertensive retinopathy of both eyes   H35.033     5. 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 Posterior vitreous detachment of right eye  H43.811     7. Pseudophakia  Z96.1      1,2. Exudative age related macular degeneration vs CSCR OD  - FA (03.02.19) without leakage / pooling into area of SRF - review of systems reveals significant stressors in life, but no exogenous steroid use - S/P IVA #1 OD (09.27.19),  #2 (10.25.19), #3 (11.22.19) -- minimal response -- IVA resistance  - pt approved for [employer] and IVE  =============================== - S/P IVE OD #1 (12.20.19), #2 (01.20.20), #3 (02.18.20), #4 (07.20.20) #5 (09.01.20), #6 (10.13.20), #7 (11.20.20), #8 (01.20.21), #9 (02.17.21), #10 (03.19.21), #11 (04.20.21), #12 (05.25.21), #13 (6.23.21), #14 (07.27.21), #15 (08.30.21), #16 (9.28.21), #17 (10.26.21), #18 (11.29.21), #19 (01.11.22), #20 (02.08.22), #21 (3.8.22), #22 (04.05.22), #23 (5.10.22), #24 (06.21.22), #25 (8.2.22), #26 (9.20.22), #27 (11.15.22), #28 (01.17.23), #29 (03.29.23), #30 (06.21.23), #31 (09.27.23), #32 (01.16.24), #33 (05.07.24), #34 (06.18.24), #35 (07.30.24) -- IVE resistance - s/p IVV OD #1 (09.03.24), #2 (10.03.24), #3 (11.06.24), #4 (12.04.24) #5(01.02.25) #6 (01.31.25 -- sample), #7 (sample -- 02.28.25), #8 (04.02.25)   - lost to f/u from Feb 2020 to July 2020 due to COVID-19 concerns - today, BCVA OD stable at 20/30 - OCT shows Mild interval increase in central and inferior SRF at 4 wks  - recommend IVV OD #9 today, 05.02.25 -- with follow up in 4 wks  - pt wishes to proceed with IVV OD sample  - RBA of procedure discussed, questions answered  - informed consent obtained   - Vabysmo  informed consent form signed and scanned on 09.03.24  - see procedure note  - Eylea4U benefits investigation initiated -- approved for 2025  - Vabysmo  approved for 2025   - [employer] funding fully reinstated as of 04.02.25  - EyleaHD approved for 2025  - f/u 4 weeks -- DFE/OCT, possible  injection  3,4. Hypertensive retinopathy OU  - discussed importance of tight BP control  - stable  - monitor  5. Lattice degeneration OS  - patch of inferior lattice OS -- no RT, holes or SRF  - asymptomatic   - discussed findings, prognosis, and treatment options including observation  - monitor  6. PVD / vitreous syneresis OD-   - Discussed findings and prognosis  - No RT or RD on 360 peripheral exam  - Reviewed s/s of RT/RD  - strict return precautions for any such RT/RD symptoms  7. Pseudophakia OU  - s/p CE/IOL OD (Dr. Artemio Larry, 04.26.24, OS 02/24/23)  - IOL in good position, doing well  - monitor  Ophthalmic Meds Ordered this visit:  Meds ordered this encounter  Medications   faricimab -svoa (VABYSMO ) 6mg /0.69mL intravitreal injection     Return in about 4 weeks (around 01/26/2024) for f/u exu ARMD OD, DFE, OCT, Possible Injxn.  There are no Patient Instructions on file for this visit.  This document serves as a record of services personally performed by Jeanice Millard, MD, PhD. It was created on their behalf by Eller Gut COT, an ophthalmic technician. The creation of this record is the provider's dictation and/or activities during the visit.    Electronically signed by: Eller Gut COT 04.30.25 9:12 PM  This document serves as a record of services personally performed by Jeanice Millard, MD, PhD. It was created on  their behalf by Morley Arabia. Bevin Bucks, OA an ophthalmic technician. The creation of this record is the provider's dictation and/or activities during the visit.    Electronically signed by: Morley Arabia. Bevin Bucks, OA 12/30/23 9:12 PM  Jeanice Millard, M.D., Ph.D. Diseases & Surgery of the Retina and Vitreous [facility]  I have reviewed the above documentation for accuracy and completeness, and I agree with the above. Jeanice Millard, M.D., Ph.D. 12/30/23 9:16 PM   Abbreviations: M myopia (nearsighted); A astigmatism; H hyperopia  (farsighted); P presbyopia; Mrx spectacle prescription;  CTL contact lenses; OD right eye; OS left eye; OU both eyes  XT exotropia; ET esotropia; PEK punctate epithelial keratitis; PEE punctate epithelial erosions; DES dry eye syndrome; MGD meibomian gland dysfunction; ATs artificial tears; PFAT's preservative free artificial tears; NSC nuclear sclerotic cataract; PSC posterior subcapsular cataract; ERM epi-retinal membrane; PVD posterior vitreous detachment; RD retinal detachment; DM diabetes mellitus; DR diabetic retinopathy; NPDR non-proliferative diabetic retinopathy; PDR proliferative diabetic retinopathy; CSME clinically significant macular edema; DME diabetic macular edema; dbh dot blot hemorrhages; CWS cotton wool spot; POAG primary open angle glaucoma; C/D cup-to-disc ratio; HVF humphrey visual field; GVF goldmann visual field; OCT optical coherence tomography; IOP intraocular pressure; BRVO Branch retinal vein occlusion; CRVO central retinal vein occlusion; CRAO central retinal artery occlusion; BRAO branch retinal artery occlusion; RT retinal tear; SB scleral buckle; PPV pars plana vitrectomy; VH Vitreous hemorrhage; PRP panretinal laser photocoagulation; IVK intravitreal kenalog; VMT vitreomacular traction; MH Macular hole;  NVD neovascularization of the disc; NVE neovascularization elsewhere; AREDS age related eye disease study; ARMD age related macular degeneration; POAG primary open angle glaucoma; EBMD epithelial/anterior basement membrane dystrophy; ACIOL anterior chamber intraocular lens; IOL intraocular lens; PCIOL posterior chamber intraocular lens; Phaco/IOL phacoemulsification with intraocular lens placement; PRK photorefractive keratectomy; LASIK laser assisted in situ keratomileusis; HTN hypertension; DM diabetes mellitus; COPD chronic obstructive pulmonary disease

## 2023-12-29 ENCOUNTER — Encounter (INDEPENDENT_AMBULATORY_CARE_PROVIDER_SITE_OTHER): Payer: Self-pay | Admitting: Ophthalmology

## 2023-12-29 ENCOUNTER — Ambulatory Visit (INDEPENDENT_AMBULATORY_CARE_PROVIDER_SITE_OTHER): Admitting: Ophthalmology

## 2023-12-29 DIAGNOSIS — H35711 Central serous chorioretinopathy, right eye: Secondary | ICD-10-CM

## 2023-12-29 DIAGNOSIS — H35412 Lattice degeneration of retina, left eye: Secondary | ICD-10-CM | POA: Diagnosis not present

## 2023-12-29 DIAGNOSIS — H43811 Vitreous degeneration, right eye: Secondary | ICD-10-CM

## 2023-12-29 DIAGNOSIS — H353211 Exudative age-related macular degeneration, right eye, with active choroidal neovascularization: Secondary | ICD-10-CM | POA: Diagnosis not present

## 2023-12-29 DIAGNOSIS — H35033 Hypertensive retinopathy, bilateral: Secondary | ICD-10-CM | POA: Diagnosis not present

## 2023-12-29 DIAGNOSIS — Z961 Presence of intraocular lens: Secondary | ICD-10-CM | POA: Diagnosis not present

## 2023-12-29 DIAGNOSIS — I10 Essential (primary) hypertension: Secondary | ICD-10-CM | POA: Diagnosis not present

## 2023-12-29 MED ORDER — FARICIMAB-SVOA 6 MG/0.05ML IZ SOSY
6.0000 mg | PREFILLED_SYRINGE | INTRAVITREAL | Status: AC | PRN
Start: 2023-12-29 — End: 2023-12-29
  Administered 2023-12-29: 6 mg via INTRAVITREAL

## 2024-01-24 NOTE — Progress Notes (Signed)
 [facility] - Clinic Note  01/31/2024     CHIEF COMPLAINT Patient presents for Retina Follow Up  HISTORY OF PRESENT ILLNESS: Darlene Crawford is a 83 y.o. 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  This started 5 weeks ago.  I, the attending physician,  performed the HPI with the patient and updated documentation appropriately.        Comments   Patient here for 5 weeks retina follow up for exu ARMD OD. Patient states vision can't tell a big difference. OD getting worse. No eye pain.       Last edited by Ronelle Coffee, MD on 01/31/2024  5:35 PM.     Pt states   Referring physician: Hairfield, Keavie C [address] [place],  [place] [address]  HISTORICAL INFORMATION:  Selected notes from the MEDICAL RECORD NUMBER Referred by Dr. Haywood Lisle for concern of ARMD OD with progression to CNVM vs CSR   CURRENT MEDICATIONS: Current Outpatient Medications (Ophthalmic Drugs)  Medication Sig   ARTIFICIAL TEAR SOLUTION OP Place 1 drop into both eyes daily as needed (dry eyes).   Current Facility-Administered Medications (Ophthalmic Drugs)  Medication Route   aflibercept  (EYLEA ) SOLN 2 mg Intravitreal   aflibercept  (EYLEA ) SOLN 2 mg Intravitreal   aflibercept  (EYLEA ) SOLN 2 mg Intravitreal   Current Outpatient Medications (Other)  Medication Sig   atorvastatin (LIPITOR) 10 MG tablet Take 10 mg by mouth at bedtime.    Cholecalciferol (VITAMIN D) 50 MCG (2000 UT) tablet Take 4,000 Units by mouth at bedtime.    Cyanocobalamin (B-12 PO) Take 1 Dose by mouth 2 (two) times a week.   estradiol (ESTRACE) 0.1 MG/GM vaginal cream Discard plastic applicator. Insert a blueberry size amount (approximately 1 gram) of cream on fingertip inside vagina at bedtime every night for 1 week then every other night. For long term use.   levothyroxine (SYNTHROID, LEVOTHROID) 100 MCG tablet Take 100 mcg by mouth  daily before breakfast.   losartan (COZAAR) 100 MG tablet Take 100 mg by mouth daily.    Melatonin 10 MG CAPS Take 10 mg by mouth at bedtime.   Multiple Vitamins-Minerals (PRESERVISION AREDS 2 PO) Take 1 tablet by mouth at bedtime.    acidophilus (RISAQUAD) CAPS capsule Take 1 capsule by mouth daily. (Patient not taking: Reported on 01/31/2024)   fluticasone (FLONASE) 50 MCG/ACT nasal spray Place 1 spray into both nostrils daily as needed for allergies or rhinitis. (Patient not taking: Reported on 01/31/2024)   hydrochlorothiazide (HYDRODIURIL) 25 MG tablet Take 25 mg by mouth daily. 1/2 tablet am, 1/2 tablet pm (Patient not taking: Reported on 01/31/2024)   loratadine (CLARITIN) 10 MG tablet Take 10 mg by mouth daily as needed for allergies. (Patient not taking: Reported on 01/31/2024)   sertraline (ZOLOFT) 50 MG tablet Take 50 mg by mouth daily. (Patient not taking: Reported on 01/31/2024)   sodium chloride  (OCEAN) 0.65 % SOLN nasal spray Place 1 spray into both nostrils as needed for congestion. (Patient not taking: Reported on 01/29/2024)   Current Facility-Administered Medications (Other)  Medication Route   Bevacizumab  (AVASTIN ) SOLN 1.25 mg Intravitreal   Bevacizumab  (AVASTIN ) SOLN 1.25 mg Intravitreal   Bevacizumab  (AVASTIN ) SOLN 1.25 mg Intravitreal   REVIEW OF SYSTEMS: ROS   Positive for: Genitourinary, Musculoskeletal, Endocrine, Eyes, Psychiatric, Allergic/Imm Negative for: Constitutional, Gastrointestinal, Neurological, Skin,  HENT, Cardiovascular, Respiratory, Heme/Lymph Last edited by Sylvan Evener, COA on 01/31/2024 12:54 PM.          ALLERGIES Allergies  Allergen Reactions   Trilyte [Peg 3350 -Kcl-Na Bicarb-Nacl]     HEMATEMESIS   Vicodin [Hydrocodone-Acetaminophen] Nausea And Vomiting   PAST MEDICAL HISTORY Past Medical History:  Diagnosis Date   Anxiety and depression    Cataract    Hypercholesterolemia    Hypertension    Hypertensive retinopathy    OU    Hypothyroidism    Macular degeneration    OD   Osteoarthritis    PONV (postoperative nausea and vomiting)    Seasonal allergies    Past Surgical History:  Procedure Laterality Date   BIOPSY  10/01/2018   Procedure: BIOPSY;  Surgeon: Alyce Jubilee, MD;  Location: [facility];  Service: Endoscopy;;  gastric bx & gastric polyp   CATARACT EXTRACTION W/PHACO Right 12/23/2022   Procedure: CATARACT EXTRACTION PHACO AND INTRAOCULAR LENS PLACEMENT (IOC);  Surgeon: Ardeth Krabbe, MD;  Location: [facility];  Service: Ophthalmology;  Laterality: Right;  CDE: 11.88   CATARACT EXTRACTION W/PHACO Left 02/24/2023   Procedure: CATARACT EXTRACTION PHACO AND INTRAOCULAR LENS PLACEMENT (IOC);  Surgeon: Ardeth Krabbe, MD;  Location: [facility];  Service: Ophthalmology;  Laterality: Left;  CDE  5.82   COLONOSCOPY N/A 10/01/2018   Procedure: COLONOSCOPY;  Surgeon: Alyce Jubilee, MD;  Location: [facility];  Service: Endoscopy;  Laterality: N/A;  10:30am   ESOPHAGOGASTRODUODENOSCOPY N/A 10/01/2018   Procedure: ESOPHAGOGASTRODUODENOSCOPY (EGD);  Surgeon: Alyce Jubilee, MD;  Location: [facility];  Service: Endoscopy;  Laterality: N/A;   POLYPECTOMY  10/01/2018   Procedure: POLYPECTOMY;  Surgeon: Alyce Jubilee, MD;  Location: [facility];  Service: Endoscopy;;  colon    TUBAL LIGATION     UTERINE FIBROID SURGERY     FAMILY HISTORY Family History  Problem Relation Age of Onset   Cancer Mother        lung   Stroke Father    Stroke Brother    Colon cancer Neg Hx    SOCIAL HISTORY Social History   Tobacco Use   Smoking status: Former    Current packs/day: 0.00    Average packs/day: 0.3 packs/day for 4.0 years (1.0 ttl pk-yrs)    Types: Cigarettes    Start date: 11/04/1960    Quit date: 11/04/1964    Years since quitting: 59.2   Smokeless tobacco: Never  Vaping Use   Vaping status: Never Used  Substance Use Topics   Alcohol  use: No   Drug use: No       OPHTHALMIC EXAM:  Base Eye Exam      Visual Acuity (Snellen - Linear)       Right Left   Dist [place] 20/40 +1 20/30 -1   Dist ph [place] 20/40 +2 20/25 -1         Tonometry (Tonopen, 12:51 PM)       Right Left   Pressure 14 15         Pupils       Dark Light Shape React APD   Right 3 2 Round Brisk None   Left 3 2 Round Brisk None         Visual Fields (Counting fingers)       Left Right    Full Full         Extraocular Movement       Right Left  Full, Ortho Full, Ortho         Neuro/Psych     Oriented x3: Yes   Mood/Affect: Normal         Dilation     Right eye: 1.0% Mydriacyl , 2.5% Phenylephrine  @ 12:51 PM  Patient wanted only OD dilated. driving          Slit Lamp and Fundus Exam     Slit Lamp Exam       Right Left   Lids/Lashes Dermatochalasis - upper lid Dermatochalasis - upper lid   Conjunctiva/Sclera White and quiet White and quiet   Cornea Arcus, well healed cataract wound Arcus, early nasal band K, 1+PEE   Anterior Chamber Deep, 1-2+ fine cell / pigment moderate depth, with narrow angle temporally   Iris Round and well dilated Round and well dilated   Lens PC IOL in good position PC IOL in good postition   Anterior Vitreous Vitreous syneresis, Posterior vitreous detachment, Weiss ring Vitreous syneresis         Fundus Exam       Right Left   Disc Pink and Sharp, Compact Pink and Sharp, Tilted disc   C/D Ratio 0.2 0.3   Macula Flat, blunted foveal reflex; central SRF -- persistent, +drusen; +RPE mottling and clumping, small cluster of drusen and RPE mottling inferior fovea, no heme Flat, good foveal reflex, Retinal pigment epithelial mottling, superior para-foveal focal area of RPE atrophy -- stable, rare drusen, No heme or edema   Vessels attenuated, Tortuous Vascular attenuation, Tortuous   Periphery Attached, peripheral drusen / RPE changes nasally, peripheral cystoid degeneration Attached, very mild lattice at 0430, mild patch of Lattice degeneration at 0600 --  stable, scattered peripheral drusen, peripheral cystoid degeneration           IMAGING AND PROCEDURES  Imaging and Procedures for 12/11/17  OCT, Retina - OU - Both Eyes        Right Eye Quality was good. Central Foveal Thickness: 408. Progression has worsened. Findings include no IRF, abnormal foveal contour, retinal drusen , pigment epithelial detachment, subretinal fluid, outer retinal atrophy (Persistent central and inferior SRF and SRHM -- slightly increased).   Left Eye Quality was good. Central Foveal Thickness: 275. Progression has been stable. Findings include normal foveal contour, no IRF, no SRF, retinal drusen .   Notes  *Images captured and stored on drive  Diagnosis / Impression:  OD: Persistent central and inferior SRF and SRHM -- slightly increased OS: NFP, No IRF/SRF  Clinical management:  See below  Abbreviations: NFP - Normal foveal profile. CME - cystoid macular edema. PED - pigment epithelial detachment. IRF - intraretinal fluid. SRF - subretinal fluid. EZ - ellipsoid zone. ERM - epiretinal membrane. ORA - outer retinal atrophy. ORT - outer retinal tubulation. SRHM - subretinal hyper-reflective material       Intravitreal Injection, Pharmacologic Agent - OD - Right Eye       Time Out 01/31/2024. 2:21 PM. Confirmed correct patient, procedure, site, and patient consented.   Anesthesia Topical anesthesia was used. Anesthetic medications included Lidocaine  2%, Proparacaine 0.5%.   Procedure Preparation included 5% betadine  to ocular surface, eyelid speculum. A (30g) needle was used.   Injection: 8 mg aflibercept  8 MG/0.07ML   Route: Intravitreal, Site: Right Eye   NDC: [id number], Lot: [id number], Expiration date: 01/26/2025, Waste: 0 mL   Post-op Post injection exam found visual acuity of at least counting fingers. The patient tolerated the procedure well. There were no complications. The  patient received written and verbal post procedure care  education. Post injection medications were not given.             ASSESSMENT/PLAN:    ICD-10-CM   1. Exudative age-related macular degeneration of right eye with active choroidal neovascularization (HCC)  H35.3211 OCT, Retina - OU - Both Eyes    Intravitreal Injection, Pharmacologic Agent - OD - Right Eye    aflibercept  (EYLEA  HD) ophthalmic injection 8 mg    2. 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 Essential hypertension  I10     4. Hypertensive retinopathy of both eyes  H35.033     5. 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 Posterior vitreous detachment of right eye  H43.811     7. Pseudophakia  Z96.1       1,2. Exudative age related macular degeneration vs CSCR OD  - FA (03.02.19) without leakage / pooling into area of SRF - review of systems reveals significant stressors in life, but no exogenous steroid use - S/P IVA #1 OD (09.27.19),  #2 (10.25.19), #3 (11.22.19) -- minimal response -- IVA resistance  =============================== - S/P IVE OD #1 (12.20.19), #2 (01.20.20), #3 (02.18.20), #4 (07.20.20) #5 (09.01.20), #6 (10.13.20), #7 (11.20.20), #8 (01.20.21), #9 (02.17.21), #10 (03.19.21), #11 (04.20.21), #12 (05.25.21), #13 (6.23.21), #14 (07.27.21), #15 (08.30.21), #16 (9.28.21), #17 (10.26.21), #18 (11.29.21), #19 (01.11.22), #20 (02.08.22), #21 (3.8.22), #22 (04.05.22), #23 (5.10.22), #24 (06.21.22), #25 (8.2.22), #26 (9.20.22), #27 (11.15.22), #28 (01.17.23), #29 (03.29.23), #30 (06.21.23), #31 (09.27.23), #32 (01.16.24), #33 (05.07.24), #34 (06.18.24), #35 (07.30.24) -- IVE resistance ================================== - s/p IVV OD #1 (09.03.24), #2 (10.03.24), #3 (11.06.24), #4 (12.04.24) #5(01.02.25) #6 (01.31.25 -- sample), #7 (sample -- 02.28.25), #8 (04.02.25), #9 (05.02.25) -- IVV resistance  - lost to f/u from Feb 2020 to July 2020 due to COVID-19 concerns - today, BCVA OD decreased to 20/40 from 20/30 - OCT shows  persistent central and inferior SRF -- slightly increased at 4 wks **discussed decreased efficacy / resistance to Vabysmo  and potential benefit of switching medication to EyleaHD**   - recommend switching to IVE HD OD #1 today, 06.04.25 -- with follow up in 4 wks  - pt wishes to proceed with IVE HD OD   - RBA of procedure discussed, questions answered  - informed consent obtained   - IVE HD informed consent form signed and scanned on 6.04.25  - see procedure note  - Eylea4U benefits investigation initiated -- approved for 2025  - Vabysmo  approved for 2025   - [employer] funding fully reinstated as of 04.02.25  - EyleaHD approved for 2025  - f/u 4 weeks -- DFE/OCT, possible injection  3,4. Hypertensive retinopathy OU  - discussed importance of tight BP control  - stable  - monitor  5. Lattice degeneration OS  - patch of inferior lattice OS -- no RT, holes or SRF  - asymptomatic   - discussed findings, prognosis, and treatment options including observation  - monitor  6. PVD / vitreous syneresis OD-   - Discussed findings and prognosis  - No RT or RD on 360 peripheral exam  - Reviewed s/s of RT/RD  - strict return precautions for any such RT/RD symptoms  7. Pseudophakia OU  - s/p CE/IOL OD (Dr. Artemio Larry, 04.26.24, OS 02/24/23)  - IOL in good position, doing well  - monitor  Ophthalmic Meds Ordered this visit:  Meds ordered this encounter  Medications  aflibercept  (EYLEA  HD) ophthalmic injection 8 mg     Return in about 4 weeks (around 02/28/2024) for f/u exu ARMD OD, DFE, OCT, Possible Injxn.  There are no Patient Instructions on file for this visit.   This document serves as a record of services personally performed by Jeanice Millard, MD, PhD. It was created on their behalf by Angelia Kelp, an ophthalmic technician. The creation of this record is the provider's dictation and/or activities during the visit.    Electronically signed by: Angelia Kelp, OA, 01/31/24   5:35 PM  This document serves as a record of services personally performed by Jeanice Millard, MD, PhD. It was created on their behalf by Morley Arabia. Bevin Bucks, OA an ophthalmic technician. The creation of this record is the provider's dictation and/or activities during the visit.    Electronically signed by: Morley Arabia. Bevin Bucks, OA 01/31/24 5:35 PM  Jeanice Millard, M.D., Ph.D. Diseases & Surgery of the Retina and Vitreous [facility]  I have reviewed the above documentation for accuracy and completeness, and I agree with the above. Jeanice Millard, M.D., Ph.D. 01/31/24 5:36 PM   Abbreviations: M myopia (nearsighted); A astigmatism; H hyperopia (farsighted); P presbyopia; Mrx spectacle prescription;  CTL contact lenses; OD right eye; OS left eye; OU both eyes  XT exotropia; ET esotropia; PEK punctate epithelial keratitis; PEE punctate epithelial erosions; DES dry eye syndrome; MGD meibomian gland dysfunction; ATs artificial tears; PFAT's preservative free artificial tears; NSC nuclear sclerotic cataract; PSC posterior subcapsular cataract; ERM epi-retinal membrane; PVD posterior vitreous detachment; RD retinal detachment; DM diabetes mellitus; DR diabetic retinopathy; NPDR non-proliferative diabetic retinopathy; PDR proliferative diabetic retinopathy; CSME clinically significant macular edema; DME diabetic macular edema; dbh dot blot hemorrhages; CWS cotton wool spot; POAG primary open angle glaucoma; C/D cup-to-disc ratio; HVF humphrey visual field; GVF goldmann visual field; OCT optical coherence tomography; IOP intraocular pressure; BRVO Branch retinal vein occlusion; CRVO central retinal vein occlusion; CRAO central retinal artery occlusion; BRAO branch retinal artery occlusion; RT retinal tear; SB scleral buckle; PPV pars plana vitrectomy; VH Vitreous hemorrhage; PRP panretinal laser photocoagulation; IVK intravitreal kenalog; VMT vitreomacular traction; MH Macular hole;  NVD  neovascularization of the disc; NVE neovascularization elsewhere; AREDS age related eye disease study; ARMD age related macular degeneration; POAG primary open angle glaucoma; EBMD epithelial/anterior basement membrane dystrophy; ACIOL anterior chamber intraocular lens; IOL intraocular lens; PCIOL posterior chamber intraocular lens; Phaco/IOL phacoemulsification with intraocular lens placement; PRK photorefractive keratectomy; LASIK laser assisted in situ keratomileusis; HTN hypertension; DM diabetes mellitus; COPD chronic obstructive pulmonary disease

## 2024-01-29 ENCOUNTER — Encounter: Payer: Self-pay | Admitting: Urology

## 2024-01-29 ENCOUNTER — Ambulatory Visit: Admitting: Urology

## 2024-01-29 VITALS — BP 138/83 | HR 82

## 2024-01-29 DIAGNOSIS — N390 Urinary tract infection, site not specified: Secondary | ICD-10-CM

## 2024-01-29 DIAGNOSIS — Z87440 Personal history of urinary (tract) infections: Secondary | ICD-10-CM

## 2024-01-29 DIAGNOSIS — Z09 Encounter for follow-up examination after completed treatment for conditions other than malignant neoplasm: Secondary | ICD-10-CM

## 2024-01-29 LAB — MICROSCOPIC EXAMINATION: Bacteria, UA: NONE SEEN

## 2024-01-29 LAB — URINALYSIS, ROUTINE W REFLEX MICROSCOPIC
Bilirubin, UA: NEGATIVE
Glucose, UA: NEGATIVE
Ketones, UA: NEGATIVE
Nitrite, UA: NEGATIVE
Protein,UA: NEGATIVE
Specific Gravity, UA: <1.005 — ABNORMAL LOW (ref 1.005–1.030)
Urobilinogen, Ur: 0.2 mg/dL (ref 0.2–1.0)
pH, UA: 7 (ref 5.0–7.5)

## 2024-01-29 LAB — BLADDER SCAN AMB NON-IMAGING: Scan Result: 155

## 2024-01-29 MED ORDER — ESTRADIOL 0.1 MG/GM VA CREA
TOPICAL_CREAM | VAGINAL | 3 refills | Status: AC
Start: 2024-01-29 — End: ?

## 2024-01-29 NOTE — Progress Notes (Signed)
 Name: Darlene Crawford DOB: Feb 23, 1941 MRN: [id number]  History of Present Illness: Darlene Crawford is a 83 y.o. female who presents today as a new patient at [facility] [place]. All available relevant medical records have been reviewed.   She reports chief complaint of recurrent UTls.  Urine culture results in past 12 months: - 10/12/2023: Positive for Staphylococcus epidermidis - No additional urine culture results in past 12 months found per chart review.  Urinary Symptoms: She reports 2-3 UTl's in the last year. When present, UTI symptoms include urethral pressure, very mild dysuria, increased urinary urgency, frequency. She reports "slight" acute UTI symptoms today.  At baseline: She denies urinary urgency, frequency, dysuria, gross hematuria, hesitancy, straining to void, or sensations of incomplete emptying.  Reports distant history of several urethral dilations >40 years ago due to recurrent UTIs.  She reports urge incontinence for about 1 year if she holds her urine too long. She denies stress incontinence. She leaks mainly when she wakes up. Wears pads. She reports caffeine intake.  She denies history of pyelonephritis.  She denies history of kidney stones.  Vaginal / prolapse Symptoms: She denies vaginal bulge sensation.  She denies seeing a vaginal bulge but reports that her husband used to see one. She denies vaginal pain, bleeding, or discharge.  She denies use of topical vaginal estrogen cream.  Bowel Symptoms: She reports chronic constipation; denies taking laxatives, stool softeners, or fiber supplements.   Past OB/GYN History: OB History   No obstetric history on file.    She denies being sexually active.  She is post menopausal.  She denies history of hysterectomy.  Medications: Current Outpatient Medications  Medication Sig Dispense Refill   acidophilus (RISAQUAD) CAPS capsule Take 1 capsule by mouth daily.     ARTIFICIAL TEAR SOLUTION OP  Place 1 drop into both eyes daily as needed (dry eyes).     atorvastatin (LIPITOR) 10 MG tablet Take 10 mg by mouth at bedtime.      Cholecalciferol (VITAMIN D) 50 MCG (2000 UT) tablet Take 4,000 Units by mouth at bedtime.      Cyanocobalamin (B-12 PO) Take 1 Dose by mouth 2 (two) times a week.     estradiol (ESTRACE) 0.1 MG/GM vaginal cream Discard plastic applicator. Insert a blueberry size amount (approximately 1 gram) of cream on fingertip inside vagina at bedtime every night for 1 week then every other night. For long term use. 30 g 3   hydrochlorothiazide (HYDRODIURIL) 25 MG tablet Take 25 mg by mouth daily. 1/2 tablet am, 1/2 tablet pm     levothyroxine (SYNTHROID, LEVOTHROID) 100 MCG tablet Take 100 mcg by mouth daily before breakfast.     losartan (COZAAR) 100 MG tablet Take 100 mg by mouth daily.      Melatonin 10 MG CAPS Take 10 mg by mouth at bedtime.     Multiple Vitamins-Minerals (PRESERVISION AREDS 2 PO) Take 1 tablet by mouth at bedtime.      sertraline (ZOLOFT) 50 MG tablet Take 50 mg by mouth daily.     fluticasone (FLONASE) 50 MCG/ACT nasal spray Place 1 spray into both nostrils daily as needed for allergies or rhinitis. (Patient not taking: Reported on 01/29/2024)     loratadine (CLARITIN) 10 MG tablet Take 10 mg by mouth daily as needed for allergies. (Patient not taking: Reported on 01/29/2024)     sodium chloride  (OCEAN) 0.65 % SOLN nasal spray Place 1 spray into both nostrils as needed for congestion. (Patient  not taking: Reported on 01/29/2024)     Current Facility-Administered Medications  Medication Dose Route Frequency Provider Last Rate Last Admin   aflibercept  (EYLEA ) SOLN 2 mg  2 mg Intravitreal  Zamora, Brian, MD   2 mg at 08/19/18 0126   aflibercept  (EYLEA ) SOLN 2 mg  2 mg Intravitreal  Zamora, Brian, MD   2 mg at 09/19/18 5956   aflibercept  (EYLEA ) SOLN 2 mg  2 mg Intravitreal  Zamora, Brian, MD   2 mg at 10/16/18 1719   Bevacizumab  (AVASTIN ) SOLN 1.25 mg  1.25 mg  Intravitreal  Zamora, Brian, MD   1.25 mg at 05/28/18 3875   Bevacizumab  (AVASTIN ) SOLN 1.25 mg  1.25 mg Intravitreal  Zamora, Brian, MD   1.25 mg at 06/24/18 2336   Bevacizumab  (AVASTIN ) SOLN 1.25 mg  1.25 mg Intravitreal  Zamora, Brian, MD   1.25 mg at 07/20/18 1324    Allergies: Allergies  Allergen Reactions   Trilyte [Peg 3350 -Kcl-Na Bicarb-Nacl]     HEMATEMESIS   Vicodin [Hydrocodone-Acetaminophen] Nausea And Vomiting    Past Medical History:  Diagnosis Date   Anxiety and depression    Cataract    Hypercholesterolemia    Hypertension    Hypertensive retinopathy    OU   Hypothyroidism    Macular degeneration    OD   Osteoarthritis    PONV (postoperative nausea and vomiting)    Seasonal allergies    Past Surgical History:  Procedure Laterality Date   BIOPSY  10/01/2018   Procedure: BIOPSY;  Surgeon: Alyce Jubilee, MD;  [facility]: [facility];  Service: Endoscopy;;  gastric bx & gastric polyp   CATARACT EXTRACTION W/PHACO Right 12/23/2022   Procedure: CATARACT EXTRACTION PHACO AND INTRAOCULAR LENS PLACEMENT (IOC);  Surgeon: Ardeth Krabbe, MD;  Location: [facility];  Service: Ophthalmology;  Laterality: Right;  CDE: 11.88   CATARACT EXTRACTION W/PHACO Left 02/24/2023   Procedure: CATARACT EXTRACTION PHACO AND INTRAOCULAR LENS PLACEMENT (IOC);  Surgeon: Ardeth Krabbe, MD;  Location: [facility];  Service: Ophthalmology;  Laterality: Left;  CDE  5.82   COLONOSCOPY N/A 10/01/2018   Procedure: COLONOSCOPY;  Surgeon: Alyce Jubilee, MD;  Location: [facility];  Service: Endoscopy;  Laterality: N/A;  10:30am   ESOPHAGOGASTRODUODENOSCOPY N/A 10/01/2018   Procedure: ESOPHAGOGASTRODUODENOSCOPY (EGD);  Surgeon: Alyce Jubilee, MD;  Location: [facility];  Service: Endoscopy;  Laterality: N/A;   POLYPECTOMY  10/01/2018   Procedure: POLYPECTOMY;  Surgeon: Alyce Jubilee, MD;  Location: [facility];  Service: Endoscopy;;  colon    TUBAL LIGATION     UTERINE FIBROID SURGERY      Family History  Problem Relation Age of Onset   Cancer Mother        lung   Stroke Father    Stroke Brother    Colon cancer Neg Hx    Social History   Socioeconomic History   Marital status: Married    Spouse name: Not on file   Number of children: Not on file   Years of education: Not on file   Highest education level: Not on file  Occupational History   Not on file  Tobacco Use   Smoking status: Former    Current packs/day: 0.00    Average packs/day: 0.3 packs/day for 4.0 years (1.0 ttl pk-yrs)    Types: Cigarettes    Start date: 11/04/1960    Quit date: 11/04/1964    Years since quitting: 59.2   Smokeless tobacco: Never  Vaping Use   Vaping status: Never Used  Substance and Sexual Activity   Alcohol  use: No   Drug use: No   Sexual activity: Not Currently  Other Topics Concern   Not on file  Social History Narrative   Not on file   Social Drivers of Health   Financial Resource Strain: Not on file  Food Insecurity: No Food Insecurity (05/11/2022)   Hunger Vital Sign    Worried About Running Out of Food in the Last Year: Never true    Ran Out of Food in the Last Year: Never true  Transportation Needs: No Transportation Needs (05/11/2022)   PRAPARE - Administrator, Civil Service (Medical): No    Lack of Transportation (Non-Medical): No  Physical Activity: Not on file  Stress: Not on file  Social Connections: Not on file  Intimate Partner Violence: Not on file    SUBJECTIVE  Review of Systems Constitutional: Patient denies any unintentional weight loss or change in strength lntegumentary: Patient denies any rashes or pruritus Cardiovascular: Patient denies chest pain or syncope Respiratory: Patient denies shortness of breath Gastrointestinal: As per HPI Musculoskeletal: Patient denies muscle cramps or weakness Neurologic: Patient denies convulsions or seizures Allergic/Immunologic: Patient denies recent allergic  reaction(s) Hematologic/Lymphatic: Patient denies bleeding tendencies Endocrine: Patient denies heat/cold intolerance  GU: As per HPI.  OBJECTIVE Vitals:   01/29/24 1339  BP: 138/83  Pulse: 82   There is no height or weight on file to calculate BMI.  Physical Examination Constitutional: No obvious distress; patient is non-toxic appearing  Cardiovascular: No visible lower extremity edema.  Respiratory: The patient does not have audible wheezing/stridor; respirations do not appear labored  Gastrointestinal: Abdomen non-distended Musculoskeletal: Normal ROM of UEs  Skin: No obvious rashes/open sores  Neurologic: CN 2-12 grossly intact Psychiatric: Answered questions appropriately with normal affect  Hematologic/Lymphatic/Immunologic: No obvious bruises or sites of spontaneous bleeding  Urine microscopy: 6-10 WBC/hpf, 0-2 RBC/hpf, 0 bacteria PVR: 155 ml (pre-void)  ASSESSMENT Chronic UTI (urinary tract infection) - Plan: Urinalysis, Routine w reflex microscopic, BLADDER SCAN AMB NON-IMAGING, estradiol (ESTRACE) 0.1 MG/GM vaginal cream  History of UTls with insufficient urine culture data to formally validate recurrent UTI diagnosis:  We discussed the possible etiologies of recurrent UTls including ascending infection related to intercourse; vaginal atrophy; transmural infection that has been treated incompletely; urinary tract stones; incomplete bladder emptying with urinary stasis; kidney or bladder tumor; urethral diverticulum; and colonization of  vagina and urinary tract with pathologic, adherent organisms.   For UTI prevention advised starting topical vaginal estrogen cream. The rationale, appropriate use, and potential pros / cons were discussed in detail. Handout provided with additional recommendations / options for UTI prevention.   We agreed to plan for follow up in 6 weeks for recheck or sooner if needed. Patient verbalized understanding of and agreement with current plan.  All questions were answered.  PLAN Advised the following: 1. Start topical vaginal estrogen cream as prescribed. 2. Maintain adequate fluid intake daily to flush out the urinary tract. 3. Consider OTC supplements for UTI prevention. 4. No follow-ups on file.  Orders Placed This Encounter  Procedures   Urinalysis, Routine w reflex microscopic   BLADDER SCAN AMB NON-IMAGING   It has been explained that the patient is to follow regularly with their PCP in addition to all other providers involved in their care and to follow instructions provided by these respective offices. Patient advised to contact urology clinic if any urologic-pertaining questions, concerns, new  symptoms or problems arise in the interim period.  Patient Instructions  UTI prevention / management:  Difference between Urinalysis (urine dipstick test) and Urine culture:  Urinalysis (urine dipstick test): A quick office test used as an indicator to determine whether or not further testing is necessary (such as a urine culture, urine microscopy, etc.) The urinalysis cannot differentiate a true bacterial UTI or give a definitive diagnosis for the findings.  Urine culture: May be performed based on the findings of a urinalysis to evaluate for UTI. Grows out on a petri dish for 48-72 hours. Provides important information about: whether or not bacterial growth is present and if so: what the predominant bacteria is which antibiotics will work best against that bacteria That information is important so that we can diagnose and treat patients appropriately as there are other conditions which may mimic UTls which must not be missed (such as cancer, interstitial cystitis, stones, etc.). Assists us  with antibiotic stewardship to minimize patient's risk for developing antibiotic resistance (getting to a point where no antibiotics work anymore).  Options when UTI symptoms occur: 1. Call [facility] [place] and request to  speak with triage nurse (phone # [phone], select option 3). In accordance with clinic guidelines the nurse will determine next steps based on patient-reported symptoms, which may include: same-day lab visit to provide urine specimen, recommendation to schedule Urology office visit appointment for further evaluation, recommendation to proceed to ER, etc. 2. Call your Primary Care Provider (PCP) office to request urgent / same-day visit. Be sure to request for urine culture to be ordered and have results faxed to Urology (fax # [phone]).  3. Go to urgent care. Be sure to request for urine culture to be ordered and have results faxed to Urology (fax # [phone]).   For bladder pain/ burning with urination: - Can take over-the-counter Pyridium (phenazopyridine; commonly known under the "AZO" brand) for a few days as needed. Limit use to no more than 3 days consecutively due to risk for methemoglobinemia, liver function issues, and bone health damage with long term use of Pyridium. - Alternative: Prescription urinary analgesics (such as Uribel, Urogesic blue, Urelle, Uro-MP). Often expensive / poorly covered by insurance unfortunately.  Options / recommendations for UTI prevention: - Topical vaginal estrogen for vaginal atrophy (aka Genitourinary Syndrome of Menopause (GSM)). - Adequate daily fluid intake to flush out the urinary tract. - Go to the bathroom to urinate every 4-6 hours while awake to minimize urinary stasis / bacterial overgrowth in the bladder. - Proanthocyanidin (PAC) supplement 36 mg daily; must be soluble (insoluble form of PAC will be ineffective). Recommended brand: Ellura. This is an over-the-counter supplement (often must be found/ purchased online) supplement derived from cranberries with concentrated active component: Proanthocyanidin (PAC) 36 mg daily. Decreases bacterial adherence to bladder lining.  - D-mannose powder (2 grams daily). This is an over-the-counter  supplement which decreases bacterial adherence to bladder lining (it is a sugar that inhibits bacterial adherence to urothelial cells by binding to the pili of enteric bacteria). Take as per manufacturer recommendation. Can be used as an alternative or in addition to the concentrated cranberry supplement.  - Vitamin C supplement to acidify urine to minimize bacterial growth.  - Probiotic to maintain healthy vaginal microbiome to suppress bacteria at urethral opening. Brand recommendations: Estill Hemming (includes probiotic & D-mannose ), Feminine Balance (highest concentration of lactobacillus) or Hyperbiotic Pro 15.  Note for patients with diabetes:  - Be aware that D-mannose contains sugar.  Note  for patients with interstitial cystitis (IC):  - Patients with IC should typically avoid cranberry/ PAC supplements and Vitamin C supplements due to their acidity, which may exacerbate IC-related bladder pain. - Symptoms of true bacterial UTI can overlap / mimic symptoms of an IC flare up. Antibiotic use is NOT indicated for IC flare ups. Urine culture needed prior to antibiotic treatment for IC patients. The goal is to minimize your risk for developing antibiotic-resistant bacteria.    Vaginal atrophy I Genitourinary syndrome of menopause (GSM):  What it is: Changes in the vaginal environment (including the vulva and urethra) including: Thinning of the epithelium (skin/ mucosa surface) Can contribute to urinary urgency and frequency Can contribute to dryness, itching, irritation of the vulvar and vaginal tissue Can contribute to pain with intercourse Can contribute to physical changes of the labia, vulva, and vagina such as: Narrowing of the vaginal opening Decreased vaginal length Loss of labial architecture Labial adhesions Pale color of vulvovaginal tissue Loss of pubic hair Allows bacteria to become adherent Results in increased risk for urinary tract infection (UTI) due to bacterial overgrowth  and migration up the urethra into the bladder Change in vaginal pH (acid/ base balance) Allows for alteration / disruption of the normal bacterial flora / microbiome Results in increased risk for urinary tract infection (UTI) due to bacterial overgrowth  Treatment options: Over-the-counter lubricants (see list below). Prescription vaginal estrogen replacement. Options: Topical vaginal estrogen (estradiol) cream: (Estrace, Premarin, compounded) Apply as directed Estring vaginal ring Exchanged every 3 months (either at home or in office by provider) Vagifem vaginal tablet Inserted nightly for 2 weeks then twice a week (long term) lntrarosa vaginal suppository Vaginal DHEA: converts to estrogen in vaginal tissue without systemic effect Inserted nightly (long term) 3. Vaginal laser therapy (Mona Edwina Gram touch) Performed in 3 treatments each 6 weeks apart (available in our [place] office). Can feel like a sunburn for 3-4 days after each treatment until new skin heals in. Usually not covered by insurance. Estimated cost is $1500 for all 3 sessions.  FYI regarding prescription vaginal estrogen treatment options: - All topical vaginal estrogen replacement options are equivalent in terms of efficacy. - Topical vaginal estrogen replacement will take about 3 months to be effective. - OK to have sex with any of the topical vaginal estrogen replacement options. - Topical vaginal estrogen replacement may sting/burn initially due to severe dryness, which will improve with ongoing treatment. - Studies have demonstrated negligible systemic absorption of low-dose intravaginal estrogen cream therefore the risk for cancer development or recurrence with this medication is minimal.  Topical vaginal estrogen cream safe to use with breast cancer history [email or web address]  Topical vaginal estrogen cream safe to use with  blood clot history [email or web address]   Lubricants and Moisturizers for Treating Genitourinary Syndrome of Menopause and Vulvovaginal Atrophy Treatment Comments I Available Products   Lubricants   Water -based Ingredients: Deionized water , glycerin, propylene glycol; latex safe; rare irritation; dry out with extended sexual activity Astroglide, Good Clean Love, K-Y Jelly, Natural, Organic, Pink, Sliquid, Sylk, Yes    Oil Based Ingredients: avocado, olive, peanut, corn; latex safe; can be used with silicone products; staining; safe (unless peanut allergy); non-irritating Coconut oil, vegetable oil, vitamin E oil  Silicone-Based Ingredients: Silicone polymers; staining; typically nonirritating, long lasting; waterproof; should not be used with silicone dilators, sexual toys, or gynecologic products Astroglide X, Oceanus Ultra Pure, Pink Silicone, Pjur Eros, Replens Silky Smooth, Silicone Premium JO, SKYN, Uberlube, [employer] Based Minimize harm to  sperm motility; designed for couples trying to conceive:  Astroglide TTC, Conceive Plus, PreSeed, Yes Baby  Fertility Friendly Minimize harm to sperm motility; designed for couples trying to conceive:  Astroglide, TTC, Conceive Plus, PreSeed, Yes Baby  Vaginal Moisturizers   Vaginal Moisturizers For maintenance use 1 to 3 times weekly; can benefit women with dryness, chafing with AOL, and recurrent vaginal infections irrespective of sexual activity timing Balance Active Menopause Vaginal Moisturizing Lubricant, Canesintima Intimate Moisturizer, Replens, Rephresh, Sylk Natural Intimate Moisturizer, Yes Vaginal Moisturizer  Hybrids Properties of both water  and silicone-based products (combination of a vaginal lubricant and moisturizer); Non-irritating; good option for women with  allergies and sensitivities Lubrigyn, Luvena  Suppositories Hyaluronic acid to retain moisture Revaree  Vulvar Soothing Creams/Oils    Medicated Creams Pain and burn relief; Ingredients: 4% Lidocaine , Aloe Vera gel Releveum ([place])  Non-Medicated Creams For anti-itch and moisture/maintenance; Ingredients: Coconut oil, Avocado oil, Shea Butter, Olive oil, Vitamin E Vajuvenate, Vmagic  Oils for moisture/maintenance: Coconut oil, Vitamin E oil, Emu oil     Electronically signed by:  Lauretta Ponto, MSN, FNP-C, CUNP 01/29/2024 3:16 PM

## 2024-01-29 NOTE — Patient Instructions (Signed)
 UTI prevention / management:  Difference between Urinalysis (urine dipstick test) and Urine culture:  Urinalysis (urine dipstick test): A quick office test used as an indicator to determine whether or not further testing is necessary (such as a urine culture, urine microscopy, etc.) The urinalysis cannot differentiate a true bacterial UTI or give a definitive diagnosis for the findings.  Urine culture: May be performed based on the findings of a urinalysis to evaluate for UTI. Grows out on a petri dish for 48-72 hours. Provides important information about: whether or not bacterial growth is present and if so: what the predominant bacteria is which antibiotics will work best against that bacteria That information is important so that we can diagnose and treat patients appropriately as there are other conditions which may mimic UTls which must not be missed (such as cancer, interstitial cystitis, stones, etc.). Assists us  with antibiotic stewardship to minimize patient's risk for developing antibiotic resistance (getting to a point where no antibiotics work anymore).  Options when UTI symptoms occur: 1. Call [facility] [place] and request to speak with triage nurse (phone # [phone], select option 3). In accordance with clinic guidelines the nurse will determine next steps based on patient-reported symptoms, which may include: same-day lab visit to provide urine specimen, recommendation to schedule Urology office visit appointment for further evaluation, recommendation to proceed to ER, etc. 2. Call your Primary Care Provider (PCP) office to request urgent / same-day visit. Be sure to request for urine culture to be ordered and have results faxed to Urology (fax # [phone]).  3. Go to urgent care. Be sure to request for urine culture to be ordered and have results faxed to Urology (fax # [phone]).   For bladder pain/ burning with urination: - Can take over-the-counter  Pyridium (phenazopyridine; commonly known under the "AZO" brand) for a few days as needed. Limit use to no more than 3 days consecutively due to risk for methemoglobinemia, liver function issues, and bone health damage with long term use of Pyridium. - Alternative: Prescription urinary analgesics (such as Uribel, Urogesic blue, Urelle, Uro-MP). Often expensive / poorly covered by insurance unfortunately.  Options / recommendations for UTI prevention: - Topical vaginal estrogen for vaginal atrophy (aka Genitourinary Syndrome of Menopause (GSM)). - Adequate daily fluid intake to flush out the urinary tract. - Go to the bathroom to urinate every 4-6 hours while awake to minimize urinary stasis / bacterial overgrowth in the bladder. - Proanthocyanidin (PAC) supplement 36 mg daily; must be soluble (insoluble form of PAC will be ineffective). Recommended brand: Ellura. This is an over-the-counter supplement (often must be found/ purchased online) supplement derived from cranberries with concentrated active component: Proanthocyanidin (PAC) 36 mg daily. Decreases bacterial adherence to bladder lining.  - D-mannose powder (2 grams daily). This is an over-the-counter supplement which decreases bacterial adherence to bladder lining (it is a sugar that inhibits bacterial adherence to urothelial cells by binding to the pili of enteric bacteria). Take as per manufacturer recommendation. Can be used as an alternative or in addition to the concentrated cranberry supplement.  - Vitamin C supplement to acidify urine to minimize bacterial growth.  - Probiotic to maintain healthy vaginal microbiome to suppress bacteria at urethral opening. Brand recommendations: Estill Hemming (includes probiotic & D-mannose ), Feminine Balance (highest concentration of lactobacillus) or Hyperbiotic Pro 15.  Note for patients with diabetes:  - Be aware that D-mannose contains sugar.  Note for patients with interstitial cystitis (IC):  -  Patients with IC should  typically avoid cranberry/ PAC supplements and Vitamin C supplements due to their acidity, which may exacerbate IC-related bladder pain. - Symptoms of true bacterial UTI can overlap / mimic symptoms of an IC flare up. Antibiotic use is NOT indicated for IC flare ups. Urine culture needed prior to antibiotic treatment for IC patients. The goal is to minimize your risk for developing antibiotic-resistant bacteria.    Vaginal atrophy I Genitourinary syndrome of menopause (GSM):  What it is: Changes in the vaginal environment (including the vulva and urethra) including: Thinning of the epithelium (skin/ mucosa surface) Can contribute to urinary urgency and frequency Can contribute to dryness, itching, irritation of the vulvar and vaginal tissue Can contribute to pain with intercourse Can contribute to physical changes of the labia, vulva, and vagina such as: Narrowing of the vaginal opening Decreased vaginal length Loss of labial architecture Labial adhesions Pale color of vulvovaginal tissue Loss of pubic hair Allows bacteria to become adherent Results in increased risk for urinary tract infection (UTI) due to bacterial overgrowth and migration up the urethra into the bladder Change in vaginal pH (acid/ base balance) Allows for alteration / disruption of the normal bacterial flora / microbiome Results in increased risk for urinary tract infection (UTI) due to bacterial overgrowth  Treatment options: Over-the-counter lubricants (see list below). Prescription vaginal estrogen replacement. Options: Topical vaginal estrogen (estradiol) cream: (Estrace, Premarin, compounded) Apply as directed Estring vaginal ring Exchanged every 3 months (either at home or in office by provider) Vagifem vaginal tablet Inserted nightly for 2 weeks then twice a week (long term) lntrarosa vaginal suppository Vaginal DHEA: converts to estrogen in vaginal tissue without systemic  effect Inserted nightly (long term) 3. Vaginal laser therapy (Mona Edwina Gram touch) Performed in 3 treatments each 6 weeks apart (available in our [place] office). Can feel like a sunburn for 3-4 days after each treatment until new skin heals in. Usually not covered by insurance. Estimated cost is $1500 for all 3 sessions.  FYI regarding prescription vaginal estrogen treatment options: - All topical vaginal estrogen replacement options are equivalent in terms of efficacy. - Topical vaginal estrogen replacement will take about 3 months to be effective. - OK to have sex with any of the topical vaginal estrogen replacement options. - Topical vaginal estrogen replacement may sting/burn initially due to severe dryness, which will improve with ongoing treatment. - Studies have demonstrated negligible systemic absorption of low-dose intravaginal estrogen cream therefore the risk for cancer development or recurrence with this medication is minimal.  Topical vaginal estrogen cream safe to use with breast cancer history [email or web address]  Topical vaginal estrogen cream safe to use with blood clot history [email or web address]   Lubricants and Moisturizers for Treating Genitourinary Syndrome of Menopause and Vulvovaginal Atrophy Treatment Comments I Available Products   Lubricants   Water -based Ingredients: Deionized water , glycerin, propylene glycol; latex safe; rare irritation; dry out with extended sexual activity Astroglide, Good Clean Love, K-Y Jelly, Natural, Organic, Pink, Sliquid, Sylk, Yes    Oil Based Ingredients: avocado, olive, peanut, corn; latex safe; can be used with silicone products; staining; safe (unless peanut allergy); non-irritating Coconut  oil, vegetable oil, vitamin E oil  Silicone-Based Ingredients: Silicone polymers; staining; typically nonirritating, long lasting; waterproof; should not be used with silicone dilators, sexual toys, or gynecologic products Astroglide X, Oceanus Ultra Pure, Pink Silicone, Pjur Eros, Replens Silky Smooth, Silicone Premium JO, SKYN, Uberlube, [employer] Based Minimize harm to sperm motility; designed for couples trying to conceive:  Astroglide TTC, Conceive  Plus, PreSeed, Yes Baby  Fertility Friendly Minimize harm to sperm motility; designed for couples trying to conceive:  Astroglide, TTC, Conceive Plus, PreSeed, Yes Baby  Vaginal Moisturizers   Vaginal Moisturizers For maintenance use 1 to 3 times weekly; can benefit women with dryness, chafing with AOL, and recurrent vaginal infections irrespective of sexual activity timing Balance Active Menopause Vaginal Moisturizing Lubricant, Canesintima Intimate Moisturizer, Replens, Rephresh, Sylk Natural Intimate Moisturizer, Yes Vaginal Moisturizer  Hybrids Properties of both water  and silicone-based products (combination of a vaginal lubricant and moisturizer); Non-irritating; good option for women with allergies and sensitivities Lubrigyn, Luvena  Suppositories Hyaluronic acid to retain moisture Revaree  Vulvar Soothing Creams/Oils    Medicated Creams Pain and burn relief; Ingredients: 4% Lidocaine , Aloe Vera gel Releveum ([place])  Non-Medicated Creams For anti-itch and moisture/maintenance; Ingredients: Coconut oil, Avocado oil, Shea Butter, Olive oil, Vitamin E Vajuvenate, Vmagic  Oils for moisture/maintenance: Coconut oil, Vitamin E oil, Emu oil

## 2024-01-29 NOTE — Progress Notes (Signed)
 scan

## 2024-01-31 ENCOUNTER — Ambulatory Visit (INDEPENDENT_AMBULATORY_CARE_PROVIDER_SITE_OTHER): Admitting: Ophthalmology

## 2024-01-31 ENCOUNTER — Encounter (INDEPENDENT_AMBULATORY_CARE_PROVIDER_SITE_OTHER): Payer: Self-pay | Admitting: Ophthalmology

## 2024-01-31 DIAGNOSIS — I10 Essential (primary) hypertension: Secondary | ICD-10-CM | POA: Diagnosis not present

## 2024-01-31 DIAGNOSIS — Z961 Presence of intraocular lens: Secondary | ICD-10-CM | POA: Diagnosis not present

## 2024-01-31 DIAGNOSIS — H35033 Hypertensive retinopathy, bilateral: Secondary | ICD-10-CM

## 2024-01-31 DIAGNOSIS — H35412 Lattice degeneration of retina, left eye: Secondary | ICD-10-CM

## 2024-01-31 DIAGNOSIS — H35711 Central serous chorioretinopathy, right eye: Secondary | ICD-10-CM

## 2024-01-31 DIAGNOSIS — H353211 Exudative age-related macular degeneration, right eye, with active choroidal neovascularization: Secondary | ICD-10-CM

## 2024-01-31 DIAGNOSIS — H43811 Vitreous degeneration, right eye: Secondary | ICD-10-CM | POA: Diagnosis not present

## 2024-01-31 MED ORDER — AFLIBERCEPT 8 MG/0.07ML IZ SOLN
8.0000 mg | INTRAVITREAL | Status: AC | PRN
Start: 2024-01-31 — End: 2024-01-31
  Administered 2024-01-31: 8 mg via INTRAVITREAL

## 2024-02-21 NOTE — Progress Notes (Signed)
 [facility] - Clinic Note  02/28/2024     CHIEF COMPLAINT Patient presents for Retina Follow Up  HISTORY OF PRESENT ILLNESS: Darlene Crawford is a 83 y.o. 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  This started 4 weeks ago.  I, the attending physician,  performed the HPI with the patient and updated documentation appropriately.        Comments   Patient here for 4 weeks retina follow up for exu ARMD OD. Patient states vision about the same. No eye pain. Uses AT's prn.      Last edited by Valdemar Rogue, MD on 02/28/2024  3:37 PM.    Pt states she hasn't noticed much change in vision  Referring physician: Lori Thedora BROCKS [address] [place],  [place] [address]  HISTORICAL INFORMATION:  Selected notes from the MEDICAL RECORD NUMBER Referred by Dr. FABIENE Moats for concern of ARMD OD with progression to CNVM vs CSR   CURRENT MEDICATIONS: Current Outpatient Medications (Ophthalmic Drugs)  Medication Sig   ARTIFICIAL TEAR SOLUTION OP Place 1 drop into both eyes daily as needed (dry eyes).   Current Facility-Administered Medications (Ophthalmic Drugs)  Medication Route   aflibercept  (EYLEA ) SOLN 2 mg Intravitreal   aflibercept  (EYLEA ) SOLN 2 mg Intravitreal   aflibercept  (EYLEA ) SOLN 2 mg Intravitreal   Current Outpatient Medications (Other)  Medication Sig   atorvastatin (LIPITOR) 10 MG tablet Take 10 mg by mouth at bedtime.    Cholecalciferol (VITAMIN D) 50 MCG (2000 UT) tablet Take 4,000 Units by mouth at bedtime.    Cyanocobalamin (B-12 PO) Take 1 Dose by mouth 2 (two) times a week.   estradiol  (ESTRACE ) 0.1 MG/GM vaginal cream Discard plastic applicator. Insert a blueberry size amount (approximately 1 gram) of cream on fingertip inside vagina at bedtime every night for 1 week then every other night. For long term use.   levothyroxine (SYNTHROID, LEVOTHROID) 100 MCG tablet Take  100 mcg by mouth daily before breakfast.   losartan (COZAAR) 100 MG tablet Take 100 mg by mouth daily.    Melatonin 10 MG CAPS Take 10 mg by mouth at bedtime.   Multiple Vitamins-Minerals (PRESERVISION AREDS 2 PO) Take 1 tablet by mouth at bedtime.    acidophilus (RISAQUAD) CAPS capsule Take 1 capsule by mouth daily. (Patient not taking: Reported on 02/28/2024)   fluticasone (FLONASE) 50 MCG/ACT nasal spray Place 1 spray into both nostrils daily as needed for allergies or rhinitis. (Patient not taking: Reported on 02/28/2024)   hydrochlorothiazide (HYDRODIURIL) 25 MG tablet Take 25 mg by mouth daily. 1/2 tablet am, 1/2 tablet pm (Patient not taking: Reported on 02/28/2024)   loratadine (CLARITIN) 10 MG tablet Take 10 mg by mouth daily as needed for allergies. (Patient not taking: Reported on 01/31/2024)   sertraline (ZOLOFT) 50 MG tablet Take 50 mg by mouth daily. (Patient not taking: Reported on 02/28/2024)   sodium chloride  (OCEAN) 0.65 % SOLN nasal spray Place 1 spray into both nostrils as needed for congestion. (Patient not taking: Reported on 02/28/2024)   Current Facility-Administered Medications (Other)  Medication Route   Bevacizumab  (AVASTIN ) SOLN 1.25 mg Intravitreal   Bevacizumab  (AVASTIN ) SOLN 1.25 mg Intravitreal   Bevacizumab  (AVASTIN ) SOLN 1.25 mg Intravitreal   REVIEW OF SYSTEMS: ROS   Positive for: Genitourinary, Musculoskeletal, Endocrine, Eyes, Psychiatric, Allergic/Imm Negative for: Constitutional, Gastrointestinal,  Neurological, Skin, HENT, Cardiovascular, Respiratory, Heme/Lymph Last edited by Orval Asberry RAMAN, COA on 02/28/2024  1:11 PM.     ALLERGIES Allergies  Allergen Reactions   Trilyte [Peg 3350 -Kcl-Na Bicarb-Nacl]     HEMATEMESIS   Vicodin [Hydrocodone-Acetaminophen] Nausea And Vomiting   PAST MEDICAL HISTORY Past Medical History:  Diagnosis Date   Anxiety and depression    Cataract    Hypercholesterolemia    Hypertension    Hypertensive retinopathy    OU    Hypothyroidism    Macular degeneration    OD   Osteoarthritis    PONV (postoperative nausea and vomiting)    Seasonal allergies    Past Surgical History:  Procedure Laterality Date   BIOPSY  10/01/2018   Procedure: BIOPSY;  Surgeon: Harvey Margo CROME, MD;  Location: [facility];  Service: Endoscopy;;  gastric bx & gastric polyp   CATARACT EXTRACTION W/PHACO Right 12/23/2022   Procedure: CATARACT EXTRACTION PHACO AND INTRAOCULAR LENS PLACEMENT (IOC);  Surgeon: Juli Blunt, MD;  Location: [facility];  Service: Ophthalmology;  Laterality: Right;  CDE: 11.88   CATARACT EXTRACTION W/PHACO Left 02/24/2023   Procedure: CATARACT EXTRACTION PHACO AND INTRAOCULAR LENS PLACEMENT (IOC);  Surgeon: Juli Blunt, MD;  Location: [facility];  Service: Ophthalmology;  Laterality: Left;  CDE  5.82   COLONOSCOPY N/A 10/01/2018   Procedure: COLONOSCOPY;  Surgeon: Harvey Margo CROME, MD;  Location: [facility];  Service: Endoscopy;  Laterality: N/A;  10:30am   ESOPHAGOGASTRODUODENOSCOPY N/A 10/01/2018   Procedure: ESOPHAGOGASTRODUODENOSCOPY (EGD);  Surgeon: Harvey Margo CROME, MD;  Location: [facility];  Service: Endoscopy;  Laterality: N/A;   POLYPECTOMY  10/01/2018   Procedure: POLYPECTOMY;  Surgeon: Harvey Margo CROME, MD;  Location: [facility];  Service: Endoscopy;;  colon    TUBAL LIGATION     UTERINE FIBROID SURGERY     FAMILY HISTORY Family History  Problem Relation Age of Onset   Cancer Mother        lung   Stroke Father    Stroke Brother    Colon cancer Neg Hx    SOCIAL HISTORY Social History   Tobacco Use   Smoking status: Former    Current packs/day: 0.00    Average packs/day: 0.3 packs/day for 4.0 years (1.0 ttl pk-yrs)    Types: Cigarettes    Start date: 11/04/1960    Quit date: 11/04/1964    Years since quitting: 59.3   Smokeless tobacco: Never  Vaping Use   Vaping status: Never Used  Substance Use Topics   Alcohol  use: No   Drug use: No       OPHTHALMIC EXAM:  Base Eye Exam      Visual Acuity (Snellen - Linear)       Right Left   Dist [place] 20/40 -1 20/25         Tonometry (Tonopen, 1:09 PM)       Right Left   Pressure 13 12         Pupils       Dark Light Shape React APD   Right 3 2 Round Brisk None   Left 3 2 Round Brisk None         Visual Fields (Counting fingers)       Left Right    Full Full         Extraocular Movement       Right Left    Full, Ortho Full, Ortho  Neuro/Psych     Oriented x3: Yes   Mood/Affect: Normal         Dilation     Right eye: 1.0% Mydriacyl , 2.5% Phenylephrine  @ 1:08 PM  Patient wanted only OD dilated.          Slit Lamp and Fundus Exam     Slit Lamp Exam       Right Left   Lids/Lashes Dermatochalasis - upper lid Dermatochalasis - upper lid   Conjunctiva/Sclera White and quiet White and quiet   Cornea Arcus, well healed cataract wound Arcus, early nasal band K, 1+PEE   Anterior Chamber Deep, 1-2+ fine cell / pigment moderate depth, with narrow angle temporally   Iris Round and well dilated Round and well dilated   Lens PC IOL in good position PC IOL in good postition   Anterior Vitreous Vitreous syneresis, Posterior vitreous detachment, Weiss ring Vitreous syneresis         Fundus Exam       Right Left   Disc Pink and Sharp, Compact Pink and Sharp, Tilted disc   C/D Ratio 0.2 0.3   Macula Flat, blunted foveal reflex; central SRF -- improved, +drusen; +RPE mottling and clumping, small cluster of drusen and RPE mottling inferior fovea, no heme Flat, good foveal reflex, Retinal pigment epithelial mottling, superior para-foveal focal area of RPE atrophy -- stable, rare drusen, No heme or edema   Vessels attenuated, Tortuous Vascular attenuation, Tortuous   Periphery Attached, peripheral drusen / RPE changes nasally, peripheral cystoid degeneration Attached, very mild lattice at 0430, mild patch of Lattice degeneration at 0600 -- stable, scattered peripheral drusen, peripheral  cystoid degeneration           IMAGING AND PROCEDURES  Imaging and Procedures for 12/11/17  OCT, Retina - OU - Both Eyes       Right Eye Quality was good. Central Foveal Thickness: 318. Progression has improved. Findings include no IRF, abnormal foveal contour, retinal drusen , pigment epithelial detachment, subretinal fluid, outer retinal atrophy (Interval improvement in central and inferior SRF and SRHM).   Left Eye Quality was good. Central Foveal Thickness: 275. Progression has been stable. Findings include normal foveal contour, no IRF, no SRF, retinal drusen .   Notes *Images captured and stored on drive  Diagnosis / Impression:  OD: Interval improvement in central and inferior SRF and SRHM OS: NFP, No IRF/SRF  Clinical management:  See below  Abbreviations: NFP - Normal foveal profile. CME - cystoid macular edema. PED - pigment epithelial detachment. IRF - intraretinal fluid. SRF - subretinal fluid. EZ - ellipsoid zone. ERM - epiretinal membrane. ORA - outer retinal atrophy. ORT - outer retinal tubulation. SRHM - subretinal hyper-reflective material       Intravitreal Injection, Pharmacologic Agent - OD - Right Eye       Time Out 02/28/2024. 1:48 PM. Confirmed correct patient, procedure, site, and patient consented.   Anesthesia Topical anesthesia was used. Anesthetic medications included Lidocaine  2%, Proparacaine 0.5%.   Procedure Preparation included 5% betadine  to ocular surface, eyelid speculum. A (30g) needle was used.   Injection: 8 mg aflibercept  8 MG/0.07ML   Route: Intravitreal, Site: Right Eye   NDC: [id number], Lot: [id number], Expiration date: 01/26/2025, Waste: 0 mL   Post-op Post injection exam found visual acuity of at least counting fingers. The patient tolerated the procedure well. There were no complications. The patient received written and verbal post procedure care education. Post injection medications were not given.  ASSESSMENT/PLAN:    ICD-10-CM   1. Exudative age-related macular degeneration of right eye with active choroidal neovascularization (HCC)  H35.3211 OCT, Retina - OU - Both Eyes    Intravitreal Injection, Pharmacologic Agent - OD - Right Eye    aflibercept  (EYLEA  HD) ophthalmic injection 8 mg    2. 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 Essential hypertension  I10     4. Hypertensive retinopathy of both eyes  H35.033     5. 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 Posterior vitreous detachment of right eye  H43.811     7. Pseudophakia  Z96.1      1,2. Exudative age related macular degeneration vs CSCR OD  - FA (03.02.19) without leakage / pooling into area of SRF - review of systems reveals significant stressors in life, but no exogenous steroid use - S/P IVA #1 OD (09.27.19),  #2 (10.25.19), #3 (11.22.19) -- minimal response -- IVA resistance  =============================== - S/P IVE OD #1 (12.20.19), #2 (01.20.20), #3 (02.18.20), #4 (07.20.20) #5 (09.01.20), #6 (10.13.20), #7 (11.20.20), #8 (01.20.21), #9 (02.17.21), #10 (03.19.21), #11 (04.20.21), #12 (05.25.21), #13 (6.23.21), #14 (07.27.21), #15 (08.30.21), #16 (9.28.21), #17 (10.26.21), #18 (11.29.21), #19 (01.11.22), #20 (02.08.22), #21 (3.8.22), #22 (04.05.22), #23 (5.10.22), #24 (06.21.22), #25 (8.2.22), #26 (9.20.22), #27 (11.15.22), #28 (01.17.23), #29 (03.29.23), #30 (06.21.23), #31 (09.27.23), #32 (01.16.24), #33 (05.07.24), #34 (06.18.24), #35 (07.30.24) -- IVE resistance ================================== - s/p IVV OD #1 (09.03.24), #2 (10.03.24), #3 (11.06.24), #4 (12.04.24) #5(01.02.25) #6 (01.31.25 -- sample), #7 (sample -- 02.28.25), #8 (04.02.25), #9 (05.02.25) -- IVV resistance ================================== - s/p IVE HD #1 (06.04.25)  - lost to f/u from Feb 2020 to July 2020 due to COVID-19 concerns - today, BCVA OD stable at 20/40 - OCT shows interval improvement in  central and inferior SRF at 4 wks -- good response to IVE HD  - recommend IVE HD OD #2 today, 07.02.25 -- with follow up in 4 wks  - pt wishes to proceed with IVE HD OD   - RBA of procedure discussed, questions answered  - informed consent obtained   - IVE HD informed consent form signed and scanned on 6.04.25  - see procedure note  - Eylea4U benefits investigation initiated -- approved for 2025  - Vabysmo  approved for 2025   - [employer] funding fully reinstated as of 04.02.25  - EyleaHD approved for 2025  - f/u 4 weeks -- DFE/OCT, possible injection  3,4. Hypertensive retinopathy OU  - discussed importance of tight BP control  - stable  - monitor  5. Lattice degeneration OS  - patch of inferior lattice OS -- no RT, holes or SRF  - asymptomatic   - discussed findings, prognosis, and treatment options including observation  - monitor  6. PVD / vitreous syneresis OD-   - Discussed findings and prognosis  - No RT or RD on 360 peripheral exam  - Reviewed s/s of RT/RD  - strict return precautions for any such RT/RD symptoms  7. Pseudophakia OU  - s/p CE/IOL OD (Dr. Juli, 04.26.24, OS 02/24/23)  - IOL in good position, doing well  - monitor  Ophthalmic Meds Ordered this visit:  Meds ordered this encounter  Medications   aflibercept  (EYLEA  HD) ophthalmic injection 8 mg     Return in about 4 weeks (around 03/27/2024) for ex ARMD OD, Dilated Exam, OCT, Possible Injxn.  There are no Patient Instructions on file  for this visit.   This document serves as a record of services personally performed by Redell JUDITHANN Hans, MD, PhD. It was created on their behalf by Almetta Pesa, an ophthalmic technician. The creation of this record is the provider's dictation and/or activities during the visit.    Electronically signed by: Almetta Pesa, OA, 02/28/24  3:39 PM   This document serves as a record of services personally performed by Redell JUDITHANN Hans, MD, PhD. It was created on their  behalf by Alan PARAS. Delores, OA an ophthalmic technician. The creation of this record is the provider's dictation and/or activities during the visit.    Electronically signed by: Alan PARAS. Delores, OA 02/28/24 3:39 PM  Redell JUDITHANN Hans, M.D., Ph.D. Diseases & Surgery of the Retina and Vitreous [facility]  I have reviewed the above documentation for accuracy and completeness, and I agree with the above. Redell JUDITHANN Hans, M.D., Ph.D. 02/28/24 3:46 PM   Abbreviations: M myopia (nearsighted); A astigmatism; H hyperopia (farsighted); P presbyopia; Mrx spectacle prescription;  CTL contact lenses; OD right eye; OS left eye; OU both eyes  XT exotropia; ET esotropia; PEK punctate epithelial keratitis; PEE punctate epithelial erosions; DES dry eye syndrome; MGD meibomian gland dysfunction; ATs artificial tears; PFAT's preservative free artificial tears; NSC nuclear sclerotic cataract; PSC posterior subcapsular cataract; ERM epi-retinal membrane; PVD posterior vitreous detachment; RD retinal detachment; DM diabetes mellitus; DR diabetic retinopathy; NPDR non-proliferative diabetic retinopathy; PDR proliferative diabetic retinopathy; CSME clinically significant macular edema; DME diabetic macular edema; dbh dot blot hemorrhages; CWS cotton wool spot; POAG primary open angle glaucoma; C/D cup-to-disc ratio; HVF humphrey visual field; GVF goldmann visual field; OCT optical coherence tomography; IOP intraocular pressure; BRVO Branch retinal vein occlusion; CRVO central retinal vein occlusion; CRAO central retinal artery occlusion; BRAO branch retinal artery occlusion; RT retinal tear; SB scleral buckle; PPV pars plana vitrectomy; VH Vitreous hemorrhage; PRP panretinal laser photocoagulation; IVK intravitreal kenalog; VMT vitreomacular traction; MH Macular hole;  NVD neovascularization of the disc; NVE neovascularization elsewhere; AREDS age related eye disease study; ARMD age related macular  degeneration; POAG primary open angle glaucoma; EBMD epithelial/anterior basement membrane dystrophy; ACIOL anterior chamber intraocular lens; IOL intraocular lens; PCIOL posterior chamber intraocular lens; Phaco/IOL phacoemulsification with intraocular lens placement; PRK photorefractive keratectomy; LASIK laser assisted in situ keratomileusis; HTN hypertension; DM diabetes mellitus; COPD chronic obstructive pulmonary disease

## 2024-02-28 ENCOUNTER — Encounter (INDEPENDENT_AMBULATORY_CARE_PROVIDER_SITE_OTHER): Payer: Self-pay | Admitting: Ophthalmology

## 2024-02-28 ENCOUNTER — Ambulatory Visit (INDEPENDENT_AMBULATORY_CARE_PROVIDER_SITE_OTHER): Admitting: Ophthalmology

## 2024-02-28 DIAGNOSIS — H353211 Exudative age-related macular degeneration, right eye, with active choroidal neovascularization: Secondary | ICD-10-CM | POA: Diagnosis not present

## 2024-02-28 DIAGNOSIS — H35412 Lattice degeneration of retina, left eye: Secondary | ICD-10-CM | POA: Diagnosis not present

## 2024-02-28 DIAGNOSIS — Z961 Presence of intraocular lens: Secondary | ICD-10-CM

## 2024-02-28 DIAGNOSIS — H35711 Central serous chorioretinopathy, right eye: Secondary | ICD-10-CM | POA: Diagnosis not present

## 2024-02-28 DIAGNOSIS — H43811 Vitreous degeneration, right eye: Secondary | ICD-10-CM

## 2024-02-28 DIAGNOSIS — I10 Essential (primary) hypertension: Secondary | ICD-10-CM

## 2024-02-28 DIAGNOSIS — H35033 Hypertensive retinopathy, bilateral: Secondary | ICD-10-CM

## 2024-02-28 MED ORDER — AFLIBERCEPT 8 MG/0.07ML IZ SOLN
8.0000 mg | INTRAVITREAL | Status: AC | PRN
Start: 2024-02-28 — End: 2024-02-28
  Administered 2024-02-28: 8 mg via INTRAVITREAL

## 2024-03-12 ENCOUNTER — Ambulatory Visit: Admitting: Urology

## 2024-03-21 NOTE — Progress Notes (Signed)
 [facility] - Clinic Note  03/27/2024     CHIEF COMPLAINT Patient presents for Retina Follow Up  HISTORY OF PRESENT ILLNESS: Darlene Crawford is a 83 y.o. 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  This started 4 weeks ago.  I, the attending physician,  performed the HPI with the patient and updated documentation appropriately.        Comments   Patient here for 4 weeks retina follow up for exu ARMD OD. Patient states vision can't tell much difference. No eye pain. Uses AT every day OU.      Last edited by Valdemar Rogue, MD on 03/27/2024  4:59 PM.      Referring physician: Hairfield, Keavie C [address] [place],  [place] [address]  HISTORICAL INFORMATION:  Selected notes from the MEDICAL RECORD NUMBER Referred by Dr. FABIENE Moats for concern of ARMD OD with progression to CNVM vs CSR   CURRENT MEDICATIONS: Current Outpatient Medications (Ophthalmic Drugs)  Medication Sig   ARTIFICIAL TEAR SOLUTION OP Place 1 drop into both eyes daily as needed (dry eyes).   Current Facility-Administered Medications (Ophthalmic Drugs)  Medication Route   aflibercept  (EYLEA ) SOLN 2 mg Intravitreal   aflibercept  (EYLEA ) SOLN 2 mg Intravitreal   aflibercept  (EYLEA ) SOLN 2 mg Intravitreal   Current Outpatient Medications (Other)  Medication Sig   atorvastatin (LIPITOR) 10 MG tablet Take 10 mg by mouth at bedtime.    Cholecalciferol (VITAMIN D) 50 MCG (2000 UT) tablet Take 4,000 Units by mouth at bedtime.    Cyanocobalamin (B-12 PO) Take 1 Dose by mouth 2 (two) times a week.   estradiol  (ESTRACE ) 0.1 MG/GM vaginal cream Discard plastic applicator. Insert a blueberry size amount (approximately 1 gram) of cream on fingertip inside vagina at bedtime every night for 1 week then every other night. For long term use.   levothyroxine (SYNTHROID, LEVOTHROID) 100 MCG tablet Take 100 mcg by mouth daily  before breakfast.   losartan (COZAAR) 100 MG tablet Take 100 mg by mouth daily.    Melatonin 10 MG CAPS Take 10 mg by mouth at bedtime.   Multiple Vitamins-Minerals (PRESERVISION AREDS 2 PO) Take 1 tablet by mouth at bedtime.    acidophilus (RISAQUAD) CAPS capsule Take 1 capsule by mouth daily. (Patient not taking: Reported on 03/27/2024)   fluticasone (FLONASE) 50 MCG/ACT nasal spray Place 1 spray into both nostrils daily as needed for allergies or rhinitis. (Patient not taking: Reported on 03/27/2024)   hydrochlorothiazide (HYDRODIURIL) 25 MG tablet Take 25 mg by mouth daily. 1/2 tablet am, 1/2 tablet pm (Patient not taking: Reported on 03/27/2024)   loratadine (CLARITIN) 10 MG tablet Take 10 mg by mouth daily as needed for allergies. (Patient not taking: Reported on 03/27/2024)   sertraline (ZOLOFT) 50 MG tablet Take 50 mg by mouth daily. (Patient not taking: Reported on 03/27/2024)   sodium chloride  (OCEAN) 0.65 % SOLN nasal spray Place 1 spray into both nostrils as needed for congestion. (Patient not taking: Reported on 03/27/2024)   Current Facility-Administered Medications (Other)  Medication Route   Bevacizumab  (AVASTIN ) SOLN 1.25 mg Intravitreal   Bevacizumab  (AVASTIN ) SOLN 1.25 mg Intravitreal   Bevacizumab  (AVASTIN ) SOLN 1.25 mg Intravitreal   REVIEW OF SYSTEMS: ROS   Positive for: Genitourinary, Musculoskeletal, Endocrine, Eyes, Psychiatric, Allergic/Imm Negative for: Constitutional, Gastrointestinal, Neurological, Skin, HENT, Cardiovascular, Respiratory,  Heme/Lymph Last edited by Orval Asberry RAMAN, COA on 03/27/2024  1:19 PM.      ALLERGIES Allergies  Allergen Reactions   Trilyte [Peg 3350 -Kcl-Na Bicarb-Nacl]     HEMATEMESIS   Vicodin [Hydrocodone-Acetaminophen] Nausea And Vomiting   PAST MEDICAL HISTORY Past Medical History:  Diagnosis Date   Anxiety and depression    Cataract    Hypercholesterolemia    Hypertension    Hypertensive retinopathy    OU   Hypothyroidism     Macular degeneration    OD   Osteoarthritis    PONV (postoperative nausea and vomiting)    Seasonal allergies    Past Surgical History:  Procedure Laterality Date   BIOPSY  10/01/2018   Procedure: BIOPSY;  Surgeon: Harvey Margo CROME, MD;  Location: [facility];  Service: Endoscopy;;  gastric bx & gastric polyp   CATARACT EXTRACTION W/PHACO Right 12/23/2022   Procedure: CATARACT EXTRACTION PHACO AND INTRAOCULAR LENS PLACEMENT (IOC);  Surgeon: Juli Blunt, MD;  Location: [facility];  Service: Ophthalmology;  Laterality: Right;  CDE: 11.88   CATARACT EXTRACTION W/PHACO Left 02/24/2023   Procedure: CATARACT EXTRACTION PHACO AND INTRAOCULAR LENS PLACEMENT (IOC);  Surgeon: Juli Blunt, MD;  Location: [facility];  Service: Ophthalmology;  Laterality: Left;  CDE  5.82   COLONOSCOPY N/A 10/01/2018   Procedure: COLONOSCOPY;  Surgeon: Harvey Margo CROME, MD;  Location: [facility];  Service: Endoscopy;  Laterality: N/A;  10:30am   ESOPHAGOGASTRODUODENOSCOPY N/A 10/01/2018   Procedure: ESOPHAGOGASTRODUODENOSCOPY (EGD);  Surgeon: Harvey Margo CROME, MD;  Location: [facility];  Service: Endoscopy;  Laterality: N/A;   POLYPECTOMY  10/01/2018   Procedure: POLYPECTOMY;  Surgeon: Harvey Margo CROME, MD;  Location: [facility];  Service: Endoscopy;;  colon    TUBAL LIGATION     UTERINE FIBROID SURGERY     FAMILY HISTORY Family History  Problem Relation Age of Onset   Cancer Mother        lung   Stroke Father    Stroke Brother    Colon cancer Neg Hx    SOCIAL HISTORY Social History   Tobacco Use   Smoking status: Former    Current packs/day: 0.00    Average packs/day: 0.3 packs/day for 4.0 years (1.0 ttl pk-yrs)    Types: Cigarettes    Start date: 11/04/1960    Quit date: 11/04/1964    Years since quitting: 59.4   Smokeless tobacco: Never  Vaping Use   Vaping status: Never Used  Substance Use Topics   Alcohol  use: No   Drug use: No       OPHTHALMIC EXAM:  Base Eye Exam     Visual Acuity  (Snellen - Linear)       Right Left   Dist [place] 20/40 20/25   Dist ph [place] NI 20/20 -2         Tonometry (Tonopen, 1:17 PM)       Right Left   Pressure 12 13         Pupils       Dark Light Shape React APD   Right 3 2 Round Brisk None   Left 3 2 Round Brisk None         Visual Fields (Counting fingers)       Left Right    Full Full         Extraocular Movement       Right Left    Full, Ortho Full, Ortho  Neuro/Psych     Oriented x3: Yes   Mood/Affect: Normal         Dilation     Right eye: 1.0% Mydriacyl , 2.5% Phenylephrine  @ 1:15 PM  Patient wanted only OD dilated.          Slit Lamp and Fundus Exam     Slit Lamp Exam       Right Left   Lids/Lashes Dermatochalasis - upper lid Dermatochalasis - upper lid   Conjunctiva/Sclera White and quiet White and quiet   Cornea Arcus, well healed cataract wound Arcus, early nasal band K, 1+PEE   Anterior Chamber Deep, 1-2+ fine cell / pigment moderate depth, with narrow angle temporally   Iris Round and well dilated Round and well dilated   Lens PC IOL in good position PC IOL in good postition   Anterior Vitreous Vitreous syneresis, Posterior vitreous detachment, Weiss ring Vitreous syneresis         Fundus Exam       Right Left   Disc Pink and Sharp, Compact Pink and Sharp, Tilted disc   C/D Ratio 0.2 0.3   Macula Flat, blunted foveal reflex; central SRF -- improved, +drusen; +RPE mottling and clumping, small cluster of drusen and RPE mottling inferior fovea, no heme Flat, good foveal reflex, Retinal pigment epithelial mottling, superior para-foveal focal area of RPE atrophy -- stable, rare drusen, No heme or edema   Vessels attenuated, Tortuous Vascular attenuation, Tortuous   Periphery Attached, peripheral drusen / RPE changes nasally, peripheral cystoid degeneration Attached, very mild lattice at 0430, mild patch of Lattice degeneration at 0600 -- stable, scattered peripheral drusen,  peripheral cystoid degeneration           IMAGING AND PROCEDURES  Imaging and Procedures for 12/11/17  OCT, Retina - OU - Both Eyes       Right Eye Quality was good. Central Foveal Thickness: 318. Progression has improved. Findings include no IRF, abnormal foveal contour, retinal drusen , pigment epithelial detachment, subretinal fluid, outer retinal atrophy (Interval improvement in central and inferior SRF and SRHM -- just trace SRF remains).   Left Eye Quality was good. Central Foveal Thickness: 272. Progression has been stable. Findings include normal foveal contour, no IRF, no SRF, retinal drusen .   Notes *Images captured and stored on drive  Diagnosis / Impression:  OD: Interval improvement in central and inferior SRF and SRHM -- just trace SRF remains OS: NFP, No IRF/SRF  Clinical management:  See below  Abbreviations: NFP - Normal foveal profile. CME - cystoid macular edema. PED - pigment epithelial detachment. IRF - intraretinal fluid. SRF - subretinal fluid. EZ - ellipsoid zone. ERM - epiretinal membrane. ORA - outer retinal atrophy. ORT - outer retinal tubulation. SRHM - subretinal hyper-reflective material       Intravitreal Injection, Pharmacologic Agent - OD - Right Eye       Time Out 03/27/2024. 2:28 PM. Confirmed correct patient, procedure, site, and patient consented.   Anesthesia Topical anesthesia was used. Anesthetic medications included Lidocaine  2%, Proparacaine 0.5%.   Procedure Preparation included 5% betadine  to ocular surface, eyelid speculum. A (30g) needle was used.   Injection: 8 mg aflibercept  8 MG/0.07ML   Route: Intravitreal, Site: Right Eye   NDC: [id number], Lot: [id number], Expiration date: 11/26/2024, Waste: 0 mL   Post-op Post injection exam found visual acuity of at least counting fingers. The patient tolerated the procedure well. There were no complications. The patient received written and verbal post procedure  care  education. Post injection medications were not given.            ASSESSMENT/PLAN:    ICD-10-CM   1. Exudative age-related macular degeneration of right eye with active choroidal neovascularization (HCC)  H35.3211 OCT, Retina - OU - Both Eyes    Intravitreal Injection, Pharmacologic Agent - OD - Right Eye    aflibercept  (EYLEA  HD) ophthalmic injection 8 mg    2. 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 Essential hypertension  I10     4. Hypertensive retinopathy of both eyes  H35.033     5. 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 Posterior vitreous detachment of right eye  H43.811     7. Pseudophakia  Z96.1     8. Combined forms of age-related cataract of left eye  H25.812      1,2. Exudative age related macular degeneration vs CSCR OD  - FA (03.02.19) without leakage / pooling into area of SRF - review of systems reveals significant stressors in life, but no exogenous steroid use - S/P IVA #1 OD (09.27.19),  #2 (10.25.19), #3 (11.22.19) -- minimal response -- IVA resistance  =============================== - S/P IVE OD #1 (12.20.19), #2 (01.20.20), #3 (02.18.20), #4 (07.20.20) #5 (09.01.20), #6 (10.13.20), #7 (11.20.20), #8 (01.20.21), #9 (02.17.21), #10 (03.19.21), #11 (04.20.21), #12 (05.25.21), #13 (6.23.21), #14 (07.27.21), #15 (08.30.21), #16 (9.28.21), #17 (10.26.21), #18 (11.29.21), #19 (01.11.22), #20 (02.08.22), #21 (3.8.22), #22 (04.05.22), #23 (5.10.22), #24 (06.21.22), #25 (8.2.22), #26 (9.20.22), #27 (11.15.22), #28 (01.17.23), #29 (03.29.23), #30 (06.21.23), #31 (09.27.23), #32 (01.16.24), #33 (05.07.24), #34 (06.18.24), #35 (07.30.24) -- IVE resistance ================================== - s/p IVV OD #1 (09.03.24), #2 (10.03.24), #3 (11.06.24), #4 (12.04.24) #5(01.02.25) #6 (01.31.25 -- sample), #7 (sample -- 02.28.25), #8 (04.02.25), #9 (05.02.25) -- IVV resistance ================================== - s/p IVE HD #1 (06.04.25), #2  (07.02.25)  - lost to f/u from Feb 2020 to July 2020 due to COVID-19 concerns - today, BCVA OD stable at 20/40 - OCT shows interval improvement in central and inferior SRF--just trace SRF remains at 4 wks -- good response to IVE HD  - recommend IVE HD OD #3 today, 07.30.25 -- with follow up in 8 wks  - pt wishes to proceed with IVE HD OD   - RBA of procedure discussed, questions answered  - informed consent obtained   - IVE HD informed consent form signed and scanned on 6.04.25  - see procedure note  - Eylea4U benefits investigation initiated -- approved for 2025  - Vabysmo  approved for 2025   - [employer] funding fully reinstated as of 04.02.25  - EyleaHD approved for 2025  - f/u 8 weeks -- DFE/OCT, possible injection  3,4. Hypertensive retinopathy OU  - discussed importance of tight BP control  - stable  - monitor  5. Lattice degeneration OS  - patch of inferior lattice OS -- no RT, holes or SRF  - asymptomatic   - discussed findings, prognosis, and treatment options including observation  - monitor  6. PVD / vitreous syneresis OD-   - Discussed findings and prognosis  - No RT or RD on 360 peripheral exam  - Reviewed s/s of RT/RD  - strict return precautions for any such RT/RD symptoms  7. Pseudophakia OU  - s/p CE/IOL OD (Dr. Juli, 04.26.24, OS 02/24/23)  - IOL in good position, doing well  - monitor  Ophthalmic Meds Ordered this visit:  Meds ordered this  encounter  Medications   aflibercept  (EYLEA  HD) ophthalmic injection 8 mg     Return in about 8 weeks (around 05/22/2024) for f/u exu ARMD OD, DFE, OCT, Possible Injxn.  There are no Patient Instructions on file for this visit.   This document serves as a record of services personally performed by Redell JUDITHANN Hans, MD, PhD. It was created on their behalf by Almetta Pesa, an ophthalmic technician. The creation of this record is the provider's dictation and/or activities during the visit.    Electronically signed  by: Almetta Pesa, OA, 03/27/24  4:59 PM  This document serves as a record of services personally performed by Redell JUDITHANN Hans, MD, PhD. It was created on their behalf by Alan PARAS. Delores, OA an ophthalmic technician. The creation of this record is the provider's dictation and/or activities during the visit.    Electronically signed by: Alan PARAS. Delores, OA 03/27/24 4:59 PM  Redell JUDITHANN Hans, M.D., Ph.D. Diseases & Surgery of the Retina and Vitreous [facility]  I have reviewed the above documentation for accuracy and completeness, and I agree with the above. Redell JUDITHANN Hans, M.D., Ph.D. 03/27/24 5:00 PM   Abbreviations: M myopia (nearsighted); A astigmatism; H hyperopia (farsighted); P presbyopia; Mrx spectacle prescription;  CTL contact lenses; OD right eye; OS left eye; OU both eyes  XT exotropia; ET esotropia; PEK punctate epithelial keratitis; PEE punctate epithelial erosions; DES dry eye syndrome; MGD meibomian gland dysfunction; ATs artificial tears; PFAT's preservative free artificial tears; NSC nuclear sclerotic cataract; PSC posterior subcapsular cataract; ERM epi-retinal membrane; PVD posterior vitreous detachment; RD retinal detachment; DM diabetes mellitus; DR diabetic retinopathy; NPDR non-proliferative diabetic retinopathy; PDR proliferative diabetic retinopathy; CSME clinically significant macular edema; DME diabetic macular edema; dbh dot blot hemorrhages; CWS cotton wool spot; POAG primary open angle glaucoma; C/D cup-to-disc ratio; HVF humphrey visual field; GVF goldmann visual field; OCT optical coherence tomography; IOP intraocular pressure; BRVO Branch retinal vein occlusion; CRVO central retinal vein occlusion; CRAO central retinal artery occlusion; BRAO branch retinal artery occlusion; RT retinal tear; SB scleral buckle; PPV pars plana vitrectomy; VH Vitreous hemorrhage; PRP panretinal laser photocoagulation; IVK intravitreal kenalog; VMT vitreomacular  traction; MH Macular hole;  NVD neovascularization of the disc; NVE neovascularization elsewhere; AREDS age related eye disease study; ARMD age related macular degeneration; POAG primary open angle glaucoma; EBMD epithelial/anterior basement membrane dystrophy; ACIOL anterior chamber intraocular lens; IOL intraocular lens; PCIOL posterior chamber intraocular lens; Phaco/IOL phacoemulsification with intraocular lens placement; PRK photorefractive keratectomy; LASIK laser assisted in situ keratomileusis; HTN hypertension; DM diabetes mellitus; COPD chronic obstructive pulmonary disease

## 2024-03-27 ENCOUNTER — Ambulatory Visit (INDEPENDENT_AMBULATORY_CARE_PROVIDER_SITE_OTHER): Admitting: Ophthalmology

## 2024-03-27 ENCOUNTER — Encounter (INDEPENDENT_AMBULATORY_CARE_PROVIDER_SITE_OTHER): Payer: Self-pay | Admitting: Ophthalmology

## 2024-03-27 DIAGNOSIS — H25812 Combined forms of age-related cataract, left eye: Secondary | ICD-10-CM

## 2024-03-27 DIAGNOSIS — H35412 Lattice degeneration of retina, left eye: Secondary | ICD-10-CM

## 2024-03-27 DIAGNOSIS — I10 Essential (primary) hypertension: Secondary | ICD-10-CM | POA: Diagnosis not present

## 2024-03-27 DIAGNOSIS — H43811 Vitreous degeneration, right eye: Secondary | ICD-10-CM

## 2024-03-27 DIAGNOSIS — H35711 Central serous chorioretinopathy, right eye: Secondary | ICD-10-CM

## 2024-03-27 DIAGNOSIS — H35033 Hypertensive retinopathy, bilateral: Secondary | ICD-10-CM

## 2024-03-27 DIAGNOSIS — Z961 Presence of intraocular lens: Secondary | ICD-10-CM

## 2024-03-27 DIAGNOSIS — H353211 Exudative age-related macular degeneration, right eye, with active choroidal neovascularization: Secondary | ICD-10-CM | POA: Diagnosis not present

## 2024-03-27 MED ORDER — AFLIBERCEPT 8 MG/0.07ML IZ SOLN
8.0000 mg | INTRAVITREAL | Status: AC | PRN
Start: 2024-03-27 — End: 2024-03-27
  Administered 2024-03-27: 8 mg via INTRAVITREAL

## 2024-03-28 ENCOUNTER — Ambulatory Visit: Admitting: Urology

## 2024-03-28 ENCOUNTER — Encounter: Payer: Self-pay | Admitting: Urology

## 2024-03-28 VITALS — BP 153/79 | HR 69

## 2024-03-28 DIAGNOSIS — R399 Unspecified symptoms and signs involving the genitourinary system: Secondary | ICD-10-CM | POA: Diagnosis not present

## 2024-03-28 DIAGNOSIS — R351 Nocturia: Secondary | ICD-10-CM

## 2024-03-28 NOTE — Progress Notes (Signed)
 Name: Darlene Crawford DOB: May 12, 1941 MRN: [id number]  History of Present Illness: Ms. Cavey is a 83 y.o. female who presents today for follow up visit at [facility] [place].  Urine culture results in past 12 months: - 10/12/2023: Positive for Staphylococcus epidermidis - No additional urine culture results in past 12 months found per chart review.  At 1st visit on 01/29/2024: - Reported history of UTls; insufficient urine culture data to formally validate recurrent UTI diagnosis. Also has rare urge incontinence, mainly in the morning or if she delays voiding. - For UTI prevention the plan was: 1. Start topical vaginal estrogen cream as prescribed. 2. Maintain adequate fluid intake daily to flush out the urinary tract. 3. Consider OTC supplements for UTI prevention.  Today: She reports 0 UTIs since last visit. She has been using vaginal estrogen cream at a frequency of every other night. She denies increased urinary urgency, frequency, dysuria, gross hematuria, straining to void, or sensations of incomplete emptying. Reports chronic nocturia x1-2 per night which she states is not significantly bothersome.   Medications: Current Outpatient Medications  Medication Sig Dispense Refill   acidophilus (RISAQUAD) CAPS capsule Take 1 capsule by mouth daily.     ARTIFICIAL TEAR SOLUTION OP Place 1 drop into both eyes daily as needed (dry eyes).     atorvastatin (LIPITOR) 10 MG tablet Take 10 mg by mouth at bedtime.      Cholecalciferol (VITAMIN D) 50 MCG (2000 UT) tablet Take 4,000 Units by mouth at bedtime.      Cyanocobalamin (B-12 PO) Take 1 Dose by mouth 2 (two) times a week.     estradiol  (ESTRACE ) 0.1 MG/GM vaginal cream Discard plastic applicator. Insert a blueberry size amount (approximately 1 gram) of cream on fingertip inside vagina at bedtime every night for 1 week then every other night. For long term use. 30 g 3   fluticasone (FLONASE) 50 MCG/ACT nasal spray Place 1  spray into both nostrils daily as needed for allergies or rhinitis.     hydrochlorothiazide (HYDRODIURIL) 25 MG tablet Take 25 mg by mouth daily. 1/2 tablet am, 1/2 tablet pm     levothyroxine (SYNTHROID, LEVOTHROID) 100 MCG tablet Take 100 mcg by mouth daily before breakfast.     loratadine (CLARITIN) 10 MG tablet Take 10 mg by mouth daily as needed for allergies.     losartan (COZAAR) 100 MG tablet Take 100 mg by mouth daily.      Multiple Vitamins-Minerals (PRESERVISION AREDS 2 PO) Take 1 tablet by mouth at bedtime.      sodium chloride  (OCEAN) 0.65 % SOLN nasal spray Place 1 spray into both nostrils as needed for congestion.     Melatonin 10 MG CAPS Take 10 mg by mouth at bedtime. (Patient not taking: Reported on 03/28/2024)     sertraline (ZOLOFT) 50 MG tablet Take 50 mg by mouth daily. (Patient not taking: Reported on 03/28/2024)     Current Facility-Administered Medications  Medication Dose Route Frequency Provider Last Rate Last Admin   aflibercept  (EYLEA ) SOLN 2 mg  2 mg Intravitreal  Zamora, Brian, MD   2 mg at 08/19/18 0126   aflibercept  (EYLEA ) SOLN 2 mg  2 mg Intravitreal  Zamora, Brian, MD   2 mg at 09/19/18 9176   aflibercept  (EYLEA ) SOLN 2 mg  2 mg Intravitreal  Zamora, Brian, MD   2 mg at 10/16/18 1719   Bevacizumab  (AVASTIN ) SOLN 1.25 mg  1.25 mg Intravitreal  Zamora, Brian, MD  1.25 mg at 05/28/18 9162   Bevacizumab  (AVASTIN ) SOLN 1.25 mg  1.25 mg Intravitreal  Zamora, Brian, MD   1.25 mg at 06/24/18 2336   Bevacizumab  (AVASTIN ) SOLN 1.25 mg  1.25 mg Intravitreal  Zamora, Brian, MD   1.25 mg at 07/20/18 1324    Allergies: Allergies  Allergen Reactions   Trilyte [Peg 3350 -Kcl-Na Bicarb-Nacl]     HEMATEMESIS   Vicodin [Hydrocodone-Acetaminophen] Nausea And Vomiting    Past Medical History:  Diagnosis Date   Anxiety and depression    Cataract    Hypercholesterolemia    Hypertension    Hypertensive retinopathy    OU   Hypothyroidism    Macular degeneration    OD    Osteoarthritis    PONV (postoperative nausea and vomiting)    Seasonal allergies    Past Surgical History:  Procedure Laterality Date   BIOPSY  10/01/2018   Procedure: BIOPSY;  Surgeon: Harvey Margo CROME, MD;  Location: [facility];  Service: Endoscopy;;  gastric bx & gastric polyp   CATARACT EXTRACTION W/PHACO Right 12/23/2022   Procedure: CATARACT EXTRACTION PHACO AND INTRAOCULAR LENS PLACEMENT (IOC);  Surgeon: Juli Blunt, MD;  Location: [facility];  Service: Ophthalmology;  Laterality: Right;  CDE: 11.88   CATARACT EXTRACTION W/PHACO Left 02/24/2023   Procedure: CATARACT EXTRACTION PHACO AND INTRAOCULAR LENS PLACEMENT (IOC);  Surgeon: Juli Blunt, MD;  Location: [facility];  Service: Ophthalmology;  Laterality: Left;  CDE  5.82   COLONOSCOPY N/A 10/01/2018   Procedure: COLONOSCOPY;  Surgeon: Harvey Margo CROME, MD;  Location: [facility];  Service: Endoscopy;  Laterality: N/A;  10:30am   ESOPHAGOGASTRODUODENOSCOPY N/A 10/01/2018   Procedure: ESOPHAGOGASTRODUODENOSCOPY (EGD);  Surgeon: Harvey Margo CROME, MD;  Location: [facility];  Service: Endoscopy;  Laterality: N/A;   POLYPECTOMY  10/01/2018   Procedure: POLYPECTOMY;  Surgeon: Harvey Margo CROME, MD;  Location: [facility];  Service: Endoscopy;;  colon    TUBAL LIGATION     UTERINE FIBROID SURGERY     Family History  Problem Relation Age of Onset   Cancer Mother        lung   Stroke Father    Stroke Brother    Colon cancer Neg Hx    Social History   Socioeconomic History   Marital status: Married    Spouse name: Not on file   Number of children: Not on file   Years of education: Not on file   Highest education level: Not on file  Occupational History   Not on file  Tobacco Use   Smoking status: Former    Current packs/day: 0.00    Average packs/day: 0.3 packs/day for 4.0 years (1.0 ttl pk-yrs)    Types: Cigarettes    Start date: 11/04/1960    Quit date: 11/04/1964    Years since quitting: 59.4   Smokeless tobacco: Never   Vaping Use   Vaping status: Never Used  Substance and Sexual Activity   Alcohol  use: No   Drug use: No   Sexual activity: Not Currently  Other Topics Concern   Not on file  Social History Narrative   Not on file   Social Drivers of Health   Financial Resource Strain: Not on file  Food Insecurity: No Food Insecurity (05/11/2022)   Hunger Vital Sign    Worried About Running Out of Food in the Last Year: Never true    Ran Out of Food in the Last Year: Never true  Transportation Needs: No  Transportation Needs (05/11/2022)   PRAPARE - Administrator, Civil Service (Medical): No    Lack of Transportation (Non-Medical): No  Physical Activity: Not on file  Stress: Not on file  Social Connections: Not on file  Intimate Partner Violence: Not on file    Review of Systems Constitutional: Patient denies any unintentional weight loss or change in strength lntegumentary: Patient denies any rashes or pruritus Cardiovascular: Patient denies chest pain or syncope Respiratory: Patient denies shortness of breath Gastrointestinal: Patient denies nausea, vomiting, constipation, or diarrhea  Musculoskeletal: Patient denies muscle cramps or weakness Neurologic: Patient denies convulsions or seizures Allergic/Immunologic: Patient denies recent allergic reaction(s) Hematologic/Lymphatic: Patient denies bleeding tendencies Endocrine: Patient denies heat/cold intolerance  GU: As per HPI.  OBJECTIVE Vitals:   03/28/24 1532  BP: (!) 153/79  Pulse: 69   There is no height or weight on file to calculate BMI.  Physical Examination Constitutional: No obvious distress; patient is non-toxic appearing  Cardiovascular: No visible lower extremity edema.  Respiratory: The patient does not have audible wheezing/stridor; respirations do not appear labored  Gastrointestinal: Abdomen non-distended Musculoskeletal: Normal ROM of UEs  Skin: No obvious rashes/open sores  Neurologic: CN 2-12  grossly intact Psychiatric: Answered questions appropriately with normal affect  Hematologic/Lymphatic/Immunologic: No obvious bruises or sites of spontaneous bleeding  Urine microscopy: unremarkable  ASSESSMENT Lower urinary tract symptoms (LUTS)  She is doing well. We agreed to continue current treatment regimen and plan for follow up in 1 year or sooner if needed. Patient verbalized understanding of and agreement with current plan. All questions were answered.  PLAN Advised the following: 1. Continue topical vaginal estrogen cream use. 2. Return in about 1 year (around 03/28/2025) for Recurrent UTI, with UA & PVR.  No orders of the defined types were placed in this encounter.   It has been explained that the patient is to follow regularly with their PCP in addition to all other providers involved in their care and to follow instructions provided by these respective offices. Patient advised to contact urology clinic if any urologic-pertaining questions, concerns, new symptoms or problems arise in the interim period.  Patient Instructions  UTI prevention / management:  UTI symptoms may include:  - Pain / burning / discomfort when urinating - Recent increase in urinary urgency (how quickly you feel like you need to rush to the bathroom) - Recent increase in urinary frequency (how often you are urinating) - Fever - Acute mental status change / confusion - Fatigue / Feeling tired - Weakness - Note: Urine color, clarity, and odor are not considered to be clinically significant indicators of UTI and do not warrant urine testing unless patient is also experiencing UTI symptoms such as those listed above.  Difference between Urinalysis (urine dipstick test) and Urine culture / Why urine culture often needed to determine appropriate diagnosis and treatment of urologic symptoms: > Urinalysis (urine dipstick test): A quick office test used as an indicator to determine whether or not further  testing is necessary (such as a urine culture, urine microscopy, etc.) The urinalysis cannot differentiate a true bacterial UTI or give a definitive diagnosis for the findings.  > Urine culture: May be performed based on the findings of a urinalysis to evaluate for UTI. Grows out on a petri dish for 48-72 hours. Provides important information about: whether or not bacterial growth is present and if so: what the predominant bacteria is which antibiotics will work best against that bacteria That information is important  so that we can diagnose and treat patients appropriately as there are other conditions which may mimic UTls which must not be missed (such as cancer, interstitial cystitis, stones, etc.). Assists us  with antibiotic stewardship to minimize patient's risk for developing antibiotic resistance (getting to a point where no antibiotics work anymore).  Options when UTI symptoms occur: 1. Call [facility]  and request to speak with triage nurse (phone # [phone], select option 3). In accordance with clinic guidelines the nurse will determine next steps based on patient-reported symptoms, which may include: same-day lab visit to provide urine specimen, recommendation to schedule Urology office visit appointment for further evaluation, recommendation to proceed to ER, etc. 2. Call your Primary Care Provider (PCP) office to request urgent / same-day visit. Be sure to request for urine culture to be ordered and have results faxed to Urology (fax # [phone]).  3. Go to urgent care. Be sure to request for urine culture to be ordered and have results faxed to Urology (fax # [phone]).   For bladder pain/ burning with urination: - Can take over-the-counter Pyridium (phenazopyridine; commonly known under the AZO brand) for a few days as needed. Limit use to no more than 3 days consecutively due to risk for methemoglobinemia, liver function issues, and bone health  damage with long term use of Pyridium. - Alternative: Prescription urinary analgesics (such as Uribel, Urogesic blue, Urelle, Uro-MP). Often expensive / poorly covered by insurance unfortunately.  Options / recommendations for UTI prevention: - Topical vaginal estrogen for vaginal atrophy (aka Genitourinary Syndrome of Menopause (GSM)). - Adequate daily fluid intake to flush out the urinary tract. - Go to the bathroom to urinate every 4-6 hours while awake to minimize urinary stasis / bacterial overgrowth in the bladder. - Proanthocyanidin (PAC) supplement 36 mg daily; must be soluble (insoluble form of PAC will be ineffective). Recommended brand: Ellura. This is an over-the-counter supplement (often must be found/ purchased online) supplement derived from cranberries with concentrated active component: Proanthocyanidin (PAC) 36 mg daily. Decreases bacterial adherence to bladder lining.  - D-mannose powder (2 grams daily). This is an over-the-counter supplement which decreases bacterial adherence to bladder lining (it is a sugar that inhibits bacterial adherence to urothelial cells by binding to the pili of enteric bacteria). Take as per manufacturer recommendation. Can be used as an alternative or in addition to the concentrated cranberry supplement.  - Vitamin C supplement to acidify urine to minimize bacterial growth.  - Probiotic to maintain healthy vaginal microbiome to suppress bacteria at urethral opening. Brand recommendations: Verlon (includes probiotic & D-mannose ), Feminine Balance (highest concentration of lactobacillus) or Hyperbiotic Pro 15.  Note for patients with diabetes:  - Be aware that D-mannose contains sugar.  Note for patients with interstitial cystitis (IC):  - Patients with IC should typically avoid cranberry/ PAC supplements and Vitamin C supplements due to their acidity, which may exacerbate IC-related bladder pain. - Symptoms of true bacterial UTI can overlap / mimic  symptoms of an IC flare up. Antibiotic use is NOT indicated for IC flare ups. Urine culture needed prior to antibiotic treatment for IC patients. The goal is to minimize your risk for developing antibiotic-resistant bacteria.    Vaginal atrophy I Genitourinary syndrome of menopause (GSM):  What it is: Changes in the vaginal environment (including the vulva and urethra) including: Thinning of the epithelium (skin/ mucosa surface) Can contribute to urinary urgency and frequency Can contribute to dryness, itching, irritation of the vulvar and vaginal tissue  Can contribute to pain with intercourse Can contribute to physical changes of the labia, vulva, and vagina such as: Narrowing of the vaginal opening Decreased vaginal length Loss of labial architecture Labial adhesions Pale color of vulvovaginal tissue Loss of pubic hair Allows bacteria to become adherent Results in increased risk for urinary tract infection (UTI) due to bacterial overgrowth and migration up the urethra into the bladder Change in vaginal pH (acid/ base balance) Allows for alteration / disruption of the normal bacterial flora / microbiome Results in increased risk for urinary tract infection (UTI) due to bacterial overgrowth  Treatment options: Over-the-counter lubricants (see list below). Prescription vaginal estrogen replacement. Options: Topical vaginal estrogen (estradiol ) cream: (Estrace , Premarin, compounded) Apply as directed Estring  vaginal ring Exchanged every 3 months (either at home or in office by provider) Vagifem  vaginal tablet Inserted nightly for 2 weeks then twice a week (long term) lntrarosa vaginal suppository Vaginal DHEA: converts to estrogen in vaginal tissue without systemic effect Inserted nightly (long term) 3. Vaginal laser therapy (Mona Olam touch) Performed in 3 treatments each 6 weeks apart (available in our [place] office). Can feel like a sunburn for 3-4 days after each treatment  until new skin heals in. Usually not covered by insurance. Estimated cost is $1500 for all 3 sessions.  FYI regarding prescription vaginal estrogen treatment options: - All topical vaginal estrogen replacement options are equivalent in terms of efficacy. - Topical vaginal estrogen replacement will take about 3 months to be effective. - OK to have sex with any of the topical vaginal estrogen replacement options. - Topical vaginal estrogen replacement may sting/burn initially due to severe dryness, which will improve with ongoing treatment. - Studies have demonstrated negligible systemic absorption of low-dose intravaginal estrogen cream therefore the risk for cancer development or recurrence with this medication is minimal.  Genitourinary Syndrome of Menopause: AUA/SUFU/AUGS Guideline (2025) [place]  Topical vaginal estrogen cream safe to use with breast cancer history [email or web address]  Topical vaginal estrogen cream safe to use with blood clot history [email or web address]   Lubricants and Moisturizers for Treating Genitourinary Syndrome of Menopause and Vulvovaginal Atrophy Treatment Comments I Available Products   Lubricants   Water -based Ingredients: Deionized water , glycerin, propylene glycol; latex safe; rare irritation; dry out with extended sexual activity Astroglide, Good Clean Love, K-Y Jelly, Natural, Organic, Pink, Sliquid, Sylk, Yes    Oil Based Ingredients: avocado, olive, peanut, corn; latex safe; can be used with silicone products; staining; safe (unless peanut allergy); non-irritating Coconut oil, vegetable oil, vitamin E oil  Silicone-Based  Ingredients: Silicone polymers; staining; typically nonirritating, long lasting; waterproof; should not be used with silicone dilators, sexual toys, or gynecologic products Astroglide X, Oceanus Ultra Pure, Pink Silicone, Pjur Eros, Replens Silky Smooth, Silicone Premium JO, SKYN, Uberlube, [employer] Based Minimize harm to sperm motility; designed for couples trying to conceive:  Astroglide TTC, Conceive Plus, PreSeed, Yes Baby  Fertility Friendly Minimize harm to sperm motility; designed for couples trying to conceive:  Astroglide, TTC, Conceive Plus, PreSeed, Yes Baby  Vaginal Moisturizers   Vaginal Moisturizers For maintenance use 1 to 3 times weekly; can benefit women with dryness, chafing with AOL, and recurrent vaginal infections irrespective of sexual activity timing Balance Active Menopause Vaginal Moisturizing Lubricant, Canesintima Intimate Moisturizer, Replens, Rephresh, Sylk Natural Intimate Moisturizer, Yes Vaginal Moisturizer  Hybrids Properties of both water  and silicone-based products (combination of a vaginal lubricant and moisturizer); Non-irritating; good option for women with allergies and sensitivities Lubrigyn, Luvena  Suppositories Hyaluronic acid to  retain moisture Revaree  Vulvar Soothing Creams/Oils    Medicated Creams Pain and burn relief; Ingredients: 4% Lidocaine , Aloe Vera gel Releveum ([place])  Non-Medicated Creams For anti-itch and moisture/maintenance; Ingredients: Coconut oil, Avocado oil, Shea Butter, Olive oil, Vitamin E Vajuvenate, Vmagic  Oils for moisture/maintenance: Coconut oil, Vitamin E oil, Emu oil     Electronically signed by:  Lauraine JAYSON Oz, FNP   03/28/24    4:05 PM

## 2024-03-28 NOTE — Addendum Note (Signed)
 Addended by: ANN VELERIA SAUNDERS on: 03/28/2024 04:31 PM   Modules accepted: Orders

## 2024-03-28 NOTE — Patient Instructions (Signed)
 UTI prevention / management:  UTI symptoms may include:  - Pain / burning / discomfort when urinating - Recent increase in urinary urgency (how quickly you feel like you need to rush to the bathroom) - Recent increase in urinary frequency (how often you are urinating) - Fever - Acute mental status change / confusion - Fatigue / Feeling tired - Weakness - Note: Urine color, clarity, and odor are not considered to be clinically significant indicators of UTI and do not warrant urine testing unless patient is also experiencing UTI symptoms such as those listed above.  Difference between Urinalysis (urine dipstick test) and Urine culture / Why urine culture often needed to determine appropriate diagnosis and treatment of urologic symptoms: > Urinalysis (urine dipstick test): A quick office test used as an indicator to determine whether or not further testing is necessary (such as a urine culture, urine microscopy, etc.) The urinalysis cannot differentiate a true bacterial UTI or give a definitive diagnosis for the findings.  > Urine culture: May be performed based on the findings of a urinalysis to evaluate for UTI. Grows out on a petri dish for 48-72 hours. Provides important information about: whether or not bacterial growth is present and if so: what the predominant bacteria is which antibiotics will work best against that bacteria That information is important so that we can diagnose and treat patients appropriately as there are other conditions which may mimic UTls which must not be missed (such as cancer, interstitial cystitis, stones, etc.). Assists us  with antibiotic stewardship to minimize patient's risk for developing antibiotic resistance (getting to a point where no antibiotics work anymore).  Options when UTI symptoms occur: 1. Call [facility] [place] and request to speak with triage nurse (phone # [phone], select option 3). In accordance with clinic guidelines  the nurse will determine next steps based on patient-reported symptoms, which may include: same-day lab visit to provide urine specimen, recommendation to schedule Urology office visit appointment for further evaluation, recommendation to proceed to ER, etc. 2. Call your Primary Care Provider (PCP) office to request urgent / same-day visit. Be sure to request for urine culture to be ordered and have results faxed to Urology (fax # [phone]).  3. Go to urgent care. Be sure to request for urine culture to be ordered and have results faxed to Urology (fax # [phone]).   For bladder pain/ burning with urination: - Can take over-the-counter Pyridium  (phenazopyridine ; commonly known under the AZO brand) for a few days as needed. Limit use to no more than 3 days consecutively due to risk for methemoglobinemia, liver function issues, and bone health damage with long term use of Pyridium . - Alternative: Prescription urinary analgesics (such as Uribel, Urogesic blue, Urelle, Uro-MP). Often expensive / poorly covered by insurance unfortunately.  Options / recommendations for UTI prevention: - Topical vaginal estrogen for vaginal atrophy (aka Genitourinary Syndrome of Menopause (GSM)). - Adequate daily fluid intake to flush out the urinary tract. - Go to the bathroom to urinate every 4-6 hours while awake to minimize urinary stasis / bacterial overgrowth in the bladder. - Proanthocyanidin (PAC) supplement 36 mg daily; must be soluble (insoluble form of PAC will be ineffective). Recommended brand: Ellura. This is an over-the-counter supplement (often must be found/ purchased online) supplement derived from cranberries with concentrated active component: Proanthocyanidin (PAC) 36 mg daily. Decreases bacterial adherence to bladder lining.  - D-mannose powder (2 grams daily). This is an over-the-counter supplement which decreases bacterial adherence to bladder lining (it  is a sugar that inhibits bacterial  adherence to urothelial cells by binding to the pili of enteric bacteria). Take as per manufacturer recommendation. Can be used as an alternative or in addition to the concentrated cranberry supplement.  - Vitamin C supplement to acidify urine to minimize bacterial growth.  - Probiotic to maintain healthy vaginal microbiome to suppress bacteria at urethral opening. Brand recommendations: Estill Hemming (includes probiotic & D-mannose ), Feminine Balance (highest concentration of lactobacillus) or Hyperbiotic Pro 15.  Note for patients with diabetes:  - Be aware that D-mannose contains sugar.  Note for patients with interstitial cystitis (IC):  - Patients with IC should typically avoid cranberry/ PAC supplements and Vitamin C supplements due to their acidity, which may exacerbate IC-related bladder pain. - Symptoms of true bacterial UTI can overlap / mimic symptoms of an IC flare up. Antibiotic use is NOT indicated for IC flare ups. Urine culture needed prior to antibiotic treatment for IC patients. The goal is to minimize your risk for developing antibiotic-resistant bacteria.    Vaginal atrophy I Genitourinary syndrome of menopause (GSM):  What it is: Changes in the vaginal environment (including the vulva and urethra) including: Thinning of the epithelium (skin/ mucosa surface) Can contribute to urinary urgency and frequency Can contribute to dryness, itching, irritation of the vulvar and vaginal tissue Can contribute to pain with intercourse Can contribute to physical changes of the labia, vulva, and vagina such as: Narrowing of the vaginal opening Decreased vaginal length Loss of labial architecture Labial adhesions Pale color of vulvovaginal tissue Loss of pubic hair Allows bacteria to become adherent Results in increased risk for urinary tract infection (UTI) due to bacterial overgrowth and migration up the urethra into the bladder Change in vaginal pH (acid/ base balance) Allows for  alteration / disruption of the normal bacterial flora / microbiome Results in increased risk for urinary tract infection (UTI) due to bacterial overgrowth  Treatment options: Over-the-counter lubricants (see list below). Prescription vaginal estrogen replacement. Options: Topical vaginal estrogen (estradiol) cream: (Estrace, Premarin, compounded) Apply as directed Estring vaginal ring Exchanged every 3 months (either at home or in office by provider) Vagifem vaginal tablet Inserted nightly for 2 weeks then twice a week (long term) lntrarosa vaginal suppository Vaginal DHEA: converts to estrogen in vaginal tissue without systemic effect Inserted nightly (long term) 3. Vaginal laser therapy (Mona Edwina Gram touch) Performed in 3 treatments each 6 weeks apart (available in our [place] office). Can feel like a sunburn for 3-4 days after each treatment until new skin heals in. Usually not covered by insurance. Estimated cost is $1500 for all 3 sessions.  FYI regarding prescription vaginal estrogen treatment options: - All topical vaginal estrogen replacement options are equivalent in terms of efficacy. - Topical vaginal estrogen replacement will take about 3 months to be effective. - OK to have sex with any of the topical vaginal estrogen replacement options. - Topical vaginal estrogen replacement may sting/burn initially due to severe dryness, which will improve with ongoing treatment. - Studies have demonstrated negligible systemic absorption of low-dose intravaginal estrogen cream therefore the risk for cancer development or recurrence with this medication is minimal.  Genitourinary Syndrome of Menopause: AUA/SUFU/AUGS Guideline (2025) [email or web address]  Topical vaginal estrogen cream safe to use with breast cancer  history [email or web address]  Topical vaginal estrogen cream safe to use with blood clot history [email or web address]   Lubricants and Moisturizers for Treating Genitourinary Syndrome of Menopause and Vulvovaginal Atrophy Treatment Comments I Available Products   Lubricants   Water -based  Ingredients: Deionized water , glycerin , propylene glycol; latex safe; rare irritation; dry out with extended sexual activity Astroglide, Good Clean Love, K-Y Jelly, Natural, Organic, Pink, Sliquid, Sylk, Yes    Oil Based Ingredients: avocado, olive, peanut, corn; latex safe; can be used with silicone products; staining; safe (unless peanut allergy); non-irritating Coconut oil, vegetable oil, vitamin E oil  Silicone-Based Ingredients: Silicone polymers; staining; typically nonirritating, long lasting; waterproof; should not be used with silicone dilators, sexual toys, or gynecologic products Astroglide X, Oceanus Ultra Pure, Pink Silicone, Pjur Eros, Replens Silky Smooth, Silicone Premium JO, SKYN, Uberlube, [employer] Based Minimize harm to sperm motility; designed for couples trying to conceive:  Astroglide TTC, Conceive Plus, PreSeed, Yes Baby  Fertility Friendly Minimize harm to sperm motility; designed for couples trying to conceive:  Astroglide, TTC, Conceive Plus, PreSeed, Yes Baby  Vaginal Moisturizers   Vaginal Moisturizers For maintenance use 1 to 3 times weekly; can benefit women with dryness, chafing with AOL, and recurrent vaginal infections irrespective of sexual activity timing Balance Active Menopause Vaginal Moisturizing Lubricant, Canesintima Intimate Moisturizer, Replens, Rephresh, Sylk Natural Intimate Moisturizer, Yes Vaginal  Moisturizer  Hybrids Properties of both water  and silicone-based products (combination of a vaginal lubricant and moisturizer); Non-irritating; good option for women with allergies and sensitivities Lubrigyn, Luvena  Suppositories Hyaluronic acid to retain moisture Revaree  Vulvar Soothing Creams/Oils    Medicated Creams Pain and burn relief; Ingredients: 4% Lidocaine , Aloe Vera gel Releveum ([place] Titanic)  Non-Medicated Creams For anti-itch and moisture/maintenance; Ingredients: Coconut oil, Avocado oil, Shea Butter, Olive oil, Vitamin E Vajuvenate, Vmagic  Oils for moisture/maintenance: Coconut oil, Vitamin E oil, Emu oil

## 2024-03-29 LAB — URINALYSIS, ROUTINE W REFLEX MICROSCOPIC
Bilirubin, UA: NEGATIVE
Glucose, UA: NEGATIVE
Ketones, UA: NEGATIVE
Leukocytes,UA: NEGATIVE
Nitrite, UA: NEGATIVE
Protein,UA: NEGATIVE
Specific Gravity, UA: 1.01 (ref 1.005–1.030)
Urobilinogen, Ur: 0.2 mg/dL (ref 0.2–1.0)
pH, UA: 6.5 (ref 5.0–7.5)

## 2024-03-29 LAB — MICROSCOPIC EXAMINATION

## 2024-05-21 NOTE — Progress Notes (Signed)
 [facility] - Clinic Note  05/23/2024     CHIEF COMPLAINT Patient presents for Retina Follow Up  HISTORY OF PRESENT ILLNESS: Darlene Crawford is a 83 y.o. 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  This started 8 weeks ago.  I, the attending physician,  performed the HPI with the patient and updated documentation appropriately.        Comments   Patient here for 8 weeks retina follow up for exu ARMD OD. Patient states vision can't tell any difference. Hope doing better. Can't read small print on TV. No eye pain.       Last edited by Valdemar Rogue, MD on 05/23/2024  3:26 PM.     Referring physician: Hairfield, Keavie C [address] [place],  [place] [address]  HISTORICAL INFORMATION:  Selected notes from the MEDICAL RECORD NUMBER Referred by Dr. FABIENE Moats for concern of ARMD OD with progression to CNVM vs CSR   CURRENT MEDICATIONS: Current Outpatient Medications (Ophthalmic Drugs)  Medication Sig   ARTIFICIAL TEAR SOLUTION OP Place 1 drop into both eyes daily as needed (dry eyes).   Current Facility-Administered Medications (Ophthalmic Drugs)  Medication Route   aflibercept  (EYLEA ) SOLN 2 mg Intravitreal   aflibercept  (EYLEA ) SOLN 2 mg Intravitreal   aflibercept  (EYLEA ) SOLN 2 mg Intravitreal   Current Outpatient Medications (Other)  Medication Sig   acidophilus (RISAQUAD) CAPS capsule Take 1 capsule by mouth daily.   atorvastatin (LIPITOR) 10 MG tablet Take 10 mg by mouth at bedtime.    Cholecalciferol (VITAMIN D) 50 MCG (2000 UT) tablet Take 4,000 Units by mouth at bedtime.    Cyanocobalamin (B-12 PO) Take 1 Dose by mouth 2 (two) times a week.   estradiol  (ESTRACE ) 0.1 MG/GM vaginal cream Discard plastic applicator. Insert a blueberry size amount (approximately 1 gram) of cream on fingertip inside vagina at bedtime every night for 1 week then every other night. For long term  use.   fluticasone (FLONASE) 50 MCG/ACT nasal spray Place 1 spray into both nostrils daily as needed for allergies or rhinitis.   hydrochlorothiazide (HYDRODIURIL) 25 MG tablet Take 25 mg by mouth daily. 1/2 tablet am, 1/2 tablet pm   levothyroxine (SYNTHROID, LEVOTHROID) 100 MCG tablet Take 100 mcg by mouth daily before breakfast.   loratadine (CLARITIN) 10 MG tablet Take 10 mg by mouth daily as needed for allergies.   losartan (COZAAR) 100 MG tablet Take 100 mg by mouth daily.    Multiple Vitamins-Minerals (PRESERVISION AREDS 2 PO) Take 1 tablet by mouth at bedtime.    sodium chloride  (OCEAN) 0.65 % SOLN nasal spray Place 1 spray into both nostrils as needed for congestion.   Melatonin 10 MG CAPS Take 10 mg by mouth at bedtime. (Patient not taking: Reported on 05/23/2024)   sertraline (ZOLOFT) 50 MG tablet Take 50 mg by mouth daily. (Patient not taking: Reported on 05/23/2024)   Current Facility-Administered Medications (Other)  Medication Route   Bevacizumab  (AVASTIN ) SOLN 1.25 mg Intravitreal   Bevacizumab  (AVASTIN ) SOLN 1.25 mg Intravitreal   Bevacizumab  (AVASTIN ) SOLN 1.25 mg Intravitreal   REVIEW OF SYSTEMS: ROS   Positive for: Genitourinary, Musculoskeletal, Endocrine, Eyes, Psychiatric, Allergic/Imm Negative for: Constitutional, Gastrointestinal, Neurological, Skin, HENT, Cardiovascular, Respiratory, Heme/Lymph Last edited by Orval Asberry RAMAN, COA on 05/23/2024 10:26 AM.       ALLERGIES Allergies  Allergen Reactions   Trilyte [Peg 3350 -Kcl-Na Bicarb-Nacl]     HEMATEMESIS   Vicodin [Hydrocodone-Acetaminophen] Nausea And Vomiting   PAST MEDICAL HISTORY Past Medical History:  Diagnosis Date   Anxiety and depression    Cataract    Hypercholesterolemia    Hypertension    Hypertensive retinopathy    OU   Hypothyroidism    Macular degeneration    OD   Osteoarthritis    PONV (postoperative nausea and vomiting)    Seasonal allergies    Past Surgical History:  Procedure  Laterality Date   BIOPSY  10/01/2018   Procedure: BIOPSY;  Surgeon: Harvey Margo CROME, MD;  Location: [facility];  Service: Endoscopy;;  gastric bx & gastric polyp   CATARACT EXTRACTION W/PHACO Right 12/23/2022   Procedure: CATARACT EXTRACTION PHACO AND INTRAOCULAR LENS PLACEMENT (IOC);  Surgeon: Juli Blunt, MD;  Location: [facility];  Service: Ophthalmology;  Laterality: Right;  CDE: 11.88   CATARACT EXTRACTION W/PHACO Left 02/24/2023   Procedure: CATARACT EXTRACTION PHACO AND INTRAOCULAR LENS PLACEMENT (IOC);  Surgeon: Juli Blunt, MD;  Location: [facility];  Service: Ophthalmology;  Laterality: Left;  CDE  5.82   COLONOSCOPY N/A 10/01/2018   Procedure: COLONOSCOPY;  Surgeon: Harvey Margo CROME, MD;  Location: [facility];  Service: Endoscopy;  Laterality: N/A;  10:30am   ESOPHAGOGASTRODUODENOSCOPY N/A 10/01/2018   Procedure: ESOPHAGOGASTRODUODENOSCOPY (EGD);  Surgeon: Harvey Margo CROME, MD;  Location: [facility];  Service: Endoscopy;  Laterality: N/A;   POLYPECTOMY  10/01/2018   Procedure: POLYPECTOMY;  Surgeon: Harvey Margo CROME, MD;  Location: [facility];  Service: Endoscopy;;  colon    TUBAL LIGATION     UTERINE FIBROID SURGERY     FAMILY HISTORY Family History  Problem Relation Age of Onset   Cancer Mother        lung   Stroke Father    Stroke Brother    Colon cancer Neg Hx    SOCIAL HISTORY Social History   Tobacco Use   Smoking status: Former    Current packs/day: 0.00    Average packs/day: 0.3 packs/day for 4.0 years (1.0 ttl pk-yrs)    Types: Cigarettes    Start date: 11/04/1960    Quit date: 11/04/1964    Years since quitting: 59.5   Smokeless tobacco: Never  Vaping Use   Vaping status: Never Used  Substance Use Topics   Alcohol  use: No   Drug use: No       OPHTHALMIC EXAM:  Base Eye Exam     Visual Acuity (Snellen - Linear)       Right Left   Dist [place] 20/40 20/20 -2         Tonometry (Tonopen, 10:24 AM)       Right Left   Pressure 13 14          Pupils       Dark Light Shape React APD   Right 3 2 Round Brisk None   Left 3 2 Round Brisk None         Visual Fields (Counting fingers)       Left Right    Full Full         Extraocular Movement       Right Left    Full, Ortho Full, Ortho         Neuro/Psych     Oriented x3: Yes   Mood/Affect: Normal         Dilation  Right eye: 1.0% Mydriacyl , 2.5% Phenylephrine  @ 10:24 AM  Patient wanted only OD dilated. Driving.          Slit Lamp and Fundus Exam     Slit Lamp Exam       Right Left   Lids/Lashes Dermatochalasis - upper lid Dermatochalasis - upper lid   Conjunctiva/Sclera White and quiet White and quiet   Cornea Arcus, well healed cataract wound Arcus, early nasal band K, 1+PEE   Anterior Chamber Deep, 1-2+ fine cell / pigment moderate depth, with narrow angle temporally   Iris Round and well dilated Round and well dilated   Lens PC IOL in good position PC IOL in good postition   Anterior Vitreous Vitreous syneresis, Posterior vitreous detachment, Weiss ring Vitreous syneresis         Fundus Exam       Right Left   Disc Pink and Sharp, Compact    C/D Ratio 0.2    Macula Flat, blunted foveal reflex; shallow central SRF, +drusen; +RPE mottling and clumping, small cluster of drusen and RPE mottling inferior fovea, no heme    Vessels attenuated, Tortuous    Periphery Attached, peripheral drusen / RPE changes nasally, peripheral cystoid degeneration            IMAGING AND PROCEDURES  Imaging and Procedures for 12/11/17  OCT, Retina - OU - Both Eyes       Right Eye Quality was good. Central Foveal Thickness: 259. Progression has been stable. Findings include no IRF, abnormal foveal contour, retinal drusen , pigment epithelial detachment, subretinal fluid, outer retinal atrophy (Central and inferior SRF and SRHM -- slightly increased).   Left Eye Quality was good. Central Foveal Thickness: 276. Progression has been stable. Findings  include normal foveal contour, no IRF, no SRF, retinal drusen .   Notes *Images captured and stored on drive  Diagnosis / Impression:  OD: Central and inferior SRF and SRHM -- slightly increased OS: NFP, No IRF/SRF  Clinical management:  See below  Abbreviations: NFP - Normal foveal profile. CME - cystoid macular edema. PED - pigment epithelial detachment. IRF - intraretinal fluid. SRF - subretinal fluid. EZ - ellipsoid zone. ERM - epiretinal membrane. ORA - outer retinal atrophy. ORT - outer retinal tubulation. SRHM - subretinal hyper-reflective material       Intravitreal Injection, Pharmacologic Agent - OD - Right Eye       Time Out 05/23/2024. 11:26 AM. Confirmed correct patient, procedure, site, and patient consented.   Anesthesia Topical anesthesia was used. Anesthetic medications included Lidocaine  2%, Proparacaine 0.5%.   Procedure Preparation included 5% betadine  to ocular surface, eyelid speculum. A (30g) needle was used.   Injection: 8 mg aflibercept  8 MG/0.07ML   Route: Intravitreal, Site: Right Eye   NDC: [id number], Lot: [id number], Expiration date: 11/26/2024, Waste: 0 mL   Post-op Post injection exam found visual acuity of at least counting fingers. The patient tolerated the procedure well. There were no complications. The patient received written and verbal post procedure care education. Post injection medications were not given.            ASSESSMENT/PLAN:    ICD-10-CM   1. Exudative age-related macular degeneration of right eye with active choroidal neovascularization (HCC)  H35.3211 OCT, Retina - OU - Both Eyes    Intravitreal Injection, Pharmacologic Agent - OD - Right Eye    aflibercept  (EYLEA  HD) ophthalmic injection 8 mg    2. Central serous chorioretinopathy of right eye  H35.711  3. Essential hypertension  I10     4. Hypertensive retinopathy of both eyes  H35.033     5. 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 Posterior  vitreous detachment of right eye  H43.811     7. Pseudophakia  Z96.1      1,2. Exudative age related macular degeneration vs CSCR OD  - FA (03.02.19) without leakage / pooling into area of SRF - review of systems reveals significant stressors in life, but no exogenous steroid use - S/P IVA #1 OD (09.27.19),  #2 (10.25.19), #3 (11.22.19) -- minimal response -- IVA resistance  =============================== - S/P IVE OD #1 (12.20.19), #2 (01.20.20), #3 (02.18.20), #4 (07.20.20) #5 (09.01.20), #6 (10.13.20), #7 (11.20.20), #8 (01.20.21), #9 (02.17.21), #10 (03.19.21), #11 (04.20.21), #12 (05.25.21), #13 (6.23.21), #14 (07.27.21), #15 (08.30.21), #16 (9.28.21), #17 (10.26.21), #18 (11.29.21), #19 (01.11.22), #20 (02.08.22), #21 (3.8.22), #22 (04.05.22), #23 (5.10.22), #24 (06.21.22), #25 (8.2.22), #26 (9.20.22), #27 (11.15.22), #28 (01.17.23), #29 (03.29.23), #30 (06.21.23), #31 (09.27.23), #32 (01.16.24), #33 (05.07.24), #34 (06.18.24), #35 (07.30.24) -- IVE resistance ================================== - s/p IVV OD #1 (09.03.24), #2 (10.03.24), #3 (11.06.24), #4 (12.04.24) #5(01.02.25) #6 (01.31.25 -- sample), #7 (sample -- 02.28.25), #8 (04.02.25), #9 (05.02.25) -- IVV resistance ================================== - s/p IVE HD #1 (06.04.25), #2 (07.02.25), #3 (07.30.25)  - lost to f/u from Feb 2020 to July 2020 due to COVID-19 concerns - today, BCVA OD stable at 20/40 - OCT shows Central and inferior SRF and SRHM -- slightly increased  at 8 wks  - recommend IVE HD OD #4 today, 09.25.25 -- with follow up in 8 wks  - pt wishes to proceed with IVE HD OD   - RBA of procedure discussed, questions answered  - informed consent obtained   - IVE HD informed consent form signed and scanned on 6.04.25  - see procedure note  - Eylea4U benefits investigation initiated -- approved for 2025  - Vabysmo  approved for 2025   - [employer] funding fully reinstated as of 04.02.25  - EyleaHD approved for 2025  -  f/u 8 weeks -- DFE/OCT, possible injection  3,4. Hypertensive retinopathy OU  - discussed importance of tight BP control  - stable  - monitor  5. Lattice degeneration OS  - patch of inferior lattice OS -- no RT, holes or SRF  - asymptomatic   - discussed findings, prognosis, and treatment options including observation  - monitor  6. PVD / vitreous syneresis OD-   - Discussed findings and prognosis  - No RT or RD on 360 peripheral exam  - Reviewed s/s of RT/RD  - strict return precautions for any such RT/RD symptoms  7. Pseudophakia OU  - s/p CE/IOL OD (Dr. Juli, 04.26.24, OS 02/24/23)  - IOL in good position, doing well  - monitor  Ophthalmic Meds Ordered this visit:  Meds ordered this encounter  Medications   aflibercept  (EYLEA  HD) ophthalmic injection 8 mg     Return in about 8 weeks (around 07/18/2024) for exu ARMD OD, DFE, OCT, Possible Injxn.  There are no Patient Instructions on file for this visit.   This document serves as a record of services personally performed by Redell JUDITHANN Hans, MD, PhD. It was created on their behalf by Auston Muzzy, COMT. The creation of this record is the provider's dictation and/or activities during the visit.  Electronically signed by: Auston Muzzy, COMT 05/23/24 3:29 PM  This document serves as a record of services personally performed  by Redell JUDITHANN Hans, MD, PhD. It was created on their behalf by Almetta Pesa, an ophthalmic technician. The creation of this record is the provider's dictation and/or activities during the visit.    Electronically signed by: Almetta Pesa, OA, 05/23/24  3:29 PM  Redell JUDITHANN Hans, M.D., Ph.D. Diseases & Surgery of the Retina and Vitreous [facility]  I have reviewed the above documentation for accuracy and completeness, and I agree with the above. Redell JUDITHANN Hans, M.D., Ph.D. 05/23/24 3:30 PM   Abbreviations: M myopia (nearsighted); A astigmatism; H hyperopia (farsighted);  P presbyopia; Mrx spectacle prescription;  CTL contact lenses; OD right eye; OS left eye; OU both eyes  XT exotropia; ET esotropia; PEK punctate epithelial keratitis; PEE punctate epithelial erosions; DES dry eye syndrome; MGD meibomian gland dysfunction; ATs artificial tears; PFAT's preservative free artificial tears; NSC nuclear sclerotic cataract; PSC posterior subcapsular cataract; ERM epi-retinal membrane; PVD posterior vitreous detachment; RD retinal detachment; DM diabetes mellitus; DR diabetic retinopathy; NPDR non-proliferative diabetic retinopathy; PDR proliferative diabetic retinopathy; CSME clinically significant macular edema; DME diabetic macular edema; dbh dot blot hemorrhages; CWS cotton wool spot; POAG primary open angle glaucoma; C/D cup-to-disc ratio; HVF humphrey visual field; GVF goldmann visual field; OCT optical coherence tomography; IOP intraocular pressure; BRVO Branch retinal vein occlusion; CRVO central retinal vein occlusion; CRAO central retinal artery occlusion; BRAO branch retinal artery occlusion; RT retinal tear; SB scleral buckle; PPV pars plana vitrectomy; VH Vitreous hemorrhage; PRP panretinal laser photocoagulation; IVK intravitreal kenalog; VMT vitreomacular traction; MH Macular hole;  NVD neovascularization of the disc; NVE neovascularization elsewhere; AREDS age related eye disease study; ARMD age related macular degeneration; POAG primary open angle glaucoma; EBMD epithelial/anterior basement membrane dystrophy; ACIOL anterior chamber intraocular lens; IOL intraocular lens; PCIOL posterior chamber intraocular lens; Phaco/IOL phacoemulsification with intraocular lens placement; PRK photorefractive keratectomy; LASIK laser assisted in situ keratomileusis; HTN hypertension; DM diabetes mellitus; COPD chronic obstructive pulmonary disease

## 2024-05-22 ENCOUNTER — Encounter (INDEPENDENT_AMBULATORY_CARE_PROVIDER_SITE_OTHER): Admitting: Ophthalmology

## 2024-05-23 ENCOUNTER — Ambulatory Visit (INDEPENDENT_AMBULATORY_CARE_PROVIDER_SITE_OTHER): Admitting: Ophthalmology

## 2024-05-23 ENCOUNTER — Encounter (INDEPENDENT_AMBULATORY_CARE_PROVIDER_SITE_OTHER): Payer: Self-pay | Admitting: Ophthalmology

## 2024-05-23 DIAGNOSIS — H43811 Vitreous degeneration, right eye: Secondary | ICD-10-CM | POA: Diagnosis not present

## 2024-05-23 DIAGNOSIS — H35033 Hypertensive retinopathy, bilateral: Secondary | ICD-10-CM | POA: Diagnosis not present

## 2024-05-23 DIAGNOSIS — H35711 Central serous chorioretinopathy, right eye: Secondary | ICD-10-CM

## 2024-05-23 DIAGNOSIS — H35412 Lattice degeneration of retina, left eye: Secondary | ICD-10-CM | POA: Diagnosis not present

## 2024-05-23 DIAGNOSIS — H353211 Exudative age-related macular degeneration, right eye, with active choroidal neovascularization: Secondary | ICD-10-CM | POA: Diagnosis not present

## 2024-05-23 DIAGNOSIS — Z961 Presence of intraocular lens: Secondary | ICD-10-CM | POA: Diagnosis not present

## 2024-05-23 DIAGNOSIS — I10 Essential (primary) hypertension: Secondary | ICD-10-CM

## 2024-05-23 MED ORDER — AFLIBERCEPT 8 MG/0.07ML IZ SOLN
8.0000 mg | INTRAVITREAL | Status: AC | PRN
  Administered 2024-05-23: 8 mg via INTRAVITREAL

## 2024-06-06 DIAGNOSIS — R52 Pain, unspecified: Secondary | ICD-10-CM | POA: Diagnosis not present

## 2024-06-06 DIAGNOSIS — M47816 Spondylosis without myelopathy or radiculopathy, lumbar region: Secondary | ICD-10-CM | POA: Diagnosis not present

## 2024-06-06 DIAGNOSIS — M5136 Other intervertebral disc degeneration, lumbar region with discogenic back pain only: Secondary | ICD-10-CM | POA: Diagnosis not present

## 2024-07-05 DIAGNOSIS — Z13820 Encounter for screening for osteoporosis: Secondary | ICD-10-CM | POA: Diagnosis not present

## 2024-07-08 NOTE — Progress Notes (Signed)
 [facility] - Clinic Note  07/17/2024     CHIEF COMPLAINT Patient presents for Retina Follow Up  HISTORY OF PRESENT ILLNESS: Darlene Crawford is a 83 y.o. 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  Severity is moderate.  Duration of 8 weeks.  Since onset it is stable.  I, the attending physician,  performed the HPI with the patient and updated documentation appropriately.        Comments   Pt here for 8 wk ret f/u exu ARMD OD. Pt states she feels theres been a gradual decline in [place] OU. Nothing dramatic but feels she needs to go back to wearing bifocal glasses. Not wearing any rx specs at this time. Using OTC gtts OU for drynes.      Last edited by Valdemar Rogue, MD on 07/17/2024  1:27 PM.     Patient feels that she needs glasses.   Referring physician: Hairfield, Keavie C [address] [place],  [place] [address]  HISTORICAL INFORMATION:  Selected notes from the MEDICAL RECORD NUMBER Referred by Dr. FABIENE Moats for concern of ARMD OD with progression to CNVM vs CSR   CURRENT MEDICATIONS: Current Outpatient Medications (Ophthalmic Drugs)  Medication Sig   ARTIFICIAL TEAR SOLUTION OP Place 1 drop into both eyes daily as needed (dry eyes).   Current Facility-Administered Medications (Ophthalmic Drugs)  Medication Route   aflibercept  (EYLEA ) SOLN 2 mg Intravitreal   aflibercept  (EYLEA ) SOLN 2 mg Intravitreal   aflibercept  (EYLEA ) SOLN 2 mg Intravitreal   Current Outpatient Medications (Other)  Medication Sig   acidophilus (RISAQUAD) CAPS capsule Take 1 capsule by mouth daily.   atorvastatin (LIPITOR) 10 MG tablet Take 10 mg by mouth at bedtime.    Cholecalciferol (VITAMIN D) 50 MCG (2000 UT) tablet Take 4,000 Units by mouth at bedtime.    Cyanocobalamin (B-12 PO) Take 1 Dose by mouth 2 (two) times a week.   estradiol  (ESTRACE ) 0.1 MG/GM vaginal cream Discard plastic applicator.  Insert a blueberry size amount (approximately 1 gram) of cream on fingertip inside vagina at bedtime every night for 1 week then every other night. For long term use.   fluticasone (FLONASE) 50 MCG/ACT nasal spray Place 1 spray into both nostrils daily as needed for allergies or rhinitis.   hydrochlorothiazide (HYDRODIURIL) 25 MG tablet Take 25 mg by mouth daily. 1/2 tablet am, 1/2 tablet pm   levothyroxine (SYNTHROID, LEVOTHROID) 100 MCG tablet Take 100 mcg by mouth daily before breakfast.   loratadine (CLARITIN) 10 MG tablet Take 10 mg by mouth daily as needed for allergies.   losartan (COZAAR) 100 MG tablet Take 100 mg by mouth daily.    Multiple Vitamins-Minerals (PRESERVISION AREDS 2 PO) Take 1 tablet by mouth at bedtime.    sodium chloride  (OCEAN) 0.65 % SOLN nasal spray Place 1 spray into both nostrils as needed for congestion.   Melatonin 10 MG CAPS Take 10 mg by mouth at bedtime. (Patient not taking: Reported on 07/17/2024)   sertraline (ZOLOFT) 50 MG tablet Take 50 mg by mouth daily. (Patient not taking: Reported on 07/17/2024)   Current Facility-Administered Medications (Other)  Medication Route   Bevacizumab  (AVASTIN ) SOLN 1.25 mg Intravitreal   Bevacizumab  (AVASTIN ) SOLN 1.25 mg Intravitreal   Bevacizumab  (AVASTIN ) SOLN 1.25 mg Intravitreal   REVIEW OF SYSTEMS: ROS   Positive for: Genitourinary,  Musculoskeletal, Endocrine, Eyes, Psychiatric, Allergic/Imm Negative for: Constitutional, Gastrointestinal, Neurological, Skin, HENT, Cardiovascular, Respiratory, Heme/Lymph Last edited by Antonetta Almetta BRAVO, COT on 07/17/2024 12:33 PM.        ALLERGIES Allergies  Allergen Reactions   Trilyte [Peg 3350 -Kcl-Na Bicarb-Nacl]     HEMATEMESIS   Vicodin [Hydrocodone-Acetaminophen] Nausea And Vomiting   PAST MEDICAL HISTORY Past Medical History:  Diagnosis Date   Anxiety and depression    Cataract    Hypercholesterolemia    Hypertension    Hypertensive retinopathy    OU    Hypothyroidism    Macular degeneration    OD   Osteoarthritis    PONV (postoperative nausea and vomiting)    Seasonal allergies    Past Surgical History:  Procedure Laterality Date   BIOPSY  10/01/2018   Procedure: BIOPSY;  Surgeon: Harvey Margo CROME, MD;  Location: [facility];  Service: Endoscopy;;  gastric bx & gastric polyp   CATARACT EXTRACTION W/PHACO Right 12/23/2022   Procedure: CATARACT EXTRACTION PHACO AND INTRAOCULAR LENS PLACEMENT (IOC);  Surgeon: Juli Blunt, MD;  Location: [facility];  Service: Ophthalmology;  Laterality: Right;  CDE: 11.88   CATARACT EXTRACTION W/PHACO Left 02/24/2023   Procedure: CATARACT EXTRACTION PHACO AND INTRAOCULAR LENS PLACEMENT (IOC);  Surgeon: Juli Blunt, MD;  Location: [facility];  Service: Ophthalmology;  Laterality: Left;  CDE  5.82   COLONOSCOPY N/A 10/01/2018   Procedure: COLONOSCOPY;  Surgeon: Harvey Margo CROME, MD;  Location: [facility];  Service: Endoscopy;  Laterality: N/A;  10:30am   ESOPHAGOGASTRODUODENOSCOPY N/A 10/01/2018   Procedure: ESOPHAGOGASTRODUODENOSCOPY (EGD);  Surgeon: Harvey Margo CROME, MD;  [facility]: [facility];  Service: Endoscopy;  Laterality: N/A;   POLYPECTOMY  10/01/2018   Procedure: POLYPECTOMY;  Surgeon: Harvey Margo CROME, MD;  Location: [facility];  Service: Endoscopy;;  colon    TUBAL LIGATION     UTERINE FIBROID SURGERY     FAMILY HISTORY Family History  Problem Relation Age of Onset   Cancer Mother        lung   Stroke Father    Stroke Brother    Colon cancer Neg Hx    SOCIAL HISTORY Social History   Tobacco Use   Smoking status: Former    Current packs/day: 0.00    Average packs/day: 0.3 packs/day for 4.0 years (1.0 ttl pk-yrs)    Types: Cigarettes    Start date: 11/04/1960    Quit date: 11/04/1964    Years since quitting: 59.7   Smokeless tobacco: Never  Vaping Use   Vaping status: Never Used  Substance Use Topics   Alcohol  use: No   Drug use: No       OPHTHALMIC EXAM:  Base Eye Exam      Visual Acuity (Snellen - Linear)       Right Left   Dist [place] 20/80 -2 20/25   Dist ph [place] 20/50 -2 20/20 -1  Pt having to move her head around to see letters w/o pinhole MS        Tonometry (Tonopen, 12:42 PM)       Right Left   Pressure 16 9         Pupils       Pupils Dark Light Shape React APD   Right PERRL 3 2 Round Brisk None   Left PERRL 3 2 Round Brisk None         Visual Fields (Counting fingers)       Left Right  Full Full         Extraocular Movement       Right Left    Full, Ortho Full, Ortho         Neuro/Psych     Oriented x3: Yes   Mood/Affect: Normal         Dilation     Both eyes: 1.0% Mydriacyl , 2.5% Phenylephrine  @ 12:43 PM           Slit Lamp and Fundus Exam     Slit Lamp Exam       Right Left   Lids/Lashes Dermatochalasis - upper lid Dermatochalasis - upper lid   Conjunctiva/Sclera White and quiet White and quiet   Cornea Arcus, well healed cataract wound, 1+ Punctate epithelial erosions, Debris in tear film Arcus, early nasal band K, 1+PEE, Debris in tear film, Well healed temporal cataract wound   Anterior Chamber Deep, 0.5+ fine cell / pigment Deep and clear   Iris Round and well dilated Round and moderately dilated   Lens PC IOL in good position, 1+2+ Posterior capsular opacification PC IOL in good postition, trace Posterior capsular opacification   Anterior Vitreous Vitreous syneresis, Posterior vitreous detachment, Weiss ring Vitreous syneresis         Fundus Exam       Right Left   Disc Pink and Sharp, Compact Pink and Sharp, Tilted disc   C/D Ratio 0.2 0.3   Macula Flat, blunted foveal reflex; shallow central SRF- improved, +drusen; +RPE mottling and clumping, small cluster of drusen and RPE mottling inferior fovea, no heme Flat, good foveal reflex, Retinal pigment epithelial mottling, superior para-foveal focal area of RPE atrophy -- stable, rare drusen, No heme or edema   Vessels attenuated, Tortuous  Vascular attenuation, Tortuous   Periphery Attached, peripheral drusen / RPE changes nasally, peripheral cystoid degeneration Attached, very mild lattice at 0430, mild patch of Lattice degeneration at 0600 -- stable, scattered peripheral drusen, peripheral cystoid degeneration           Refraction     Manifest Refraction       Sphere Cylinder Dist VA   Right -0.25 Sphere 20/NI   Left     Had to move head back and forth in phoropter          IMAGING AND PROCEDURES  Imaging and Procedures for 12/11/17  OCT, Retina - OU - Both Eyes       Right Eye Quality was good. Central Foveal Thickness: 246. Progression has improved. Findings include no IRF, abnormal foveal contour, retinal drusen , pigment epithelial detachment, subretinal fluid, outer retinal atrophy (Central and inferior SRF and SRHM -- improved, no fluid).   Left Eye Quality was good. Central Foveal Thickness: 272. Progression has been stable. Findings include normal foveal contour, no IRF, no SRF, retinal drusen .   Notes *Images captured and stored on drive  Diagnosis / Impression:  OD: Central and inferior SRF and SRHM -- improved, no fluid OS: NFP, No IRF/SRF  Clinical management:  See below  Abbreviations: NFP - Normal foveal profile. CME - cystoid macular edema. PED - pigment epithelial detachment. IRF - intraretinal fluid. SRF - subretinal fluid. EZ - ellipsoid zone. ERM - epiretinal membrane. ORA - outer retinal atrophy. ORT - outer retinal tubulation. SRHM - subretinal hyper-reflective material       Intravitreal Injection, Pharmacologic Agent - OD - Right Eye       Time Out 07/17/2024. 12:49 PM. Confirmed correct patient, procedure, site, and  patient consented.   Anesthesia Topical anesthesia was used. Anesthetic medications included Lidocaine  2%, Proparacaine 0.5%.   Procedure Preparation included 5% betadine  to ocular surface, eyelid speculum. A (30g) needle was used.   Injection: 8 mg  aflibercept  8 MG/0.07ML   Route: Intravitreal, Site: Right Eye   NDC: [id number], Lot: [id number], Expiration date: 01/26/2025, Waste: 0 mL   Post-op Post injection exam found visual acuity of at least counting fingers. The patient tolerated the procedure well. There were no complications. The patient received written and verbal post procedure care education. Post injection medications were not given.             ASSESSMENT/PLAN:    ICD-10-CM   1. Exudative age-related macular degeneration of right eye with active choroidal neovascularization (HCC)  H35.3211 OCT, Retina - OU - Both Eyes    Intravitreal Injection, Pharmacologic Agent - OD - Right Eye    aflibercept  (EYLEA  HD) ophthalmic injection 8 mg    2. 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 Essential hypertension  I10     4. Hypertensive retinopathy of both eyes  H35.033     5. 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 Posterior vitreous detachment of right eye  H43.811     7. Pseudophakia  Z96.1      1,2. Exudative age related macular degeneration vs CSCR OD  - FA (03.02.19) without leakage / pooling into area of SRF - review of systems reveals significant stressors in life, but no exogenous steroid use - S/P IVA #1 OD (09.27.19), #2 (10.25.19), #3 (11.22.19) -- minimal response  -- IVA resistance  =============================== - S/P IVE OD #1 (12.20.19), #2 (01.20.20), #3 (02.18.20), #4 (07.20.20) #5 (09.01.20), #6 (10.13.20), #7 (11.20.20), #8 (01.20.21), #9 (02.17.21), #10 (03.19.21), #11 (04.20.21), #12 (05.25.21), #13 (6.23.21), #14 (07.27.21), #15 (08.30.21), #16 (9.28.21), #17 (10.26.21), #18 (11.29.21), #19 (01.11.22), #20 (02.08.22), #21 (03.08.22), #22 (04.05.22), #23 (5.10.22), #24 (06.21.22), #25 (08.02.22), #26 (09.20.22), #27 (11.15.22), #28 (01.17.23), #29 (03.29.23), #30 (06.21.23), #31 (09.27.23), #32 (01.16.24), #33 (05.07.24), #34 (06.18.24), #35 (07.30.24)  -- IVE  resistance =============== - s/p IVV OD #1 (09.03.24), #2 (10.03.24), #3 (11.06.24), #4 (12.04.24) #5 (01.02.25) #6 (01.31.25 sample), #7 (sample 02.28.25), #8 (04.02.25), #9 (05.02.25)  -- IVV resistance =============== - s/p IVE HD #1 (06.04.25), #2 (07.02.25), #3 (07.30.25), #4 (09.25.25)  - lost to f/u from Feb 2020 to July 2020 due to COVID-19 concerns - today, BCVA OD 20/50 from 20/40 - OCT shows Central and inferior SRF and SRHM -- improved, no fluid at 8 wks - recommend IVE HD OD #5 today, 11.19.25 -- with follow up in 8 wks  - pt wishes to proceed with IVE HD OD   - RBA of procedure discussed, questions answered  - informed consent obtained   - IVE HD informed consent form signed and scanned on 6.04.25  - see procedure note  - Eylea4U benefits investigation initiated -- approved for 2025  - Vabysmo  approved for 2025   - [employer] funding fully reinstated as of 04.02.25  - EyleaHD approved for 2025  - f/u 8 weeks -- DFE/OCT, possible injection  3,4. Hypertensive retinopathy OU  - discussed importance of tight BP control  - stable  - monitor  5. Lattice degeneration OS  - patch of inferior lattice OS -- no RT, holes or SRF  - asymptomatic  - discussed findings, prognosis, and treatment options including observation  - monitor  6. PVD / vitreous syneresis OD-   - Discussed findings and prognosis  - No RT or RD on 360 peripheral exam  - Reviewed s/s of RT/RD  - strict return precautions for any such RT/RD symptoms  7. Pseudophakia OU  - s/p CE/IOL OD (Dr. Juli, 04.26.24, OS 02/24/23)  - IOL in good position, doing well  - monitor  Ophthalmic Meds Ordered this visit:  Meds ordered this encounter  Medications   aflibercept  (EYLEA  HD) ophthalmic injection 8 mg     Return in about 8 weeks (around 09/11/2024) for f/u, Ex. AMD, DFE, OCT, Possible, IVE HD, OD.  There are no Patient Instructions on file for this visit.   This document serves as a record of services  personally performed by Redell JUDITHANN Hans, MD, PhD. It was created on their behalf by Almetta Pesa, an ophthalmic technician. The creation of this record is the provider's dictation and/or activities during the visit.    Electronically signed by: Almetta Pesa, OA, 07/21/24  7:56 PM  This document serves as a record of services personally performed by Redell JUDITHANN Hans, MD, PhD. It was created on their behalf by Wanda GEANNIE Keens, COT an ophthalmic technician. The creation of this record is the provider's dictation and/or activities during the visit.    Electronically signed by:  Wanda GEANNIE Keens, COT  07/21/24 7:56 PM  Redell JUDITHANN Hans, M.D., Ph.D. Diseases & Surgery of the Retina and Vitreous [facility]  I have reviewed the above documentation for accuracy and completeness, and I agree with the above. Redell JUDITHANN Hans, M.D., Ph.D. 07/21/24 7:57 PM    Abbreviations: M myopia (nearsighted); A astigmatism; H hyperopia (farsighted); P presbyopia; Mrx spectacle prescription;  CTL contact lenses; OD right eye; OS left eye; OU both eyes  XT exotropia; ET esotropia; PEK punctate epithelial keratitis; PEE punctate epithelial erosions; DES dry eye syndrome; MGD meibomian gland dysfunction; ATs artificial tears; PFAT's preservative free artificial tears; NSC nuclear sclerotic cataract; PSC posterior subcapsular cataract; ERM epi-retinal membrane; PVD posterior vitreous detachment; RD retinal detachment; DM diabetes mellitus; DR diabetic retinopathy; NPDR non-proliferative diabetic retinopathy; PDR proliferative diabetic retinopathy; CSME clinically significant macular edema; DME diabetic macular edema; dbh dot blot hemorrhages; CWS cotton wool spot; POAG primary open angle glaucoma; C/D cup-to-disc ratio; HVF humphrey visual field; GVF goldmann visual field; OCT optical coherence tomography; IOP intraocular pressure; BRVO Branch retinal vein occlusion; CRVO central retinal vein  occlusion; CRAO central retinal artery occlusion; BRAO branch retinal artery occlusion; RT retinal tear; SB scleral buckle; PPV pars plana vitrectomy; VH Vitreous hemorrhage; PRP panretinal laser photocoagulation; IVK intravitreal kenalog; VMT vitreomacular traction; MH Macular hole;  NVD neovascularization of the disc; NVE neovascularization elsewhere; AREDS age related eye disease study; ARMD age related macular degeneration; POAG primary open angle glaucoma; EBMD epithelial/anterior basement membrane dystrophy; ACIOL anterior chamber intraocular lens; IOL intraocular lens; PCIOL posterior chamber intraocular lens; Phaco/IOL phacoemulsification with intraocular lens placement; PRK photorefractive keratectomy; LASIK laser assisted in situ keratomileusis; HTN hypertension; DM diabetes mellitus; COPD chronic obstructive pulmonary disease

## 2024-07-17 ENCOUNTER — Ambulatory Visit (INDEPENDENT_AMBULATORY_CARE_PROVIDER_SITE_OTHER): Admitting: Ophthalmology

## 2024-07-17 ENCOUNTER — Encounter (INDEPENDENT_AMBULATORY_CARE_PROVIDER_SITE_OTHER): Payer: Self-pay | Admitting: Ophthalmology

## 2024-07-17 DIAGNOSIS — H35412 Lattice degeneration of retina, left eye: Secondary | ICD-10-CM

## 2024-07-17 DIAGNOSIS — H35033 Hypertensive retinopathy, bilateral: Secondary | ICD-10-CM | POA: Diagnosis not present

## 2024-07-17 DIAGNOSIS — H353211 Exudative age-related macular degeneration, right eye, with active choroidal neovascularization: Secondary | ICD-10-CM

## 2024-07-17 DIAGNOSIS — I10 Essential (primary) hypertension: Secondary | ICD-10-CM | POA: Diagnosis not present

## 2024-07-17 DIAGNOSIS — H35711 Central serous chorioretinopathy, right eye: Secondary | ICD-10-CM

## 2024-07-17 DIAGNOSIS — Z961 Presence of intraocular lens: Secondary | ICD-10-CM

## 2024-07-17 DIAGNOSIS — H43811 Vitreous degeneration, right eye: Secondary | ICD-10-CM

## 2024-07-17 MED ORDER — AFLIBERCEPT 8 MG/0.07ML IZ SOLN
8.0000 mg | INTRAVITREAL | Status: AC | PRN
  Administered 2024-07-17: 8 mg via INTRAVITREAL

## 2024-08-23 ENCOUNTER — Other Ambulatory Visit: Payer: Self-pay | Admitting: Nurse Practitioner

## 2024-08-23 DIAGNOSIS — Z1231 Encounter for screening mammogram for malignant neoplasm of breast: Secondary | ICD-10-CM

## 2024-08-27 ENCOUNTER — Ambulatory Visit
Admission: RE | Admit: 2024-08-27 | Discharge: 2024-08-27 | Disposition: A | Discharge status: Home / Self Care | Source: Ambulatory Visit | Attending: Nurse Practitioner | Admitting: Nurse Practitioner

## 2024-08-27 DIAGNOSIS — Z1231 Encounter for screening mammogram for malignant neoplasm of breast: Secondary | ICD-10-CM

## 2024-09-02 NOTE — Progress Notes (Signed)
 " [facility] - Clinic Note  09/11/2024     CHIEF COMPLAINT Patient presents for Retina Follow Up  HISTORY OF PRESENT ILLNESS: Darlene Crawford is a 84 y.o. 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  Severity is moderate.  Duration of 8 weeks.  Since onset it is stable.  I, the attending physician,  performed the HPI with the patient and updated documentation appropriately.        Comments   8 week Retina eval. Patient states she had a Yag od. Dr.Snyder did yag in Dec.31 2025. Vision seems better      Last edited by Valdemar Rogue, MD on 09/11/2024 11:19 PM.    Patient feels that vision seems better. S/p YAG OD.   Referring physician: Hairfield, Keavie C [address] [place],  [place] [address]  HISTORICAL INFORMATION:  Selected notes from the MEDICAL RECORD NUMBER Referred by Dr. FABIENE Moats for concern of ARMD OD with progression to CNVM vs CSR   CURRENT MEDICATIONS: Current Outpatient Medications (Ophthalmic Drugs)  Medication Sig   ARTIFICIAL TEAR SOLUTION OP Place 1 drop into both eyes daily as needed (dry eyes).   Current Facility-Administered Medications (Ophthalmic Drugs)  Medication Route   aflibercept  (EYLEA ) SOLN 2 mg Intravitreal   aflibercept  (EYLEA ) SOLN 2 mg Intravitreal   aflibercept  (EYLEA ) SOLN 2 mg Intravitreal   Current Outpatient Medications (Other)  Medication Sig   atorvastatin (LIPITOR) 10 MG tablet Take 10 mg by mouth at bedtime.    Cholecalciferol (VITAMIN D) 50 MCG (2000 UT) tablet Take 4,000 Units by mouth at bedtime.    estradiol  (ESTRACE ) 0.1 MG/GM vaginal cream Discard plastic applicator. Insert a blueberry size amount (approximately 1 gram) of cream on fingertip inside vagina at bedtime every night for 1 week then every other night. For long term use.   fluticasone (FLONASE) 50 MCG/ACT nasal spray Place 1 spray into both nostrils daily as needed for  allergies or rhinitis.   levothyroxine (SYNTHROID, LEVOTHROID) 100 MCG tablet Take 100 mcg by mouth daily before breakfast.   loratadine (CLARITIN) 10 MG tablet Take 10 mg by mouth daily as needed for allergies.   losartan (COZAAR) 100 MG tablet Take 100 mg by mouth daily.    Multiple Vitamins-Minerals (PRESERVISION AREDS 2 PO) Take 1 tablet by mouth at bedtime.    sodium chloride  (OCEAN) 0.65 % SOLN nasal spray Place 1 spray into both nostrils as needed for congestion.   acidophilus (RISAQUAD) CAPS capsule Take 1 capsule by mouth daily.   Cyanocobalamin (B-12 PO) Take 1 Dose by mouth 2 (two) times a week.   hydrochlorothiazide (HYDRODIURIL) 25 MG tablet Take 25 mg by mouth daily. 1/2 tablet am, 1/2 tablet pm   Melatonin 10 MG CAPS Take 10 mg by mouth at bedtime. (Patient not taking: Reported on 07/17/2024)   sertraline (ZOLOFT) 50 MG tablet Take 50 mg by mouth daily. (Patient not taking: Reported on 07/17/2024)   Current Facility-Administered Medications (Other)  Medication Route   Bevacizumab  (AVASTIN ) SOLN 1.25 mg Intravitreal   Bevacizumab  (AVASTIN ) SOLN 1.25 mg Intravitreal   Bevacizumab  (AVASTIN ) SOLN 1.25 mg Intravitreal   REVIEW OF SYSTEMS: ROS   Positive for: Genitourinary, Musculoskeletal, Endocrine, Eyes, Psychiatric, Allergic/Imm Negative for: Constitutional, Gastrointestinal, Neurological, Skin, HENT, Cardiovascular, Respiratory, Heme/Lymph Last edited by German Olam BRAVO, COT on 09/11/2024 12:19 PM.  ALLERGIES Allergies  Allergen Reactions   Trilyte [Peg 3350 -Kcl-Na Bicarb-Nacl]     HEMATEMESIS   Vicodin [Hydrocodone-Acetaminophen] Nausea And Vomiting   PAST MEDICAL HISTORY Past Medical History:  Diagnosis Date   Anxiety and depression    Cataract    Hypercholesterolemia    Hypertension    Hypertensive retinopathy    OU   Hypothyroidism    Macular degeneration    OD   Osteoarthritis    PONV (postoperative nausea and vomiting)    Seasonal allergies     Past Surgical History:  Procedure Laterality Date   BIOPSY  10/01/2018   Procedure: BIOPSY;  Surgeon: Harvey Margo CROME, MD;  Location: [facility];  Service: Endoscopy;;  gastric bx & gastric polyp   CATARACT EXTRACTION W/PHACO Right 12/23/2022   Procedure: CATARACT EXTRACTION PHACO AND INTRAOCULAR LENS PLACEMENT (IOC);  Surgeon: Juli Blunt, MD;  Location: [facility];  Service: Ophthalmology;  Laterality: Right;  CDE: 11.88   CATARACT EXTRACTION W/PHACO Left 02/24/2023   Procedure: CATARACT EXTRACTION PHACO AND INTRAOCULAR LENS PLACEMENT (IOC);  Surgeon: Juli Blunt, MD;  Location: [facility];  Service: Ophthalmology;  Laterality: Left;  CDE  5.82   COLONOSCOPY N/A 10/01/2018   Procedure: COLONOSCOPY;  Surgeon: Harvey Margo CROME, MD;  Location: [facility];  Service: Endoscopy;  Laterality: N/A;  10:30am   ESOPHAGOGASTRODUODENOSCOPY N/A 10/01/2018   Procedure: ESOPHAGOGASTRODUODENOSCOPY (EGD);  Surgeon: Harvey Margo CROME, MD;  Location: [facility];  Service: Endoscopy;  Laterality: N/A;   POLYPECTOMY  10/01/2018   Procedure: POLYPECTOMY;  Surgeon: Harvey Margo CROME, MD;  Location: [facility];  Service: Endoscopy;;  colon    TUBAL LIGATION     UTERINE FIBROID SURGERY     FAMILY HISTORY Family History  Problem Relation Age of Onset   Cancer Mother        lung   Stroke Father    Stroke Brother    Colon cancer Neg Hx    SOCIAL HISTORY Social History   Tobacco Use   Smoking status: Former    Current packs/day: 0.00    Average packs/day: 0.3 packs/day for 4.0 years (1.0 ttl pk-yrs)    Types: Cigarettes    Start date: 11/04/1960    Quit date: 11/04/1964    Years since quitting: 59.8   Smokeless tobacco: Never  Vaping Use   Vaping status: Never Used  Substance Use Topics   Alcohol  use: No   Drug use: No       OPHTHALMIC EXAM:  Base Eye Exam     Visual Acuity (Snellen - Linear)       Right Left   Dist [place] 20/40 20/30 -3   Dist ph [place] 20/NI 20/25 -1         Tonometry  (Tonopen, 12:27 PM)       Right Left   Pressure 10 12         Pupils       Dark Light Shape React APD   Right 3 2 Round Brisk None   Left 3 2 Round Brisk None         Visual Fields (Counting fingers)       Left Right    Full Full         Extraocular Movement       Right Left    Full, Ortho Full, Ortho         Neuro/Psych     Oriented x3: Yes   Mood/Affect: Normal  Dilation     Right eye: 1.0% Mydriacyl , 2.5% Phenylephrine  @ 12:27 PM           Slit Lamp and Fundus Exam     Slit Lamp Exam       Right Left   Lids/Lashes Dermatochalasis - upper lid Dermatochalasis - upper lid   Conjunctiva/Sclera White and quiet White and quiet   Cornea Arcus, well healed cataract wound, 1+ Punctate epithelial erosions, Debris in tear film Arcus, early nasal band K, 1+PEE, Debris in tear film, Well healed temporal cataract wound   Anterior Chamber Deep, 0.5+ fine cell / pigment Deep and clear   Iris Round and well dilated Round and reactive   Lens PC IOL in good position, open PC PC IOL in good postition, trace Posterior capsular opacification   Anterior Vitreous Vitreous syneresis, Posterior vitreous detachment, Weiss ring Vitreous syneresis         Fundus Exam       Right Left   Disc Pink and Sharp, Compact Pink and Sharp, Tilted disc   C/D Ratio 0.2 0.3   Macula Flat, blunted foveal reflex; trace shallow SRF, +drusen; +RPE mottling and clumping, small cluster of drusen and RPE mottling inferior fovea, no heme Flat, good foveal reflex, Retinal pigment epithelial mottling, superior para-foveal focal area of RPE atrophy -- stable, rare drusen, No heme or edema   Vessels attenuated, Tortuous Vascular attenuation, Tortuous   Periphery Attached, peripheral drusen / RPE changes nasally, peripheral cystoid degeneration Attached, very mild lattice at 0430, mild patch of Lattice degeneration at 0600 -- stable, scattered peripheral drusen, peripheral cystoid  degeneration           IMAGING AND PROCEDURES  Imaging and Procedures for 12/11/17  OCT, Retina - OU - Both Eyes       Right Eye Quality was good. Central Foveal Thickness: 245. Progression has been stable. Findings include no IRF, abnormal foveal contour, retinal drusen , pigment epithelial detachment, subretinal fluid, outer retinal atrophy (Trace shallow central/inferior SRF and SRHM).   Left Eye Quality was good. Central Foveal Thickness: 272. Progression has been stable. Findings include normal foveal contour, no IRF, no SRF, retinal drusen .   Notes *Images captured and stored on drive  Diagnosis / Impression:  OD: Trace shallow central/inferior SRF and SRHM OS: NFP, No IRF/SRF  Clinical management:  See below  Abbreviations: NFP - Normal foveal profile. CME - cystoid macular edema. PED - pigment epithelial detachment. IRF - intraretinal fluid. SRF - subretinal fluid. EZ - ellipsoid zone. ERM - epiretinal membrane. ORA - outer retinal atrophy. ORT - outer retinal tubulation. SRHM - subretinal hyper-reflective material       Intravitreal Injection, Pharmacologic Agent - OD - Right Eye       Time Out 09/11/2024. 1:00 PM. Confirmed correct patient, procedure, site, and patient consented.   Anesthesia Topical anesthesia was used. Anesthetic medications included Lidocaine  2%, Proparacaine 0.5%.   Procedure Preparation included 5% betadine  to ocular surface, eyelid speculum. A (30g) needle was used.   Injection: 8 mg aflibercept  8 MG/0.07ML   Route: Intravitreal, Site: Right Eye   NDC: R7037034, Lot: [id number], Expiration date: 06/28/2025, Waste: 0 mL   Post-op Post injection exam found visual acuity of at least counting fingers. The patient tolerated the procedure well. There were no complications. The patient received written and verbal post procedure care education. Post injection medications were not given.              ASSESSMENT/PLAN:  ICD-10-CM   1. Exudative age-related macular degeneration of right eye with active choroidal neovascularization (HCC)  H35.3211 OCT, Retina - OU - Both Eyes    Intravitreal Injection, Pharmacologic Agent - OD - Right Eye    aflibercept  (EYLEA  HD) ophthalmic injection 8 mg    2. 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 Essential hypertension  I10     4. Hypertensive retinopathy of both eyes  H35.033     5. 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 Posterior vitreous detachment of right eye  H43.811     7. Pseudophakia  Z96.1       1,2. Exudative age related macular degeneration vs CSCR OD  - FA (03.02.19) without leakage / pooling into area of SRF - review of systems reveals significant stressors in life, but no exogenous steroid use - S/P IVA #1 OD (09.27.19), #2 (10.25.19), #3 (11.22.19) -- minimal response  -- IVA resistance  =============================== - S/P IVE OD #1 (12.20.19), #2 (01.20.20), #3 (02.18.20), #4 (07.20.20) #5 (09.01.20), #6 (10.13.20), #7 (11.20.20), #8 (01.20.21), #9 (02.17.21), #10 (03.19.21), #11 (04.20.21), #12 (05.25.21), #13 (6.23.21), #14 (07.27.21), #15 (08.30.21), #16 (9.28.21), #17 (10.26.21), #18 (11.29.21), #19 (01.11.22), #20 (02.08.22), #21 (03.08.22), #22 (04.05.22), #23 (5.10.22), #24 (06.21.22), #25 (08.02.22), #26 (09.20.22), #27 (11.15.22), #28 (01.17.23), #29 (03.29.23), #30 (06.21.23), #31 (09.27.23), #32 (01.16.24), #33 (05.07.24), #34 (06.18.24), #35 (07.30.24)  -- IVE resistance =============== - s/p IVV OD #1 (09.03.24), #2 (10.03.24), #3 (11.06.24), #4 (12.04.24) #5 (01.02.25) #6 (01.31.25 sample), #7 (sample 02.28.25), #8 (04.02.25), #9 (05.02.25)  -- IVV resistance =============== - s/p IVE HD #1 (06.04.25), #2 (07.02.25), #3 (07.30.25), #4 (09.25.25), #5 (11.19.25)  - lost to f/u from Feb 2020 to July 2020 due to COVID-19 concerns - today, BCVA OD 20/40 from 20/50 - OCT shows Trace shallow  central/inferior SRF and SRHM at 8 wks - recommend IVE HD OD #6 today, 01.14.26 -- with follow up in 7-8 wks  - pt wishes to proceed with IVE HD OD   - RBA of procedure discussed, questions answered  - informed consent obtained   - IVE HD informed consent form signed and scanned on 6.04.25  - see procedure note  - Good Days approved for 2026  - f/u 7-8 weeks -- DFE/OCT, possible injection  3,4. Hypertensive retinopathy OU  - discussed importance of tight BP control  - stable  - monitor  5. Lattice degeneration OS  - patch of inferior lattice OS -- no RT, holes or SRF  - asymptomatic  - discussed findings, prognosis, and treatment options including observation  - monitor  6. PVD / vitreous syneresis OD-   - Discussed findings and prognosis  - No RT or RD on 360 peripheral exam  - Reviewed s/s of RT/RD  - strict return precautions for any such RT/RD symptoms  7. Pseudophakia OU  - s/p CE/IOL OD (Dr. Juli, 04.26.24, OS 02/24/23)  - IOL in good position, doing well  - monitor  Ophthalmic Meds Ordered this visit:  Meds ordered this encounter  Medications   aflibercept  (EYLEA  HD) ophthalmic injection 8 mg     Return for 7-8 weeks exu ARMD OD, DFE, OCT, poss IVE HD OD.  There are no Patient Instructions on file for this visit.   This document serves as a record of services personally performed by Redell JUDITHANN Hans, MD, PhD. It was created on their behalf by Almetta Pesa, an ophthalmic  technician. The creation of this record is the provider's dictation and/or activities during the visit.    Electronically signed by: Almetta Pesa, OA, 09/11/24  11:20 PM   Redell JUDITHANN Hans, M.D., Ph.D. Diseases & Surgery of the Retina and Vitreous [facility]  I have reviewed the above documentation for accuracy and completeness, and I agree with the above. Redell JUDITHANN Hans, M.D., Ph.D. 09/11/24 11:21 PM   Abbreviations: M myopia (nearsighted); A astigmatism; H  hyperopia (farsighted); P presbyopia; Mrx spectacle prescription;  CTL contact lenses; OD right eye; OS left eye; OU both eyes  XT exotropia; ET esotropia; PEK punctate epithelial keratitis; PEE punctate epithelial erosions; DES dry eye syndrome; MGD meibomian gland dysfunction; ATs artificial tears; PFAT's preservative free artificial tears; NSC nuclear sclerotic cataract; PSC posterior subcapsular cataract; ERM epi-retinal membrane; PVD posterior vitreous detachment; RD retinal detachment; DM diabetes mellitus; DR diabetic retinopathy; NPDR non-proliferative diabetic retinopathy; PDR proliferative diabetic retinopathy; CSME clinically significant macular edema; DME diabetic macular edema; dbh dot blot hemorrhages; CWS cotton wool spot; POAG primary open angle glaucoma; C/D cup-to-disc ratio; HVF humphrey visual field; GVF goldmann visual field; OCT optical coherence tomography; IOP intraocular pressure; BRVO Branch retinal vein occlusion; CRVO central retinal vein occlusion; CRAO central retinal artery occlusion; BRAO branch retinal artery occlusion; RT retinal tear; SB scleral buckle; PPV pars plana vitrectomy; VH Vitreous hemorrhage; PRP panretinal laser photocoagulation; IVK intravitreal kenalog; VMT vitreomacular traction; MH Macular hole;  NVD neovascularization of the disc; NVE neovascularization elsewhere; AREDS age related eye disease study; ARMD age related macular degeneration; POAG primary open angle glaucoma; EBMD epithelial/anterior basement membrane dystrophy; ACIOL anterior chamber intraocular lens; IOL intraocular lens; PCIOL posterior chamber intraocular lens; Phaco/IOL phacoemulsification with intraocular lens placement; PRK photorefractive keratectomy; LASIK laser assisted in situ keratomileusis; HTN hypertension; DM diabetes mellitus; COPD chronic obstructive pulmonary disease "

## 2024-09-11 ENCOUNTER — Ambulatory Visit (INDEPENDENT_AMBULATORY_CARE_PROVIDER_SITE_OTHER): Admitting: Ophthalmology

## 2024-09-11 ENCOUNTER — Encounter (INDEPENDENT_AMBULATORY_CARE_PROVIDER_SITE_OTHER): Payer: Self-pay | Admitting: Ophthalmology

## 2024-09-11 DIAGNOSIS — I10 Essential (primary) hypertension: Secondary | ICD-10-CM

## 2024-09-11 DIAGNOSIS — H35033 Hypertensive retinopathy, bilateral: Secondary | ICD-10-CM

## 2024-09-11 DIAGNOSIS — H35711 Central serous chorioretinopathy, right eye: Secondary | ICD-10-CM | POA: Diagnosis not present

## 2024-09-11 DIAGNOSIS — H353211 Exudative age-related macular degeneration, right eye, with active choroidal neovascularization: Secondary | ICD-10-CM | POA: Diagnosis not present

## 2024-09-11 DIAGNOSIS — Z961 Presence of intraocular lens: Secondary | ICD-10-CM | POA: Diagnosis not present

## 2024-09-11 DIAGNOSIS — H35412 Lattice degeneration of retina, left eye: Secondary | ICD-10-CM

## 2024-09-11 DIAGNOSIS — H43811 Vitreous degeneration, right eye: Secondary | ICD-10-CM | POA: Diagnosis not present

## 2024-09-11 MED ORDER — AFLIBERCEPT 8 MG/0.07ML IZ SOLN
8.0000 mg | INTRAVITREAL | Status: AC | PRN
  Administered 2024-09-11: 8 mg via INTRAVITREAL

## 2024-10-30 ENCOUNTER — Encounter (INDEPENDENT_AMBULATORY_CARE_PROVIDER_SITE_OTHER): Admitting: Ophthalmology
